# Patient Record
Sex: Male | Born: 1985 | Race: Black or African American | Hispanic: No | Marital: Single | Smoking: Never smoker
Health system: Southern US, Community
[De-identification: ages and names within clinical notes are randomized; demographics above are authoritative.]

## PROBLEM LIST (undated history)

## (undated) DIAGNOSIS — G4733 Obstructive sleep apnea (adult) (pediatric): Secondary | ICD-10-CM

## (undated) DIAGNOSIS — I5041 Acute combined systolic (congestive) and diastolic (congestive) heart failure: Secondary | ICD-10-CM

## (undated) DIAGNOSIS — E119 Type 2 diabetes mellitus without complications: Secondary | ICD-10-CM

## (undated) DIAGNOSIS — I10 Essential (primary) hypertension: Secondary | ICD-10-CM

## (undated) DIAGNOSIS — I5031 Acute diastolic (congestive) heart failure: Secondary | ICD-10-CM

## (undated) HISTORY — DX: Acute combined systolic (congestive) and diastolic (congestive) heart failure: I50.41

## (undated) HISTORY — PX: NO PAST SURGERIES: SHX2092

## (undated) HISTORY — DX: Acute diastolic (congestive) heart failure: I50.31

## (undated) HISTORY — DX: Obstructive sleep apnea (adult) (pediatric): G47.33

## (undated) HISTORY — DX: Morbid (severe) obesity due to excess calories: E66.01

---
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
## 2006-12-13 ENCOUNTER — Inpatient Hospital Stay (HOSPITAL_COMMUNITY): Admission: EM | Admit: 2006-12-13 | Discharge: 2006-12-14 | Payer: Self-pay | Admitting: Emergency Medicine

## 2010-09-11 ENCOUNTER — Encounter: Payer: Self-pay | Admitting: Family Medicine

## 2011-01-07 NOTE — Consult Note (Signed)
NAME:  Paul, Lamb NO.:  [id number]   MEDICAL RECORD NO.:  [id number]          PATIENT TYPE:  EMS   LOCATION:  MAJO                         FACILITY:  [facility]   PHYSICIAN:  Paul Lamb, M.D.  DATE OF BIRTH:  [birth year]   DATE OF CONSULTATION:  12/13/2006  DATE OF DISCHARGE:                                 CONSULTATION   REFERRING PHYSICIAN:  Markham Jordan L. Effie Shy, M.D.   HISTORY OF PRESENT ILLNESS:  Paul Lamb is a 25 year old right-  handed black male, born October 24, 1985, with a history of  hypertension in the past, treated 2 years ago, but never continued the  medication.  This patient was brought into the emergency room today for  onset of right-sided facial weakness that he noticed this morning.  The  patient noted 2 days ago some right ear pain and the day prior to  admission, had some alteration in taste.  The patient noted this morning  that he could not whistle and when he looked in the mirror, noted that  this face was asymmetric.  Patient comes to the hospital for further  evaluation, but blood pressures were noted to be greater than 200.  The  patient is being admitted for high blood pressure control.  Neurology  was called for Bell's palsy.  CT of the head was unremarkable.   PAST MEDICAL HISTORY:  1. History of severe hypertension.  2. Bell's palsy on the right.  3. Obesity.   MEDICATIONS:  The patient is on no medications.   ALLERGIES:  There are no known allergies.   HABITS:  He does not smoke or drink.   SOCIAL HISTORY:  [place], is single and is in college.  The patient has no children.   FAMILY MEDICAL HISTORY:  Father has severe early onset hypertension, on  hemodialysis with end-stage renal disease.  Mother has a history of  diabetes.  There is a history of heart disease on the father's side of  the family.   REVIEW OF SYSTEMS:  Notable for no fevers or chills.  The  patient denies  headache other than ear pain.  Denies any problems swallowing or  choking.  Denies neck pain, shortness of breath, chest pains, abdominal  pains, troubles with nausea or vomiting.  Denies any problems  controlling the bowels or bladder.  Denies any numbness or weakness on  the arms or legs.  Has not had any gait disturbance.  No dizziness or  blackout episodes.   PHYSICAL EXAMINATION:  VITALS:  Blood pressure is 186/108, heart rate  81, respiratory rate 20, temperature 99.6.  GENERAL:  This patient is a moderately obese black male who is alert and  cooperative at the time examination.  HEENT:  Head is atraumatic.  Eyes:  Pupils are equal, round and reactive  to light.  Disks are flat bilaterally.  Extraocular movements are full.  Visual fields are full.  Speech is well-enunciated and not aphasic.  CHEST:  The patient has clear lung fields.  CARDIOVASCULAR:  Examination reveals  a regular rate and rhythm with no  obvious murmurs or rubs noted.  EXTREMITIES:  Without significant edema.  NEUROLOGIC:  The patient appears to have some facial asymmetry,  decreased symmetry with smile, flattened nasolabial fold on the right.  The patient has weakness of the forehead muscles as well on the right,  has delayed eyelid closure with eye blink on the right.  Again,  extraocular movements are completely full.  No aphasia noted.  Tympanic  membranes are clear bilaterally.  Motor testing reveals 5/5 strength in  all 4's.  Good and symmetric motor and tone are noted throughout.  Sensory testing is intact to pinprick, soft touch and vibratory  sensation throughout.  Pinprick sensation on the face is symmetric and  normal.  Tongue strength is normal bilaterally.  Jaw strength is normal.  The patient has good finger-to-nose-to-finger and heel-to-shin.  Gait  was not tested.  Deep tendon reflexes are symmetric and normal.  Toes  are downgoing bilaterally.  No drift is seen.   LABORATORY  VALUES:  Values are notable for a urinalysis revealing a  specific gravity of 1.023, pH of 7.0, protein of the urine is 30 mg/dL,  0-2 white cells, 0-2 red cells; white count of 6.9, hemoglobin 15.9,  hematocrit 47.3, MCV of 71.3, platelets of 281,000.   IMAGING STUDY:  CT of the head is as above.   IMPRESSION:  1. Right Bell's palsy.  2. Hypertension.   This patient comes to the emergency room for the right Bell's palsy, but  is incidentally noted to have significant hypertension; I do not suspect  the 2 are related.  No specific treatment for the Bell's palsy is  recommended other than possibly using Valtrex at 500 mg q.12 h. for 5  days.  I would not use steroids.  I will be happy to see this patient  back at any time if needed.   Thank you very much.      Paul Lamb, M.D.  Electronically Signed     CKW/MEDQ  D:  12/13/2006  T:  12/14/2006  Job:  [id number]   cc:   [address], [place], [place] [facility]

## 2011-01-07 NOTE — H&P (Signed)
NAME:  Paul Lamb, Paul Lamb NO.:  [id number]   MEDICAL RECORD NO.:  [id number]          PATIENT TYPE:  EMS   LOCATION:  MAJO                         FACILITY:  [facility]   PHYSICIAN:  Michaelyn Barter, M.D. DATE OF BIRTH:  June 10, 1986   DATE OF ADMISSION:  12/13/2006  DATE OF DISCHARGE:                              HISTORY & PHYSICAL   PRIMARY CARE PHYSICIAN:  Dr. Renaye Rakers.   CHIEF COMPLAINT:  Right facial problem.   HISTORY OF PRESENT ILLNESS:  Mr. Covin is a 25 year old gentleman who  states that at approximately 8:00 this morning he developed problems  with moving the right side of his face. He states that he had  difficulties with regards to keeping both food and liquids inside his  mouth. Everything that he would take into his oral cavity would leak out  at the right side of his face. He states that some time later at  approximately 1:00 a.m. he went to have his blood pressure taken at a  local fire station. His blood pressure then was found to be 210/130. He  was told to come to the hospital for further evaluation. He complains  that his right ear feels watery. He also complained of a right-sided  earache that started a day or two ago. He denies having any visual  changes, nausea, vomiting, fevers or chills. No shortness of breath, no  chest pain. He states that he has never had any similar symptoms in the  past.   PAST MEDICAL HISTORY:  Questionable hypertension. The patient states  that he was told by his primary care doctor in the past that he may be  developing early hypertension.   PAST SURGICAL HISTORY:  Ingrown toenail removed from the left foot.   ALLERGIES:  None.   HOME MEDICATIONS:  None.   SOCIAL HISTORY:  The patient attends [employer]. Cigarettes never. Alcohol  never. Cocaine never.   FAMILY HISTORY:  Father has end-stage renal disease secondary to  hypertension and he is now on dialysis. His end-stage renal disease was  diagnosed at the age  of 5. Mother has issues with her blood pressure.  The father cannot remember the specifics. She also suffers from diabetes  mellitus.   REVIEW OF SYSTEMS:  As per HPI.   PHYSICAL EXAMINATION:  GENERAL:  The patient is awake, he is  cooperative. He does not demonstrated any obvious distress. He is a  morbidly obese appearing male.  HEENT:  Normocephalic, atraumatic. There is a slight right-sided facial  droop. Oral mucosa is pink, no thrush, no exudate.  VITAL SIGNS:  When the patient initially presented to the hospital his  temperature was 99.6, his blood pressure 125/89, heart rate 81,  respirations 20, O2 sat 98%. Approximately 20 minutes later, the  patient's blood pressure was noted to be 186/108.  NECK:  Supple, no JVD, no lymphadenopathy. Thyroid not palpable. Strong  carotid upstrokes bilaterally, no bruits are auscultated.  CARDIAC:  S1, S2 is present, regular rate and rhythm. No S3, no S4, no  murmur, gallop or rub.  RESPIRATORY:  No crackles or  wheezes.  ABDOMEN:  Soft, nontender, nondistended, positive bowel sounds. No  masses are palpated.  EXTREMITIES:  No leg edema.  NEUROLOGIC:  Alert and oriented x3. Cranial nerves II-XII are intact.  The patient does have slight right-sided droop at the corner of his  right mouth. When the patient smiles he has difficulty with regards to  raising the right side of his mouth, likewise the patient has decreased  extraocular strength with regards to his right eye. There is no focal  motor deficits with regards to the upper or lower extremities. The  patient has equal hand grip strength bilaterally. There is no pronator  drift.  MUSCULOSKELETAL:  5/5 upper and lower extremity strength.   LABORATORY DATA:  White blood cell count is 6.9, hemoglobin 15.9,  hematocrit 47.3, platelets 281. Total protein is 8.3, albumin 4.7,  calcium 9.9. PT 12.6, INR 0.9, PTT 28. Sodium 140, potassium 4.0,  chloride 103, CO2 26, BUN 6, creatinine 0.81,  glucose 87. Bilirubin  total 1.1, alkaline phosphatase 104. SGOT 30, SGPT 31, urinalysis  negative.   CT scan of the head was interpreted as being negative.   ASSESSMENT/PLAN:  1. Acute onset right-sided facial weakness. Given the fact that the      patient's head CT was negative, the fact that the patient is also a      very young gentleman, it appears that the patient may currently be      suffering from an acute onset of Bell's palsy. Neurology has seen      the patient and at this particular time the recommendation is to      start the patient on Valtrex. Will comply and start Valtrex as well      as prednisone.  2. Uncontrolled hypertension. Will start the patient on Norvasc for      now. Will titrate the dose up as needed. Will consider additional      blood pressure medications if the patient's blood pressure remains      uncontrolled.  3. GI prophylaxis. Will provide Protonix.      Michaelyn Barter, M.D.  Electronically Signed     OR/MEDQ  D:  12/13/2006  T:  12/13/2006  Job:  [id number]   cc:   Renaye Rakers, M.D.

## 2011-01-07 NOTE — Discharge Summary (Signed)
NAME:  SAVIER, TRICKETT NO.:  [id number]   MEDICAL RECORD NO.:  [id number]          PATIENT TYPE:  INP   LOCATION:  3015                         FACILITY:  [facility]   PHYSICIAN:  Michaelyn Barter, M.D. DATE OF BIRTH:  1985-12-19   DATE OF ADMISSION:  12/13/2006  DATE OF DISCHARGE:  12/14/2006                               DISCHARGE SUMMARY   PRIMARY CARE PHYSICIAN:  Renaye Rakers, M.D.   FINAL DIAGNOSES:  1. Right facial weakness secondary to Bell's palsy.  2. Very uncontrolled hypertension/hypertensive urgency.   CONSULTATIONS:  Marlan Palau, M.D. with neurology.   PROCEDURE:  CT scan of the head without contrast completed December 13, 2006.   HISTORY OF PRESENT ILLNESS:  Mr. Villamar is a 25 year old male who stated  that at approximately 8 o'clock in the morning he developed problems  moving the right side of his face.  Later that day, he went to have his  blood pressure checked and it was found to be 210/130.  He was told to  report to the hospital's emergency department for further evaluation  right away.   For past medical history and further details of the history and  physical, please see that dictated by Dr. Michaelyn Barter on December 13, 2006.   HOSPITAL COURSE:  Problem 1.  Right facial weakness/Bell's palsy.  A CAT  scan was completed of the patient's head without contrast on December 13, 2006. It was a negative noncontrast head CT.  There was no evidence of  intracranial hemorrhage, brain edema, acute infarct, or lesions.  Neurology was consulted.  Dr. Lesia Sago saw the patient.  He  indicated that the patient was suffering from right facial Bell's palsy  and that although the patient's blood pressure was significantly  elevated, there was no obvious relationship.  The patient was  subsequently started on Valtrex 500 mg q.12h.  He recommended that  steroids not be used on this patient.   Problem 2.  Uncontrolled hypertension.  The patient was  vague with  regards to whether or not he had ever been diagnosed with having high  blood pressure in the past.  He was not taking any medications for his  blood pressure prior to this hospitalization.  He was started on IV  labetalol on a p.r.n. basis along with Norvasc, initially 5 mg p.o.  daily.  His blood pressure remained somewhat labile.  He would have  blood pressure that ranged anywhere from the 120s systolically up to the  180s systolically, and likewise, his blood pressure diastolically went  from normotensive to hypertensive.  I indicated to the patient that he  would have to follow up with his primary care doctor for further control  and adjustments of his blood pressure medication.  On the date of  discharge the patient currently has no complaints, and he indicates that  he wants to go home. His vitals show that his temperature is 97.7, heart  rate 86, respirations 20, blood pressure 176/99, O2 saturation 95% on  room air.  The decision has been made to discharge the  patient home.   DISCHARGE MEDICATIONS:  1. Norvasc 10 mg p.o. daily.  2. Valtrex 500 mg one tablet p.o. b.i.d.   FOLLOW UP:  The patient will be instructed to follow up with Dr. Renaye Rakers on Dec 21, 2006 at 4:30 p.m.  I have called to confirm this  appointment.      Michaelyn Barter, M.D.  Electronically Signed     OR/MEDQ  D:  12/14/2006  T:  12/14/2006  Job:  [id number]   cc:   Renaye Rakers, M.D.

## 2011-05-24 ENCOUNTER — Emergency Department (HOSPITAL_COMMUNITY)
Admission: EM | Admit: 2011-05-24 | Discharge: 2011-05-25 | Disposition: A | Discharge status: Home / Self Care | Payer: No Typology Code available for payment source | Attending: Emergency Medicine | Admitting: Emergency Medicine

## 2011-05-24 DIAGNOSIS — I10 Essential (primary) hypertension: Secondary | ICD-10-CM | POA: Insufficient documentation

## 2011-05-24 DIAGNOSIS — T148XXA Other injury of unspecified body region, initial encounter: Secondary | ICD-10-CM | POA: Insufficient documentation

## 2011-05-24 DIAGNOSIS — T1490XA Injury, unspecified, initial encounter: Secondary | ICD-10-CM | POA: Insufficient documentation

## 2011-05-24 DIAGNOSIS — R51 Headache: Secondary | ICD-10-CM | POA: Insufficient documentation

## 2011-05-24 DIAGNOSIS — Y92410 Unspecified street and highway as the place of occurrence of the external cause: Secondary | ICD-10-CM | POA: Insufficient documentation

## 2011-09-28 ENCOUNTER — Ambulatory Visit: Payer: Self-pay

## 2011-09-29 ENCOUNTER — Ambulatory Visit: Payer: Self-pay | Admitting: Emergency Medicine

## 2011-09-29 VITALS — BP 152/110 | HR 84 | Temp 98.4°F | Resp 22 | Ht 72.0 in | Wt 348.2 lb

## 2011-09-29 DIAGNOSIS — Z0000 Encounter for general adult medical examination without abnormal findings: Secondary | ICD-10-CM

## 2011-09-29 DIAGNOSIS — Z0289 Encounter for other administrative examinations: Secondary | ICD-10-CM

## 2011-09-29 NOTE — Progress Notes (Signed)
  Subjective:    Patient ID: Paul Lamb, male    DOB: 25-Oct-1985, 25 y.o.   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    Review of Systems review of systems was positive for symptoms of sleep apnea with severe snoring and daytime fatigue and drowsiness.     Objective:   Physical Exam his general exam reveals a morbidly obese male in no acute distress. HEENT exam is within normal limits neck supple chest is clear heart regular rate no murmurs abdomen soft no tenderness. 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 His blood pressure is not under control he has signs and symptoms consistent with obstructive sleep apnea and has not had a sleep study done. He was given numbers for vocational rehabilitation and health services if he might be able to obtain a sleep study

## 2011-10-21 ENCOUNTER — Telehealth: Payer: Self-pay

## 2011-10-21 NOTE — Telephone Encounter (Signed)
.  [facility] PT WOULD LIKE TO GET A REFERRAL TO [facility] PLEASE CALL [phone]

## 2011-10-23 NOTE — Telephone Encounter (Signed)
PT STATED HE WOULD LIKE TO BE REFERRED TO [facility] FOR SLEEP STUDY.  PT STATED DR DAUB SPOKE ABOUT THIS LAST VISIT.

## 2011-10-23 NOTE — Telephone Encounter (Signed)
Patient needs office visit and exam to determine appropriate study to be ordered.

## 2011-10-24 ENCOUNTER — Other Ambulatory Visit: Payer: Self-pay | Admitting: Emergency Medicine

## 2011-10-24 DIAGNOSIS — Z9989 Dependence on other enabling machines and devices: Secondary | ICD-10-CM

## 2015-06-22 ENCOUNTER — Emergency Department (HOSPITAL_COMMUNITY)
Admission: EM | Admit: 2015-06-22 | Discharge: 2015-06-22 | Disposition: A | Discharge status: Home / Self Care | Payer: Self-pay | Attending: Emergency Medicine | Admitting: Emergency Medicine

## 2015-06-22 ENCOUNTER — Encounter (HOSPITAL_COMMUNITY): Payer: Self-pay | Admitting: Emergency Medicine

## 2015-06-22 DIAGNOSIS — S3992XA Unspecified injury of lower back, initial encounter: Secondary | ICD-10-CM | POA: Insufficient documentation

## 2015-06-22 DIAGNOSIS — Y92410 Unspecified street and highway as the place of occurrence of the external cause: Secondary | ICD-10-CM | POA: Insufficient documentation

## 2015-06-22 DIAGNOSIS — M545 Low back pain, unspecified: Secondary | ICD-10-CM

## 2015-06-22 DIAGNOSIS — I10 Essential (primary) hypertension: Secondary | ICD-10-CM | POA: Insufficient documentation

## 2015-06-22 DIAGNOSIS — Z79899 Other long term (current) drug therapy: Secondary | ICD-10-CM | POA: Insufficient documentation

## 2015-06-22 DIAGNOSIS — Y998 Other external cause status: Secondary | ICD-10-CM | POA: Insufficient documentation

## 2015-06-22 DIAGNOSIS — Y9389 Activity, other specified: Secondary | ICD-10-CM | POA: Insufficient documentation

## 2015-06-22 HISTORY — DX: Essential (primary) hypertension: I10

## 2015-06-22 MED ORDER — ACETAMINOPHEN 500 MG PO TABS
1000.0000 mg | ORAL_TABLET | Freq: Once | ORAL | Status: AC
  Administered 2015-06-22: 1000 mg via ORAL
  Filled 2015-06-22: qty 2

## 2015-06-22 MED ORDER — TRAMADOL HCL 50 MG PO TABS: 50.0000 mg | ORAL_TABLET | Freq: Four times a day (QID) | ORAL | Status: DC | PRN

## 2015-06-22 NOTE — Discharge Instructions (Signed)
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
## 2015-09-04 ENCOUNTER — Emergency Department (HOSPITAL_COMMUNITY)
Admission: EM | Admit: 2015-09-04 | Discharge: 2015-09-04 | Disposition: A | Discharge status: Home / Self Care | Payer: Self-pay | Attending: Emergency Medicine | Admitting: Emergency Medicine

## 2015-09-04 ENCOUNTER — Encounter (HOSPITAL_COMMUNITY): Payer: Self-pay | Admitting: Family Medicine

## 2015-09-04 DIAGNOSIS — Z760 Encounter for issue of repeat prescription: Secondary | ICD-10-CM | POA: Insufficient documentation

## 2015-09-04 DIAGNOSIS — Z79899 Other long term (current) drug therapy: Secondary | ICD-10-CM | POA: Insufficient documentation

## 2015-09-04 DIAGNOSIS — R7989 Other specified abnormal findings of blood chemistry: Secondary | ICD-10-CM | POA: Insufficient documentation

## 2015-09-04 DIAGNOSIS — Z0189 Encounter for other specified special examinations: Secondary | ICD-10-CM

## 2015-09-04 DIAGNOSIS — E669 Obesity, unspecified: Secondary | ICD-10-CM | POA: Insufficient documentation

## 2015-09-04 DIAGNOSIS — I10 Essential (primary) hypertension: Secondary | ICD-10-CM | POA: Insufficient documentation

## 2015-09-04 LAB — COMPREHENSIVE METABOLIC PANEL
ALT: 21 U/L (ref 17–63)
AST: 27 U/L (ref 15–41)
Albumin: 3.7 g/dL (ref 3.5–5.0)
Alkaline Phosphatase: 75 U/L (ref 38–126)
Anion gap: 9 (ref 5–15)
BUN: 11 mg/dL (ref 6–20)
CHLORIDE: 104 mmol/L (ref 101–111)
CO2: 26 mmol/L (ref 22–32)
CREATININE: 1.06 mg/dL (ref 0.61–1.24)
Calcium: 9.6 mg/dL (ref 8.9–10.3)
Glucose, Bld: 115 mg/dL — ABNORMAL HIGH (ref 65–99)
POTASSIUM: 4.4 mmol/L (ref 3.5–5.1)
SODIUM: 139 mmol/L (ref 135–145)
Total Bilirubin: 1 mg/dL (ref 0.3–1.2)
Total Protein: 6.9 g/dL (ref 6.5–8.1)

## 2015-09-04 MED ORDER — AMLODIPINE BESYLATE 5 MG PO TABS
10.0000 mg | ORAL_TABLET | Freq: Once | ORAL | Status: AC
  Administered 2015-09-04: 10 mg via ORAL
  Filled 2015-09-04: qty 2

## 2015-09-04 MED ORDER — LISINOPRIL 20 MG PO TABS: 20.0000 mg | ORAL_TABLET | Freq: Every day | ORAL | Status: DC

## 2015-09-04 MED ORDER — LOSARTAN POTASSIUM-HCTZ 100-12.5 MG PO TABS: 1.0000 | ORAL_TABLET | Freq: Every day | ORAL | Status: DC

## 2015-09-04 MED ORDER — LABETALOL HCL 100 MG PO TABS: 100.0000 mg | ORAL_TABLET | Freq: Two times a day (BID) | ORAL | Status: DC

## 2015-09-04 MED ORDER — AMLODIPINE BESYLATE 10 MG PO TABS: 10.0000 mg | ORAL_TABLET | Freq: Every day | ORAL | Status: DC

## 2015-09-04 NOTE — ED Notes (Signed)
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 sts that he had some protein in his urine and they want to make sure its from his BP and not impaired kidney function. sts he feels okay.

## 2015-09-04 NOTE — ED Provider Notes (Signed)
CSN: [id number]     Arrival date & time 09/04/15  1607 History  By signing my name below, I, Paul Lamb, attest that this documentation has been prepared under the direction and in the presence of Paul Lamb, [place]. Electronically Signed: Placido Lamb, ED Scribe. 09/04/2015. 7:35 PM.    Chief Complaint  Patient presents with  . needs kidney function test    The history is provided by the patient. No language interpreter was used.    HPI Comments: Paul Lamb is a 30 y.o. male who presents to the Emergency Department for a kidney function test. 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 Pt also requests an rx to have his HTN medications refilled further noting that he has not been taking his Norvasc recently due to having ran out of it. He denies any CP, SOB, hematuria, difficulty urinating, visual disturbance, headache, or any other symptoms at this time.    Past Medical History  Diagnosis Date  . Hypertension    History reviewed. No pertinent past surgical history. History reviewed. No pertinent family history. Social History  Substance Use Topics  . Smoking status: Never Smoker   . Smokeless tobacco: Never Used  . Alcohol Use: No    Review of Systems  All other systems reviewed and are negative.   Allergies  Review of patient's allergies indicates no known allergies.  Home Medications   Prior to Admission medications   Medication Sig Start Date End Date Taking? Authorizing Provider  amLODipine (NORVASC) 10 MG tablet Take 10 mg by mouth daily.    Historical Provider, MD  losartan-hydrochlorothiazide (HYZAAR) 100-12.5 MG per tablet Take 1 tablet by mouth daily.    Historical Provider, MD  traMADol (ULTRAM) 50 MG tablet Take 1 tablet (50 mg total) by mouth every 6 (six) hours as needed. 06/22/15   Eber Hong, MD   BP 160/105 mmHg  Pulse 94   Temp(Src) 98.6 F (37 C) (Oral)  Resp 14  SpO2 97%    Physical Exam  Constitutional: He is oriented to person, place, and time. He appears well-developed and well-nourished.  Obese, NAD  HENT:  Head: Normocephalic and atraumatic.  Neck: Normal range of motion.  Cardiovascular: Normal rate.   Pulmonary/Chest: Effort normal. No respiratory distress.  Abdominal: Soft.  Musculoskeletal: Normal range of motion.  Neurological: He is alert and oriented to person, place, and time.  Skin: Skin is warm and dry. He is not diaphoretic.  Psychiatric: He has a normal mood and affect. His behavior is normal.  Nursing note and vitals reviewed.  Filed Vitals:   09/04/15 1628 09/04/15 1922 09/04/15 1925 09/04/15 2029  BP: 160/105 185/113 200/105 168/101  Pulse: 94 95  90  Temp: 98.6 F (37 C) 97.9 F (36.6 C)    TempSrc: Oral Oral    Resp: SpO2: 97% 98%  96%     ED Course  Procedures  DIAGNOSTIC STUDIES: Oxygen Saturation is 97% on RA, normal by my interpretation.    COORDINATION OF CARE: 7:33 PM Discussed next steps with pt and he agreed to the plan.   Labs Review Labs Reviewed  COMPREHENSIVE METABOLIC PANEL - Abnormal; Notable for the following:    Glucose, Bld 115 (*)    All other components within normal limits    Imaging Review No results found.  I have personally reviewed and evaluated these lab results as part of my medical decision-making.   EKG Interpretation None      MDM   Final diagnoses:  Encounter for laboratory test  Essential hypertension  Encounter for medication refill    CMP unremarkable. Pt states he just needed note saying he does not have evidence of kidney disease. However his BP has been in elevated in the ED, which I suspect is secondary to running out of Norvasc at home. He states he otherwise has taken his medications as prescribed. Will give home dose of norvasc here and recheck. HE is otherwise asymptomatic. Anticipate d/c home  with rx for all his BP meds per his request. Will give resource guide to establish regular primary care.  BP improved with medication. Will d/c as above.  I personally performed the services described in this documentation, which was scribed in my presence. The recorded information has been reviewed and is accurate.   Carlene Coria, PA-C 09/04/15 2303  Marily Memos, MD 09/07/15 [phone]

## 2015-09-04 NOTE — Discharge Instructions (Signed)
Your bloodwork was normal today. Please continue taking all of your blood pressure medications as prescribed as your blood pressure was high in the emergency room. You should call one of the clinics listed below to establish primary care to get your blood pressure re-checked and for ongoing blood pressure management. Return to the ER for new or worsening symptoms.  Take medications as prescribed. Return to the emergency room for worsening condition or new concerning symptoms. Follow up with your regular doctor. If you don't have a regular doctor use one of the numbers below to establish a primary care doctor.   Emergency Department Resource Guide 1) Find a Doctor and Pay Out of Pocket Although you won't have to find out who is covered by your insurance plan, it is a good idea to ask around and get recommendations. You will then need to call the office and see if the doctor you have chosen will accept you as a new patient and what types of options they offer for patients who are self-pay. Some doctors offer discounts or will set up payment plans for their patients who do not have insurance, but you will need to ask so you aren't surprised when you get to your appointment.  2) Contact Your Local Health Department Not all health departments have doctors that can see patients for sick visits, but many do, so it is worth a call to see if yours does. If you don't know where your local health department is, you can check in your phone book. The CDC also has a tool to help you locate your state's health department, and many state websites also have listings of all of their local health departments.  3) Find a Walk-in Clinic If your illness is not likely to be very severe or complicated, you may want to try a walk in clinic. These are popping up all over the country in pharmacies, drugstores, and shopping centers. They're usually staffed by nurse practitioners or physician assistants that have been trained to  treat common illnesses and complaints. They're usually fairly quick and inexpensive. However, if you have serious medical issues or chronic medical problems, these are probably not your best option.  No Primary Care Doctor: - Call Health Connect at  [phone] - they can help you locate a primary care doctor that  accepts your insurance, provides certain services, etc. - Physician Referral [phone]  Emergency Department Resource Guide 1) Find a Doctor and Pay Out of Pocket Although you won't have to find out who is covered by your insurance plan, it is a good idea to ask around and get recommendations. You will then need to call the office and see if the doctor you have chosen will accept you as a new patient and what types of options they offer for patients who are self-pay. Some doctors offer discounts or will set up payment plans for their patients who do not have insurance, but you will need to ask so you aren't surprised when you get to your appointment.  2) Contact Your Local Health Department Not all health departments have doctors that can see patients for sick visits, but many do, so it is worth a call to see if yours does. If you don't know where your local health department is, you can check in your phone book. The CDC also has a tool to help you locate your state's health department, and many state websites also have listings of all of their local health departments.  3)  Find a Walk-in Clinic If your illness is not likely to be very severe or complicated, you may want to try a walk in clinic. These are popping up all over the country in pharmacies, drugstores, and shopping centers. They're usually staffed by nurse practitioners or physician assistants that have been trained to treat common illnesses and complaints. They're usually fairly quick and inexpensive. However, if you have serious medical issues or chronic medical problems, these are probably not your best option.  No  Primary Care Doctor: - Call Health Connect at  [phone] - they can help you locate a primary care doctor that  accepts your insurance, provides certain services, etc. - Physician Referral Service- [phone]  Chronic Pain Problems: Organization         Address  Phone   Notes  [facility]  [phone] Patients need to be referred by their primary care doctor.   Medication Assistance: Organization         Address  Phone   Notes  [facility] Medication [facility] [address] [place], [place] [address] [phone] --Must be a resident of [facility] -- Must have NO insurance coverage whatsoever (no Medicaid/ Medicare, etc.) -- The pt. MUST have a primary care doctor that directs their care regularly and follows them in the community   [facility]  [phone]   [employer]  [phone]    Agencies that provide inexpensive medical care: Organization         Address  Phone   Notes  [facility]  [phone]   [facility]    [phone]   [facility] [address] [place], [place] [address] [phone]   [facility] [address] [place] [place] [phone]   [facility]    [phone]   [facility]    [phone]   [facility]  [address], [place] Phone:  [phone], Fax:  [phone] Hours of Operation:  9 am - 6 pm, M-F.  Also accepts Medicaid/Medicare and self-pay.  [facility]  [address], [place] Phone: [phone], Fax: [phone]. Hours of Operation:  8:30 am - 5:30 pm, M-F.  Also accepts Medicaid and self-pay.  [facility] [place] [address], [place] Phone: [phone]   [facility] [address] Natasha Bence [place], [place] [phone], Ext. [phone] Mondays & Thursdays: 7-9 AM.  First 15 patients are seen on  a first come, first serve basis.    Medicaid-accepting [facility] Providers:  Organization         Address  Phone   Notes  [facility] [address], [place] [phone] Also accepts self-pay patients.  [facility] [address] Laurell Josephs [place], [place]  [phone]   [facility] [address], [place] [phone]   [facility] [address] [place] [phone]   Renaye Rakers [address], [place]   [phone] Only accepts [place] patients after they have their name applied to their card.   Self-Pay (no insurance) in [facility]:  Organization         Address  Phone   Notes  Sickle Cell Patients, [facility] [address] [place] [phone]   [facility] [address] [place], [place] [phone]   [facility] [place]  [address],  [phone]   [facility]/Dr. Osei-Bonsu  [address] [place] or [address], [place] [phone] Phone number for both [place] and [place] locations is the same.  [facility] [address], [place] [phone]   [facility] [address], [place] or [address] [phone]   [facility] [address], [place] [phone], phone; [phone], fax Sees patients 1st and 3rd Saturday of every month.  Must not qualify for public or private insurance (i.e. Medicaid, Medicare, [place] [employer] Choice, Veterans' Benefits)  Household income should be no more than 200% of the poverty level The clinic cannot treat you if you are pregnant or think you are pregnant  Sexually transmitted diseases are not treated at the clinic.     Hypertension Hypertension, commonly  called high blood pressure, is when the force of blood pumping through your arteries is too strong. Your arteries are the blood vessels that carry blood from your heart throughout your body. A blood pressure reading consists of a higher number over a lower number, such as 110/72. The higher number (systolic) is the pressure inside your arteries when your heart pumps. The lower number (diastolic) is the pressure inside your arteries when your heart relaxes. Ideally you want your blood pressure below 120/80. Hypertension forces your heart to work harder to pump blood. Your arteries may become narrow or stiff. Having untreated or uncontrolled hypertension can cause heart attack, stroke, kidney disease, and other problems. RISK FACTORS Some risk factors for high blood pressure are controllable. Others are not.  Risk factors you cannot control include:   Race. You may be at higher risk if you are African American.  Age. Risk increases with age.  Gender. Men are at higher risk than women before age 58 years. After age 57, women are at higher risk than men. Risk factors you can control include:  Not getting enough exercise or physical activity.  Being overweight.  Getting too much fat, sugar, calories, or salt in your diet.  Drinking too much alcohol. SIGNS AND SYMPTOMS Hypertension does not usually cause signs or symptoms. Extremely high blood pressure (hypertensive crisis) may cause headache, anxiety, shortness of breath, and nosebleed. DIAGNOSIS To check if you have hypertension, your health care provider will measure your blood pressure while you are seated, with your arm held at the level of your heart. It should be measured at least twice using the same arm. Certain conditions can cause a difference in blood pressure between your right and left arms. A blood pressure reading that is higher than normal on one occasion does not mean that you need treatment. If it is not clear whether you have  high blood pressure, you may be asked to return on a different day to have your blood pressure checked again. Or, you may be asked to monitor your blood pressure at home for 1 or more weeks. TREATMENT Treating high blood pressure includes making lifestyle changes and possibly taking medicine. Living a healthy lifestyle can help lower high blood pressure. You may need to change some of your habits. Lifestyle changes may include:  Following the DASH diet. This diet is high in fruits, vegetables, and whole  grains. It is low in salt, red meat, and added sugars.  Keep your sodium intake below 2,300 mg per day.  Getting at least 30-45 minutes of aerobic exercise at least 4 times per week.  Losing weight if necessary.  Not smoking.  Limiting alcoholic beverages.  Learning ways to reduce stress. Your health care provider may prescribe medicine if lifestyle changes are not enough to get your blood pressure under control, and if one of the following is true:  You are 41-47 years of age and your systolic blood pressure is above 140.  You are 67 years of age or older, and your systolic blood pressure is above 150.  Your diastolic blood pressure is above 90.  You have diabetes, and your systolic blood pressure is over 140 or your diastolic blood pressure is over 90.  You have kidney disease and your blood pressure is above 140/90.  You have heart disease and your blood pressure is above 140/90. Your personal target blood pressure may vary depending on your medical conditions, your age, and other factors. HOME CARE INSTRUCTIONS  Have your blood pressure rechecked as directed by your health care provider.   Take medicines only as directed by your health care provider. Follow the directions carefully. Blood pressure medicines must be taken as prescribed. The medicine does not work as well when you skip doses. Skipping doses also puts you at risk for problems.  Do not smoke.   Monitor your  blood pressure at home as directed by your health care provider. SEEK MEDICAL CARE IF:   You think you are having a reaction to medicines taken.  You have recurrent headaches or feel dizzy.  You have swelling in your ankles.  You have trouble with your vision. SEEK IMMEDIATE MEDICAL CARE IF:  You develop a severe headache or confusion.  You have unusual weakness, numbness, or feel faint.  You have severe chest or abdominal pain.  You vomit repeatedly.  You have trouble breathing. MAKE SURE YOU:   Understand these instructions.  Will watch your condition.  Will get help right away if you are not doing well or get worse.   This information is not intended to replace advice given to you by your health care provider. Make sure you discuss any questions you have with your health care provider.   Document Released: 08/08/2005 Document Revised: 12/23/2014 Document Reviewed: 05/31/2013 Elsevier Interactive Patient Education [employer]

## 2015-11-01 ENCOUNTER — Inpatient Hospital Stay (HOSPITAL_COMMUNITY)
Admission: EM | Admit: 2015-11-01 | Discharge: 2015-11-07 | DRG: 292 | Disposition: A | Discharge status: Home Health | Payer: Self-pay | Attending: Family Medicine | Admitting: Family Medicine

## 2015-11-01 ENCOUNTER — Encounter (HOSPITAL_COMMUNITY): Payer: Self-pay | Admitting: Emergency Medicine

## 2015-11-01 ENCOUNTER — Emergency Department (HOSPITAL_COMMUNITY): Payer: Self-pay

## 2015-11-01 DIAGNOSIS — R609 Edema, unspecified: Secondary | ICD-10-CM | POA: Insufficient documentation

## 2015-11-01 DIAGNOSIS — R17 Unspecified jaundice: Secondary | ICD-10-CM

## 2015-11-01 DIAGNOSIS — M79642 Pain in left hand: Secondary | ICD-10-CM

## 2015-11-01 DIAGNOSIS — R0789 Other chest pain: Secondary | ICD-10-CM

## 2015-11-01 DIAGNOSIS — R0609 Other forms of dyspnea: Secondary | ICD-10-CM | POA: Insufficient documentation

## 2015-11-01 DIAGNOSIS — I10 Essential (primary) hypertension: Secondary | ICD-10-CM | POA: Insufficient documentation

## 2015-11-01 DIAGNOSIS — Z6841 Body Mass Index (BMI) 40.0 and over, adult: Secondary | ICD-10-CM

## 2015-11-01 DIAGNOSIS — G4733 Obstructive sleep apnea (adult) (pediatric): Secondary | ICD-10-CM | POA: Diagnosis present

## 2015-11-01 DIAGNOSIS — N179 Acute kidney failure, unspecified: Secondary | ICD-10-CM | POA: Insufficient documentation

## 2015-11-01 DIAGNOSIS — J189 Pneumonia, unspecified organism: Secondary | ICD-10-CM

## 2015-11-01 DIAGNOSIS — R1012 Left upper quadrant pain: Secondary | ICD-10-CM

## 2015-11-01 DIAGNOSIS — Z9114 Patient's other noncompliance with medication regimen: Secondary | ICD-10-CM

## 2015-11-01 DIAGNOSIS — K5900 Constipation, unspecified: Secondary | ICD-10-CM | POA: Diagnosis present

## 2015-11-01 DIAGNOSIS — I501 Left ventricular failure: Secondary | ICD-10-CM | POA: Insufficient documentation

## 2015-11-01 DIAGNOSIS — Z79899 Other long term (current) drug therapy: Secondary | ICD-10-CM

## 2015-11-01 DIAGNOSIS — I313 Pericardial effusion (noninflammatory): Secondary | ICD-10-CM

## 2015-11-01 DIAGNOSIS — M7989 Other specified soft tissue disorders: Secondary | ICD-10-CM | POA: Diagnosis present

## 2015-11-01 DIAGNOSIS — R059 Cough, unspecified: Secondary | ICD-10-CM

## 2015-11-01 DIAGNOSIS — R7989 Other specified abnormal findings of blood chemistry: Secondary | ICD-10-CM

## 2015-11-01 DIAGNOSIS — I3139 Other pericardial effusion (noninflammatory): Secondary | ICD-10-CM

## 2015-11-01 DIAGNOSIS — I161 Hypertensive emergency: Secondary | ICD-10-CM

## 2015-11-01 DIAGNOSIS — I110 Hypertensive heart disease with heart failure: Principal | ICD-10-CM | POA: Diagnosis present

## 2015-11-01 DIAGNOSIS — I119 Hypertensive heart disease without heart failure: Secondary | ICD-10-CM | POA: Insufficient documentation

## 2015-11-01 DIAGNOSIS — J40 Bronchitis, not specified as acute or chronic: Secondary | ICD-10-CM

## 2015-11-01 DIAGNOSIS — R9431 Abnormal electrocardiogram [ECG] [EKG]: Secondary | ICD-10-CM | POA: Insufficient documentation

## 2015-11-01 DIAGNOSIS — R05 Cough: Secondary | ICD-10-CM

## 2015-11-01 DIAGNOSIS — R600 Localized edema: Secondary | ICD-10-CM

## 2015-11-01 DIAGNOSIS — R748 Abnormal levels of other serum enzymes: Secondary | ICD-10-CM | POA: Diagnosis present

## 2015-11-01 DIAGNOSIS — Z8249 Family history of ischemic heart disease and other diseases of the circulatory system: Secondary | ICD-10-CM

## 2015-11-01 DIAGNOSIS — R0602 Shortness of breath: Secondary | ICD-10-CM | POA: Insufficient documentation

## 2015-11-01 DIAGNOSIS — I5033 Acute on chronic diastolic (congestive) heart failure: Secondary | ICD-10-CM | POA: Diagnosis present

## 2015-11-01 LAB — URINALYSIS, ROUTINE W REFLEX MICROSCOPIC
Bilirubin Urine: NEGATIVE
GLUCOSE, UA: NEGATIVE mg/dL
Hgb urine dipstick: NEGATIVE
KETONES UR: NEGATIVE mg/dL
Leukocytes, UA: NEGATIVE
NITRITE: NEGATIVE
PH: 6.5 (ref 5.0–8.0)
Specific Gravity, Urine: 1.04 — ABNORMAL HIGH (ref 1.005–1.030)

## 2015-11-01 LAB — CBC WITH DIFFERENTIAL/PLATELET
BASOS ABS: 0.1 10*3/uL (ref 0.0–0.1)
Basophils Relative: 1 %
EOS ABS: 0.1 10*3/uL (ref 0.0–0.7)
Eosinophils Relative: 1 %
HEMATOCRIT: 39.3 % (ref 39.0–52.0)
HEMOGLOBIN: 13.3 g/dL (ref 13.0–17.0)
LYMPHS PCT: 22 %
Lymphs Abs: 1.9 10*3/uL (ref 0.7–4.0)
MCH: 22.6 pg — ABNORMAL LOW (ref 26.0–34.0)
MCHC: 33.8 g/dL (ref 30.0–36.0)
MCV: 66.7 fL — ABNORMAL LOW (ref 78.0–100.0)
MONOS PCT: 7 %
Monocytes Absolute: 0.6 10*3/uL (ref 0.1–1.0)
NEUTROS ABS: 6.1 10*3/uL (ref 1.7–7.7)
NEUTROS PCT: 69 %
Platelets: 304 10*3/uL (ref 150–400)
RBC: 5.89 MIL/uL — ABNORMAL HIGH (ref 4.22–5.81)
RDW: 15.6 % — ABNORMAL HIGH (ref 11.5–15.5)
WBC: 8.8 10*3/uL (ref 4.0–10.5)

## 2015-11-01 LAB — COMPREHENSIVE METABOLIC PANEL
ALBUMIN: 3.2 g/dL — AB (ref 3.5–5.0)
ALT: 20 U/L (ref 17–63)
ANION GAP: 12 (ref 5–15)
AST: 23 U/L (ref 15–41)
Alkaline Phosphatase: 69 U/L (ref 38–126)
BUN: 13 mg/dL (ref 6–20)
CALCIUM: 9.2 mg/dL (ref 8.9–10.3)
CO2: 26 mmol/L (ref 22–32)
Chloride: 102 mmol/L (ref 101–111)
Creatinine, Ser: 1.26 mg/dL — ABNORMAL HIGH (ref 0.61–1.24)
GFR calc non Af Amer: >60 mL/min (ref >60)
GLUCOSE: 122 mg/dL — AB (ref 65–99)
POTASSIUM: 4.9 mmol/L (ref 3.5–5.1)
SODIUM: 140 mmol/L (ref 135–145)
TOTAL PROTEIN: 6.8 g/dL (ref 6.5–8.1)
Total Bilirubin: 1.8 mg/dL — ABNORMAL HIGH (ref 0.3–1.2)

## 2015-11-01 LAB — I-STAT TROPONIN, ED: TROPONIN I, POC: 0.08 ng/mL (ref 0.00–0.08)

## 2015-11-01 LAB — URINE MICROSCOPIC-ADD ON

## 2015-11-01 LAB — D-DIMER, QUANTITATIVE (NOT AT ARMC): D DIMER QUANT: 2.03 ug{FEU}/mL — AB (ref 0.00–0.50)

## 2015-11-01 LAB — I-STAT CG4 LACTIC ACID, ED: LACTIC ACID, VENOUS: 0.75 mmol/L (ref 0.5–2.0)

## 2015-11-01 LAB — STREP PNEUMONIAE URINARY ANTIGEN: Strep Pneumo Urinary Antigen: NEGATIVE

## 2015-11-01 LAB — TROPONIN I
TROPONIN I: 0.09 ng/mL — AB (ref <0.031)
TROPONIN I: 0.07 ng/mL — AB (ref <0.031)

## 2015-11-01 LAB — LIPASE, BLOOD: LIPASE: 24 U/L (ref 11–51)

## 2015-11-01 LAB — BRAIN NATRIURETIC PEPTIDE: B Natriuretic Peptide: 300.7 pg/mL — ABNORMAL HIGH (ref 0.0–100.0)

## 2015-11-01 MED ORDER — IOHEXOL 350 MG/ML SOLN
100.0000 mL | Freq: Once | INTRAVENOUS | Status: AC | PRN
  Administered 2015-11-01: 100 mL via INTRAVENOUS

## 2015-11-01 MED ORDER — FAMOTIDINE 20 MG PO TABS: 20.0000 mg | ORAL_TABLET | Freq: Every day | ORAL | Status: DC

## 2015-11-01 MED ORDER — LEVOFLOXACIN 500 MG PO TABS
750.0000 mg | ORAL_TABLET | ORAL | Status: DC
  Administered 2015-11-02 – 2015-11-05 (×4): 750 mg via ORAL
  Filled 2015-11-01 (×4): qty 2

## 2015-11-01 MED ORDER — LEVOFLOXACIN 750 MG PO TABS: 750.0000 mg | ORAL_TABLET | ORAL | Status: DC

## 2015-11-01 MED ORDER — MORPHINE SULFATE (PF) 4 MG/ML IV SOLN
4.0000 mg | Freq: Once | INTRAVENOUS | Status: AC
  Administered 2015-11-01: 4 mg via INTRAVENOUS
  Filled 2015-11-01: qty 1

## 2015-11-01 MED ORDER — LABETALOL HCL 5 MG/ML IV SOLN
20.0000 mg | Freq: Once | INTRAVENOUS | Status: AC
  Administered 2015-11-01: 20 mg via INTRAVENOUS

## 2015-11-01 MED ORDER — HYDROCHLOROTHIAZIDE 12.5 MG PO CAPS
12.5000 mg | ORAL_CAPSULE | Freq: Every day | ORAL | Status: DC
  Administered 2015-11-01: 12.5 mg via ORAL
  Filled 2015-11-01: qty 1

## 2015-11-01 MED ORDER — LABETALOL HCL 100 MG PO TABS
100.0000 mg | ORAL_TABLET | Freq: Two times a day (BID) | ORAL | Status: DC
  Administered 2015-11-01 – 2015-11-03 (×4): 100 mg via ORAL
  Filled 2015-11-01 (×7): qty 1

## 2015-11-01 MED ORDER — AMLODIPINE BESYLATE 5 MG PO TABS
10.0000 mg | ORAL_TABLET | Freq: Once | ORAL | Status: AC
  Administered 2015-11-01: 10 mg via ORAL
  Filled 2015-11-01: qty 2

## 2015-11-01 MED ORDER — ONDANSETRON HCL 4 MG PO TABS: 4.0000 mg | ORAL_TABLET | Freq: Four times a day (QID) | ORAL | Status: DC | PRN

## 2015-11-01 MED ORDER — DEXTROSE 5 % IV SOLN
1.0000 g | Freq: Once | INTRAVENOUS | Status: AC
  Administered 2015-11-01: 1 g via INTRAVENOUS
  Filled 2015-11-01: qty 10

## 2015-11-01 MED ORDER — ACETAMINOPHEN 650 MG RE SUPP: 650.0000 mg | Freq: Four times a day (QID) | RECTAL | Status: DC | PRN

## 2015-11-01 MED ORDER — ENOXAPARIN SODIUM 100 MG/ML ~~LOC~~ SOLN
90.0000 mg | SUBCUTANEOUS | Status: DC
  Administered 2015-11-02 – 2015-11-06 (×6): 90 mg via SUBCUTANEOUS
  Filled 2015-11-01 (×6): qty 0.9
  Filled 2015-11-01: qty 1
  Filled 2015-11-01: qty 0.9

## 2015-11-01 MED ORDER — GUAIFENESIN ER 600 MG PO TB12: 600.0000 mg | ORAL_TABLET | Freq: Two times a day (BID) | ORAL | Status: DC | PRN

## 2015-11-01 MED ORDER — ONDANSETRON HCL 4 MG/2ML IJ SOLN: 4.0000 mg | Freq: Four times a day (QID) | INTRAMUSCULAR | Status: DC | PRN

## 2015-11-01 MED ORDER — LISINOPRIL 20 MG PO TABS
20.0000 mg | ORAL_TABLET | Freq: Every day | ORAL | Status: DC
  Administered 2015-11-01 – 2015-11-07 (×7): 20 mg via ORAL
  Filled 2015-11-01 (×7): qty 1

## 2015-11-01 MED ORDER — HYDROCHLOROTHIAZIDE 25 MG PO TABS
12.5000 mg | ORAL_TABLET | Freq: Once | ORAL | Status: AC
  Administered 2015-11-01: 12.5 mg via ORAL
  Filled 2015-11-01: qty 1

## 2015-11-01 MED ORDER — LOSARTAN POTASSIUM 50 MG PO TABS: 100.0000 mg | ORAL_TABLET | Freq: Once | ORAL | Status: DC

## 2015-11-01 MED ORDER — FAMOTIDINE 20 MG PO TABS
20.0000 mg | ORAL_TABLET | Freq: Every day | ORAL | Status: DC
  Administered 2015-11-01 – 2015-11-06 (×6): 20 mg via ORAL
  Filled 2015-11-01 (×6): qty 1

## 2015-11-01 MED ORDER — LISINOPRIL 20 MG PO TABS
20.0000 mg | ORAL_TABLET | Freq: Once | ORAL | Status: AC
  Administered 2015-11-01: 20 mg via ORAL
  Filled 2015-11-01: qty 1

## 2015-11-01 MED ORDER — AMLODIPINE BESYLATE 10 MG PO TABS
10.0000 mg | ORAL_TABLET | Freq: Every day | ORAL | Status: DC
  Administered 2015-11-01 – 2015-11-07 (×7): 10 mg via ORAL
  Filled 2015-11-01 (×7): qty 1

## 2015-11-01 MED ORDER — LABETALOL HCL 5 MG/ML IV SOLN
10.0000 mg | Freq: Once | INTRAVENOUS | Status: DC
  Filled 2015-11-01: qty 4

## 2015-11-01 MED ORDER — AMLODIPINE BESYLATE 10 MG PO TABS: 10.0000 mg | ORAL_TABLET | Freq: Every day | ORAL | Status: DC

## 2015-11-01 MED ORDER — LOSARTAN POTASSIUM-HCTZ 100-12.5 MG PO TABS: 1.0000 | ORAL_TABLET | Freq: Every day | ORAL | Status: DC

## 2015-11-01 MED ORDER — ACETAMINOPHEN 325 MG PO TABS
650.0000 mg | ORAL_TABLET | Freq: Four times a day (QID) | ORAL | Status: DC | PRN
  Administered 2015-11-01 – 2015-11-02 (×3): 650 mg via ORAL
  Filled 2015-11-01 (×3): qty 2

## 2015-11-01 MED ORDER — HYDROCODONE-ACETAMINOPHEN 5-325 MG PO TABS: 1.0000 | ORAL_TABLET | ORAL | Status: DC | PRN

## 2015-11-01 MED ORDER — LOSARTAN POTASSIUM 50 MG PO TABS
100.0000 mg | ORAL_TABLET | Freq: Every day | ORAL | Status: DC
  Administered 2015-11-01: 100 mg via ORAL
  Filled 2015-11-01: qty 2

## 2015-11-01 MED ORDER — DEXTROSE 5 % IV SOLN
500.0000 mg | Freq: Once | INTRAVENOUS | Status: AC
  Administered 2015-11-01: 500 mg via INTRAVENOUS
  Filled 2015-11-01: qty 500

## 2015-11-01 MED ORDER — HYDRALAZINE HCL 20 MG/ML IJ SOLN
10.0000 mg | INTRAMUSCULAR | Status: DC | PRN
  Administered 2015-11-02: 10 mg via INTRAVENOUS
  Filled 2015-11-01: qty 1

## 2015-11-01 NOTE — ED Notes (Signed)
Floor ordered a bariatric bed for pt and will notify us when it is ready.

## 2015-11-01 NOTE — H&P (Signed)
[facility] History and Physical  Bautista Spiegler Foucher SKA:[id number] DOB: 1985-11-01 DOA: 11/01/2015  Referring physician: Emergency Department PCP: No PCP Per Patient   CHIEF COMPLAINT: left hand pain / swelling, chest tightness / cough / bilateral leg swelling                  HPI: Paul Lamb is a 30 y.o. male presenting to ED with two days worth of intermittent LUQ burning, left hand pain and swelling, cough, chest tightness and lower extremity edema. He is on multiple medications for HTN but BP markedly elevated. He isn't compliant with low salt diet, doesn't have a PCP.   LUQ burning not related to meals. 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 No nausea. BMs normal. Hasn't tried anything for the discomfort  Patient thinks he hit back of left hand on truck. This morning hand was swollen to point that he couldn't make a fist but this is improving.   Patient reports a cough. Feels congested but can't pull anything up. No fevers or chills. His chest is tight but no actual chest pain.       ED COURSE:           Labs:    BNP 300 Lactic acid 0.75, troponin 0.0 Creatinine 1.26 Total bilirubin 1.8  EKG:    Sinus tachycardia Possible Left atrial enlargement Left axis deviation Abnormal ECG          Vent rate 112  Medications  azithromycin (ZITHROMAX) 500 mg in dextrose 5 % 250 mL IVPB (not administered)  morphine 4 MG/ML injection 4 mg (4 mg Intravenous Given 11/01/15 1155)  lisinopril (PRINIVIL,ZESTRIL) tablet 20 mg (20 mg Oral Given 11/01/15 1158)  labetalol (NORMODYNE,TRANDATE) injection 20 mg (20 mg Intravenous Given 11/01/15 1109)  amLODipine (NORVASC) tablet 10 mg (10 mg Oral Given 11/01/15 1156)  hydrochlorothiazide (HYDRODIURIL) tablet 12.5 mg (12.5 mg Oral Given 11/01/15 1156)  cefTRIAXone (ROCEPHIN) 1 g in dextrose 5 % 50 mL IVPB (1 g Intravenous New Bag/Given 11/01/15 1257)  iohexol (OMNIPAQUE) 350 MG/ML injection 100 mL (100 mLs Intravenous Contrast Given  11/01/15 1215)    Review of Systems  Constitutional: Negative.   HENT: Negative.   Eyes: Negative.   Respiratory: Positive for cough.   Cardiovascular: Positive for leg swelling.  Gastrointestinal: Negative.   Genitourinary: Negative.   Musculoskeletal: Negative.   Skin: Negative.   Neurological: Negative.   Endo/Heme/Allergies: Negative.   Psychiatric/Behavioral: Negative.     Past Medical History  Diagnosis Date  . Hypertension    History reviewed. No pertinent past surgical history.  SOCIAL HISTORY:  reports that he has never smoked. He has never used smokeless tobacco. He reports that he does not drink alcohol. His drug history is not on file. Lives:  At home with wife   Assistive devices:   None needed for ambulation.   No Known Allergies  FMH: Mother had heart disease, hypertension  Prior to Admission medications   Medication Sig Start Date End Date Taking? Authorizing Provider  amLODipine (NORVASC) 10 MG tablet Take 10 mg by mouth daily.   Yes Historical Provider, MD  guaiFENesin (MUCINEX) 600 MG 12 hr tablet Take 600 mg by mouth 2 (two) times daily as needed.   Yes Historical Provider, MD  labetalol (NORMODYNE) 100 MG tablet Take 100 mg by mouth 2 (two) times daily.   Yes Historical Provider, MD  lisinopril (PRINIVIL,ZESTRIL) 20 MG tablet Take 20 mg by mouth daily.  Yes Historical Provider, MD  losartan-hydrochlorothiazide (HYZAAR) 100-12.5 MG per tablet Take 1 tablet by mouth daily.   Yes Historical Provider, MD  Multiple Vitamins-Minerals (AIRBORNE) TBEF Take 1 tablet by mouth daily as needed (for immune).   Yes Historical Provider, MD  Pseudoephedrine-APAP-DM (DAYQUIL MULTI-SYMPTOM COLD/FLU PO) Take 2 capsules by mouth daily as needed (for cold).   Yes Historical Provider, MD  amLODipine (NORVASC) 10 MG tablet Take 1 tablet (10 mg total) by mouth daily. 09/04/15   Ace Gins Sam, PA-C  labetalol (NORMODYNE) 100 MG tablet Take 1 tablet (100 mg total) by mouth 2 (two)  times daily. 09/04/15   Ace Gins Sam, PA-C  lisinopril (PRINIVIL,ZESTRIL) 20 MG tablet Take 1 tablet (20 mg total) by mouth daily. 09/04/15   Ace Gins Sam, PA-C  losartan-hydrochlorothiazide (HYZAAR) 100-12.5 MG tablet Take 1 tablet by mouth daily. 09/04/15   Ace Gins Sam, PA-C  traMADol (ULTRAM) 50 MG tablet Take 1 tablet (50 mg total) by mouth every 6 (six) hours as needed. 06/22/15   Eber Hong, MD   PHYSICAL EXAM: Filed Vitals:   11/01/15 1107 11/01/15 1142 11/01/15 1156 11/01/15 1158  BP: 178/136 155/113 131/102 131/102  Pulse:  90    Temp:      TempSrc:      Resp:  19    Height:      Weight:      SpO2:  100%      Wt Readings from Last 3 Encounters:  11/01/15 193.828 kg (427 lb 5 oz)  06/22/15 162.388 kg (358 lb)  09/29/11 157.942 kg (348 lb 3.2 oz)    General:  Pleasant morbidly obese black male. Appears calm and comfortable Eyes: PER, normal lids, irises & conjunctiva ENT: grossly normal hearing, lips & tongue Neck: no LAD, no masses Cardiovascular: RRR, no murmurs. Massive pitting edema of BLE extremities. Left hand edematous without trauma.  Respiratory: Respirations even and unlabored. Normal respiratory effort. Lungs CTA bilaterally, no wheezes / rales .   Abdomen: soft, non-distended, non-tender, active bowel sounds. No obvious masses.  Skin: no rash seen on limited exam Musculoskeletal: grossly normal tone BUE/BLE Psychiatric: grossly normal mood and affect, speech fluent and appropriate Neurologic: grossly non-focal.         LABS ON ADMISSION:    Basic Metabolic Panel:  Recent Labs Lab 11/01/15 1043  NA 140  K 4.9  CL 102  CO2 26  GLUCOSE 122*  BUN 13  CREATININE 1.26*  CALCIUM 9.2   Liver Function Tests:  Recent Labs Lab 11/01/15 1043  AST 23  ALT 20  ALKPHOS 69  BILITOT 1.8*  PROT 6.8  ALBUMIN 3.2*    CBC:  Recent Labs Lab 11/01/15 1043  WBC 8.8  NEUTROABS 6.1  HGB 13.3  HCT 39.3  MCV 66.7*  PLT 304    CREATININE: 1.26 mg/dL  ABNORMAL (16/10/96 0454) Estimated creatinine clearance - 156.5 mL/min  Radiological Exams on Admission: Dg Chest 2 View  11/01/2015  CLINICAL DATA:  30 year old male with history of central non radiating chest pain and shortness breath for the past 3 days. Cough. EXAM: CHEST  2 VIEW COMPARISON:  Chest x-ray 05/23/2004. FINDINGS: Diffuse peribronchial cuffing. Patchy airspace disease predominantly throughout the right mid to lower lung. Left lung appears relatively clear. No pneumothorax. No suspicious appearing pulmonary nodules or masses. No evidence of pulmonary edema. Heart size is moderately enlarged (new compared to the prior study). Mild elevation of the right hemidiaphragm. Upper mediastinal contours are within normal  limits. IMPRESSION: 1. Peribronchial cuffing with patchy airspace disease throughout the right mid to lower lung, concerning for bronchitis with multilobar bronchopneumonia, most evident in the right middle and lower lobes. 2. In addition, however, there is also now moderate cardiomegaly which is new compared to the prior study. In this age group, clinical correlation for nonischemic cardiomyopathy is recommended, such as viral myocarditis. Electronically Signed   By: Trudie Reed M.D.   On: 11/01/2015 11:32   Ct Angio Chest Pe W/cm &/or Wo Cm  11/01/2015  CLINICAL DATA:  Shortness of breath, abdominal pain. Evaluate for pulmonary embolus or dissection. EXAM: CT ANGIOGRAPHY CHEST, ABDOMEN AND PELVIS TECHNIQUE: Multidetector CT imaging through the chest, abdomen and pelvis was performed using the standard protocol during bolus administration of intravenous contrast. Multiplanar reconstructed images and MIPs were obtained and reviewed to evaluate the vascular anatomy. CONTRAST:  OMNIPAQUE IOHEXOL 350 MG/ML SOLN COMPARISON:  None. FINDINGS: CTA CHEST FINDINGS Mediastinum/Nodes: There is cardiomegaly. Small pericardial effusion. Aorta is normal caliber. No dissection. No filling  defects in the pulmonary arteries to suggest pulmonary emboli. No mediastinal, hilar, or axillary adenopathy. Lungs/Pleura: Mild hazy opacities within the lungs could reflect early edema. Tree-in-bud densities are noted in the inferior right upper lobe and within the right lower lobe concerning for early bronchopneumonia. No confluent opacity on the left. No effusions. Musculoskeletal: Chest wall soft tissues are unremarkable. No acute bony abnormality. Review of the MIP images confirms the above findings. CTA ABDOMEN AND PELVIS FINDINGS Hepatobiliary: Unremarkable appearance.  Gallbladder unremarkable. Pancreas: Unremarkable appearance Spleen: Unremarkable appearance Adrenals/Urinary Tract: No adrenal abnormality. No focal renal abnormality. No stones or hydronephrosis. Urinary bladder is unremarkable. Stomach/Bowel: Normal appendix. Stomach, large and small bowel grossly unremarkable. Vascular/Lymphatic: No evidence of aneurysm or adenopathy. No evidence of dissection. Mesenteric vessels and single renal arteries are widely patent bilaterally. Reproductive: No abnormality. Other: No free fluid or free air. Musculoskeletal: No acute bony abnormality. Review of the MIP images confirms the above findings. IMPRESSION: No evidence of pulmonary embolus. No evidence of aortic aneurysm or dissection. Cardiomegaly, small pericardial effusion. Mild ground-glass opacities within the lungs could reflect early edema. Or focal tree-in-bud densities within the inferior right upper lobe and adjacent right lower lobe. This could reflect early bronchopneumonia. No acute findings in the abdomen or pelvis. Electronically Signed   By: Charlett Nose M.D.   On: 11/01/2015 12:58   Ct Abdomen Pelvis W Contrast  11/01/2015  CLINICAL DATA:  Shortness of breath, abdominal pain. Evaluate for pulmonary embolus or dissection. EXAM: CT ANGIOGRAPHY CHEST, ABDOMEN AND PELVIS TECHNIQUE: Multidetector CT imaging through the chest, abdomen and  pelvis was performed using the standard protocol during bolus administration of intravenous contrast. Multiplanar reconstructed images and MIPs were obtained and reviewed to evaluate the vascular anatomy. CONTRAST:  OMNIPAQUE IOHEXOL 350 MG/ML SOLN COMPARISON:  None. FINDINGS: CTA CHEST FINDINGS Mediastinum/Nodes: There is cardiomegaly. Small pericardial effusion. Aorta is normal caliber. No dissection. No filling defects in the pulmonary arteries to suggest pulmonary emboli. No mediastinal, hilar, or axillary adenopathy. Lungs/Pleura: Mild hazy opacities within the lungs could reflect early edema. Tree-in-bud densities are noted in the inferior right upper lobe and within the right lower lobe concerning for early bronchopneumonia. No confluent opacity on the left. No effusions. Musculoskeletal: Chest wall soft tissues are unremarkable. No acute bony abnormality. Review of the MIP images confirms the above findings. CTA ABDOMEN AND PELVIS FINDINGS Hepatobiliary: Unremarkable appearance.  Gallbladder unremarkable. Pancreas: Unremarkable appearance Spleen: Unremarkable appearance  Adrenals/Urinary Tract: No adrenal abnormality. No focal renal abnormality. No stones or hydronephrosis. Urinary bladder is unremarkable. Stomach/Bowel: Normal appendix. Stomach, large and small bowel grossly unremarkable. Vascular/Lymphatic: No evidence of aneurysm or adenopathy. No evidence of dissection. Mesenteric vessels and single renal arteries are widely patent bilaterally. Reproductive: No abnormality. Other: No free fluid or free air. Musculoskeletal: No acute bony abnormality. Review of the MIP images confirms the above findings. IMPRESSION: No evidence of pulmonary embolus. No evidence of aortic aneurysm or dissection. Cardiomegaly, small pericardial effusion. Mild ground-glass opacities within the lungs could reflect early edema. Or focal tree-in-bud densities within the inferior right upper lobe and adjacent right lower  lobe. This could reflect early bronchopneumonia. No acute findings in the abdomen or pelvis. Electronically Signed   By: Charlett Nose M.D.   On: 11/01/2015 12:58    ASSESSMENT / PLAN   Hypertensive emergency with chest tightness and mild bump in Cr compared to baseline. Presented with BP of 231/176. Transient improvement in BP in the ED after taking home antihypertensives but BP rising again. Normal troponin. CT chest negative for PE.  -admit to Observation -Medical bed -cycle troponins -Continue home medication including Norvasc, Hydrodiurl, Prinivil, Cozaar and labetalol). Monitor renal      function closely.  -Hydralazine  IV Q6 hours prn -Patient needs to establish care with a PCP. ED refilled his home meds last time.    Peripheral edema / cardiomegaly / elevated BNP.  -obtain echo.  -lasix  IV BID. -Nutritional consult, doesn't follow low sodium diet. -strict I and 0 -daily wts    Cough, nonproductive (feels congested but can't get sputum up).  Possible early bronchopneumonia on imaging. Not hypoxic -Rocephin and zithromax started in ED. Will change to Levaquin PO -mucinex bid -CAP order set utilized  Intermittent burning LUQ x 2 days. Etiology unclear. Abdominal exam not really concerning. No vomiting / weight loss or other alarm features. No acute findings on  CTA of abdomen  -trial of Pepcid  Left hand pain and swelling.  No obvious trauma but patient believes he hit it on truck. Initially patient complained of swelling in both hands.  -Swelling improving per patient. Reassess in am if left hand still swollen can get xray  Morbid obesity (HCC). -needs dietary consult      CONSULTANTS:  none    Code Status:  full code DVT Prophylaxis: Lovenox Family Communication:  Patient alert, oriented and understands plan of care.  Disposition Plan: Discharge to home in 24-48 hours   Time spent: 60 minutes Willette Cluster  NP [facility] Pager [phone]

## 2015-11-01 NOTE — ED Provider Notes (Signed)
CSN: [id number]     Arrival date & time 11/01/15  1016 History   First MD Initiated Contact with Patient 11/01/15 1037     Chief Complaint  Patient presents with  . Abdominal Pain  . Hand Pain     (Consider location/radiation/quality/duration/timing/severity/associated sxs/prior Treatment) HPI Comments: Paul Lamb is a 30 y.o. male with a PMHx of HTN, who presents to the ED with multiple complaints. His initial complaint was left upper quadrant abdominal pain 5 days, which he describes as 5/10 intermittent nonradiating soreness in the left upper quadrant, worse with sitting, with no treatments tried prior to arrival. He also complained of bilateral hand pain that started this morning, no known trauma/injury aside from using his hands to roll the window down on his truck and perhaps hitting the dorsum of the hand on the door. Additionally he complains of cough which is productive but he is unsure what color the sputum is, which has been present for 1 month. He states he has chest tightness when he coughs, but no chest pain at rest and no ongoing chest pain currently. Associated with this is dyspnea on exertion. He also states that he has bilateral leg swelling which has been present for quite some time, he admits that he has not been taking care of himself and not seeing any regular primary care doctors. He states that he is compliant with his blood pressure medications but did not take them today. 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 His kids have been sick with URIs recently, but no sick contacts with GI illnesses. Nonsmoker with no FHx of DVT/PE/cardiac disease.   He denies any fevers, chills, rhinorrhea, sore throat, ear pain or drainage, hemoptysis, wheezing, diaphoresis, lightheadedness, shortness of breath at rest, ongoing chest pain, unilateral leg swelling, claudication, orthopnea, recent  surgery/immobilization, history of DVT/PE, nausea, vomiting, diarrhea, constipation, melena, hematochezia, obstipation, dysuria, hematuria, numbness, tingling, weakness, headache, or vision changes.  Patient is a 30y.o. male presenting with abdominal pain and hand pain. The history is provided by the patient and medical records. No language interpreter was used.  Abdominal Pain Pain location:  LUQ Pain quality comment:  Soreness Pain radiates to:  Does not radiate Pain severity:  Mild Onset quality:  Gradual Duration:  5 days Timing:  Intermittent Progression:  Unchanged Chronicity:  New Context: recent travel (truck driver) and sick contacts (URI symptoms in kids, no GI illness sick contacts)   Context: not alcohol use and not suspicious food intake   Relieved by:  None tried Worsened by:  Position changes Ineffective treatments:  None tried Associated symptoms: cough and shortness of breath (on exertion)   Associated symptoms: no chest pain (none at rest or currently), no chills, no constipation, no diarrhea, no dysuria, no fever, no flatus, no hematemesis, no hematochezia, no hematuria, no melena, no nausea, no sore throat and no vomiting   Risk factors: obesity   Risk factors: no alcohol abuse and no NSAID use   Hand Pain Associated symptoms include abdominal pain, coughing and myalgias (b/l hand pain). Pertinent negatives include no arthralgias, chest pain (none at rest or currently), chills, diaphoresis, fever, headaches, nausea, numbness, sore throat, vomiting or weakness.    Past Medical History  Diagnosis Date  . Hypertension    History reviewed. No pertinent past surgical history. No family history on file. Social History  Substance Use Topics  . Smoking status: Never Smoker   .  Smokeless tobacco: Never Used  . Alcohol Use: No    Review of Systems  Constitutional: Negative for fever, chills and diaphoresis.  HENT: Negative for ear discharge, ear pain, rhinorrhea  and sore throat.   Eyes: Negative for visual disturbance.  Respiratory: Positive for cough, chest tightness (only with coughing) and shortness of breath (on exertion). Negative for wheezing.   Cardiovascular: Positive for leg swelling. Negative for chest pain (none at rest or currently).  Gastrointestinal: Positive for abdominal pain. Negative for nausea, vomiting, diarrhea, constipation, blood in stool, melena, hematochezia, flatus and hematemesis.  Genitourinary: Negative for dysuria and hematuria.  Musculoskeletal: Positive for myalgias (b/l hand pain). Negative for arthralgias.  Skin: Negative for color change.  Allergic/Immunologic: Negative for immunocompromised state.  Neurological: Negative for weakness, light-headedness, numbness and headaches.  Psychiatric/Behavioral: Negative for confusion.   10 Systems reviewed and are negative for acute change except as noted in the HPI.    Allergies  Review of patient's allergies indicates no known allergies.  Home Medications   Prior to Admission medications   Medication Sig Start Date End Date Taking? Authorizing Provider  amLODipine (NORVASC) 10 MG tablet Take 10 mg by mouth daily.    Historical Provider, MD  amLODipine (NORVASC) 10 MG tablet Take 1 tablet (10 mg total) by mouth daily. 09/04/15   Olivia Canter Sam, PA-C  labetalol (NORMODYNE) 100 MG tablet Take 100 mg by mouth 2 (two) times daily.    Historical Provider, MD  labetalol (NORMODYNE) 100 MG tablet Take 1 tablet (100 mg total) by mouth 2 (two) times daily. 09/04/15   Olivia Canter Sam, PA-C  lisinopril (PRINIVIL,ZESTRIL) 20 MG tablet Take 20 mg by mouth daily.    Historical Provider, MD  lisinopril (PRINIVIL,ZESTRIL) 20 MG tablet Take 1 tablet (20 mg total) by mouth daily. 09/04/15   Olivia Canter Sam, PA-C  losartan-hydrochlorothiazide (HYZAAR) 100-12.5 MG per tablet Take 1 tablet by mouth daily.    Historical Provider, MD  losartan-hydrochlorothiazide (HYZAAR) 100-12.5 MG tablet Take 1  tablet by mouth daily. 09/04/15   Olivia Canter Sam, PA-C  traMADol (ULTRAM) 50 MG tablet Take 1 tablet (50 mg total) by mouth every 6 (six) hours as needed. 06/22/15   Noemi Chapel, MD   Triage VS: BP 231/176 mmHg  Pulse 116  Temp(Src) 98 F (36.7 C) (Oral)  Resp 18  Ht '6\' 2"'  (1.88 m)  Wt 193.828 kg  BMI 54.84 kg/m2  SpO2 95% Recheck BP: 178/136 mmHg Physical Exam  Constitutional: He is oriented to person, place, and time. He appears well-developed and well-nourished.  Non-toxic appearance. No distress.  Afebrile, nontoxic, NAD, morbidly obese, tachycardic in the 100-110s, with HTN noted somewhat similar to prior ED visits  HENT:  Head: Normocephalic and atraumatic.  Nose: Nose normal.  Mouth/Throat: Uvula is midline, oropharynx is clear and moist and mucous membranes are normal. No trismus in the jaw. No uvula swelling.  Nose clear. Oropharynx clear and moist, without uvular swelling or deviation, no trismus or drooling, no tonsillar swelling or erythema, no exudates.    Eyes: Conjunctivae and EOM are normal. Right eye exhibits no discharge. Left eye exhibits no discharge.  Neck: Normal range of motion. Neck supple.  Cardiovascular: Regular rhythm, normal heart sounds and intact distal pulses.  Tachycardia present.  Exam reveals no gallop and no friction rub.   No murmur heard. Tachycardic in the 100-110s, reg rhythm, nl s1/s2, no m/r/g, distal pulses intact, with 3-4+ b/l pitting edema  Pulmonary/Chest: Effort normal and  breath sounds normal. No respiratory distress. He has no decreased breath sounds. He has no wheezes. He has no rhonchi. He has no rales.  Body habitus somewhat limits exam but overall CTAB in all lung fields, no w/r/r, no hypoxia or increased WOB, speaking in full sentences, SpO2 95% on RA   Abdominal: Soft. Normal appearance and bowel sounds are normal. He exhibits no distension and no pulsatile midline mass. There is tenderness in the left upper quadrant. There is no  rigidity, no rebound, no guarding, no CVA tenderness, no tenderness at McBurney's point and negative Murphy's sign.    Soft, morbidly obese which somewhat limits exam but without obvious distension, +BS throughout, with very minimal LUQ TTP, no r/g/r, neg murphy's, neg mcburney's, no CVA TTP, no visible midline pulsatile mass   Musculoskeletal: Normal range of motion.  MAE x4 Strength and sensation grossly intact Distal pulses intact Gait steady 3-4+ BLE pedal edema, neg homan's bilaterally   Neurological: He is alert and oriented to person, place, and time. He has normal strength. No sensory deficit.  Skin: Skin is warm, dry and intact. No rash noted.  Psychiatric: He has a normal mood and affect.  Nursing note and vitals reviewed.   ED Course  Procedures (including critical care time)  CRITICAL CARE-hypertensive emergency Performed by: Corine Shelter   Total critical care time: 65 minutes  Critical care time was exclusive of separately billable procedures and treating other patients.  Critical care was necessary to treat or prevent imminent or life-threatening deterioration.  Critical care was time spent personally by me on the following activities: development of treatment plan with patient and/or surrogate as well as nursing, discussions with consultants, evaluation of patient's response to treatment, examination of patient, obtaining history from patient or surrogate, ordering and performing treatments and interventions, ordering and review of laboratory studies, ordering and review of radiographic studies, pulse oximetry and re-evaluation of patient's condition.   Labs Review Labs Reviewed  COMPREHENSIVE METABOLIC PANEL - Abnormal; Notable for the following:    Glucose, Bld 122 (*)    Creatinine, Ser 1.26 (*)    Albumin 3.2 (*)    Total Bilirubin 1.8 (*)    All other components within normal limits  CBC WITH DIFFERENTIAL/PLATELET - Abnormal; Notable for  the following:    RBC 5.89 (*)    MCV 66.7 (*)    MCH 22.6 (*)    RDW 15.6 (*)    All other components within normal limits  D-DIMER, QUANTITATIVE (NOT AT [facility]) - Abnormal; Notable for the following:    D-Dimer, Quant 2.03 (*)    All other components within normal limits  LIPASE, BLOOD  URINALYSIS, ROUTINE W REFLEX MICROSCOPIC (NOT AT [facility])  BRAIN NATRIURETIC PEPTIDE  I-STAT TROPOININ, ED  I-STAT CG4 LACTIC ACID, ED    Imaging Review Dg Chest 2 View  11/01/2015  CLINICAL DATA:  30 year old male with history of central non radiating chest pain and shortness breath for the past 3 days. Cough. EXAM: CHEST  2 VIEW COMPARISON:  Chest x-ray 05/23/2004. FINDINGS: Diffuse peribronchial cuffing. Patchy airspace disease predominantly throughout the right mid to lower lung. Left lung appears relatively clear. No pneumothorax. No suspicious appearing pulmonary nodules or masses. No evidence of pulmonary edema. Heart size is moderately enlarged (new compared to the prior study). Mild elevation of the right hemidiaphragm. Upper mediastinal contours are within normal limits. IMPRESSION: 1. Peribronchial cuffing with patchy airspace disease throughout the right mid to lower lung, concerning for  bronchitis with multilobar bronchopneumonia, most evident in the right middle and lower lobes. 2. In addition, however, there is also now moderate cardiomegaly which is new compared to the prior study. In this age group, clinical correlation for nonischemic cardiomyopathy is recommended, such as viral myocarditis. Electronically Signed   By: Vinnie Langton M.D.   On: 11/01/2015 11:32   Ct Angio Chest Pe W/cm &/or Wo Cm  11/01/2015  CLINICAL DATA:  Shortness of breath, abdominal pain. Evaluate for pulmonary embolus or dissection. EXAM: CT ANGIOGRAPHY CHEST, ABDOMEN AND PELVIS TECHNIQUE: Multidetector CT imaging through the chest, abdomen and pelvis was performed using the standard protocol during bolus administration of  intravenous contrast. Multiplanar reconstructed images and MIPs were obtained and reviewed to evaluate the vascular anatomy. CONTRAST:  166m OMNIPAQUE IOHEXOL 350 MG/ML SOLN COMPARISON:  None. FINDINGS: CTA CHEST FINDINGS Mediastinum/Nodes: There is cardiomegaly. Small pericardial effusion. Aorta is normal caliber. No dissection. No filling defects in the pulmonary arteries to suggest pulmonary emboli. No mediastinal, hilar, or axillary adenopathy. Lungs/Pleura: Mild hazy opacities within the lungs could reflect early edema. Tree-in-bud densities are noted in the inferior right upper lobe and within the right lower lobe concerning for early bronchopneumonia. No confluent opacity on the left. No effusions. Musculoskeletal: Chest wall soft tissues are unremarkable. No acute bony abnormality. Review of the MIP images confirms the above findings. CTA ABDOMEN AND PELVIS FINDINGS Hepatobiliary: Unremarkable appearance.  Gallbladder unremarkable. Pancreas: Unremarkable appearance Spleen: Unremarkable appearance Adrenals/Urinary Tract: No adrenal abnormality. No focal renal abnormality. No stones or hydronephrosis. Urinary bladder is unremarkable. Stomach/Bowel: Normal appendix. Stomach, large and small bowel grossly unremarkable. Vascular/Lymphatic: No evidence of aneurysm or adenopathy. No evidence of dissection. Mesenteric vessels and single renal arteries are widely patent bilaterally. Reproductive: No abnormality. Other: No free fluid or free air. Musculoskeletal: No acute bony abnormality. Review of the MIP images confirms the above findings. IMPRESSION: No evidence of pulmonary embolus. No evidence of aortic aneurysm or dissection. Cardiomegaly, small pericardial effusion. Mild ground-glass opacities within the lungs could reflect early edema. Or focal tree-in-bud densities within the inferior right upper lobe and adjacent right lower lobe. This could reflect early bronchopneumonia. No acute findings in the abdomen  or pelvis. Electronically Signed   By: KRolm BaptiseM.D.   On: 11/01/2015 12:58   Ct Abdomen Pelvis W Contrast  11/01/2015  CLINICAL DATA:  Shortness of breath, abdominal pain. Evaluate for pulmonary embolus or dissection. EXAM: CT ANGIOGRAPHY CHEST, ABDOMEN AND PELVIS TECHNIQUE: Multidetector CT imaging through the chest, abdomen and pelvis was performed using the standard protocol during bolus administration of intravenous contrast. Multiplanar reconstructed images and MIPs were obtained and reviewed to evaluate the vascular anatomy. CONTRAST:  1020mOMNIPAQUE IOHEXOL 350 MG/ML SOLN COMPARISON:  None. FINDINGS: CTA CHEST FINDINGS Mediastinum/Nodes: There is cardiomegaly. Small pericardial effusion. Aorta is normal caliber. No dissection. No filling defects in the pulmonary arteries to suggest pulmonary emboli. No mediastinal, hilar, or axillary adenopathy. Lungs/Pleura: Mild hazy opacities within the lungs could reflect early edema. Tree-in-bud densities are noted in the inferior right upper lobe and within the right lower lobe concerning for early bronchopneumonia. No confluent opacity on the left. No effusions. Musculoskeletal: Chest wall soft tissues are unremarkable. No acute bony abnormality. Review of the MIP images confirms the above findings. CTA ABDOMEN AND PELVIS FINDINGS Hepatobiliary: Unremarkable appearance.  Gallbladder unremarkable. Pancreas: Unremarkable appearance Spleen: Unremarkable appearance Adrenals/Urinary Tract: No adrenal abnormality. No focal renal abnormality. No stones or hydronephrosis. Urinary bladder is unremarkable. Stomach/Bowel:  Normal appendix. Stomach, large and small bowel grossly unremarkable. Vascular/Lymphatic: No evidence of aneurysm or adenopathy. No evidence of dissection. Mesenteric vessels and single renal arteries are widely patent bilaterally. Reproductive: No abnormality. Other: No free fluid or free air. Musculoskeletal: No acute bony abnormality. Review of the  MIP images confirms the above findings. IMPRESSION: No evidence of pulmonary embolus. No evidence of aortic aneurysm or dissection. Cardiomegaly, small pericardial effusion. Mild ground-glass opacities within the lungs could reflect early edema. Or focal tree-in-bud densities within the inferior right upper lobe and adjacent right lower lobe. This could reflect early bronchopneumonia. No acute findings in the abdomen or pelvis. Electronically Signed   By: Rolm Baptise M.D.   On: 11/01/2015 12:58   I have personally reviewed and evaluated these images and lab results as part of my medical decision-making.   EKG Interpretation   Date/Time:  Sunday November 01 2015 10:22:01 EDT Ventricular Rate:  112 PR Interval:  148 QRS Duration: 82 QT Interval:  340 QTC Calculation: 464 R Axis:   -73 Text Interpretation:  Sinus tachycardia Possible Left atrial enlargement  Left axis deviation Abnormal ECG Confirmed by ALLEN  MD, ANTHONY ([id number])  on 11/01/2015 11:31:08 AM      MDM   Final diagnoses:  LUQ abdominal pain  CAP (community acquired pneumonia)  Bronchitis  DOE (dyspnea on exertion)  Chest tightness  Peripheral edema  Malignant hypertension  Elevated d-dimer  Abnormal EKG  Cardiomegaly - hypertensive  Elevated bilirubin  AKI (acute kidney injury) ([place])  Pericardial effusion  Pulmonary edema cardiac cause ([place])    30 y.o. 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 Didn't take his BP meds today but states he took them yesterday. On exam, morbidly obese, tachycardic in the 100-110s, SpO2 95% on RA, with BLE 3-4+ pitting edema, clear lung sounds, with minimal tenderness to LUQ but nonperitoneal, BP 231/176 which is similar to prior  readings but in the setting of his current complaints, it's concerning for hypertensive emergency. Given tachycardia and LE swelling, and complaints of DOE, will obtain dimer-- if +, will need to scan chest. Doubt dissection as an etiology of his abd pain, will hold off on abd imaging for now. EKG showing sinus tachycardia, LAE, LAD, without acute ischemic findings. Will get labs, trop, CXR, lactic, dimer, BNP, U/A, and give IV labetalol and PO home BP meds to aggressively treat his HTN which I think is the main etiology of his symptoms. Will get manual BP check now.  Discussed case with my attending Dr. Zenia Resides who agrees with plan. Will give pain meds but hold off on fluids since he's already fluid-overloaded. Will hold off on lasix until I know his kidney function. No current or recent CP, doubt need for NTG/ASA. Will reassess shortly.   11:15 AM Pharmacy called regarding pt being on ACEI and ARB, recommended using only one or the other, which I would agree with, I feel it's odd that he's on these both outpatient but will discontinue the ARB today and keep the remainder of the meds that we ordered initially. Of note, recheck BP manually was 178/136 (MAP 150) so we would want to decrease his MAP by ~30-35 (to MAP 120-115) in the next ~1hr, then gradually  reduce it from there. Of note, his CBC is unremarkable although the differential is pending. Lactic WNL. Remaining labs pending.   11:37 AM Trop neg. Dimer elevated at 2.03, will proceed with scanning chest and also abd/pelvis to fully r/o dissection as well. Differential still pending, lipase and CMP pending (will need kidney function before getting CTs). CXR returning showing peribronchial cuffing with patchy airspace disease in RLL concerning for bronchitis vs bronchopneumonia, will start on abx for CAP. Also shows cardiomegaly, which goes along with the concerns for HTN emergency/urgency. Will await further labs. Will recheck BP now that he's received  labetalol, HR improving into the 90s.   11:53 AM BP down to 155/113, MAP 127, which is reassuring. Will let him get the rest of the PO home BP meds and this should bring his BP down into healthier range over the next several hours. Lipase WNL, CBC differential WNL. CMP with minimally elevated Cr at 1.26, I feel he could get the scans without issue, but don't want to push it by giving lasix as well, especially until we have the BNP. Bili minimally elevated at 1.8 without changes in liver function or alk phos. BNP and U/A not yet obtained.  1:17 PM CT scans showing no PE or dissection, cardiomegaly with small pericardial effusion, mild ground-glass opacities which likely reflects early edema, some densities in the RUL/RLL which could reflect early bronchopneumonia; overall these findings seem consistent with HTN end-organ damage; his BP is creeping back up in the 160/120s range upon reassessment, despite being given PO BP meds; HR still in the 90s. I feel he needs to be admitted for hypertensive emergency/urgency. BNP pending. U/A not yet obtained. Will await consult call for admission.  1:44 PM Tye Savoy of [facility] returning page, will admit. Will be around to see him shortly, does not want holding orders placed just yet. Pt stable at this time. Please see her notes for further documentation of care.  BP 161/131 mmHg  Pulse 94  Temp(Src) 98 F (36.7 C) (Oral)  Resp 18  Ht '6\' 2"'  (1.88 m)  Wt 193.828 kg  BMI 54.84 kg/m2  SpO2 95%  Meds ordered this encounter  Medications  . morphine 4 MG/ML injection 4 mg    Sig:   . lisinopril (PRINIVIL,ZESTRIL) tablet 20 mg    Sig:   . labetalol (NORMODYNE,TRANDATE) injection 20 mg    Sig:   . amLODipine (NORVASC) tablet 10 mg    Sig:   . hydrochlorothiazide (HYDRODIURIL) tablet 12.5 mg    Sig:   . cefTRIAXone (ROCEPHIN) 1 g in dextrose 5 % 50 mL IVPB    Sig:     Order Specific Question:  Antibiotic Indication:    Answer:  CAP  . azithromycin  (ZITHROMAX) 500 mg in dextrose 5 % 250 mL IVPB    Sig:     Order Specific Question:  Antibiotic Indication:    Answer:  CAP  . iohexol (OMNIPAQUE) 350 MG/ML injection 100 mL    Sig:     Korrey Schleicher Camprubi-Soms, PA-C 11/01/15 1441  Lacretia Leigh, MD 11/01/15 [phone]

## 2015-11-01 NOTE — ED Notes (Signed)
Pt c/o left abdominal pain and B/L hand pain for couple days. Pt denies N/V

## 2015-11-02 ENCOUNTER — Observation Stay (HOSPITAL_COMMUNITY): Payer: Self-pay

## 2015-11-02 DIAGNOSIS — R05 Cough: Secondary | ICD-10-CM

## 2015-11-02 LAB — BLOOD GAS, ARTERIAL
Acid-Base Excess: 1.2 mmol/L (ref 0.0–2.0)
Bicarbonate: 25.4 mEq/L — ABNORMAL HIGH (ref 20.0–24.0)
DRAWN BY: 313061
FIO2: 0.21
O2 SAT: 92.6 %
PATIENT TEMPERATURE: 98.6
TCO2: 26.7 mmol/L (ref 0–100)
pCO2 arterial: 41.2 mmHg (ref 35.0–45.0)
pH, Arterial: 7.407 (ref 7.350–7.450)
pO2, Arterial: 71 mmHg — ABNORMAL LOW (ref 80.0–100.0)

## 2015-11-02 LAB — CBC
HEMATOCRIT: 36.6 % — AB (ref 39.0–52.0)
HEMOGLOBIN: 12 g/dL — AB (ref 13.0–17.0)
MCH: 22.1 pg — ABNORMAL LOW (ref 26.0–34.0)
MCHC: 32.8 g/dL (ref 30.0–36.0)
MCV: 67.3 fL — ABNORMAL LOW (ref 78.0–100.0)
Platelets: 264 10*3/uL (ref 150–400)
RBC: 5.44 MIL/uL (ref 4.22–5.81)
RDW: 15.7 % — ABNORMAL HIGH (ref 11.5–15.5)
WBC: 7 10*3/uL (ref 4.0–10.5)

## 2015-11-02 LAB — BASIC METABOLIC PANEL
Anion gap: 13 (ref 5–15)
BUN: 10 mg/dL (ref 6–20)
CALCIUM: 8.9 mg/dL (ref 8.9–10.3)
CHLORIDE: 104 mmol/L (ref 101–111)
CO2: 24 mmol/L (ref 22–32)
Creatinine, Ser: 1.08 mg/dL (ref 0.61–1.24)
GFR calc Af Amer: >60 mL/min (ref >60)
GLUCOSE: 108 mg/dL — AB (ref 65–99)
Potassium: 4.2 mmol/L (ref 3.5–5.1)
SODIUM: 141 mmol/L (ref 135–145)

## 2015-11-02 LAB — GLUCOSE, CAPILLARY: GLUCOSE-CAPILLARY: 113 mg/dL — AB (ref 65–99)

## 2015-11-02 LAB — HIV ANTIBODY (ROUTINE TESTING W REFLEX): HIV Screen 4th Generation wRfx: NONREACTIVE

## 2015-11-02 LAB — TROPONIN I: TROPONIN I: 0.07 ng/mL — AB (ref <0.031)

## 2015-11-02 MED ORDER — HYDRALAZINE HCL 50 MG PO TABS: 50.0000 mg | ORAL_TABLET | Freq: Three times a day (TID) | ORAL | Status: DC

## 2015-11-02 MED ORDER — HYDROCODONE-ACETAMINOPHEN 5-325 MG PO TABS: 1.0000 | ORAL_TABLET | ORAL | Status: DC | PRN

## 2015-11-02 MED ORDER — HYDRALAZINE HCL 50 MG PO TABS
100.0000 mg | ORAL_TABLET | Freq: Three times a day (TID) | ORAL | Status: DC
  Administered 2015-11-02 – 2015-11-07 (×12): 100 mg via ORAL
  Filled 2015-11-02 (×13): qty 2

## 2015-11-02 MED ORDER — ISOSORBIDE MONONITRATE ER 60 MG PO TB24
60.0000 mg | ORAL_TABLET | Freq: Every day | ORAL | Status: DC
  Administered 2015-11-02 – 2015-11-07 (×6): 60 mg via ORAL
  Filled 2015-11-02 (×7): qty 1

## 2015-11-02 MED ORDER — FUROSEMIDE 10 MG/ML IJ SOLN
40.0000 mg | Freq: Three times a day (TID) | INTRAMUSCULAR | Status: DC
  Administered 2015-11-02 (×2): 40 mg via INTRAVENOUS
  Filled 2015-11-02 (×2): qty 4

## 2015-11-02 NOTE — Progress Notes (Signed)
Bp 160/109. Apresoline 10 mg given . Pt also having episodes of apnea lasting five seconds or greater. Sats dropped to 85% on 2l O2. Paul Lamb notified. CPAP ordered. Wife states he is frequently apneic at home although he has never been evaluated for it.

## 2015-11-02 NOTE — Clinical Social Work Note (Signed)
CSW received referral for medication assistance, case manager will assist, CSW to sign off.  Ervin Knack. Inara Dike, MSW, Theresia Majors [phone] 11/02/2015 8:56 AM

## 2015-11-02 NOTE — Progress Notes (Signed)
Patient Demographics:    Paul Lamb, is a 30 y.o. male, DOB - 1986-01-18, UJW:[id number]  Admit date - 11/01/2015   Admitting Physician Houston Siren, MD  Outpatient Primary MD for the patient is No PCP Per Patient  LOS -    Chief Complaint  Patient presents with  . Abdominal Pain  . Hand Pain        Subjective:    [name] today has, No headache, No chest pain, No abdominal pain - No Nausea, No new weakness tingling or numbness, No Cough - Improved shortness of breath.   Assessment  & Plan :     1.Hypertensive emergency with chest tightness and pulmonary edema. Blood pressure much improved, blood pressure regimen titrated, placed on IV Lasix, symptoms resolved, get echogram and monitor.  2. Mildly elevated troponin. In none serious pattern due to #1 above, check echogram to evaluate wall motion and EF. Currently on beta blocker and aspirin added. Chest pain-free.  3. Morbid obesity with possible undiagnosed obstructive sleep apnea. Had episodes of bradycardia and apnea at night, placed on CPAP, requested case management to see if patient could qualify for CPAP with outpatient sleep study. Follow with PCP for weight reduction.  4. Elevated d-dimer upon admission. CT angiogram chest unremarkable, lower estimate a venous duplex ordered.  5. Acute on chronic CHF. Likely diastolic. Lasix, beta blocker, Imdur, check echogram.  6. Intermittent left upper quadrant abdominal pain. Resolved. Place on PPI.  7. Mild L Hand swelling after trauma - check X ray.     Code Status : Full  Family Communication  : None  Disposition Plan  : Likely discharge home in a few days  Consults  :  None  Procedures  :   CTA Lungs - No PE, CHF versus early pneumonia.  CT abdomen pelvis. No acute  findings.  Echogram  Venous duplex  DVT Prophylaxis  :  Lovenox    Lab Results  Component Value Date   PLT 264 11/02/2015    Inpatient Medications  Scheduled Meds: . amLODipine  10 mg Oral Daily  . enoxaparin (LOVENOX) injection  90 mg Subcutaneous Q24H  . famotidine  20 mg Oral QHS  . furosemide  40 mg Intravenous TID  . hydrALAZINE  100 mg Oral 3 times per day  . labetalol  100 mg Oral BID  . levofloxacin  750 mg Oral Q24H  . lisinopril  20 mg Oral Daily   Continuous Infusions:  PRN Meds:.acetaminophen **OR** [DISCONTINUED] acetaminophen, guaiFENesin, hydrALAZINE, HYDROcodone-acetaminophen, [DISCONTINUED] ondansetron **OR** ondansetron (ZOFRAN) IV  Antibiotics  :    Anti-infectives    Start     Dose/Rate Route Frequency Ordered Stop   11/02/15 1000  levofloxacin (LEVAQUIN) tablet 750 mg     750 mg Oral Every 24 hours 11/01/15 1601 11/07/15 0959   11/02/15 0000  levofloxacin (LEVAQUIN) tablet 750 mg  Status:  Discontinued     750 mg Oral Every 24 hours 11/01/15 1554 11/01/15 1601   11/01/15 1145  cefTRIAXone (ROCEPHIN) 1 g in dextrose 5 % 50 mL IVPB     1 g 100 mL/hr over 30 Minutes Intravenous  Once 11/01/15 1137 11/01/15 1335   11/01/15 1145  azithromycin (ZITHROMAX) 500 mg in dextrose 5 %  250 mL IVPB     500 mg 250 mL/hr over 60 Minutes Intravenous  Once 11/01/15 1137 11/01/15 1549        Objective:   Filed Vitals:   11/02/15 0200 11/02/15 0500 11/02/15 0608 11/02/15 0859  BP:   135/91 164/112  Pulse:   87   Temp:   97.3 F (36.3 C)   TempSrc:   Oral   Resp:   18   Height:      Weight:      SpO2: 97% 96% 98%     Wt Readings from Last 3 Encounters:  11/02/15 193.8 kg (427 lb 4 oz)  06/22/15 162.388 kg (358 lb)  09/29/11 157.942 kg (348 lb 3.2 oz)     Intake/Output Summary (Last 24 hours) at 11/02/15 1208 Last data filed at 11/01/15 2000  Gross per 24 hour  Intake    450 ml  Output    200 ml  Net    250 ml     Physical Exam  Awake Alert,  Oriented X 3, No new F.N deficits, Normal affect [place].AT,PERRAL Supple Neck,No JVD, No cervical lymphadenopathy appriciated.  Symmetrical Chest wall movement, Good air movement bilaterally, +VE RALES RRR,No Gallops,Rubs or new Murmurs, No Parasternal Heave +ve B.Sounds, Abd Soft, No tenderness, No organomegaly appriciated, No rebound - guarding or rigidity. No Cyanosis, Clubbing , 2+ edema, No new Rash or bruise       Data Review:   Micro Results Recent Results (from the past 240 hour(s))  Culture, blood (routine x 2) Call MD if unable to obtain prior to antibiotics being given     Status: None (Preliminary result)   Collection Time: 11/01/15  6:12 PM  Result Value Ref Range Status   Specimen Description BLOOD RIGHT ANTECUBITAL  Final   Special Requests BOTTLES DRAWN AEROBIC AND ANAEROBIC 5CC  Final   Culture NO GROWTH < 24 HOURS  Final   Report Status PENDING  Incomplete  Culture, blood (routine x 2) Call MD if unable to obtain prior to antibiotics being given     Status: None (Preliminary result)   Collection Time: 11/01/15  6:24 PM  Result Value Ref Range Status   Specimen Description BLOOD RIGHT ANTECUBITAL  Final   Special Requests BOTTLES DRAWN AEROBIC AND ANAEROBIC 5CC  Final   Culture NO GROWTH < 24 HOURS  Final   Report Status PENDING  Incomplete    Radiology Reports Dg Chest 2 View  11/01/2015  CLINICAL DATA:  30 year old male with history of central non radiating chest pain and shortness breath for the past 3 days. Cough. EXAM: CHEST  2 VIEW COMPARISON:  Chest x-ray 05/23/2004. FINDINGS: Diffuse peribronchial cuffing. Patchy airspace disease predominantly throughout the right mid to lower lung. Left lung appears relatively clear. No pneumothorax. No suspicious appearing pulmonary nodules or masses. No evidence of pulmonary edema. Heart size is moderately enlarged (new compared to the prior study). Mild elevation of the right hemidiaphragm. Upper mediastinal contours are within  normal limits. IMPRESSION: 1. Peribronchial cuffing with patchy airspace disease throughout the right mid to lower lung, concerning for bronchitis with multilobar bronchopneumonia, most evident in the right middle and lower lobes. 2. In addition, however, there is also now moderate cardiomegaly which is new compared to the prior study. In this age group, clinical correlation for nonischemic cardiomyopathy is recommended, such as viral myocarditis. Electronically Signed   By: Trudie Reed M.D.   On: 11/01/2015 11:32   Ct Angio Chest Pe  W/cm &/or Wo Cm  11/01/2015  CLINICAL DATA:  Shortness of breath, abdominal pain. Evaluate for pulmonary embolus or dissection. EXAM: CT ANGIOGRAPHY CHEST, ABDOMEN AND PELVIS TECHNIQUE: Multidetector CT imaging through the chest, abdomen and pelvis was performed using the standard protocol during bolus administration of intravenous contrast. Multiplanar reconstructed images and MIPs were obtained and reviewed to evaluate the vascular anatomy. CONTRAST:  OMNIPAQUE IOHEXOL 350 MG/ML SOLN COMPARISON:  None. FINDINGS: CTA CHEST FINDINGS Mediastinum/Nodes: There is cardiomegaly. Small pericardial effusion. Aorta is normal caliber. No dissection. No filling defects in the pulmonary arteries to suggest pulmonary emboli. No mediastinal, hilar, or axillary adenopathy. Lungs/Pleura: Mild hazy opacities within the lungs could reflect early edema. Tree-in-bud densities are noted in the inferior right upper lobe and within the right lower lobe concerning for early bronchopneumonia. No confluent opacity on the left. No effusions. Musculoskeletal: Chest wall soft tissues are unremarkable. No acute bony abnormality. Review of the MIP images confirms the above findings. CTA ABDOMEN AND PELVIS FINDINGS Hepatobiliary: Unremarkable appearance.  Gallbladder unremarkable. Pancreas: Unremarkable appearance Spleen: Unremarkable appearance Adrenals/Urinary Tract: No adrenal abnormality. No focal  renal abnormality. No stones or hydronephrosis. Urinary bladder is unremarkable. Stomach/Bowel: Normal appendix. Stomach, large and small bowel grossly unremarkable. Vascular/Lymphatic: No evidence of aneurysm or adenopathy. No evidence of dissection. Mesenteric vessels and single renal arteries are widely patent bilaterally. Reproductive: No abnormality. Other: No free fluid or free air. Musculoskeletal: No acute bony abnormality. Review of the MIP images confirms the above findings. IMPRESSION: No evidence of pulmonary embolus. No evidence of aortic aneurysm or dissection. Cardiomegaly, small pericardial effusion. Mild ground-glass opacities within the lungs could reflect early edema. Or focal tree-in-bud densities within the inferior right upper lobe and adjacent right lower lobe. This could reflect early bronchopneumonia. No acute findings in the abdomen or pelvis. Electronically Signed   By: Charlett Nose M.D.   On: 11/01/2015 12:58   Ct Abdomen Pelvis W Contrast  11/01/2015  CLINICAL DATA:  Shortness of breath, abdominal pain. Evaluate for pulmonary embolus or dissection. EXAM: CT ANGIOGRAPHY CHEST, ABDOMEN AND PELVIS TECHNIQUE: Multidetector CT imaging through the chest, abdomen and pelvis was performed using the standard protocol during bolus administration of intravenous contrast. Multiplanar reconstructed images and MIPs were obtained and reviewed to evaluate the vascular anatomy. CONTRAST:  OMNIPAQUE IOHEXOL 350 MG/ML SOLN COMPARISON:  None. FINDINGS: CTA CHEST FINDINGS Mediastinum/Nodes: There is cardiomegaly. Small pericardial effusion. Aorta is normal caliber. No dissection. No filling defects in the pulmonary arteries to suggest pulmonary emboli. No mediastinal, hilar, or axillary adenopathy. Lungs/Pleura: Mild hazy opacities within the lungs could reflect early edema. Tree-in-bud densities are noted in the inferior right upper lobe and within the right lower lobe concerning for early  bronchopneumonia. No confluent opacity on the left. No effusions. Musculoskeletal: Chest wall soft tissues are unremarkable. No acute bony abnormality. Review of the MIP images confirms the above findings. CTA ABDOMEN AND PELVIS FINDINGS Hepatobiliary: Unremarkable appearance.  Gallbladder unremarkable. Pancreas: Unremarkable appearance Spleen: Unremarkable appearance Adrenals/Urinary Tract: No adrenal abnormality. No focal renal abnormality. No stones or hydronephrosis. Urinary bladder is unremarkable. Stomach/Bowel: Normal appendix. Stomach, large and small bowel grossly unremarkable. Vascular/Lymphatic: No evidence of aneurysm or adenopathy. No evidence of dissection. Mesenteric vessels and single renal arteries are widely patent bilaterally. Reproductive: No abnormality. Other: No free fluid or free air. Musculoskeletal: No acute bony abnormality. Review of the MIP images confirms the above findings. IMPRESSION: No evidence of pulmonary embolus. No evidence of aortic aneurysm or  dissection. Cardiomegaly, small pericardial effusion. Mild ground-glass opacities within the lungs could reflect early edema. Or focal tree-in-bud densities within the inferior right upper lobe and adjacent right lower lobe. This could reflect early bronchopneumonia. No acute findings in the abdomen or pelvis. Electronically Signed   By: Charlett Nose M.D.   On: 11/01/2015 12:58     CBC  Recent Labs Lab 11/01/15 1043 11/02/15 0339  WBC 8.8 7.0  HGB 13.3 12.0*  HCT 39.3 36.6*  PLT 304 264  MCV 66.7* 67.3*  MCH 22.6* 22.1*  MCHC 33.8 32.8  RDW 15.6* 15.7*  LYMPHSABS 1.9  --   MONOABS 0.6  --   EOSABS 0.1  --   BASOSABS 0.1  --     Chemistries   Recent Labs Lab 11/01/15 1043 11/02/15 0339  NA 140 141  K 4.9 4.2  CL 102 104  CO2 26 24  GLUCOSE 122* 108*  BUN 13 10  CREATININE 1.26* 1.08  CALCIUM 9.2 8.9  AST 23  --   ALT 20  --   ALKPHOS 69  --   BILITOT 1.8*  --     ------------------------------------------------------------------------------------------------------------------ No results for input(s): CHOL, HDL, LDLCALC, TRIG, CHOLHDL, LDLDIRECT in the last 72 hours.  No results found for: HGBA1C ------------------------------------------------------------------------------------------------------------------ No results for input(s): TSH, T4TOTAL, T3FREE, THYROIDAB in the last 72 hours.  Invalid input(s): FREET3 ------------------------------------------------------------------------------------------------------------------ No results for input(s): VITAMINB12, FOLATE, FERRITIN, TIBC, IRON, RETICCTPCT in the last 72 hours.  Coagulation profile No results for input(s): INR, PROTIME in the last 168 hours.   Recent Labs  11/01/15 1104  DDIMER 2.03*    Cardiac Enzymes  Recent Labs Lab 11/01/15 1547 11/01/15 2047 11/02/15 0339  TROPONINI 0.09* 0.07* 0.07*   ------------------------------------------------------------------------------------------------------------------    Component Value Date/Time   BNP 300.7* 11/01/2015 1245    Time Spent in minutes  35   Beadie Matsunaga K M.D on 11/02/2015 at 12:08 PM  Between 7am to 7pm - Pager - [phone]  After 7pm go to [email or web address] - password [facility]  [facility] -  Office  [phone]

## 2015-11-02 NOTE — Progress Notes (Signed)
Orthopedic Tech Progress Note Patient Details:  Brewer Leshko [facility] 1985/11/01 [id number]  Ortho Devices Type of Ortho Device: Roland Rack boot Ortho Device/Splint Location: (B) LE Ortho Device/Splint Interventions: Ordered, Application   Jennye Moccasin 11/02/2015, 4:02 PM

## 2015-11-02 NOTE — Plan of Care (Signed)
Problem: Food- and Nutrition-Related Knowledge Deficit (NB-1.1) Goal: Nutrition education Formal process to instruct or train a patient/client in a skill or to impart knowledge to help patients/clients voluntarily manage or modify food choices and eating behavior to maintain or improve health. Outcome: Completed/Met Date Met:  11/02/15 Nutrition Education Note  RD consulted for nutrition education regarding a low sodium diet and weight loss.  RD provided "Low Sodium Nutrition Therapy" and "Weight loss Tips" handout from the Academy of Nutrition and Dietetics. Reviewed patient's dietary recall. Provided examples on ways to decrease sodium intake in diet. Discouraged intake of processed foods and use of salt shaker. Encouraged fresh fruits and vegetables as well as whole grain sources of carbohydrates to maximize fiber intake. Discussed limiting sugar sweetened beverages. Teach back method used.  Expect good compliance.  Body mass index is 54.83 kg/(m^2). Pt meets criteria for morbid obesity based on current BMI.  Current diet order is heart, patient is consuming approximately 100% of meals at this time. Labs and medications reviewed. No further nutrition interventions warranted at this time. RD contact information provided. If additional nutrition issues arise, please re-consult RD.   Corrin Parker, MS, RD, LDN Pager # [phone] After hours/ weekend pager # [phone]

## 2015-11-03 ENCOUNTER — Inpatient Hospital Stay (HOSPITAL_COMMUNITY): Payer: Self-pay

## 2015-11-03 ENCOUNTER — Encounter (HOSPITAL_COMMUNITY): Payer: Self-pay | Admitting: Physician Assistant

## 2015-11-03 DIAGNOSIS — I501 Left ventricular failure: Secondary | ICD-10-CM | POA: Insufficient documentation

## 2015-11-03 DIAGNOSIS — R0602 Shortness of breath: Secondary | ICD-10-CM

## 2015-11-03 DIAGNOSIS — R600 Localized edema: Secondary | ICD-10-CM

## 2015-11-03 DIAGNOSIS — I10 Essential (primary) hypertension: Secondary | ICD-10-CM

## 2015-11-03 DIAGNOSIS — R609 Edema, unspecified: Secondary | ICD-10-CM

## 2015-11-03 DIAGNOSIS — R0789 Other chest pain: Secondary | ICD-10-CM

## 2015-11-03 DIAGNOSIS — R9431 Abnormal electrocardiogram [ECG] [EKG]: Secondary | ICD-10-CM

## 2015-11-03 DIAGNOSIS — R0609 Other forms of dyspnea: Secondary | ICD-10-CM | POA: Insufficient documentation

## 2015-11-03 LAB — BASIC METABOLIC PANEL
ANION GAP: 14 (ref 5–15)
BUN: 8 mg/dL (ref 6–20)
CHLORIDE: 101 mmol/L (ref 101–111)
CO2: 24 mmol/L (ref 22–32)
Calcium: 9.1 mg/dL (ref 8.9–10.3)
Creatinine, Ser: 1.07 mg/dL (ref 0.61–1.24)
GFR calc Af Amer: >60 mL/min (ref >60)
GFR calc non Af Amer: >60 mL/min (ref >60)
GLUCOSE: 100 mg/dL — AB (ref 65–99)
POTASSIUM: 4.1 mmol/L (ref 3.5–5.1)
Sodium: 139 mmol/L (ref 135–145)

## 2015-11-03 LAB — MAGNESIUM: Magnesium: 1.7 mg/dL (ref 1.7–2.4)

## 2015-11-03 LAB — GLUCOSE, CAPILLARY
GLUCOSE-CAPILLARY: 112 mg/dL — AB (ref 65–99)
Glucose-Capillary: 120 mg/dL — ABNORMAL HIGH (ref 65–99)

## 2015-11-03 MED ORDER — FUROSEMIDE 10 MG/ML IJ SOLN
40.0000 mg | Freq: Three times a day (TID) | INTRAMUSCULAR | Status: DC
  Administered 2015-11-03: 40 mg via INTRAVENOUS
  Filled 2015-11-03: qty 4

## 2015-11-03 MED ORDER — LABETALOL HCL 200 MG PO TABS
200.0000 mg | ORAL_TABLET | Freq: Two times a day (BID) | ORAL | Status: DC
  Administered 2015-11-03 – 2015-11-07 (×8): 200 mg via ORAL
  Filled 2015-11-03 (×12): qty 1

## 2015-11-03 MED ORDER — MAGNESIUM OXIDE 400 (241.3 MG) MG PO TABS
800.0000 mg | ORAL_TABLET | Freq: Once | ORAL | Status: AC
  Administered 2015-11-03: 800 mg via ORAL
  Filled 2015-11-03: qty 2

## 2015-11-03 MED ORDER — FUROSEMIDE 10 MG/ML IJ SOLN
80.0000 mg | Freq: Two times a day (BID) | INTRAMUSCULAR | Status: DC
  Administered 2015-11-03 – 2015-11-05 (×4): 80 mg via INTRAVENOUS
  Filled 2015-11-03 (×6): qty 8

## 2015-11-03 NOTE — Progress Notes (Signed)
Orthopedic Tech Progress Note Patient Details:  Paul Lamb [facility] Jun 01, 1986 [id number]  Ortho Devices Type of Ortho Device: Roland Rack boot Ortho Device/Splint Location: bilateral Ortho Device/Splint Interventions: Application   Nikki Dom 11/03/2015, 5:04 PM

## 2015-11-03 NOTE — Care Management Note (Addendum)
Case Management Note  Patient Details  Name: ELESTER DUNMAN MRN: [id number] Date of Birth: [birth year]  Subjective/Objective:              Admitted with hypertension, pulmonary edema      Action/Plan: Spoke with patient on 11/02/15 and made follow up appointment at [facility] for 11/10/15 at 4pm and made first available appointment for sleep study at sleep disorder center on May 18th at 8pm. They will contact patient if earlier appointment becomes available. Have contacted [facility] regarding CPAP, they are checking to see if patient qualifies for financial assistance. Will follow up with [facility] this am.   11/03/15 Spoke with Judeth Cornfield at [facility], they will be able to provide patient with CPAP. They will deliver the CPAP to patient's home after discharge so patient will have it first night at home.       Expected Discharge Date:                  Expected Discharge Plan:  Home/Self Care  In-House Referral:  Financial Counselor  Discharge planning Services  CM Consult, Follow-up appt scheduled  Post Acute Care Choice:  Durable Medical Equipment Choice offered to:     DME Arranged:    DME Agency:     HH Arranged:    HH Agency:     Status of Service:  In process, will continue to follow  Medicare Important Message Given:    Date Medicare IM Given:    Medicare IM give by:    Date Additional Medicare IM Given:    Additional Medicare Important Message give by:     If discussed at Long Length of Stay Meetings, dates discussed:    Additional Comments:  Monica Becton, RN 11/03/2015, 8:29 AM

## 2015-11-03 NOTE — Progress Notes (Signed)
VASCULAR LAB PRELIMINARY  PRELIMINARY  PRELIMINARY  PRELIMINARY  Bilateral lower extremity venous duplex  completed.    Preliminary report:  Bilateral:  No evidence of DVT, superficial thrombosis, or Baker's Cyst.    Tamaiya Bump, RVT 11/03/2015, 1:29 PM

## 2015-11-03 NOTE — Progress Notes (Signed)
Respiratory care note-  Pt wore CPAP during the night of November 02, 2015 as per night shift RT note.  Pt was placed on CPAP of 8 with 28% fio2.  Sats were noted as 96% with rr20 and HR 98.  No complications were noted.

## 2015-11-03 NOTE — Progress Notes (Signed)
Pt refused condom cath stated he would save urine in urinal

## 2015-11-03 NOTE — Progress Notes (Signed)
Patient Demographics:    Paul Lamb, is a 30 y.o. male, DOB - Feb 08, 1986, WUJ:[id number]  Admit date - 11/01/2015   Admitting Physician Houston Siren, MD  Outpatient Primary MD for the patient is No PCP Per Patient  LOS - 1   Chief Complaint  Patient presents with  . Abdominal Pain  . Hand Pain        Subjective:    [name] today has, No headache, No chest pain, No abdominal pain - No Nausea, No new weakness tingling or numbness, No Cough - Improved shortness of breath.   Assessment  & Plan :     1. Hypertensive emergency with chest tightness and pulmonary edema. Blood pressure much improved, blood pressure regimen titrated, placed on IV Lasix, SOB symptoms resolved, Pending echogram.  2. Mildly elevated troponin. In none serious pattern due to #1 above, check echogram to evaluate wall motion and EF. Currently on beta blocker and aspirin added. Chest pain-free.  3. Morbid obesity with possible undiagnosed obstructive sleep apnea. Had episodes of bradycardia and apnea at night, placed on CPAP, home CPAP ordered per case management, outpatient sleep study also scheduled by case management. Follow with PCP for weight reduction.  4. Elevated d-dimer upon admission. CT angiogram chest unremarkable, lower estimate a venous duplex ordered.  5. Acute on chronic CHF. Likely diastolic with right-sided heart failure as well. Continue IV Lasix and just does again in the morning, continue beta blocker, Imdur, pending echogram, have inform nursing staff to monitor intake and output and daily weight, cardiology consulted as well, shortness of breath has improved and edema has gone down with Unna boot and Lasix.  6. Intermittent left upper quadrant abdominal pain. Resolved. Placed on PPI.  7. Mild L Hand  swelling after trauma - he had trauma with his car door handle, x-ray stable, soft tissue swelling has improved. No signs of fluctuance or infection.     Code Status : Full  Family Communication  : Girlfriend/wife in the room  Disposition Plan  : Likely discharge home in a few days  Consults  :  None  Procedures  :   CTA Lungs - No PE, CHF versus early pneumonia.  CT abdomen pelvis. No acute findings.  Echogram  Venous duplex   DVT Prophylaxis  :  Lovenox    Lab Results  Component Value Date   PLT 264 11/02/2015    Inpatient Medications  Scheduled Meds: . amLODipine  10 mg Oral Daily  . enoxaparin (LOVENOX) injection  90 mg Subcutaneous Q24H  . famotidine  20 mg Oral QHS  . furosemide  40 mg Intravenous TID  . hydrALAZINE  100 mg Oral 3 times per day  . isosorbide mononitrate  60 mg Oral Daily  . labetalol  100 mg Oral BID  . levofloxacin  750 mg Oral Q24H  . lisinopril  20 mg Oral Daily   Continuous Infusions:  PRN Meds:.acetaminophen **OR** [DISCONTINUED] acetaminophen, guaiFENesin, hydrALAZINE, HYDROcodone-acetaminophen, [DISCONTINUED] ondansetron **OR** ondansetron (ZOFRAN) IV  Antibiotics  :    Anti-infectives    Start     Dose/Rate Route Frequency Ordered Stop   11/02/15 1000  levofloxacin (LEVAQUIN) tablet 750 mg     750 mg Oral Every 24 hours 11/01/15  1601 11/07/15 0959   11/02/15 0000  levofloxacin (LEVAQUIN) tablet 750 mg  Status:  Discontinued     750 mg Oral Every 24 hours 11/01/15 1554 11/01/15 1601   11/01/15 1145  cefTRIAXone (ROCEPHIN) 1 g in dextrose 5 % 50 mL IVPB     1 g 100 mL/hr over 30 Minutes Intravenous  Once 11/01/15 1137 11/01/15 1335   11/01/15 1145  azithromycin (ZITHROMAX) 500 mg in dextrose 5 % 250 mL IVPB     500 mg 250 mL/hr over 60 Minutes Intravenous  Once 11/01/15 1137 11/01/15 1549        Objective:   Filed Vitals:   11/02/15 1700 11/02/15 2300 11/03/15 0500 11/03/15 1031  BP: 143/80 142/86 137/85 137/85  Pulse:  78 91 90   Temp: 97.1 F (36.2 C) 98.1 F (36.7 C) 97.6 F (36.4 C)   TempSrc: Oral Oral Oral   Resp: 20 20 20    Height:      Weight:   193.232 kg (426 lb)   SpO2: 99% 92% 96%     Wt Readings from Last 3 Encounters:  11/03/15 193.232 kg (426 lb)  06/22/15 162.388 kg (358 lb)  09/29/11 157.942 kg (348 lb 3.2 oz)     Intake/Output Summary (Last 24 hours) at 11/03/15 1130 Last data filed at 11/03/15 1100  Gross per 24 hour  Intake    740 ml  Output   1400 ml  Net   -660 ml     Physical Exam  Awake Alert, Oriented X 3, No new F.N deficits, Normal affect [place].AT,PERRAL Supple Neck,No JVD, No cervical lymphadenopathy appriciated.  Symmetrical Chest wall movement, Good air movement bilaterally, +VE RALES RRR,No Gallops,Rubs or new Murmurs, No Parasternal Heave +ve B.Sounds, Abd Soft, No tenderness, No organomegaly appriciated, No rebound - guarding or rigidity. No Cyanosis, Clubbing , 2+ edema with UNNA boots, No new Rash or bruise       Data Review:   Micro Results Recent Results (from the past 240 hour(s))  Culture, blood (routine x 2) Call MD if unable to obtain prior to antibiotics being given     Status: None (Preliminary result)   Collection Time: 11/01/15  6:12 PM  Result Value Ref Range Status   Specimen Description BLOOD RIGHT ANTECUBITAL  Final   Special Requests BOTTLES DRAWN AEROBIC AND ANAEROBIC 5CC  Final   Culture NO GROWTH < 24 HOURS  Final   Report Status PENDING  Incomplete  Culture, blood (routine x 2) Call MD if unable to obtain prior to antibiotics being given     Status: None (Preliminary result)   Collection Time: 11/01/15  6:24 PM  Result Value Ref Range Status   Specimen Description BLOOD RIGHT ANTECUBITAL  Final   Special Requests BOTTLES DRAWN AEROBIC AND ANAEROBIC 5CC  Final   Culture NO GROWTH < 24 HOURS  Final   Report Status PENDING  Incomplete    Radiology Reports Dg Chest 2 View  11/01/2015  CLINICAL DATA:  30 year old male with  history of central non radiating chest pain and shortness breath for the past 3 days. Cough. EXAM: CHEST  2 VIEW COMPARISON:  Chest x-ray 05/23/2004. FINDINGS: Diffuse peribronchial cuffing. Patchy airspace disease predominantly throughout the right mid to lower lung. Left lung appears relatively clear. No pneumothorax. No suspicious appearing pulmonary nodules or masses. No evidence of pulmonary edema. Heart size is moderately enlarged (new compared to the prior study). Mild elevation of the right hemidiaphragm. Upper mediastinal contours are  within normal limits. IMPRESSION: 1. Peribronchial cuffing with patchy airspace disease throughout the right mid to lower lung, concerning for bronchitis with multilobar bronchopneumonia, most evident in the right middle and lower lobes. 2. In addition, however, there is also now moderate cardiomegaly which is new compared to the prior study. In this age group, clinical correlation for nonischemic cardiomyopathy is recommended, such as viral myocarditis. Electronically Signed   By: Trudie Reed M.D.   On: 11/01/2015 11:32   Ct Angio Chest Pe W/cm &/or Wo Cm  11/01/2015  CLINICAL DATA:  Shortness of breath, abdominal pain. Evaluate for pulmonary embolus or dissection. EXAM: CT ANGIOGRAPHY CHEST, ABDOMEN AND PELVIS TECHNIQUE: Multidetector CT imaging through the chest, abdomen and pelvis was performed using the standard protocol during bolus administration of intravenous contrast. Multiplanar reconstructed images and MIPs were obtained and reviewed to evaluate the vascular anatomy. CONTRAST:  OMNIPAQUE IOHEXOL 350 MG/ML SOLN COMPARISON:  None. FINDINGS: CTA CHEST FINDINGS Mediastinum/Nodes: There is cardiomegaly. Small pericardial effusion. Aorta is normal caliber. No dissection. No filling defects in the pulmonary arteries to suggest pulmonary emboli. No mediastinal, hilar, or axillary adenopathy. Lungs/Pleura: Mild hazy opacities within the lungs could reflect  early edema. Tree-in-bud densities are noted in the inferior right upper lobe and within the right lower lobe concerning for early bronchopneumonia. No confluent opacity on the left. No effusions. Musculoskeletal: Chest wall soft tissues are unremarkable. No acute bony abnormality. Review of the MIP images confirms the above findings. CTA ABDOMEN AND PELVIS FINDINGS Hepatobiliary: Unremarkable appearance.  Gallbladder unremarkable. Pancreas: Unremarkable appearance Spleen: Unremarkable appearance Adrenals/Urinary Tract: No adrenal abnormality. No focal renal abnormality. No stones or hydronephrosis. Urinary bladder is unremarkable. Stomach/Bowel: Normal appendix. Stomach, large and small bowel grossly unremarkable. Vascular/Lymphatic: No evidence of aneurysm or adenopathy. No evidence of dissection. Mesenteric vessels and single renal arteries are widely patent bilaterally. Reproductive: No abnormality. Other: No free fluid or free air. Musculoskeletal: No acute bony abnormality. Review of the MIP images confirms the above findings. IMPRESSION: No evidence of pulmonary embolus. No evidence of aortic aneurysm or dissection. Cardiomegaly, small pericardial effusion. Mild ground-glass opacities within the lungs could reflect early edema. Or focal tree-in-bud densities within the inferior right upper lobe and adjacent right lower lobe. This could reflect early bronchopneumonia. No acute findings in the abdomen or pelvis. Electronically Signed   By: Charlett Nose M.D.   On: 11/01/2015 12:58   Ct Abdomen Pelvis W Contrast  11/01/2015  CLINICAL DATA:  Shortness of breath, abdominal pain. Evaluate for pulmonary embolus or dissection. EXAM: CT ANGIOGRAPHY CHEST, ABDOMEN AND PELVIS TECHNIQUE: Multidetector CT imaging through the chest, abdomen and pelvis was performed using the standard protocol during bolus administration of intravenous contrast. Multiplanar reconstructed images and MIPs were obtained and reviewed to  evaluate the vascular anatomy. CONTRAST:  OMNIPAQUE IOHEXOL 350 MG/ML SOLN COMPARISON:  None. FINDINGS: CTA CHEST FINDINGS Mediastinum/Nodes: There is cardiomegaly. Small pericardial effusion. Aorta is normal caliber. No dissection. No filling defects in the pulmonary arteries to suggest pulmonary emboli. No mediastinal, hilar, or axillary adenopathy. Lungs/Pleura: Mild hazy opacities within the lungs could reflect early edema. Tree-in-bud densities are noted in the inferior right upper lobe and within the right lower lobe concerning for early bronchopneumonia. No confluent opacity on the left. No effusions. Musculoskeletal: Chest wall soft tissues are unremarkable. No acute bony abnormality. Review of the MIP images confirms the above findings. CTA ABDOMEN AND PELVIS FINDINGS Hepatobiliary: Unremarkable appearance.  Gallbladder unremarkable. Pancreas: Unremarkable appearance Spleen:  Unremarkable appearance Adrenals/Urinary Tract: No adrenal abnormality. No focal renal abnormality. No stones or hydronephrosis. Urinary bladder is unremarkable. Stomach/Bowel: Normal appendix. Stomach, large and small bowel grossly unremarkable. Vascular/Lymphatic: No evidence of aneurysm or adenopathy. No evidence of dissection. Mesenteric vessels and single renal arteries are widely patent bilaterally. Reproductive: No abnormality. Other: No free fluid or free air. Musculoskeletal: No acute bony abnormality. Review of the MIP images confirms the above findings. IMPRESSION: No evidence of pulmonary embolus. No evidence of aortic aneurysm or dissection. Cardiomegaly, small pericardial effusion. Mild ground-glass opacities within the lungs could reflect early edema. Or focal tree-in-bud densities within the inferior right upper lobe and adjacent right lower lobe. This could reflect early bronchopneumonia. No acute findings in the abdomen or pelvis. Electronically Signed   By: Charlett Nose M.D.   On: 11/01/2015 12:58   Dg Hand  Complete Left  11/02/2015  CLINICAL DATA:  Woke up with chest tightness and pain/swelling in both hands, initial encounter EXAM: LEFT HAND - COMPLETE 3+ VIEW COMPARISON:  None FINDINGS: Diffuse soft tissue swelling. Osseous mineralization normal. Joint spaces preserved. No acute fracture, dislocation, or definite bone destruction. IMPRESSION: Significant soft tissue swelling without acute osseous abnormalities. Electronically Signed   By: Ulyses Southward M.D.   On: 11/02/2015 14:05     CBC  Recent Labs Lab 11/01/15 1043 11/02/15 0339  WBC 8.8 7.0  HGB 13.3 12.0*  HCT 39.3 36.6*  PLT 304 264  MCV 66.7* 67.3*  MCH 22.6* 22.1*  MCHC 33.8 32.8  RDW 15.6* 15.7*  LYMPHSABS 1.9  --   MONOABS 0.6  --   EOSABS 0.1  --   BASOSABS 0.1  --     Chemistries   Recent Labs Lab 11/01/15 1043 11/02/15 0339 11/03/15 0600  NA 140 141 139  K 4.9 4.2 4.1  CL 102 104 101  CO2 26 24 24   GLUCOSE 122* 108* 100*  BUN 13 10 8   CREATININE 1.26* 1.08 1.07  CALCIUM 9.2 8.9 9.1  MG  --   --  1.7  AST 23  --   --   ALT 20  --   --   ALKPHOS 69  --   --   BILITOT 1.8*  --   --    ------------------------------------------------------------------------------------------------------------------ No results for input(s): CHOL, HDL, LDLCALC, TRIG, CHOLHDL, LDLDIRECT in the last 72 hours.  No results found for: HGBA1C ------------------------------------------------------------------------------------------------------------------ No results for input(s): TSH, T4TOTAL, T3FREE, THYROIDAB in the last 72 hours.  Invalid input(s): FREET3 ------------------------------------------------------------------------------------------------------------------ No results for input(s): VITAMINB12, FOLATE, FERRITIN, TIBC, IRON, RETICCTPCT in the last 72 hours.  Coagulation profile No results for input(s): INR, PROTIME in the last 168 hours.   Recent Labs  11/01/15 1104  DDIMER 2.03*    Cardiac  Enzymes  Recent Labs Lab 11/01/15 1547 11/01/15 2047 11/02/15 0339  TROPONINI 0.09* 0.07* 0.07*   ------------------------------------------------------------------------------------------------------------------    Component Value Date/Time   BNP 300.7* 11/01/2015 1245    Time Spent in minutes  35   SINGH,PRASHANT K M.D on 11/03/2015 at 11:30 AM  Between 7am to 7pm - Pager - [phone]  After 7pm go to [email or web address] - password [facility]  [facility] -  Office  [phone]

## 2015-11-03 NOTE — Consult Note (Signed)
CARDIOLOGY CONSULT NOTE   Patient ID: Paul Lamb MRN: [id number], DOB/AGE: May 16, 1986   Admit date: 11/01/2015 Date of Consult: 11/03/2015   Primary Physician: No PCP Per Patient Primary Cardiologist: new  Pt. Profile  [name] is a morbidly obese 30 year old African-American male with past medical history of hypertension presented with diffuse swelling and SOB in the setting of SBP > 200  Problem List  Past Medical History  Diagnosis Date  . Hypertension     Past Surgical History  Procedure Laterality Date  . No past surgeries       Allergies  No Known Allergies  HPI   [name] is a morbidly obese 30 year old African-American male with past medical history of hypertension. According to the patient, he has been taking antihypertensive since middle school. He likely also has undiagnosed obstructive sleep apnea. He does not have a PCP, he gets his blood pressure medication from [facility] urgent [facility]. The last time he took all of his blood pressure medication was Saturday. He did not take his blood pressure medication on Sunday. On Sunday he started noticing his hands and legs started swelling up. His blood pressure was over 200. He also started having shortness of breath which prompted him to seek medical attention at [facility]. He did have some mild chest discomfort which occurred in the setting of dyspnea.  Initial chest x-ray was concerning for pulmonary edema. He was fluid overloaded on exam. Initially he also complained of left upper quadrant or any sensation, CT of the chest and abdomen was obtained which did not reveal any acute process. Urinalysis shows significant proteinuria. He was placed on IV diuretic with improvement of symptoms. Echo was ordered and is currently pending. Cardiology has been consulted for acute on chronic diastolic heart failure in the setting of uncontrolled high blood pressure.    Inpatient Medications  . amLODipine   10 mg Oral Daily  . enoxaparin (LOVENOX) injection  90 mg Subcutaneous Q24H  . famotidine  20 mg Oral QHS  . furosemide  80 mg Intravenous BID  . hydrALAZINE  100 mg Oral 3 times per day  . isosorbide mononitrate  60 mg Oral Daily  . labetalol  200 mg Oral BID  . levofloxacin  750 mg Oral Q24H  . lisinopril  20 mg Oral Daily  . magnesium oxide  800 mg Oral Once    Family History Family History  Problem Relation Age of Onset  . Hypertension    . Diabetes Mellitus II       Social History Social History   Social History  . Marital Status: Single    Spouse Name: N/A  . Number of Children: N/A  . Years of Education: N/A   Occupational History  . Not on file.   Social History Main Topics  . Smoking status: Never Smoker   . Smokeless tobacco: Never Used  . Alcohol Use: No  . Drug Use: No  . Sexual Activity: Not on file   Other Topics Concern  . Not on file   Social History Narrative     Review of Systems  General:  No chills, fever, night sweats or weight changes.  Cardiovascular:  No orthopnea, palpitations, paroxysmal nocturnal dyspnea. +chest pain, dyspnea on exertion, edema Dermatological: No rash, lesions/masses Respiratory: No cough +dyspnea Urologic: No hematuria, dysuria Abdominal:   No nausea, vomiting, diarrhea, bright red blood per rectum, melena, or hematemesis Neurologic:  No visual changes, wkns, changes in mental status.  All other systems reviewed and are otherwise negative except as noted above.  Physical Exam  Blood pressure 137/85, pulse 90, temperature 97.6 F (36.4 C), temperature source Oral, resp. rate 20, height 6\' 2"  (1.88 m), weight 426 lb (193.232 kg), SpO2 96 %.  General: Pleasant, NAD Psych: Normal affect. Neuro: Alert and oriented X 3. Moves all extremities spontaneously. HEENT: Normal  Neck: Supple without bruits or JVD. Lungs:  Resp regular and unlabored, CTA. Heart: RRR no s3, s4, or murmurs. Abdomen: Soft, non-tender,  non-distended, BS + x 4.  Extremities: No clubbing, cyanosis. DP/PT/Radials 2+ and equal bilaterally. 2+ pitting edema  Labs   Recent Labs  11/01/15 1547 11/01/15 2047 11/02/15 0339  TROPONINI 0.09* 0.07* 0.07*   Lab Results  Component Value Date   WBC 7.0 11/02/2015   HGB 12.0* 11/02/2015   HCT 36.6* 11/02/2015   MCV 67.3* 11/02/2015   PLT 264 11/02/2015    Recent Labs Lab 11/01/15 1043  11/03/15 0600  NA 140  < > 139  K 4.9  < > 4.1  CL 102  < > 101  CO2 26  < > 24  BUN 13  < > 8  CREATININE 1.26*  < > 1.07  CALCIUM 9.2  < > 9.1  PROT 6.8  --   --   BILITOT 1.8*  --   --   ALKPHOS 69  --   --   ALT 20  --   --   AST 23  --   --   GLUCOSE 122*  < > 100*  < > = values in this interval not displayed. No results found for: CHOL, HDL, LDLCALC, TRIG Lab Results  Component Value Date   DDIMER 2.03* 11/01/2015    Radiology/Studies  Dg Chest 2 View  11/01/2015  CLINICAL DATA:  30 year old male with history of central non radiating chest pain and shortness breath for the past 3 days. Cough. EXAM: CHEST  2 VIEW COMPARISON:  Chest x-ray 05/23/2004. FINDINGS: Diffuse peribronchial cuffing. Patchy airspace disease predominantly throughout the right mid to lower lung. Left lung appears relatively clear. No pneumothorax. No suspicious appearing pulmonary nodules or masses. No evidence of pulmonary edema. Heart size is moderately enlarged (new compared to the prior study). Mild elevation of the right hemidiaphragm. Upper mediastinal contours are within normal limits. IMPRESSION: 1. Peribronchial cuffing with patchy airspace disease throughout the right mid to lower lung, concerning for bronchitis with multilobar bronchopneumonia, most evident in the right middle and lower lobes. 2. In addition, however, there is also now moderate cardiomegaly which is new compared to the prior study. In this age group, clinical correlation for nonischemic cardiomyopathy is recommended, such as viral  myocarditis. Electronically Signed   By: Trudie Reed M.D.   On: 11/01/2015 11:32   Ct Angio Chest Pe W/cm &/or Wo Cm  11/01/2015  CLINICAL DATA:  Shortness of breath, abdominal pain. Evaluate for pulmonary embolus or dissection. EXAM: CT ANGIOGRAPHY CHEST, ABDOMEN AND PELVIS TECHNIQUE: Multidetector CT imaging through the chest, abdomen and pelvis was performed using the standard protocol during bolus administration of intravenous contrast. Multiplanar reconstructed images and MIPs were obtained and reviewed to evaluate the vascular anatomy. CONTRAST:  OMNIPAQUE IOHEXOL 350 MG/ML SOLN COMPARISON:  None. FINDINGS: CTA CHEST FINDINGS Mediastinum/Nodes: There is cardiomegaly. Small pericardial effusion. Aorta is normal caliber. No dissection. No filling defects in the pulmonary arteries to suggest pulmonary emboli. No mediastinal, hilar, or axillary adenopathy. Lungs/Pleura: Mild hazy opacities within the  lungs could reflect early edema. Tree-in-bud densities are noted in the inferior right upper lobe and within the right lower lobe concerning for early bronchopneumonia. No confluent opacity on the left. No effusions. Musculoskeletal: Chest wall soft tissues are unremarkable. No acute bony abnormality. Review of the MIP images confirms the above findings. CTA ABDOMEN AND PELVIS FINDINGS Hepatobiliary: Unremarkable appearance.  Gallbladder unremarkable. Pancreas: Unremarkable appearance Spleen: Unremarkable appearance Adrenals/Urinary Tract: No adrenal abnormality. No focal renal abnormality. No stones or hydronephrosis. Urinary bladder is unremarkable. Stomach/Bowel: Normal appendix. Stomach, large and small bowel grossly unremarkable. Vascular/Lymphatic: No evidence of aneurysm or adenopathy. No evidence of dissection. Mesenteric vessels and single renal arteries are widely patent bilaterally. Reproductive: No abnormality. Other: No free fluid or free air. Musculoskeletal: No acute bony abnormality.  Review of the MIP images confirms the above findings. IMPRESSION: No evidence of pulmonary embolus. No evidence of aortic aneurysm or dissection. Cardiomegaly, small pericardial effusion. Mild ground-glass opacities within the lungs could reflect early edema. Or focal tree-in-bud densities within the inferior right upper lobe and adjacent right lower lobe. This could reflect early bronchopneumonia. No acute findings in the abdomen or pelvis. Electronically Signed   By: Charlett Nose M.D.   On: 11/01/2015 12:58   Ct Abdomen Pelvis W Contrast  11/01/2015  CLINICAL DATA:  Shortness of breath, abdominal pain. Evaluate for pulmonary embolus or dissection. EXAM: CT ANGIOGRAPHY CHEST, ABDOMEN AND PELVIS TECHNIQUE: Multidetector CT imaging through the chest, abdomen and pelvis was performed using the standard protocol during bolus administration of intravenous contrast. Multiplanar reconstructed images and MIPs were obtained and reviewed to evaluate the vascular anatomy. CONTRAST:  OMNIPAQUE IOHEXOL 350 MG/ML SOLN COMPARISON:  None. FINDINGS: CTA CHEST FINDINGS Mediastinum/Nodes: There is cardiomegaly. Small pericardial effusion. Aorta is normal caliber. No dissection. No filling defects in the pulmonary arteries to suggest pulmonary emboli. No mediastinal, hilar, or axillary adenopathy. Lungs/Pleura: Mild hazy opacities within the lungs could reflect early edema. Tree-in-bud densities are noted in the inferior right upper lobe and within the right lower lobe concerning for early bronchopneumonia. No confluent opacity on the left. No effusions. Musculoskeletal: Chest wall soft tissues are unremarkable. No acute bony abnormality. Review of the MIP images confirms the above findings. CTA ABDOMEN AND PELVIS FINDINGS Hepatobiliary: Unremarkable appearance.  Gallbladder unremarkable. Pancreas: Unremarkable appearance Spleen: Unremarkable appearance Adrenals/Urinary Tract: No adrenal abnormality. No focal renal  abnormality. No stones or hydronephrosis. Urinary bladder is unremarkable. Stomach/Bowel: Normal appendix. Stomach, large and small bowel grossly unremarkable. Vascular/Lymphatic: No evidence of aneurysm or adenopathy. No evidence of dissection. Mesenteric vessels and single renal arteries are widely patent bilaterally. Reproductive: No abnormality. Other: No free fluid or free air. Musculoskeletal: No acute bony abnormality. Review of the MIP images confirms the above findings. IMPRESSION: No evidence of pulmonary embolus. No evidence of aortic aneurysm or dissection. Cardiomegaly, small pericardial effusion. Mild ground-glass opacities within the lungs could reflect early edema. Or focal tree-in-bud densities within the inferior right upper lobe and adjacent right lower lobe. This could reflect early bronchopneumonia. No acute findings in the abdomen or pelvis. Electronically Signed   By: Charlett Nose M.D.   On: 11/01/2015 12:58   Dg Hand Complete Left  11/02/2015  CLINICAL DATA:  Woke up with chest tightness and pain/swelling in both hands, initial encounter EXAM: LEFT HAND - COMPLETE 3+ VIEW COMPARISON:  None FINDINGS: Diffuse soft tissue swelling. Osseous mineralization normal. Joint spaces preserved. No acute fracture, dislocation, or definite bone destruction. IMPRESSION: Significant soft tissue swelling  without acute osseous abnormalities. Electronically Signed   By: Ulyses Southward M.D.   On: 11/02/2015 14:05    ECG  Sinus tach without significant ST-T wave changes  ASSESSMENT AND PLAN  1. Acute on chronic diastolic HF in the setting of malignant HTN  - pending echocardiogram. Increase IV lasix to 80mg  BID. Lung clear on exam, however still has 2+ pitting edema in the legs.  - discussed with the patient regarding fluid restriction and salt restriction. Will need to establish with a PCP  - mildly elevated trop likely demand ischemia related to malignant HTN.  2. Malignant HTN: related to missed  BP med  - likely related to obesity and OSA  - continue amlodipine, hydralazine, Imdur, lisinopril, increase labetalol to 200mg  BID  3. Undiagnosed OSA: need sleep study as outpatient  4. Morbid obesity  5. Proteinuria: related to HTN   Signed, Azalee Course, PA-C 11/03/2015, 3:18 PM

## 2015-11-04 ENCOUNTER — Inpatient Hospital Stay (HOSPITAL_COMMUNITY): Payer: Self-pay

## 2015-11-04 DIAGNOSIS — R791 Abnormal coagulation profile: Secondary | ICD-10-CM

## 2015-11-04 DIAGNOSIS — I509 Heart failure, unspecified: Secondary | ICD-10-CM

## 2015-11-04 DIAGNOSIS — R7989 Other specified abnormal findings of blood chemistry: Secondary | ICD-10-CM | POA: Insufficient documentation

## 2015-11-04 DIAGNOSIS — R0602 Shortness of breath: Secondary | ICD-10-CM | POA: Insufficient documentation

## 2015-11-04 DIAGNOSIS — R748 Abnormal levels of other serum enzymes: Secondary | ICD-10-CM

## 2015-11-04 DIAGNOSIS — N179 Acute kidney failure, unspecified: Secondary | ICD-10-CM | POA: Insufficient documentation

## 2015-11-04 DIAGNOSIS — I119 Hypertensive heart disease without heart failure: Secondary | ICD-10-CM | POA: Insufficient documentation

## 2015-11-04 LAB — BASIC METABOLIC PANEL
ANION GAP: 11 (ref 5–15)
BUN: 7 mg/dL (ref 6–20)
CHLORIDE: 101 mmol/L (ref 101–111)
CO2: 26 mmol/L (ref 22–32)
Calcium: 9.2 mg/dL (ref 8.9–10.3)
Creatinine, Ser: 1.08 mg/dL (ref 0.61–1.24)
GFR calc Af Amer: >60 mL/min (ref >60)
GFR calc non Af Amer: >60 mL/min (ref >60)
Glucose, Bld: 105 mg/dL — ABNORMAL HIGH (ref 65–99)
POTASSIUM: 3.9 mmol/L (ref 3.5–5.1)
SODIUM: 138 mmol/L (ref 135–145)

## 2015-11-04 LAB — ECHOCARDIOGRAM COMPLETE
HEIGHTINCHES: 74 in
WEIGHTICAEL: 6379.2 [oz_av]

## 2015-11-04 LAB — MAGNESIUM: MAGNESIUM: 1.9 mg/dL (ref 1.7–2.4)

## 2015-11-04 MED ORDER — PRO-STAT SUGAR FREE PO LIQD
30.0000 mL | Freq: Two times a day (BID) | ORAL | Status: DC
  Administered 2015-11-04 – 2015-11-07 (×7): 30 mL via ORAL
  Filled 2015-11-04 (×7): qty 30

## 2015-11-04 NOTE — Progress Notes (Signed)
  Echocardiogram 2D Echocardiogram has been performed.  Paul Lamb 11/04/2015, 10:10 AM

## 2015-11-04 NOTE — Progress Notes (Signed)
Patient Demographics:    Paul Lamb, is a 30 y.o. male, DOB - 23-Mar-1986, ZOX:[id number]  Admit date - 11/01/2015   Admitting Physician Paul Siren, MD  Outpatient Primary MD for the patient is No PCP Per Patient  LOS - 2   Chief Complaint  Patient presents with  . Abdominal Pain  . Hand Pain        Subjective:    Paul Lamb today has, No headache, No chest pain, No abdominal pain - No Nausea, No new weakness tingling or numbness, No Cough - Improved shortness of breath. Lower extremity swelling has gone down considerably.   Assessment  & Plan :     1. Hypertensive emergency with chest tightness and pulmonary edema. Blood pressure much improved, blood pressure regimen titrated with good effect. He is currently on beta blocker, Norvasc, Imdur along with ACE inhibitor, placed on IV Lasix, SOB symptoms resolved, Pending echogram.  2. Mildly elevated troponin. In none serious pattern due to #1 above, check echogram to evaluate wall motion and EF. Currently on beta blocker and aspirin added. Chest pain-free. Cards following.  3. Morbid obesity with possible undiagnosed obstructive sleep apnea. Had episodes of bradycardia and apnea at night, placed on CPAP, home CPAP ordered per case management, outpatient sleep study also scheduled by case management. Follow with PCP for weight reduction.  4. Elevated d-dimer upon admission. CT angiogram chest unremarkable, Lower extremity venous duplex negative.  5. Acute on chronic CHF. Likely diastolic with right-sided heart failure as well. Continue IV Lasix and just does again in the morning, he is so far over 6 L negative (first day urine output not charted ), weight is down close to 10 pounds, doubt the actual charted weight is correct, continue beta blocker,  Imdur, pending echogram, have inform nursing staff to monitor intake and output and daily weight, cardiology consulted as well, shortness of breath has improved and edema has gone down with Unna boot and Lasix, continue IV lasix another 1-2 days.  Filed Weights   11/02/15 0100 11/03/15 0500 11/04/15 0500  Weight: 193.8 kg (427 lb 4 oz) 193.232 kg (426 lb) 180.849 kg (398 lb 11.2 oz)    6. Intermittent left upper quadrant abdominal pain. Resolved. Placed on PPI.  7. Mild L Hand swelling after trauma - he had trauma with his car door handle, x-ray stable, soft tissue swelling has improved. No signs of fluctuance or infection. Swelling almost completely resolved.     Code Status : Full  Family Communication  : Girlfriend/wife in the room  Disposition Plan  : Likely discharge home in a few days  Consults  :  None  Procedures  :   CTA Lungs - No PE, CHF versus early pneumonia.  CT abdomen pelvis. No acute findings.  Echogram - pending  Venous duplex - no DVT   DVT Prophylaxis  :  Lovenox    Lab Results  Component Value Date   PLT 264 11/02/2015    Inpatient Medications  Scheduled Meds: . amLODipine  10 mg Oral Daily  . enoxaparin (LOVENOX) injection  90 mg Subcutaneous Q24H  . famotidine  20 mg Oral QHS  . furosemide  80 mg Intravenous BID  . hydrALAZINE  100 mg Oral  3 times per day  . isosorbide mononitrate  60 mg Oral Daily  . labetalol  200 mg Oral BID  . levofloxacin  750 mg Oral Q24H  . lisinopril  20 mg Oral Daily   Continuous Infusions:  PRN Meds:.acetaminophen **OR** [DISCONTINUED] acetaminophen, guaiFENesin, hydrALAZINE, HYDROcodone-acetaminophen, [DISCONTINUED] ondansetron **OR** ondansetron (ZOFRAN) IV  Antibiotics  :    Anti-infectives    Start     Dose/Rate Route Frequency Ordered Stop   11/02/15 1000  levofloxacin (LEVAQUIN) tablet 750 mg     750 mg Oral Every 24 hours 11/01/15 1601 11/07/15 0959   11/02/15 0000  levofloxacin (LEVAQUIN) tablet  750 mg  Status:  Discontinued     750 mg Oral Every 24 hours 11/01/15 1554 11/01/15 1601   11/01/15 1145  cefTRIAXone (ROCEPHIN) 1 g in dextrose 5 % 50 mL IVPB     1 g 100 mL/hr over 30 Minutes Intravenous  Once 11/01/15 1137 11/01/15 1335   11/01/15 1145  azithromycin (ZITHROMAX) 500 mg in dextrose 5 % 250 mL IVPB     500 mg 250 mL/hr over 60 Minutes Intravenous  Once 11/01/15 1137 11/01/15 1549        Objective:   Filed Vitals:   11/03/15 1635 11/03/15 1645 11/03/15 2100 11/04/15 0500  BP: 137/85 137/66 132/65 143/62  Pulse:   101 99  Temp:   99 F (37.2 C) 99.8 F (37.7 C)  TempSrc:   Oral Oral  Resp:   20 20  Height:      Weight:    180.849 kg (398 lb 11.2 oz)  SpO2:   95% 94%    Wt Readings from Last 3 Encounters:  11/04/15 180.849 kg (398 lb 11.2 oz)  06/22/15 162.388 kg (358 lb)  09/29/11 157.942 kg (348 lb 3.2 oz)     Intake/Output Summary (Last 24 hours) at 11/04/15 0853 Last data filed at 11/04/15 0500  Gross per 24 hour  Intake    565 ml  Output   4550 ml  Net  -3985 ml     Physical Exam  Awake Alert, Oriented X 3, No new F.N deficits, Normal affect Paul Lamb,PERRAL Supple Neck,No JVD, No cervical lymphadenopathy appriciated.  Symmetrical Chest wall movement, Good air movement bilaterally, minimal RALES RRR,No Gallops,Rubs or new Murmurs, No Parasternal Heave +ve B.Sounds, Abd Soft, No tenderness, No organomegaly appriciated, No rebound - guarding or rigidity. No Cyanosis, Clubbing , 1+ edema with UNNA boots, No new Rash or bruise       Data Review:   Micro Results Recent Results (from the past 240 hour(s))  Culture, blood (routine x 2) Call MD if unable to obtain prior to antibiotics being given     Status: None (Preliminary result)   Collection Time: 11/01/15  6:12 PM  Result Value Ref Range Status   Specimen Description BLOOD RIGHT ANTECUBITAL  Final   Special Requests BOTTLES DRAWN AEROBIC AND ANAEROBIC 5CC  Final   Culture NO GROWTH 2 DAYS   Final   Report Status PENDING  Incomplete  Culture, blood (routine x 2) Call MD if unable to obtain prior to antibiotics being given     Status: None (Preliminary result)   Collection Time: 11/01/15  6:24 PM  Result Value Ref Range Status   Specimen Description BLOOD RIGHT ANTECUBITAL  Final   Special Requests BOTTLES DRAWN AEROBIC AND ANAEROBIC 5CC  Final   Culture NO GROWTH 2 DAYS  Final   Report Status PENDING  Incomplete  Radiology Reports Dg Chest 2 View  11/01/2015  CLINICAL DATA:  30 year old male with history of central non radiating chest pain and shortness breath for the past 3 days. Cough. EXAM: CHEST  2 VIEW COMPARISON:  Chest x-ray 05/23/2004. FINDINGS: Diffuse peribronchial cuffing. Patchy airspace disease predominantly throughout the right mid to lower lung. Left lung appears relatively clear. No pneumothorax. No suspicious appearing pulmonary nodules or masses. No evidence of pulmonary edema. Heart size is moderately enlarged (new compared to the prior study). Mild elevation of the right hemidiaphragm. Upper mediastinal contours are within normal limits. IMPRESSION: 1. Peribronchial cuffing with patchy airspace disease throughout the right mid to lower lung, concerning for bronchitis with multilobar bronchopneumonia, most evident in the right middle and lower lobes. 2. In addition, however, there is also now moderate cardiomegaly which is new compared to the prior study. In this age group, clinical correlation for nonischemic cardiomyopathy is recommended, such as viral myocarditis. Electronically Signed   By: Trudie Reed M.D.   On: 11/01/2015 11:32   Ct Angio Chest Pe W/cm &/or Wo Cm  11/01/2015  CLINICAL DATA:  Shortness of breath, abdominal pain. Evaluate for pulmonary embolus or dissection. EXAM: CT ANGIOGRAPHY CHEST, ABDOMEN AND PELVIS TECHNIQUE: Multidetector CT imaging through the chest, abdomen and pelvis was performed using the standard protocol during bolus  administration of intravenous contrast. Multiplanar reconstructed images and MIPs were obtained and reviewed to evaluate the vascular anatomy. CONTRAST:  OMNIPAQUE IOHEXOL 350 MG/ML SOLN COMPARISON:  None. FINDINGS: CTA CHEST FINDINGS Mediastinum/Nodes: There is cardiomegaly. Small pericardial effusion. Aorta is normal caliber. No dissection. No filling defects in the pulmonary arteries to suggest pulmonary emboli. No mediastinal, hilar, or axillary adenopathy. Lungs/Pleura: Mild hazy opacities within the lungs could reflect early edema. Tree-in-bud densities are noted in the inferior right upper lobe and within the right lower lobe concerning for early bronchopneumonia. No confluent opacity on the left. No effusions. Musculoskeletal: Chest wall soft tissues are unremarkable. No acute bony abnormality. Review of the MIP images confirms the above findings. CTA ABDOMEN AND PELVIS FINDINGS Hepatobiliary: Unremarkable appearance.  Gallbladder unremarkable. Pancreas: Unremarkable appearance Spleen: Unremarkable appearance Adrenals/Urinary Tract: No adrenal abnormality. No focal renal abnormality. No stones or hydronephrosis. Urinary bladder is unremarkable. Stomach/Bowel: Normal appendix. Stomach, large and small bowel grossly unremarkable. Vascular/Lymphatic: No evidence of aneurysm or adenopathy. No evidence of dissection. Mesenteric vessels and single renal arteries are widely patent bilaterally. Reproductive: No abnormality. Other: No free fluid or free air. Musculoskeletal: No acute bony abnormality. Review of the MIP images confirms the above findings. IMPRESSION: No evidence of pulmonary embolus. No evidence of aortic aneurysm or dissection. Cardiomegaly, small pericardial effusion. Mild ground-glass opacities within the lungs could reflect early edema. Or focal tree-in-bud densities within the inferior right upper lobe and adjacent right lower lobe. This could reflect early bronchopneumonia. No acute  findings in the abdomen or pelvis. Electronically Signed   By: Charlett Nose M.D.   On: 11/01/2015 12:58   Ct Abdomen Pelvis W Contrast  11/01/2015  CLINICAL DATA:  Shortness of breath, abdominal pain. Evaluate for pulmonary embolus or dissection. EXAM: CT ANGIOGRAPHY CHEST, ABDOMEN AND PELVIS TECHNIQUE: Multidetector CT imaging through the chest, abdomen and pelvis was performed using the standard protocol during bolus administration of intravenous contrast. Multiplanar reconstructed images and MIPs were obtained and reviewed to evaluate the vascular anatomy. CONTRAST:  OMNIPAQUE IOHEXOL 350 MG/ML SOLN COMPARISON:  None. FINDINGS: CTA CHEST FINDINGS Mediastinum/Nodes: There is cardiomegaly. Small pericardial effusion. Aorta  is normal caliber. No dissection. No filling defects in the pulmonary arteries to suggest pulmonary emboli. No mediastinal, hilar, or axillary adenopathy. Lungs/Pleura: Mild hazy opacities within the lungs could reflect early edema. Tree-in-bud densities are noted in the inferior right upper lobe and within the right lower lobe concerning for early bronchopneumonia. No confluent opacity on the left. No effusions. Musculoskeletal: Chest wall soft tissues are unremarkable. No acute bony abnormality. Review of the MIP images confirms the above findings. CTA ABDOMEN AND PELVIS FINDINGS Hepatobiliary: Unremarkable appearance.  Gallbladder unremarkable. Pancreas: Unremarkable appearance Spleen: Unremarkable appearance Adrenals/Urinary Tract: No adrenal abnormality. No focal renal abnormality. No stones or hydronephrosis. Urinary bladder is unremarkable. Stomach/Bowel: Normal appendix. Stomach, large and small bowel grossly unremarkable. Vascular/Lymphatic: No evidence of aneurysm or adenopathy. No evidence of dissection. Mesenteric vessels and single renal arteries are widely patent bilaterally. Reproductive: No abnormality. Other: No free fluid or free air. Musculoskeletal: No acute bony  abnormality. Review of the MIP images confirms the above findings. IMPRESSION: No evidence of pulmonary embolus. No evidence of aortic aneurysm or dissection. Cardiomegaly, small pericardial effusion. Mild ground-glass opacities within the lungs could reflect early edema. Or focal tree-in-bud densities within the inferior right upper lobe and adjacent right lower lobe. This could reflect early bronchopneumonia. No acute findings in the abdomen or pelvis. Electronically Signed   By: Charlett Nose M.D.   On: 11/01/2015 12:58   Dg Hand Complete Left  11/02/2015  CLINICAL DATA:  Woke up with chest tightness and pain/swelling in both hands, initial encounter EXAM: LEFT HAND - COMPLETE 3+ VIEW COMPARISON:  None FINDINGS: Diffuse soft tissue swelling. Osseous mineralization normal. Joint spaces preserved. No acute fracture, dislocation, or definite bone destruction. IMPRESSION: Significant soft tissue swelling without acute osseous abnormalities. Electronically Signed   By: Ulyses Southward M.D.   On: 11/02/2015 14:05     CBC  Recent Labs Lab 11/01/15 1043 11/02/15 0339  WBC 8.8 7.0  HGB 13.3 12.0*  HCT 39.3 36.6*  PLT 304 264  MCV 66.7* 67.3*  MCH 22.6* 22.1*  MCHC 33.8 32.8  RDW 15.6* 15.7*  LYMPHSABS 1.9  --   MONOABS 0.6  --   EOSABS 0.1  --   BASOSABS 0.1  --     Chemistries   Recent Labs Lab 11/01/15 1043 11/02/15 0339 11/03/15 0600 11/04/15 0638  NA 140 141 139 138  K 4.9 4.2 4.1 3.9  CL 102 104 101 101  CO2 26 24 24 26   GLUCOSE 122* 108* 100* 105*  BUN 13 10 8 7   CREATININE 1.26* 1.08 1.07 1.08  CALCIUM 9.2 8.9 9.1 9.2  MG  --   --  1.7 1.9  AST 23  --   --   --   ALT 20  --   --   --   ALKPHOS 69  --   --   --   BILITOT 1.8*  --   --   --    ------------------------------------------------------------------------------------------------------------------ No results for input(s): CHOL, HDL, LDLCALC, TRIG, CHOLHDL, LDLDIRECT in the last 72 hours.  No results found for:  HGBA1C ------------------------------------------------------------------------------------------------------------------ No results for input(s): TSH, T4TOTAL, T3FREE, THYROIDAB in the last 72 hours.  Invalid input(s): FREET3 ------------------------------------------------------------------------------------------------------------------ No results for input(s): VITAMINB12, FOLATE, FERRITIN, TIBC, IRON, RETICCTPCT in the last 72 hours.  Coagulation profile No results for input(s): INR, PROTIME in the last 168 hours.   Recent Labs  11/01/15 1104  DDIMER 2.03*    Cardiac Enzymes  Recent Labs Lab  11/01/15 1547 11/01/15 2047 11/02/15 0339  TROPONINI 0.09* 0.07* 0.07*   ------------------------------------------------------------------------------------------------------------------    Component Value Date/Time   BNP 300.7* 11/01/2015 1245    Time Spent in minutes  35   Percival Glasheen K M.D on 11/04/2015 at 8:53 AM  Between 7am to 7pm - Pager - [phone]  After 7pm go to [email or web address] - password [facility]  [facility] -  Office  [phone]

## 2015-11-04 NOTE — Progress Notes (Signed)
Patient Name: Paul Lamb Date of Encounter: 11/04/2015     Active Problems:   Cough   Hypertensive emergency   Hand pain, left   Morbid obesity (HCC)   Bilateral lower extremity edema   Elevated serum creatinine   Chest tightness   Abnormal EKG   DOE (dyspnea on exertion)   Malignant hypertension   Peripheral edema   Pulmonary edema cardiac cause (HCC)    SUBJECTIVE  Denies any CP or SOB. Leg swelling improving.   CURRENT MEDS . amLODipine  10 mg Oral Daily  . enoxaparin (LOVENOX) injection  90 mg Subcutaneous Q24H  . famotidine  20 mg Oral QHS  . furosemide  80 mg Intravenous BID  . hydrALAZINE  100 mg Oral 3 times per day  . isosorbide mononitrate  60 mg Oral Daily  . labetalol  200 mg Oral BID  . levofloxacin  750 mg Oral Q24H  . lisinopril  20 mg Oral Daily    OBJECTIVE  Filed Vitals:   11/04/15 0900 11/04/15 1020 11/04/15 1025 11/04/15 1229  BP: 141/56 152/77 136/78 129/68  Pulse: 96 97 95 91  Temp:    98.6 F (37 C)  TempSrc:      Resp: 20   20  Height:      Weight:      SpO2: 95%   97%    Intake/Output Summary (Last 24 hours) at 11/04/15 1242 Last data filed at 11/04/15 1241  Gross per 24 hour  Intake    925 ml  Output   5550 ml  Net  -4625 ml   Filed Weights   11/02/15 0100 11/03/15 0500 11/04/15 0500  Weight: 427 lb 4 oz (193.8 kg) 426 lb (193.232 kg) 398 lb 11.2 oz (180.849 kg)    PHYSICAL EXAM  General: Pleasant, NAD. Neuro: Alert and oriented X 3. Moves all extremities spontaneously. Psych: Normal affect. HEENT:  Normal  Neck: Supple without bruits or JVD. Lungs:  Resp regular and unlabored, CTA. Heart: RRR no s3, s4, or murmurs. Abdomen: Soft, non-tender, non-distended, BS + x 4.  Extremities: No clubbing, cyanosis. DP/PT/Radials 2+ and equal bilaterally. LE in elastic dressing, 1-2 pitting edema  Accessory Clinical Findings  CBC  Recent Labs  11/02/15 0339  WBC 7.0  HGB 12.0*  HCT 36.6*  MCV 67.3*  PLT 264    Basic Metabolic Panel  Recent Labs  11/03/15 0600 11/04/15 0638  NA 139 138  K 4.1 3.9  CL 101 101  CO2 24 26  GLUCOSE 100* 105*  BUN 8 7  CREATININE 1.07 1.08  CALCIUM 9.1 9.2  MG 1.7 1.9   Cardiac Enzymes  Recent Labs  11/01/15 1547 11/01/15 2047 11/02/15 0339  TROPONINI 0.09* 0.07* 0.07*    TELE Not on tele    ECG  No new EKG  Echocardiogram  pending    Radiology/Studies  Dg Chest 2 View  11/01/2015  CLINICAL DATA:  30 year old male with history of central non radiating chest pain and shortness breath for the past 3 days. Cough. EXAM: CHEST  2 VIEW COMPARISON:  Chest x-ray 05/23/2004. FINDINGS: Diffuse peribronchial cuffing. Patchy airspace disease predominantly throughout the right mid to lower lung. Left lung appears relatively clear. No pneumothorax. No suspicious appearing pulmonary nodules or masses. No evidence of pulmonary edema. Heart size is moderately enlarged (new compared to the prior study). Mild elevation of the right hemidiaphragm. Upper mediastinal contours are within normal limits. IMPRESSION: 1. Peribronchial cuffing with patchy airspace  disease throughout the right mid to lower lung, concerning for bronchitis with multilobar bronchopneumonia, most evident in the right middle and lower lobes. 2. In addition, however, there is also now moderate cardiomegaly which is new compared to the prior study. In this age group, clinical correlation for nonischemic cardiomyopathy is recommended, such as viral myocarditis. Electronically Signed   By: Trudie Reed M.D.   On: 11/01/2015 11:32   Ct Angio Chest Pe W/cm &/or Wo Cm  11/01/2015  CLINICAL DATA:  Shortness of breath, abdominal pain. Evaluate for pulmonary embolus or dissection. EXAM: CT ANGIOGRAPHY CHEST, ABDOMEN AND PELVIS TECHNIQUE: Multidetector CT imaging through the chest, abdomen and pelvis was performed using the standard protocol during bolus administration of intravenous contrast.  Multiplanar reconstructed images and MIPs were obtained and reviewed to evaluate the vascular anatomy. CONTRAST:  OMNIPAQUE IOHEXOL 350 MG/ML SOLN COMPARISON:  None. FINDINGS: CTA CHEST FINDINGS Mediastinum/Nodes: There is cardiomegaly. Small pericardial effusion. Aorta is normal caliber. No dissection. No filling defects in the pulmonary arteries to suggest pulmonary emboli. No mediastinal, hilar, or axillary adenopathy. Lungs/Pleura: Mild hazy opacities within the lungs could reflect early edema. Tree-in-bud densities are noted in the inferior right upper lobe and within the right lower lobe concerning for early bronchopneumonia. No confluent opacity on the left. No effusions. Musculoskeletal: Chest wall soft tissues are unremarkable. No acute bony abnormality. Review of the MIP images confirms the above findings. CTA ABDOMEN AND PELVIS FINDINGS Hepatobiliary: Unremarkable appearance.  Gallbladder unremarkable. Pancreas: Unremarkable appearance Spleen: Unremarkable appearance Adrenals/Urinary Tract: No adrenal abnormality. No focal renal abnormality. No stones or hydronephrosis. Urinary bladder is unremarkable. Stomach/Bowel: Normal appendix. Stomach, large and small bowel grossly unremarkable. Vascular/Lymphatic: No evidence of aneurysm or adenopathy. No evidence of dissection. Mesenteric vessels and single renal arteries are widely patent bilaterally. Reproductive: No abnormality. Other: No free fluid or free air. Musculoskeletal: No acute bony abnormality. Review of the MIP images confirms the above findings. IMPRESSION: No evidence of pulmonary embolus. No evidence of aortic aneurysm or dissection. Cardiomegaly, small pericardial effusion. Mild ground-glass opacities within the lungs could reflect early edema. Or focal tree-in-bud densities within the inferior right upper lobe and adjacent right lower lobe. This could reflect early bronchopneumonia. No acute findings in the abdomen or pelvis.  Electronically Signed   By: Charlett Nose M.D.   On: 11/01/2015 12:58   Ct Abdomen Pelvis W Contrast  11/01/2015  CLINICAL DATA:  Shortness of breath, abdominal pain. Evaluate for pulmonary embolus or dissection. EXAM: CT ANGIOGRAPHY CHEST, ABDOMEN AND PELVIS TECHNIQUE: Multidetector CT imaging through the chest, abdomen and pelvis was performed using the standard protocol during bolus administration of intravenous contrast. Multiplanar reconstructed images and MIPs were obtained and reviewed to evaluate the vascular anatomy. CONTRAST:  OMNIPAQUE IOHEXOL 350 MG/ML SOLN COMPARISON:  None. FINDINGS: CTA CHEST FINDINGS Mediastinum/Nodes: There is cardiomegaly. Small pericardial effusion. Aorta is normal caliber. No dissection. No filling defects in the pulmonary arteries to suggest pulmonary emboli. No mediastinal, hilar, or axillary adenopathy. Lungs/Pleura: Mild hazy opacities within the lungs could reflect early edema. Tree-in-bud densities are noted in the inferior right upper lobe and within the right lower lobe concerning for early bronchopneumonia. No confluent opacity on the left. No effusions. Musculoskeletal: Chest wall soft tissues are unremarkable. No acute bony abnormality. Review of the MIP images confirms the above findings. CTA ABDOMEN AND PELVIS FINDINGS Hepatobiliary: Unremarkable appearance.  Gallbladder unremarkable. Pancreas: Unremarkable appearance Spleen: Unremarkable appearance Adrenals/Urinary Tract: No adrenal abnormality. No focal renal  abnormality. No stones or hydronephrosis. Urinary bladder is unremarkable. Stomach/Bowel: Normal appendix. Stomach, large and small bowel grossly unremarkable. Vascular/Lymphatic: No evidence of aneurysm or adenopathy. No evidence of dissection. Mesenteric vessels and single renal arteries are widely patent bilaterally. Reproductive: No abnormality. Other: No free fluid or free air. Musculoskeletal: No acute bony abnormality. Review of the MIP images  confirms the above findings. IMPRESSION: No evidence of pulmonary embolus. No evidence of aortic aneurysm or dissection. Cardiomegaly, small pericardial effusion. Mild ground-glass opacities within the lungs could reflect early edema. Or focal tree-in-bud densities within the inferior right upper lobe and adjacent right lower lobe. This could reflect early bronchopneumonia. No acute findings in the abdomen or pelvis. Electronically Signed   By: Charlett Nose M.D.   On: 11/01/2015 12:58   Dg Hand Complete Left  11/02/2015  CLINICAL DATA:  Woke up with chest tightness and pain/swelling in both hands, initial encounter EXAM: LEFT HAND - COMPLETE 3+ VIEW COMPARISON:  None FINDINGS: Diffuse soft tissue swelling. Osseous mineralization normal. Joint spaces preserved. No acute fracture, dislocation, or definite bone destruction. IMPRESSION: Significant soft tissue swelling without acute osseous abnormalities. Electronically Signed   By: Ulyses Southward M.D.   On: 11/02/2015 14:05    ASSESSMENT AND PLAN  1. Acute on chronic diastolic HF in the setting of malignant HTN - pending echocardiogram. Increase IV lasix to 80mg  BID. Lung clear on exam, however still has 1-2+ pitting edema in the legs. - discussed with the patient regarding fluid restriction and salt restriction. Will need to establish with a PCP - still fluid overloaded, weight likely not accurate, will need at least 1-2 more day of diuresis.  2. Malignant HTN: related to missed BP med - likely related to obesity and OSA - continue amlodipine, hydralazine, Imdur, lisinopril, increase labetalol to 200mg  BID  3. OSA: on CPAP  4. Morbid obesity  5. Proteinuria: related to HTN   Signed, Amedeo Plenty Pager: [phone]  Agree with note by Azalee Course PA-C  Good diuresis. Clinically improved. Still has 1-2+ pitting edema. On CPAP. 2D pending. Would cont IV lasix for 1-2 more days then switch  to PO (40 mg q day). Prob home next 2-3 days.   Runell Gess, M.D., FACP, [facility], [facility] [address] [place], [place]  [address]  [phone] 11/04/2015 12:59 PM

## 2015-11-04 NOTE — Progress Notes (Signed)
Initial Nutrition Assessment  DOCUMENTATION CODES:   Morbid obesity  INTERVENTION:  Provide 30 ml Prostat po BID, each supplement provides 100 kcal and 15 grams of protein.   Encourage adequate PO intake.  NUTRITION DIAGNOSIS:   Inadequate oral intake related to poor appetite as evidenced by per patient/family report.  GOAL:   Patient will meet greater than or equal to 90% of their needs  MONITOR:   PO intake, Supplement acceptance, Weight trends, Labs, I & O's  REASON FOR ASSESSMENT:   Consult Assessment of nutrition requirement/status  ASSESSMENT:   30 yo with morbid obesity, poor compliant, no meds or medical care due to lack of insurance, likely having sleep apnea, presented to the ER with HTN pulmonary edema.Presents with acute on chronic CHF.  Meal completion has been 50-65%. Pt reports having a decreased appetite which just started yesterday. Pt reports eating fine PTA with at least 2 meals a day. Noted weight is trending down as pt is currently diuresing. RD to order Prostat to aid in caloric and protein needs. Pt was encouraged to eat his food at meals. Pt and wife familiar with need for fluid restriction.   Pt with no observed significant fat or muscle mass loss.  Labs and medication reviewed.   Diet Order:  Diet Heart Room service appropriate?: Yes; Fluid consistency:: Thin; Fluid restriction:: 1200 mL Fluid  Skin:  Reviewed, no issues  Last BM:  3/12  Height:   Ht Readings from Last 1 Encounters:  11/02/15 6\' 2"  (1.88 m)    Weight:   Wt Readings from Last 1 Encounters:  11/04/15 398 lb 11.2 oz (180.849 kg)    Ideal Body Weight:  86.36 kg  BMI:  Body mass index is 51.17 kg/(m^2).  Estimated Nutritional Needs:   Kcal:  2300-2500  Protein:  130-150 grams  Fluid:  1.2 L/day  EDUCATION NEEDS:   No education needs identified at this time  Roslyn Smiling, MS, RD, LDN Pager # [phone] After hours/ weekend pager # [phone]

## 2015-11-04 NOTE — Progress Notes (Signed)
Placed patient on CPAP for the night with oxygen set at 2lpm. 

## 2015-11-05 ENCOUNTER — Other Ambulatory Visit: Payer: Self-pay | Admitting: Physician Assistant

## 2015-11-05 DIAGNOSIS — I5031 Acute diastolic (congestive) heart failure: Secondary | ICD-10-CM

## 2015-11-05 DIAGNOSIS — I5033 Acute on chronic diastolic (congestive) heart failure: Secondary | ICD-10-CM

## 2015-11-05 DIAGNOSIS — I161 Hypertensive emergency: Secondary | ICD-10-CM

## 2015-11-05 LAB — BASIC METABOLIC PANEL
ANION GAP: 11 (ref 5–15)
BUN: 11 mg/dL (ref 6–20)
CO2: 26 mmol/L (ref 22–32)
Calcium: 9.1 mg/dL (ref 8.9–10.3)
Chloride: 101 mmol/L (ref 101–111)
Creatinine, Ser: 1.31 mg/dL — ABNORMAL HIGH (ref 0.61–1.24)
GFR calc Af Amer: >60 mL/min (ref >60)
GFR calc non Af Amer: >60 mL/min (ref >60)
GLUCOSE: 104 mg/dL — AB (ref 65–99)
POTASSIUM: 3.9 mmol/L (ref 3.5–5.1)
Sodium: 138 mmol/L (ref 135–145)

## 2015-11-05 LAB — CBC
HCT: 37.8 % — ABNORMAL LOW (ref 39.0–52.0)
HEMOGLOBIN: 12.9 g/dL — AB (ref 13.0–17.0)
MCH: 22.9 pg — AB (ref 26.0–34.0)
MCHC: 34.1 g/dL (ref 30.0–36.0)
MCV: 67.1 fL — AB (ref 78.0–100.0)
Platelets: 277 10*3/uL (ref 150–400)
RBC: 5.63 MIL/uL (ref 4.22–5.81)
RDW: 16.1 % — ABNORMAL HIGH (ref 11.5–15.5)
WBC: 8.7 10*3/uL (ref 4.0–10.5)

## 2015-11-05 LAB — MAGNESIUM: Magnesium: 1.8 mg/dL (ref 1.7–2.4)

## 2015-11-05 MED ORDER — FUROSEMIDE 40 MG PO TABS
40.0000 mg | ORAL_TABLET | Freq: Two times a day (BID) | ORAL | Status: DC
  Administered 2015-11-06 – 2015-11-07 (×3): 40 mg via ORAL
  Filled 2015-11-05 (×3): qty 1

## 2015-11-05 MED ORDER — SENNOSIDES-DOCUSATE SODIUM 8.6-50 MG PO TABS: 2.0000 | ORAL_TABLET | Freq: Two times a day (BID) | ORAL | Status: DC

## 2015-11-05 MED ORDER — SENNOSIDES-DOCUSATE SODIUM 8.6-50 MG PO TABS
1.0000 | ORAL_TABLET | Freq: Two times a day (BID) | ORAL | Status: AC
  Administered 2015-11-05 – 2015-11-06 (×4): 1 via ORAL
  Filled 2015-11-05 (×4): qty 1

## 2015-11-05 NOTE — Progress Notes (Signed)
Subjective:  No CP/SOB. Good diuresis  Objective:  Temp:  [97.6 F (36.4 C)-100 F (37.8 C)] 98 F (36.7 C) (03/16 1100) Pulse Rate:  [93-105] 96 (03/16 1100) Resp:  [20] 20 (03/16 1100) BP: (103-142)/(44-82) 103/44 mmHg (03/16 1100) SpO2:  [93 %-95 %] 93 % (03/16 1100) Weight:  [379 lb 9.6 oz (172.185 kg)] 379 lb 9.6 oz (172.185 kg) (03/16 0500) Weight change: -19 lb 1.6 oz (-8.664 kg)  Intake/Output from previous day: 03/15 0701 - 03/16 0700 In: 600 [P.O.:600] Out: 4050 [Urine:4050]  Intake/Output from this shift: Total I/O In: 480 [P.O.:480] Out: 400 [Urine:400]  Physical Exam: General appearance: alert and no distress Neck: no adenopathy, no carotid bruit, no JVD, supple, symmetrical, trachea midline and thyroid not enlarged, symmetric, no tenderness/mass/nodules Lungs: clear to auscultation bilaterally Heart: regular rate and rhythm, S1, S2 normal, no murmur, click, rub or gallop Extremities: 1-2+ pitting edema  Lab Results: Results for orders placed or performed during the hospital encounter of 11/01/15 (from the past 48 hour(s))  Basic metabolic panel     Status: Abnormal   Collection Time: 11/04/15  6:38 AM  Result Value Ref Range   Sodium 138 135 - 145 mmol/L   Potassium 3.9 3.5 - 5.1 mmol/L   Chloride 101 101 - 111 mmol/L   CO2 26 22 - 32 mmol/L   Glucose, Bld 105 (H) 65 - 99 mg/dL   BUN 7 6 - 20 mg/dL   Creatinine, Ser 1.08 0.61 - 1.24 mg/dL   Calcium 9.2 8.9 - 10.3 mg/dL   GFR calc non Af Amer >60 >60 mL/min   GFR calc Af Amer >60 >60 mL/min    Comment: (NOTE) The eGFR has been calculated using the CKD EPI equation. This calculation has not been validated in all clinical situations. eGFR's persistently <60 mL/min signify possible Chronic Kidney Disease.    Anion gap 11 5 - 15  Magnesium     Status: None   Collection Time: 11/04/15  6:38 AM  Result Value Ref Range   Magnesium 1.9 1.7 - 2.4 mg/dL  Basic metabolic panel     Status: Abnormal     Collection Time: 11/05/15  5:37 AM  Result Value Ref Range   Sodium 138 135 - 145 mmol/L   Potassium 3.9 3.5 - 5.1 mmol/L   Chloride 101 101 - 111 mmol/L   CO2 26 22 - 32 mmol/L   Glucose, Bld 104 (H) 65 - 99 mg/dL   BUN 11 6 - 20 mg/dL   Creatinine, Ser 1.31 (H) 0.61 - 1.24 mg/dL   Calcium 9.1 8.9 - 10.3 mg/dL   GFR calc non Af Amer >60 >60 mL/min   GFR calc Af Amer >60 >60 mL/min    Comment: (NOTE) The eGFR has been calculated using the CKD EPI equation. This calculation has not been validated in all clinical situations. eGFR's persistently <60 mL/min signify possible Chronic Kidney Disease.    Anion gap 11 5 - 15  Magnesium     Status: None   Collection Time: 11/05/15  5:37 AM  Result Value Ref Range   Magnesium 1.8 1.7 - 2.4 mg/dL  CBC     Status: Abnormal   Collection Time: 11/05/15  5:37 AM  Result Value Ref Range   WBC 8.7 4.0 - 10.5 K/uL   RBC 5.63 4.22 - 5.81 MIL/uL   Hemoglobin 12.9 (L) 13.0 - 17.0 g/dL   HCT 37.8 (L) 39.0 - 52.0 %  MCV 67.1 (L) 78.0 - 100.0 fL   MCH 22.9 (L) 26.0 - 34.0 pg   MCHC 34.1 30.0 - 36.0 g/dL   RDW 16.1 (H) 11.5 - 15.5 %   Platelets 277 150 - 400 K/uL    Imaging: Imaging results have been reviewed  Assessment/Plan:   1. Active Problems: 2.   Cough 3.   Hypertensive emergency 4.   Hand pain, left 5.   Morbid obesity ([place]) 6.   Bilateral lower extremity edema 7.   Elevated serum creatinine 8.   Chest tightness 9.   Abnormal EKG 10.   DOE (dyspnea on exertion) 11.   Malignant hypertension 12.   Peripheral edema 13.   Pulmonary edema cardiac cause ([place]) 14.   AKI (acute kidney injury) ([place]) 15.   Cardiomegaly - hypertensive 16.   Elevated d-dimer 17.   SOB (shortness of breath) 18.   Time Spent Directly with Patient:  15 minutes  Length of Stay:  LOS: 3 days   Doing much better. Excellent diuresis. I/O neg 6.5 liters. Labs OK. Exam benign. BP under better control. Can transition to PO lasix (40 mg PO BID). OK to  DC from our point of view next 24-48 hours with close OP follow up. Will need CPAP machine and prescription as well.   Quay Burow 11/05/2015, 1:09 PM

## 2015-11-05 NOTE — Progress Notes (Signed)
Patient Demographics:    Paul Lamb, is a 30 y.o. male, DOB - 1985-12-06, ZOX:[id number]  Admit date - 11/01/2015   Admitting Physician Houston Siren, MD  Outpatient Primary MD for the patient is No PCP Per Patient  LOS - 3   Chief Complaint  Patient presents with  . Abdominal Pain  . Hand Pain        Subjective:    [name] today has, No headache, No chest pain, No abdominal pain - No Nausea, No new weakness tingling or numbness, No Cough - Improved shortness of breath. Lower extremity swelling has gone down considerably. Ambulating without assistance, wife in room   Assessment  & Plan :     1. Hypertensive emergency with chest tightness and pulmonary edema. Blood pressure much improved, blood pressure regimen titrated with good effect. He is currently on beta blocker, Norvasc, Imdur along with ACE inhibitor, placed on IV Lasix, SOB symptoms resolved,  Echogram with preserved LVEF  2. Mildly elevated troponin. In none serious pattern due to #1 above, check echogram to evaluate wall motion and EF. Currently on beta blocker and aspirin added. Chest pain-free. Cards following.  3. Morbid obesity with possible undiagnosed obstructive sleep apnea. Had episodes of bradycardia and apnea at night, placed on CPAP, home CPAP ordered per case management, outpatient sleep study also scheduled by case management. Follow with PCP for weight reduction.  4. Elevated d-dimer upon admission. CT angiogram chest unremarkable, Lower extremity venous duplex negative.  5. Acute on chronic diastolic CHF. Likely diastolic with right-sided heart failure as well. Good diuresis, sob improving, weight down, lower edema improving,  continue beta blocker, Imdur, echogram with preserved lVEF, PA pressure ,  on Unna boot,  iv lasix , titration per cardiology, monitor cr/lytes. Appreciate cardiology input.  Filed Weights   11/03/15 0500 11/04/15 0500 11/05/15 0500  Weight: 193.232 kg (426 lb) 180.849 kg (398 lb 11.2 oz) 172.185 kg (379 lb 9.6 oz)    6. Intermittent left upper quadrant abdominal pain. Resolved. Placed on PPI.  7. Mild L Hand swelling after trauma - he had trauma with his car door handle, x-ray stable, soft tissue swelling has improved. No signs of fluctuance or infection. Swelling almost completely resolved.  8. Bronchopneumonia? No fever, no leukocytosis, was on rocephin/zithro on day one then levaquin, now on abx 5 days, mild rising cr, will d/c abx.  9. Constipation, no bm x4 days, start stool softener.     Code Status : Full  Family Communication  : wife and son in the room  Disposition Plan  : Likely discharge home in a few days  Consults  :  cardiology  Procedures  :   CTA Lungs - No PE, CHF versus early pneumonia.  CT abdomen pelvis. No acute findings.  Echogram - preserved LVEF  Venous duplex - no DVT   DVT Prophylaxis  :  Lovenox    Lab Results  Component Value Date   PLT 277 11/05/2015    Inpatient Medications  Scheduled Meds: . amLODipine  10 mg Oral Daily  . enoxaparin (LOVENOX) injection  90 mg Subcutaneous Q24H  . famotidine  20 mg Oral QHS  . feeding supplement (PRO-STAT SUGAR FREE 64)  30 mL Oral  BID  . furosemide  80 mg Intravenous BID  . hydrALAZINE  100 mg Oral 3 times per day  . isosorbide mononitrate  60 mg Oral Daily  . labetalol  200 mg Oral BID  . levofloxacin  750 mg Oral Q24H  . lisinopril  20 mg Oral Daily   Continuous Infusions:  PRN Meds:.acetaminophen **OR** [DISCONTINUED] acetaminophen, guaiFENesin, hydrALAZINE, HYDROcodone-acetaminophen, [DISCONTINUED] ondansetron **OR** ondansetron (ZOFRAN) IV  Antibiotics  :    Anti-infectives    Start     Dose/Rate Route Frequency Ordered Stop   11/02/15 1000  levofloxacin (LEVAQUIN)  tablet 750 mg     750 mg Oral Every 24 hours 11/01/15 1601 11/07/15 0959   11/02/15 0000  levofloxacin (LEVAQUIN) tablet 750 mg  Status:  Discontinued     750 mg Oral Every 24 hours 11/01/15 1554 11/01/15 1601   11/01/15 1145  cefTRIAXone (ROCEPHIN) 1 g in dextrose 5 % 50 mL IVPB     1 g 100 mL/hr over 30 Minutes Intravenous  Once 11/01/15 1137 11/01/15 1335   11/01/15 1145  azithromycin (ZITHROMAX) 500 mg in dextrose 5 % 250 mL IVPB     500 mg 250 mL/hr over 60 Minutes Intravenous  Once 11/01/15 1137 11/01/15 1549        Objective:   Filed Vitals:   11/04/15 1439 11/04/15 1849 11/04/15 2100 11/05/15 0500  BP: 121/54 142/82 133/66 136/77  Pulse: 93  105 93  Temp:   100 F (37.8 C) 97.6 F (36.4 C)  TempSrc:   Oral Oral  Resp:      Height:      Weight:    172.185 kg (379 lb 9.6 oz)  SpO2:   95% 93%    Wt Readings from Last 3 Encounters:  11/05/15 172.185 kg (379 lb 9.6 oz)  06/22/15 162.388 kg (358 lb)  09/29/11 157.942 kg (348 lb 3.2 oz)     Intake/Output Summary (Last 24 hours) at 11/05/15 1219 Last data filed at 11/05/15 0900  Gross per 24 hour  Intake    600 ml  Output   1650 ml  Net  -1050 ml     Physical Exam  Awake Alert, Oriented X 3, No new F.N deficits, Normal affect, obese [place].AT,PERRAL Supple Neck,No JVD, No cervical lymphadenopathy appriciated.  Symmetrical Chest wall movement, Good air movement bilaterally, minimal RALES RRR,No Gallops,Rubs or new Murmurs, No Parasternal Heave +ve B.Sounds, Abd Soft, No tenderness, No organomegaly appriciated, No rebound - guarding or rigidity. No Cyanosis, Clubbing , 1+ edema with UNNA boots, No new Rash or bruise       Data Review:   Micro Results Recent Results (from the past 240 hour(s))  Culture, blood (routine x 2) Call MD if unable to obtain prior to antibiotics being given     Status: None (Preliminary result)   Collection Time: 11/01/15  6:12 PM  Result Value Ref Range Status   Specimen Description  BLOOD RIGHT ANTECUBITAL  Final   Special Requests BOTTLES DRAWN AEROBIC AND ANAEROBIC 5CC  Final   Culture NO GROWTH 4 DAYS  Final   Report Status PENDING  Incomplete  Culture, blood (routine x 2) Call MD if unable to obtain prior to antibiotics being given     Status: None (Preliminary result)   Collection Time: 11/01/15  6:24 PM  Result Value Ref Range Status   Specimen Description BLOOD RIGHT ANTECUBITAL  Final   Special Requests BOTTLES DRAWN AEROBIC AND ANAEROBIC 5CC  Final  Culture NO GROWTH 4 DAYS  Final   Report Status PENDING  Incomplete    Radiology Reports Dg Chest 2 View  11/01/2015  CLINICAL DATA:  30 year old male with history of central non radiating chest pain and shortness breath for the past 3 days. Cough. EXAM: CHEST  2 VIEW COMPARISON:  Chest x-ray 05/23/2004. FINDINGS: Diffuse peribronchial cuffing. Patchy airspace disease predominantly throughout the right mid to lower lung. Left lung appears relatively clear. No pneumothorax. No suspicious appearing pulmonary nodules or masses. No evidence of pulmonary edema. Heart size is moderately enlarged (new compared to the prior study). Mild elevation of the right hemidiaphragm. Upper mediastinal contours are within normal limits. IMPRESSION: 1. Peribronchial cuffing with patchy airspace disease throughout the right mid to lower lung, concerning for bronchitis with multilobar bronchopneumonia, most evident in the right middle and lower lobes. 2. In addition, however, there is also now moderate cardiomegaly which is new compared to the prior study. In this age group, clinical correlation for nonischemic cardiomyopathy is recommended, such as viral myocarditis. Electronically Signed   By: Trudie Reed M.D.   On: 11/01/2015 11:32   Ct Angio Chest Pe W/cm &/or Wo Cm  11/01/2015  CLINICAL DATA:  Shortness of breath, abdominal pain. Evaluate for pulmonary embolus or dissection. EXAM: CT ANGIOGRAPHY CHEST, ABDOMEN AND PELVIS TECHNIQUE:  Multidetector CT imaging through the chest, abdomen and pelvis was performed using the standard protocol during bolus administration of intravenous contrast. Multiplanar reconstructed images and MIPs were obtained and reviewed to evaluate the vascular anatomy. CONTRAST:  OMNIPAQUE IOHEXOL 350 MG/ML SOLN COMPARISON:  None. FINDINGS: CTA CHEST FINDINGS Mediastinum/Nodes: There is cardiomegaly. Small pericardial effusion. Aorta is normal caliber. No dissection. No filling defects in the pulmonary arteries to suggest pulmonary emboli. No mediastinal, hilar, or axillary adenopathy. Lungs/Pleura: Mild hazy opacities within the lungs could reflect early edema. Tree-in-bud densities are noted in the inferior right upper lobe and within the right lower lobe concerning for early bronchopneumonia. No confluent opacity on the left. No effusions. Musculoskeletal: Chest wall soft tissues are unremarkable. No acute bony abnormality. Review of the MIP images confirms the above findings. CTA ABDOMEN AND PELVIS FINDINGS Hepatobiliary: Unremarkable appearance.  Gallbladder unremarkable. Pancreas: Unremarkable appearance Spleen: Unremarkable appearance Adrenals/Urinary Tract: No adrenal abnormality. No focal renal abnormality. No stones or hydronephrosis. Urinary bladder is unremarkable. Stomach/Bowel: Normal appendix. Stomach, large and small bowel grossly unremarkable. Vascular/Lymphatic: No evidence of aneurysm or adenopathy. No evidence of dissection. Mesenteric vessels and single renal arteries are widely patent bilaterally. Reproductive: No abnormality. Other: No free fluid or free air. Musculoskeletal: No acute bony abnormality. Review of the MIP images confirms the above findings. IMPRESSION: No evidence of pulmonary embolus. No evidence of aortic aneurysm or dissection. Cardiomegaly, small pericardial effusion. Mild ground-glass opacities within the lungs could reflect early edema. Or focal tree-in-bud densities within  the inferior right upper lobe and adjacent right lower lobe. This could reflect early bronchopneumonia. No acute findings in the abdomen or pelvis. Electronically Signed   By: Charlett Nose M.D.   On: 11/01/2015 12:58   Ct Abdomen Pelvis W Contrast  11/01/2015  CLINICAL DATA:  Shortness of breath, abdominal pain. Evaluate for pulmonary embolus or dissection. EXAM: CT ANGIOGRAPHY CHEST, ABDOMEN AND PELVIS TECHNIQUE: Multidetector CT imaging through the chest, abdomen and pelvis was performed using the standard protocol during bolus administration of intravenous contrast. Multiplanar reconstructed images and MIPs were obtained and reviewed to evaluate the vascular anatomy. CONTRAST:  OMNIPAQUE IOHEXOL 350  MG/ML SOLN COMPARISON:  None. FINDINGS: CTA CHEST FINDINGS Mediastinum/Nodes: There is cardiomegaly. Small pericardial effusion. Aorta is normal caliber. No dissection. No filling defects in the pulmonary arteries to suggest pulmonary emboli. No mediastinal, hilar, or axillary adenopathy. Lungs/Pleura: Mild hazy opacities within the lungs could reflect early edema. Tree-in-bud densities are noted in the inferior right upper lobe and within the right lower lobe concerning for early bronchopneumonia. No confluent opacity on the left. No effusions. Musculoskeletal: Chest wall soft tissues are unremarkable. No acute bony abnormality. Review of the MIP images confirms the above findings. CTA ABDOMEN AND PELVIS FINDINGS Hepatobiliary: Unremarkable appearance.  Gallbladder unremarkable. Pancreas: Unremarkable appearance Spleen: Unremarkable appearance Adrenals/Urinary Tract: No adrenal abnormality. No focal renal abnormality. No stones or hydronephrosis. Urinary bladder is unremarkable. Stomach/Bowel: Normal appendix. Stomach, large and small bowel grossly unremarkable. Vascular/Lymphatic: No evidence of aneurysm or adenopathy. No evidence of dissection. Mesenteric vessels and single renal arteries are widely patent  bilaterally. Reproductive: No abnormality. Other: No free fluid or free air. Musculoskeletal: No acute bony abnormality. Review of the MIP images confirms the above findings. IMPRESSION: No evidence of pulmonary embolus. No evidence of aortic aneurysm or dissection. Cardiomegaly, small pericardial effusion. Mild ground-glass opacities within the lungs could reflect early edema. Or focal tree-in-bud densities within the inferior right upper lobe and adjacent right lower lobe. This could reflect early bronchopneumonia. No acute findings in the abdomen or pelvis. Electronically Signed   By: Charlett Nose M.D.   On: 11/01/2015 12:58   Dg Hand Complete Left  11/02/2015  CLINICAL DATA:  Woke up with chest tightness and pain/swelling in both hands, initial encounter EXAM: LEFT HAND - COMPLETE 3+ VIEW COMPARISON:  None FINDINGS: Diffuse soft tissue swelling. Osseous mineralization normal. Joint spaces preserved. No acute fracture, dislocation, or definite bone destruction. IMPRESSION: Significant soft tissue swelling without acute osseous abnormalities. Electronically Signed   By: Ulyses Southward M.D.   On: 11/02/2015 14:05     CBC  Recent Labs Lab 11/01/15 1043 11/02/15 0339 11/05/15 0537  WBC 8.8 7.0 8.7  HGB 13.3 12.0* 12.9*  HCT 39.3 36.6* 37.8*  PLT 304 264 277  MCV 66.7* 67.3* 67.1*  MCH 22.6* 22.1* 22.9*  MCHC 33.8 32.8 34.1  RDW 15.6* 15.7* 16.1*  LYMPHSABS 1.9  --   --   MONOABS 0.6  --   --   EOSABS 0.1  --   --   BASOSABS 0.1  --   --     Chemistries   Recent Labs Lab 11/01/15 1043 11/02/15 0339 11/03/15 0600 11/04/15 0638 11/05/15 0537  NA 140 141 139 138 138  K 4.9 4.2 4.1 3.9 3.9  CL 102 104 101 101 101  CO2 26 24 24 26 26   GLUCOSE 122* 108* 100* 105* 104*  BUN 13 10 8 7 11   CREATININE 1.26* 1.08 1.07 1.08 1.31*  CALCIUM 9.2 8.9 9.1 9.2 9.1  MG  --   --  1.7 1.9 1.8  AST 23  --   --   --   --   ALT 20  --   --   --   --   ALKPHOS 69  --   --   --   --   BILITOT 1.8*   --   --   --   --    ------------------------------------------------------------------------------------------------------------------ No results for input(s): CHOL, HDL, LDLCALC, TRIG, CHOLHDL, LDLDIRECT in the last 72 hours.  No results found for: HGBA1C ------------------------------------------------------------------------------------------------------------------ No results for input(s):  TSH, T4TOTAL, T3FREE, THYROIDAB in the last 72 hours.  Invalid input(s): FREET3 ------------------------------------------------------------------------------------------------------------------ No results for input(s): VITAMINB12, FOLATE, FERRITIN, TIBC, IRON, RETICCTPCT in the last 72 hours.  Coagulation profile No results for input(s): INR, PROTIME in the last 168 hours.  No results for input(s): DDIMER in the last 72 hours.  Cardiac Enzymes  Recent Labs Lab 11/01/15 1547 11/01/15 2047 11/02/15 0339  TROPONINI 0.09* 0.07* 0.07*   ------------------------------------------------------------------------------------------------------------------    Component Value Date/Time   BNP 300.7* 11/01/2015 1245    Time Spent in minutes  25   Lyah Millirons M.D  Ph.D on 11/05/2015 at 12:19 PM  Between 7am to 7pm - Pager - [phone]  After 7pm go to [email or web address] - password [facility]  [facility] -  Office  [phone]

## 2015-11-06 LAB — BASIC METABOLIC PANEL
Anion gap: 11 (ref 5–15)
BUN: 20 mg/dL (ref 6–20)
CALCIUM: 9.3 mg/dL (ref 8.9–10.3)
CHLORIDE: 100 mmol/L — AB (ref 101–111)
CO2: 27 mmol/L (ref 22–32)
CREATININE: 1.77 mg/dL — AB (ref 0.61–1.24)
GFR, EST AFRICAN AMERICAN: 59 mL/min — AB (ref >60)
GFR, EST NON AFRICAN AMERICAN: 51 mL/min — AB (ref >60)
Glucose, Bld: 100 mg/dL — ABNORMAL HIGH (ref 65–99)
Potassium: 4.2 mmol/L (ref 3.5–5.1)
SODIUM: 138 mmol/L (ref 135–145)

## 2015-11-06 LAB — HEPATIC FUNCTION PANEL
ALT: 18 U/L (ref 17–63)
AST: 22 U/L (ref 15–41)
Albumin: 3 g/dL — ABNORMAL LOW (ref 3.5–5.0)
Alkaline Phosphatase: 62 U/L (ref 38–126)
BILIRUBIN DIRECT: 0.3 mg/dL (ref 0.1–0.5)
BILIRUBIN INDIRECT: 0.8 mg/dL (ref 0.3–0.9)
Total Bilirubin: 1.1 mg/dL (ref 0.3–1.2)
Total Protein: 6.3 g/dL — ABNORMAL LOW (ref 6.5–8.1)

## 2015-11-06 LAB — GLUCOSE, CAPILLARY: Glucose-Capillary: 110 mg/dL — ABNORMAL HIGH (ref 65–99)

## 2015-11-06 LAB — CULTURE, BLOOD (ROUTINE X 2)
CULTURE: NO GROWTH
CULTURE: NO GROWTH

## 2015-11-06 LAB — MAGNESIUM: MAGNESIUM: 2.1 mg/dL (ref 1.7–2.4)

## 2015-11-06 NOTE — Progress Notes (Signed)
Patient Demographics:    Paul Lamb, is a 30 y.o. male, DOB - October 26, 1985, NFA:[id number]  Admit date - 11/01/2015   Admitting Physician Houston Siren, MD  Outpatient Primary MD for the patient is No PCP Per Patient  LOS - 4   Chief Complaint  Patient presents with  . Abdominal Pain  . Hand Pain        Subjective:    [name] today has, No headache, No chest pain, No abdominal pain - No Nausea, No new weakness tingling or numbness, No Cough - Improved shortness of breath. Lower extremity swelling has gone down considerably. Ambulating without assistanc   Assessment  & Plan :     1. Hypertensive emergency with chest tightness and pulmonary edema. Blood pressure much improved, blood pressure regimen titrated with good effect. He is currently on beta blocker, Norvasc, Imdur along with ACE inhibitor, changed to PO lasix 40 bid, SOB symptoms resolved,  Echogram with preserved LVEF  2. Mildly elevated troponin. In none serious pattern due to #1 above, check echogram to evaluate wall motion and EF. Currently on beta blocker and aspirin added. Chest pain-free. Cards input appreciated  3. Morbid obesity with possible undiagnosed obstructive sleep apnea. Had episodes of bradycardia and apnea at night, placed on CPAP, home CPAP ordered per case management, outpatient sleep study also scheduled by case management. Follow with PCP for weight reduction-meets some criterion for Bariatric surgery  4. Elevated d-dimer upon admission. CT angiogram chest unremarkable, Lower extremity venous duplex negative.  5. Acute on chronic diastolic CHF. Likely diastolic with right-sided heart failure as well. Good diuresis, sob improving, weight down, lower edema improving,  continue beta blocker, Imdur, echogram with  preserved lVEF, PA pressure ,  on Unna boot, iv lasix , titration per cardiology, monitor cr/lytes. Appreciate cardiology input.  Filed Weights   11/03/15 0500 11/04/15 0500 11/05/15 0500  Weight: 193.232 kg (426 lb) 180.849 kg (398 lb 11.2 oz) 172.185 kg (379 lb 9.6 oz)    6. Intermittent left upper quadrant abdominal pain. Resolved. Placed on PPI.  7. Mild L Hand swelling after trauma - he had trauma with his car door handle, x-ray stable, soft tissue swelling has improved. No signs of fluctuance or infection. Swelling almost completely resolved.  8. Bronchopneumonia? No fever, no leukocytosis, was on rocephin/zithro on day one then levaquin, now on abx 5 days, mild rising cr, d/c abx 3/16.  9. Constipation, no bm x4 days, start stool softener.     Code Status : Full  Family Communication  : wife  in the room  Disposition Plan  : Likely discharge am  Consults  :  cardiology  Procedures  :   CTA Lungs - No PE, CHF versus early pneumonia.  CT abdomen pelvis. No acute findings.  Echogram - preserved LVEF  Venous duplex - no DVT   DVT Prophylaxis  :  Lovenox    Lab Results  Component Value Date   PLT 277 11/05/2015    Inpatient Medications  Scheduled Meds: . amLODipine  10 mg Oral Daily  . enoxaparin (LOVENOX) injection  90 mg Subcutaneous Q24H  . famotidine  20 mg Oral QHS  . feeding supplement (PRO-STAT SUGAR FREE 64)  30 mL Oral  BID  . furosemide  40 mg Oral BID  . hydrALAZINE  100 mg Oral 3 times per day  . isosorbide mononitrate  60 mg Oral Daily  . labetalol  200 mg Oral BID  . lisinopril  20 mg Oral Daily  . senna-docusate  1 tablet Oral BID   Continuous Infusions:  PRN Meds:.acetaminophen **OR** [DISCONTINUED] acetaminophen, guaiFENesin, hydrALAZINE, HYDROcodone-acetaminophen, [DISCONTINUED] ondansetron **OR** ondansetron (ZOFRAN) IV  Antibiotics  :    Anti-infectives    Start     Dose/Rate Route Frequency Ordered Stop   11/02/15 1000   levofloxacin (LEVAQUIN) tablet 750 mg  Status:  Discontinued     750 mg Oral Every 24 hours 11/01/15 1601 11/05/15 1242   11/02/15 0000  levofloxacin (LEVAQUIN) tablet 750 mg  Status:  Discontinued     750 mg Oral Every 24 hours 11/01/15 1554 11/01/15 1601   11/01/15 1145  cefTRIAXone (ROCEPHIN) 1 g in dextrose 5 % 50 mL IVPB     1 g 100 mL/hr over 30 Minutes Intravenous  Once 11/01/15 1137 11/01/15 1335   11/01/15 1145  azithromycin (ZITHROMAX) 500 mg in dextrose 5 % 250 mL IVPB     500 mg 250 mL/hr over 60 Minutes Intravenous  Once 11/01/15 1137 11/01/15 1549        Objective:   Filed Vitals:   11/05/15 2334 11/06/15 0635 11/06/15 1300 11/06/15 1332  BP: 131/76 136/77 124/58 148/77  Pulse:  97  90  Temp:  98.4 F (36.9 C)  97.6 F (36.4 C)  TempSrc:  Oral  Oral  Resp:  18  18  Height:      Weight:      SpO2:  97%  95%    Wt Readings from Last 3 Encounters:  11/05/15 172.185 kg (379 lb 9.6 oz)  06/22/15 162.388 kg (358 lb)  09/29/11 157.942 kg (348 lb 3.2 oz)     Intake/Output Summary (Last 24 hours) at 11/06/15 1707 Last data filed at 11/06/15 0700  Gross per 24 hour  Intake    360 ml  Output      0 ml  Net    360 ml     Physical Exam  Awake Alert, Oriented X 3, No new F.N deficits, Normal affect, obese .AT,PERRAL Supple Neck,No JVD, No cervical lymphadenopathy appriciated.  Symmetrical Chest wall movement, Good air movement bilaterally, minimal RALES RRR,No Gallops,Rubs or new Murmurs, No Parasternal Heave +ve B.Sounds, Abd Soft, No tenderness, No organomegaly appriciated, No rebound - guarding or rigidity. No Cyanosis, Clubbing , 1+ edema with UNNA boots, No new Rash or bruise       Data Review:   Micro Results Recent Results (from the past 240 hour(s))  Culture, blood (routine x 2) Call MD if unable to obtain prior to antibiotics being given     Status: None   Collection Time: 11/01/15  6:12 PM  Result Value Ref Range Status   Specimen Description  BLOOD RIGHT ANTECUBITAL  Final   Special Requests BOTTLES DRAWN AEROBIC AND ANAEROBIC 5CC  Final   Culture NO GROWTH 5 DAYS  Final   Report Status 11/06/2015 FINAL  Final  Culture, blood (routine x 2) Call MD if unable to obtain prior to antibiotics being given     Status: None   Collection Time: 11/01/15  6:24 PM  Result Value Ref Range Status   Specimen Description BLOOD RIGHT ANTECUBITAL  Final   Special Requests BOTTLES DRAWN AEROBIC AND ANAEROBIC 5CC  Final  Culture NO GROWTH 5 DAYS  Final   Report Status 11/06/2015 FINAL  Final    Radiology Reports Dg Chest 2 View  11/01/2015  CLINICAL DATA:  30 year old male with history of central non radiating chest pain and shortness breath for the past 3 days. Cough. EXAM: CHEST  2 VIEW COMPARISON:  Chest x-ray 05/23/2004. FINDINGS: Diffuse peribronchial cuffing. Patchy airspace disease predominantly throughout the right mid to lower lung. Left lung appears relatively clear. No pneumothorax. No suspicious appearing pulmonary nodules or masses. No evidence of pulmonary edema. Heart size is moderately enlarged (new compared to the prior study). Mild elevation of the right hemidiaphragm. Upper mediastinal contours are within normal limits. IMPRESSION: 1. Peribronchial cuffing with patchy airspace disease throughout the right mid to lower lung, concerning for bronchitis with multilobar bronchopneumonia, most evident in the right middle and lower lobes. 2. In addition, however, there is also now moderate cardiomegaly which is new compared to the prior study. In this age group, clinical correlation for nonischemic cardiomyopathy is recommended, such as viral myocarditis. Electronically Signed   By: Trudie Reed M.D.   On: 11/01/2015 11:32   Ct Angio Chest Pe W/cm &/or Wo Cm  11/01/2015  CLINICAL DATA:  Shortness of breath, abdominal pain. Evaluate for pulmonary embolus or dissection. EXAM: CT ANGIOGRAPHY CHEST, ABDOMEN AND PELVIS TECHNIQUE: Multidetector  CT imaging through the chest, abdomen and pelvis was performed using the standard protocol during bolus administration of intravenous contrast. Multiplanar reconstructed images and MIPs were obtained and reviewed to evaluate the vascular anatomy. CONTRAST:  OMNIPAQUE IOHEXOL 350 MG/ML SOLN COMPARISON:  None. FINDINGS: CTA CHEST FINDINGS Mediastinum/Nodes: There is cardiomegaly. Small pericardial effusion. Aorta is normal caliber. No dissection. No filling defects in the pulmonary arteries to suggest pulmonary emboli. No mediastinal, hilar, or axillary adenopathy. Lungs/Pleura: Mild hazy opacities within the lungs could reflect early edema. Tree-in-bud densities are noted in the inferior right upper lobe and within the right lower lobe concerning for early bronchopneumonia. No confluent opacity on the left. No effusions. Musculoskeletal: Chest wall soft tissues are unremarkable. No acute bony abnormality. Review of the MIP images confirms the above findings. CTA ABDOMEN AND PELVIS FINDINGS Hepatobiliary: Unremarkable appearance.  Gallbladder unremarkable. Pancreas: Unremarkable appearance Spleen: Unremarkable appearance Adrenals/Urinary Tract: No adrenal abnormality. No focal renal abnormality. No stones or hydronephrosis. Urinary bladder is unremarkable. Stomach/Bowel: Normal appendix. Stomach, large and small bowel grossly unremarkable. Vascular/Lymphatic: No evidence of aneurysm or adenopathy. No evidence of dissection. Mesenteric vessels and single renal arteries are widely patent bilaterally. Reproductive: No abnormality. Other: No free fluid or free air. Musculoskeletal: No acute bony abnormality. Review of the MIP images confirms the above findings. IMPRESSION: No evidence of pulmonary embolus. No evidence of aortic aneurysm or dissection. Cardiomegaly, small pericardial effusion. Mild ground-glass opacities within the lungs could reflect early edema. Or focal tree-in-bud densities within the inferior  right upper lobe and adjacent right lower lobe. This could reflect early bronchopneumonia. No acute findings in the abdomen or pelvis. Electronically Signed   By: Charlett Nose M.D.   On: 11/01/2015 12:58   Ct Abdomen Pelvis W Contrast  11/01/2015  CLINICAL DATA:  Shortness of breath, abdominal pain. Evaluate for pulmonary embolus or dissection. EXAM: CT ANGIOGRAPHY CHEST, ABDOMEN AND PELVIS TECHNIQUE: Multidetector CT imaging through the chest, abdomen and pelvis was performed using the standard protocol during bolus administration of intravenous contrast. Multiplanar reconstructed images and MIPs were obtained and reviewed to evaluate the vascular anatomy. CONTRAST:  OMNIPAQUE IOHEXOL  350 MG/ML SOLN COMPARISON:  None. FINDINGS: CTA CHEST FINDINGS Mediastinum/Nodes: There is cardiomegaly. Small pericardial effusion. Aorta is normal caliber. No dissection. No filling defects in the pulmonary arteries to suggest pulmonary emboli. No mediastinal, hilar, or axillary adenopathy. Lungs/Pleura: Mild hazy opacities within the lungs could reflect early edema. Tree-in-bud densities are noted in the inferior right upper lobe and within the right lower lobe concerning for early bronchopneumonia. No confluent opacity on the left. No effusions. Musculoskeletal: Chest wall soft tissues are unremarkable. No acute bony abnormality. Review of the MIP images confirms the above findings. CTA ABDOMEN AND PELVIS FINDINGS Hepatobiliary: Unremarkable appearance.  Gallbladder unremarkable. Pancreas: Unremarkable appearance Spleen: Unremarkable appearance Adrenals/Urinary Tract: No adrenal abnormality. No focal renal abnormality. No stones or hydronephrosis. Urinary bladder is unremarkable. Stomach/Bowel: Normal appendix. Stomach, large and small bowel grossly unremarkable. Vascular/Lymphatic: No evidence of aneurysm or adenopathy. No evidence of dissection. Mesenteric vessels and single renal arteries are widely patent bilaterally.  Reproductive: No abnormality. Other: No free fluid or free air. Musculoskeletal: No acute bony abnormality. Review of the MIP images confirms the above findings. IMPRESSION: No evidence of pulmonary embolus. No evidence of aortic aneurysm or dissection. Cardiomegaly, small pericardial effusion. Mild ground-glass opacities within the lungs could reflect early edema. Or focal tree-in-bud densities within the inferior right upper lobe and adjacent right lower lobe. This could reflect early bronchopneumonia. No acute findings in the abdomen or pelvis. Electronically Signed   By: Charlett Nose M.D.   On: 11/01/2015 12:58   Dg Hand Complete Left  11/02/2015  CLINICAL DATA:  Woke up with chest tightness and pain/swelling in both hands, initial encounter EXAM: LEFT HAND - COMPLETE 3+ VIEW COMPARISON:  None FINDINGS: Diffuse soft tissue swelling. Osseous mineralization normal. Joint spaces preserved. No acute fracture, dislocation, or definite bone destruction. IMPRESSION: Significant soft tissue swelling without acute osseous abnormalities. Electronically Signed   By: Ulyses Southward M.D.   On: 11/02/2015 14:05     CBC  Recent Labs Lab 11/01/15 1043 11/02/15 0339 11/05/15 0537  WBC 8.8 7.0 8.7  HGB 13.3 12.0* 12.9*  HCT 39.3 36.6* 37.8*  PLT 304 264 277  MCV 66.7* 67.3* 67.1*  MCH 22.6* 22.1* 22.9*  MCHC 33.8 32.8 34.1  RDW 15.6* 15.7* 16.1*  LYMPHSABS 1.9  --   --   MONOABS 0.6  --   --   EOSABS 0.1  --   --   BASOSABS 0.1  --   --     Chemistries   Recent Labs Lab 11/01/15 1043 11/02/15 0339 11/03/15 0600 11/04/15 0638 11/05/15 0537 11/06/15 0530  NA 140 141 139 138 138 138  K 4.9 4.2 4.1 3.9 3.9 4.2  CL 102 104 101 101 101 100*  CO2 26 24 24 26 26 27   GLUCOSE 122* 108* 100* 105* 104* 100*  BUN 13 10 8 7 11 20   CREATININE 1.26* 1.08 1.07 1.08 1.31* 1.77*  CALCIUM 9.2 8.9 9.1 9.2 9.1 9.3  MG  --   --  1.7 1.9 1.8 2.1  AST 23  --   --   --   --  22  ALT 20  --   --   --   --  18    ALKPHOS 69  --   --   --   --  62  BILITOT 1.8*  --   --   --   --  1.1   ------------------------------------------------------------------------------------------------------------------ No results for input(s): CHOL, HDL, LDLCALC, TRIG, CHOLHDL,  LDLDIRECT in the last 72 hours.  No results found for: HGBA1C ------------------------------------------------------------------------------------------------------------------ No results for input(s): TSH, T4TOTAL, T3FREE, THYROIDAB in the last 72 hours.  Invalid input(s): FREET3 ------------------------------------------------------------------------------------------------------------------ No results for input(s): VITAMINB12, FOLATE, FERRITIN, TIBC, IRON, RETICCTPCT in the last 72 hours.  Coagulation profile No results for input(s): INR, PROTIME in the last 168 hours.  No results for input(s): DDIMER in the last 72 hours.  Cardiac Enzymes  Recent Labs Lab 11/01/15 1547 11/01/15 2047 11/02/15 0339  TROPONINI 0.09* 0.07* 0.07*   ------------------------------------------------------------------------------------------------------------------    Component Value Date/Time   BNP 300.7* 11/01/2015 1245    Pleas Koch, MD [facility] ([phone]

## 2015-11-07 LAB — BASIC METABOLIC PANEL
ANION GAP: 10 (ref 5–15)
BUN: 17 mg/dL (ref 6–20)
CALCIUM: 9.1 mg/dL (ref 8.9–10.3)
CHLORIDE: 104 mmol/L (ref 101–111)
CO2: 24 mmol/L (ref 22–32)
Creatinine, Ser: 1.25 mg/dL — ABNORMAL HIGH (ref 0.61–1.24)
GFR calc non Af Amer: >60 mL/min (ref >60)
GLUCOSE: 98 mg/dL (ref 65–99)
POTASSIUM: 4.1 mmol/L (ref 3.5–5.1)
Sodium: 138 mmol/L (ref 135–145)

## 2015-11-07 MED ORDER — LISINOPRIL 20 MG PO TABS: 20.0000 mg | ORAL_TABLET | Freq: Every day | ORAL | Status: DC

## 2015-11-07 MED ORDER — ISOSORBIDE MONONITRATE ER 60 MG PO TB24: 60.0000 mg | ORAL_TABLET | Freq: Every day | ORAL | Status: DC

## 2015-11-07 MED ORDER — AMLODIPINE BESYLATE 10 MG PO TABS: 10.0000 mg | ORAL_TABLET | Freq: Every day | ORAL | Status: DC

## 2015-11-07 MED ORDER — LABETALOL HCL 200 MG PO TABS: 200.0000 mg | ORAL_TABLET | Freq: Two times a day (BID) | ORAL | Status: DC

## 2015-11-07 MED ORDER — HYDRALAZINE HCL 100 MG PO TABS: 100.0000 mg | ORAL_TABLET | Freq: Three times a day (TID) | ORAL | Status: DC

## 2015-11-07 MED ORDER — FUROSEMIDE 40 MG PO TABS: 40.0000 mg | ORAL_TABLET | Freq: Two times a day (BID) | ORAL | Status: DC

## 2015-11-07 NOTE — Progress Notes (Signed)
IV DCd, instructions given to pt who verbalized understanding. Prescriptions given to pt. Assessment at time of DC as per this am's documented assessment. All belongings in room with patient. Pt's S/O transported pt home. Pt left at 13:30

## 2015-11-07 NOTE — Discharge Summary (Signed)
Physician Discharge Summary  Paul Lamb WJX:[id number] DOB: 02-02-1986 DOA: 11/01/2015  PCP: No PCP Per Patient  Admit date: 11/01/2015 Discharge date: 11/07/2015  Time spent: 35 minutes  Recommendations for Outpatient Follow-up:  1. bmet and close Cardiology f/uu 3/22 2. Given Rx list as choisce for meds 3. 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 Referred to [facility] for OP care-I think elegible for Bariatric referral 5. Needs OP nutritionist input 6. Sleep study set-up already 7. Wear Unna boots daily when stationary-if moving around a lot may take off for 2-3 hours a day   Discharge Diagnoses:  Active Problems:   Cough   Hypertensive emergency   Hand pain, left   Morbid obesity (HCC)   Bilateral lower extremity edema   Elevated serum creatinine   Chest tightness   Abnormal EKG   DOE (dyspnea on exertion)   Malignant hypertension   Peripheral edema   Pulmonary edema cardiac cause (HCC)   AKI (acute kidney injury) (HCC)   Cardiomegaly - hypertensive   Elevated d-dimer   SOB (shortness of breath)   Discharge Condition: imporved  Diet recommendation: hh low salt  Filed Weights   11/03/15 0500 11/04/15 0500 11/05/15 0500  Weight: 193.232 kg (426 lb) 180.849 kg (398 lb 11.2 oz) 172.185 kg (379 lb 9.6 oz)    History of present illness:  30 yo with Morbid obesity, Body mass index is 48.72 kg/(m^2)., poor compliant, no meds or medical care due to lack of insurance, likely having sleep apnea, presented to the ER with HTN pulmonary edema. He improved with getting his BP controlled. See below  Hospital Course:  . Hypertensive emergency with chest tightness and pulmonary edema. Blood pressure much improved, blood pressure regimen titrated with good effect. He is currently on beta blocker, Norvasc, Imdur along with ACE inhibitor, changed to PO lasix 40 bid, SOB symptoms resolved, Echogram with preserved  LVEF  2. Mildly elevated troponin. In none serious pattern due to #1 above, check echogram to evaluate wall motion and EF. Currently on beta blocker and aspirin added. Chest pain-free. Cards input appreciated  3. Morbid obesity with possible undiagnosed obstructive sleep apnea. Had episodes of bradycardia and apnea at night, placed on CPAP, home CPAP ordered per case management, outpatient sleep study also scheduled by case management. Follow with PCP for weight reduction-meets some criterion for Bariatric surgery  4. Elevated d-dimer upon admission. CT angiogram chest unremarkable, Lower extremity venous duplex negative.  5. Acute on chronic diastolic CHF. Likely diastolic with right-sided heart failure as well. Good diuresis, sob improving, weight down, lower edema improving, continue beta blocker, Imdur, echogram with preserved lVEF, PA pressure , on Unna boot, iv lasix , titration per cardiology, monitor cr/lytes. Appreciate cardiology input.  Filed Weights   11/03/15 0500 11/04/15 0500 11/05/15 0500  Weight: 193.232 kg (426 lb) 180.849 kg (398 lb 11.2 oz) 172.185 kg (379 lb 9.6 oz)    6. Intermittent left upper quadrant abdominal pain. Resolved. Placed on PPI.  7. Mild L Hand swelling after trauma - he had trauma with his car door handle, x-ray stable, soft tissue swelling has improved. No signs of fluctuance or infection. Swelling almost completely resolved.  8. Bronchopneumonia? No fever, no leukocytosis, was on rocephin/zithro on day one then levaquin, now on abx 5 days, mild rising cr, d/c abx 3/16.  9. Constipation, no bm x4 days, start stool softener.  Discharge Exam: Filed Vitals:   11/06/15 2048 11/07/15 0500  BP: 129/72 136/84  Pulse: 91 83  Temp: 98 F (36.7 C) 98.2 F (36.8 C)  Resp: 18 17    General: Alert pleasant oriented no apparent distress Cardiovascular: S1-S2 no murmur rub or gallop noted JVD Respiratory: Clinically clear no added  sound  Discharge Instructions   Discharge Instructions    Diet - low sodium heart healthy    Complete by:  As directed      Discharge instructions    Complete by:  As directed   Recommend weighing yourself daily and if he gains more than 2 to 3 kg over a 24-hour period of time, take an extra dose of oral Lasix tablet  U will need to follow-up with primary care physician closely You will also need to follow-up closely with a lung doctor It might be beneficial to think about a different type of job given 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 Qty: 30 tablet, Refills: 2    hydrALAZINE (APRESOLINE) 100 MG tablet Take 1 tablet (100 mg total) by mouth every 8 (eight) hours. Qty: 90 tablet, Refills: 2    isosorbide mononitrate (IMDUR) 60 MG 24 hr tablet Take 1 tablet (60 mg total) by mouth daily. Qty: 30 tablet, Refills: 2      CONTINUE these medications which have CHANGED   Details  amLODipine (NORVASC) 10 MG tablet Take 1 tablet (10 mg total) by mouth daily. Qty: 30 tablet, Refills: 2    labetalol (NORMODYNE) 200 MG tablet Take 1 tablet (200 mg total) by mouth 2 (two) times daily. Qty: 60 tablet, Refills: 2    lisinopril (PRINIVIL,ZESTRIL) 20 MG tablet Take 1 tablet (20 mg total) by mouth daily. Qty: 30 tablet, Refills: 2      CONTINUE these medications which have NOT CHANGED   Details  guaiFENesin (MUCINEX) 600 MG 12 hr tablet Take 600 mg by mouth 2 (two) times daily as needed for cough or to loosen phlegm.      Multiple Vitamins-Minerals (AIRBORNE) TBEF Take 1 tablet by mouth daily as needed (for immune).    Pseudoephedrine-APAP-DM (DAYQUIL MULTI-SYMPTOM COLD/FLU PO) Take 2 capsules by mouth daily as needed (for cold).    traMADol (ULTRAM) 50 MG tablet Take 1 tablet (50 mg total) by mouth every 6 (six) hours as needed. Qty: 15 tablet, Refills: 0      STOP taking these medications     losartan-hydrochlorothiazide (HYZAAR) 100-12.5 MG tablet        No Known Allergies Follow-up Information    Follow up with [facility] On 11/10/2015.   Why:  Follow up appointment Tuesday 11/10/15 at 4pm with Dr. Julien Nordmann.   Contact information:   [address] [place] [address] [phone]      Follow up with [facility] On 01/07/2019.   Why:  Appointment on 01/07/16  at 8pm.    Contact information:   [address] [place] [address] [phone]      Follow up with Wilburt Finlay, PA-C On 11/25/2015.   Specialties:  Physician Assistant, Radiology, Interventional Cardiology   Why:  11:30AM. Cardiology.   Contact information:   [address] [place] [address] [phone]       Follow up with [facility] On 11/11/2015.   Specialty:  Cardiology   Why:  Obtain BMET lab to check kidney function after starting on lasix. Anytime during lab hours, no fasting needed   Contact information:   [address] [place] [address] [phone]       The results of significant diagnostics from this hospitalization (including imaging, microbiology, ancillary and laboratory) are listed below for reference.    Significant Diagnostic Studies: Dg Chest 2 View  11/01/2015  CLINICAL DATA:  30 year old male with history of central non radiating chest pain and shortness breath for the past 3 days. Cough. EXAM: CHEST  2 VIEW COMPARISON:  Chest x-ray 05/23/2004.  FINDINGS: Diffuse peribronchial cuffing. Patchy airspace disease predominantly throughout the right mid to lower lung. Left lung appears relatively clear. No pneumothorax. No suspicious appearing pulmonary nodules or masses. No evidence of pulmonary edema. Heart size is moderately enlarged (new compared to the prior study). Mild elevation of the right hemidiaphragm. Upper mediastinal contours are within normal limits. IMPRESSION: 1. Peribronchial cuffing with patchy airspace disease throughout the right mid to lower lung, concerning for bronchitis with multilobar bronchopneumonia, most evident in the right middle and lower lobes. 2. In addition, however, there is also now moderate cardiomegaly which is new compared to the prior study. In this age group, clinical correlation for nonischemic cardiomyopathy is recommended, such as viral myocarditis. Electronically Signed   By: Trudie Reed M.D.   On: 11/01/2015 11:32   Ct Angio Chest Pe W/cm &/or Wo Cm  11/01/2015  CLINICAL DATA:  Shortness of breath, abdominal pain. Evaluate for pulmonary embolus or dissection. EXAM: CT ANGIOGRAPHY CHEST, ABDOMEN AND PELVIS TECHNIQUE: Multidetector CT imaging through the chest, abdomen and pelvis was performed using the standard protocol during bolus administration of intravenous contrast. Multiplanar reconstructed images and MIPs were obtained and reviewed to evaluate the vascular anatomy. CONTRAST:  OMNIPAQUE IOHEXOL 350 MG/ML SOLN COMPARISON:  None. FINDINGS: CTA CHEST FINDINGS Mediastinum/Nodes: There is cardiomegaly. Small pericardial effusion. Aorta is normal caliber. No dissection. No filling defects in the pulmonary arteries to suggest pulmonary emboli. No mediastinal, hilar, or axillary adenopathy. Lungs/Pleura: Mild hazy opacities within the lungs could reflect early edema. Tree-in-bud densities are noted in the inferior right upper lobe and within the right lower lobe concerning for early bronchopneumonia. No  confluent opacity on the left. No effusions. Musculoskeletal: Chest wall soft tissues are unremarkable. No acute bony abnormality. Review of the MIP images confirms the above findings. CTA ABDOMEN AND PELVIS FINDINGS Hepatobiliary: Unremarkable appearance.  Gallbladder unremarkable. Pancreas: Unremarkable appearance Spleen: Unremarkable appearance Adrenals/Urinary Tract: No adrenal abnormality. No focal renal abnormality. No stones or hydronephrosis. Urinary bladder is unremarkable. Stomach/Bowel: Normal appendix. Stomach, large and small bowel grossly unremarkable. Vascular/Lymphatic: No evidence of aneurysm or adenopathy. No evidence of dissection. Mesenteric vessels and single renal arteries are widely patent bilaterally. Reproductive: No abnormality. Other: No free fluid or free air. Musculoskeletal: No acute bony abnormality. Review of the MIP images confirms the above findings. IMPRESSION: No evidence of pulmonary embolus. No evidence of aortic  aneurysm or dissection. Cardiomegaly, small pericardial effusion. Mild ground-glass opacities within the lungs could reflect early edema. Or focal tree-in-bud densities within the inferior right upper lobe and adjacent right lower lobe. This could reflect early bronchopneumonia. No acute findings in the abdomen or pelvis. Electronically Signed   By: Charlett Nose M.D.   On: 11/01/2015 12:58   Ct Abdomen Pelvis W Contrast  11/01/2015  CLINICAL DATA:  Shortness of breath, abdominal pain. Evaluate for pulmonary embolus or dissection. EXAM: CT ANGIOGRAPHY CHEST, ABDOMEN AND PELVIS TECHNIQUE: Multidetector CT imaging through the chest, abdomen and pelvis was performed using the standard protocol during bolus administration of intravenous contrast. Multiplanar reconstructed images and MIPs were obtained and reviewed to evaluate the vascular anatomy. CONTRAST:  OMNIPAQUE IOHEXOL 350 MG/ML SOLN COMPARISON:  None. FINDINGS: CTA CHEST FINDINGS Mediastinum/Nodes: There is  cardiomegaly. Small pericardial effusion. Aorta is normal caliber. No dissection. No filling defects in the pulmonary arteries to suggest pulmonary emboli. No mediastinal, hilar, or axillary adenopathy. Lungs/Pleura: Mild hazy opacities within the lungs could reflect early edema. Tree-in-bud densities are noted in the inferior right upper lobe and within the right lower lobe concerning for early bronchopneumonia. No confluent opacity on the left. No effusions. Musculoskeletal: Chest wall soft tissues are unremarkable. No acute bony abnormality. Review of the MIP images confirms the above findings. CTA ABDOMEN AND PELVIS FINDINGS Hepatobiliary: Unremarkable appearance.  Gallbladder unremarkable. Pancreas: Unremarkable appearance Spleen: Unremarkable appearance Adrenals/Urinary Tract: No adrenal abnormality. No focal renal abnormality. No stones or hydronephrosis. Urinary bladder is unremarkable. Stomach/Bowel: Normal appendix. Stomach, large and small bowel grossly unremarkable. Vascular/Lymphatic: No evidence of aneurysm or adenopathy. No evidence of dissection. Mesenteric vessels and single renal arteries are widely patent bilaterally. Reproductive: No abnormality. Other: No free fluid or free air. Musculoskeletal: No acute bony abnormality. Review of the MIP images confirms the above findings. IMPRESSION: No evidence of pulmonary embolus. No evidence of aortic aneurysm or dissection. Cardiomegaly, small pericardial effusion. Mild ground-glass opacities within the lungs could reflect early edema. Or focal tree-in-bud densities within the inferior right upper lobe and adjacent right lower lobe. This could reflect early bronchopneumonia. No acute findings in the abdomen or pelvis. Electronically Signed   By: Charlett Nose M.D.   On: 11/01/2015 12:58   Dg Hand Complete Left  11/02/2015  CLINICAL DATA:  Woke up with chest tightness and pain/swelling in both hands, initial encounter EXAM: LEFT HAND - COMPLETE 3+ VIEW  COMPARISON:  None FINDINGS: Diffuse soft tissue swelling. Osseous mineralization normal. Joint spaces preserved. No acute fracture, dislocation, or definite bone destruction. IMPRESSION: Significant soft tissue swelling without acute osseous abnormalities. Electronically Signed   By: Ulyses Southward M.D.   On: 11/02/2015 14:05    Microbiology: Recent Results (from the past 240 hour(s))  Culture, blood (routine x 2) Call MD if unable to obtain prior to antibiotics being given     Status: None   Collection Time: 11/01/15  6:12 PM  Result Value Ref Range Status   Specimen Description BLOOD RIGHT ANTECUBITAL  Final   Special Requests BOTTLES DRAWN AEROBIC AND ANAEROBIC 5CC  Final   Culture NO GROWTH 5 DAYS  Final   Report Status 11/06/2015 FINAL  Final  Culture, blood (routine x 2) Call MD if unable to obtain prior to antibiotics being given     Status: None   Collection Time: 11/01/15  6:24 PM  Result Value Ref Range Status   Specimen Description BLOOD RIGHT ANTECUBITAL  Final  Special Requests BOTTLES DRAWN AEROBIC AND ANAEROBIC 5CC  Final   Culture NO GROWTH 5 DAYS  Final   Report Status 11/06/2015 FINAL  Final     Labs: Basic Metabolic Panel:  Recent Labs Lab 11/03/15 0600 11/04/15 0638 11/05/15 0537 11/06/15 0530 11/07/15 0711  NA 139 138 138 138 138  K 4.1 3.9 3.9 4.2 4.1  CL 101 101 101 100* 104  CO2 24 26 26 27 24   GLUCOSE 100* 105* 104* 100* 98  BUN 8 7 11 20 17   CREATININE 1.07 1.08 1.31* 1.77* 1.25*  CALCIUM 9.1 9.2 9.1 9.3 9.1  MG 1.7 1.9 1.8 2.1  --    Liver Function Tests:  Recent Labs Lab 11/01/15 1043 11/06/15 0530  AST 23 22  ALT 20 18  ALKPHOS 69 62  BILITOT 1.8* 1.1  PROT 6.8 6.3*  ALBUMIN 3.2* 3.0*    Recent Labs Lab 11/01/15 1043  LIPASE 24   No results for input(s): AMMONIA in the last 168 hours. CBC:  Recent Labs Lab 11/01/15 1043 11/02/15 0339 11/05/15 0537  WBC 8.8 7.0 8.7  NEUTROABS 6.1  --   --   HGB 13.3 12.0* 12.9*  HCT 39.3  36.6* 37.8*  MCV 66.7* 67.3* 67.1*  PLT 304 264 277   Cardiac Enzymes:  Recent Labs Lab 11/01/15 1547 11/01/15 2047 11/02/15 0339  TROPONINI 0.09* 0.07* 0.07*   BNP: BNP (last 3 results)  Recent Labs  11/01/15 1245  BNP 300.7*    ProBNP (last 3 results) No results for input(s): PROBNP in the last 8760 hours.  CBG:  Recent Labs Lab 11/02/15 1242 11/02/15 2304 11/03/15 0653 11/05/15 1120  GLUCAP 113* 120* 112* 110*       Signed:  Rhetta Mura MD   [facility] 11/07/2015, 10:35 AM

## 2015-11-10 ENCOUNTER — Encounter: Payer: Self-pay | Admitting: Internal Medicine

## 2015-11-10 ENCOUNTER — Ambulatory Visit: Payer: Self-pay | Attending: Internal Medicine | Admitting: Internal Medicine

## 2015-11-10 VITALS — BP 214/136 | HR 110 | Temp 98.6°F | Resp 15 | Ht 72.0 in | Wt 397.0 lb

## 2015-11-10 DIAGNOSIS — R609 Edema, unspecified: Secondary | ICD-10-CM

## 2015-11-10 DIAGNOSIS — IMO0001 Reserved for inherently not codable concepts without codable children: Secondary | ICD-10-CM

## 2015-11-10 DIAGNOSIS — I161 Hypertensive emergency: Secondary | ICD-10-CM

## 2015-11-10 DIAGNOSIS — I501 Left ventricular failure: Secondary | ICD-10-CM

## 2015-11-10 DIAGNOSIS — Z79899 Other long term (current) drug therapy: Secondary | ICD-10-CM | POA: Insufficient documentation

## 2015-11-10 DIAGNOSIS — Z9114 Patient's other noncompliance with medication regimen: Secondary | ICD-10-CM | POA: Insufficient documentation

## 2015-11-10 DIAGNOSIS — I10 Essential (primary) hypertension: Secondary | ICD-10-CM | POA: Insufficient documentation

## 2015-11-10 DIAGNOSIS — R030 Elevated blood-pressure reading, without diagnosis of hypertension: Secondary | ICD-10-CM

## 2015-11-10 MED ORDER — ISOSORBIDE MONONITRATE ER 60 MG PO TB24: 60.0000 mg | ORAL_TABLET | Freq: Every day | ORAL | Status: DC

## 2015-11-10 MED ORDER — FUROSEMIDE 40 MG PO TABS: 40.0000 mg | ORAL_TABLET | Freq: Two times a day (BID) | ORAL | Status: DC

## 2015-11-10 MED ORDER — METOPROLOL TARTRATE 12.5 MG HALF TABLET
100.0000 mg | ORAL_TABLET | Freq: Once | ORAL | Status: AC
  Administered 2015-11-10: 100 mg via ORAL

## 2015-11-10 MED ORDER — LABETALOL HCL 200 MG PO TABS: 200.0000 mg | ORAL_TABLET | Freq: Two times a day (BID) | ORAL | Status: DC

## 2015-11-10 MED ORDER — HYDRALAZINE HCL 100 MG PO TABS: 100.0000 mg | ORAL_TABLET | Freq: Three times a day (TID) | ORAL | Status: DC

## 2015-11-10 MED ORDER — CLONIDINE HCL 0.1 MG PO TABS
0.2000 mg | ORAL_TABLET | Freq: Once | ORAL | Status: AC
  Administered 2015-11-10: 0.2 mg via ORAL

## 2015-11-10 MED ORDER — AMLODIPINE BESYLATE 10 MG PO TABS: 10.0000 mg | ORAL_TABLET | Freq: Every day | ORAL | Status: DC

## 2015-11-10 MED ORDER — LISINOPRIL 20 MG PO TABS: 20.0000 mg | ORAL_TABLET | Freq: Every day | ORAL | Status: DC

## 2015-11-10 MED FILL — FUROSEMIDE 40 MG TABLET: 40 | 15 days supply | Qty: 30 | Fill #0

## 2015-11-10 MED FILL — hydrALAZINE HCL 100 MG TABS: 100 | 30 days supply | Qty: 90 | Fill #0

## 2015-11-10 MED FILL — LABETALOL HCL 200 MG TABLET: 200 | 30 days supply | Qty: 60 | Fill #0

## 2015-11-10 MED FILL — AMLODIPINE BESYLATE 10 MG T: 10 | 30 days supply | Qty: 30 | Fill #0

## 2015-11-10 MED FILL — LISINOPRIL 20 MG TABLET: 20 | 30 days supply | Qty: 30 | Fill #0

## 2015-11-10 MED FILL — ISOSORBIDE MN ER 60 MG TAB: 60 | 30 days supply | Qty: 30 | Fill #0

## 2015-11-10 NOTE — Patient Instructions (Addendum)
[facility] 2 wks - financials services - pharmacy  DASH Eating Plan DASH stands for "Dietary Approaches to Stop Hypertension." The DASH eating plan is a healthy eating plan that has been shown to reduce high blood pressure (hypertension). Additional health benefits may include reducing the risk of type 2 diabetes mellitus, heart disease, and stroke. The DASH eating plan may also help with weight loss. WHAT DO I NEED TO KNOW ABOUT THE DASH EATING PLAN? For the DASH eating plan, you will follow these general guidelines:  Choose foods with a percent daily value for sodium of less than 5% (as listed on the food label).  Use salt-free seasonings or herbs instead of table salt or sea salt.  Check with your health care provider or pharmacist before using salt substitutes.  Eat lower-sodium products, often labeled as "lower sodium" or "no salt added."  Eat fresh foods.  Eat more vegetables, fruits, and low-fat dairy products.  Choose whole grains. Look for the word "whole" as the first word in the ingredient list.  Choose fish and skinless chicken or [place] more often than red meat. Limit fish, poultry, and meat to 6 oz (170 g) each day.  Limit sweets, desserts, sugars, and sugary drinks.  Choose heart-healthy fats.  Limit cheese to 1 oz (28 g) per day.  Eat more home-cooked food and less restaurant, buffet, and fast food.  Limit fried foods.  Cook foods using methods other than frying.  Limit canned vegetables. If you do use them, rinse them well to decrease the sodium.  When eating at a restaurant, ask that your food be prepared with less salt, or no salt if possible. WHAT FOODS CAN I EAT? Seek help from a dietitian for individual calorie needs. Grains Whole grain or whole wheat bread. Brown rice. Whole grain or whole wheat pasta. Quinoa, bulgur, and whole grain cereals. Low-sodium cereals. Corn or whole wheat flour tortillas. Whole grain cornbread. Whole grain  crackers. Low-sodium crackers. Vegetables Fresh or frozen vegetables (raw, steamed, roasted, or grilled). Low-sodium or reduced-sodium tomato and vegetable juices. Low-sodium or reduced-sodium tomato sauce and paste. Low-sodium or reduced-sodium canned vegetables.  Fruits All fresh, canned (in natural juice), or frozen fruits. Meat and Other Protein Products Ground beef (85% or leaner), grass-fed beef, or beef trimmed of fat. Skinless chicken or [place]. Ground chicken or [place]. Pork trimmed of fat. All fish and seafood. Eggs. Dried beans, peas, or lentils. Unsalted nuts and seeds. Unsalted canned beans. Dairy Low-fat dairy products, such as skim or 1% milk, 2% or reduced-fat cheeses, low-fat ricotta or cottage cheese, or plain low-fat yogurt. Low-sodium or reduced-sodium cheeses. Fats and Oils Tub margarines without trans fats. Light or reduced-fat mayonnaise and salad dressings (reduced sodium). Avocado. Safflower, olive, or canola oils. Natural peanut or almond butter. Other Unsalted popcorn and pretzels. The items listed above may not be a complete list of recommended foods or beverages. Contact your dietitian for more options. WHAT FOODS ARE NOT RECOMMENDED? Grains White bread. White pasta. White rice. Refined cornbread. Bagels and croissants. Crackers that contain trans fat. Vegetables Creamed or fried vegetables. Vegetables in a cheese sauce. Regular canned vegetables. Regular canned tomato sauce and paste. Regular tomato and vegetable juices. Fruits Dried fruits. Canned fruit in light or heavy syrup. Fruit juice. Meat and Other Protein Products Fatty cuts of meat. Ribs, chicken wings, bacon, sausage, bologna, salami, chitterlings, fatback, hot dogs, bratwurst, and packaged luncheon meats. Salted nuts and seeds. Canned beans with salt. Dairy Whole  or 2% milk, cream, half-and-half, and cream cheese. Whole-fat or sweetened yogurt. Full-fat cheeses or blue cheese. Nondairy creamers and  whipped toppings. Processed cheese, cheese spreads, or cheese curds. Condiments Onion and garlic salt, seasoned salt, table salt, and sea salt. Canned and packaged gravies. Worcestershire sauce. Tartar sauce. Barbecue sauce. Teriyaki sauce. Soy sauce, including reduced sodium. Steak sauce. Fish sauce. Oyster sauce. Cocktail sauce. Horseradish. Ketchup and mustard. Meat flavorings and tenderizers. Bouillon cubes. Hot sauce. Tabasco sauce. Marinades. Taco seasonings. Relishes. Fats and Oils Butter, stick margarine, lard, shortening, ghee, and bacon fat. Coconut, palm kernel, or palm oils. Regular salad dressings. Other Pickles and olives. Salted popcorn and pretzels. The items listed above may not be a complete list of foods and beverages to avoid. Contact your dietitian for more information. WHERE CAN I FIND MORE INFORMATION? National Heart, Lung, and Blood Institute: [employer]   This information is not intended to replace advice given to you by your health care provider. Make sure you discuss any questions you have with your health care provider.   Document Released: 07/28/2011 Document Revised: 08/29/2014 Document Reviewed: 06/12/2013 Elsevier Interactive Patient Education [employer]

## 2015-11-10 NOTE — Progress Notes (Signed)
Reports no pain Did not pick up his medications

## 2015-11-10 NOTE — Progress Notes (Signed)
Paul Lamb, is a 30 y.o. male  GTX:[id number]  YQM:[id number]  DOB - 01-05-1986  CC: No chief complaint on file.      HPI: Paul Lamb is a 30 y.o. male here today to establish medical care.  He was recently admitted to hospital 3/12- 3/18 for htn emergency, pulmonary edema, chest pain, osa, acute on chronic diastolic chf.  Pt has not picked up his cpap yet.  He was dc'd Saturday, and did not pick up his meds due to cost.  He than went out of town for work, he is a Naval architect, and didn't get back til last night.     Patient has No headache, No chest pain, No abdominal pain - No Nausea, No new weakness tingling or numbness, No Cough - SOB.    No Known Allergies Past Medical History  Diagnosis Date  . Hypertension    Current Outpatient Prescriptions on File Prior to Visit  Medication Sig Dispense Refill  . guaiFENesin (MUCINEX) 600 MG 12 hr tablet Take 600 mg by mouth 2 (two) times daily as needed for cough or to loosen phlegm. Reported on 11/10/2015    . Multiple Vitamins-Minerals (AIRBORNE) TBEF Take 1 tablet by mouth daily as needed (for immune). Reported on 11/10/2015    . Pseudoephedrine-APAP-DM (DAYQUIL MULTI-SYMPTOM COLD/FLU PO) Take 2 capsules by mouth daily as needed (for cold). Reported on 11/10/2015    . traMADol (ULTRAM) 50 MG tablet Take 1 tablet (50 mg total) by mouth every 6 (six) hours as needed. (Patient not taking: Reported on 11/10/2015) 15 tablet 0   No current facility-administered medications on file prior to visit.   Family History  Problem Relation Age of Onset  . Hypertension    . Diabetes Mellitus II     Social History   Social History  . Marital Status: Single    Spouse Name: N/A  . Number of Children: N/A  . Years of Education: N/A   Occupational History  . Not on file.   Social History Main Topics  . Smoking status: Never Smoker   . Smokeless tobacco: Never Used  . Alcohol Use: No  . Drug Use: No  . Sexual Activity: Not on file    Other Topics Concern  . Not on file   Social History Narrative    Review of Systems: Constitutional: Negative for fever, chills, diaphoresis, activity change, appetite change and fatigue.  +weight gain noticed since he was discharge.   HENT: Negative for ear pain, nosebleeds, congestion, facial swelling, rhinorrhea, neck pain, neck stiffness and ear discharge.  Eyes: Negative for pain, discharge, redness, itching and visual disturbance. Respiratory: Negative for cough, choking, chest tightness, shortness of breath, wheezing and stridor.  Cardiovascular: Negative for chest pain, palpitations.  +leg swelling. Gastrointestinal: Negative for abdominal distention. Genitourinary: Negative for dysuria, urgency, frequency, hematuria, flank pain, decreased urine volume, difficulty urinating and dyspareunia.  Musculoskeletal: Negative for back pain, joint swelling, arthralgia and gait problem. Neurological: Negative for dizziness, tremors, seizures, syncope, facial asymmetry, speech difficulty, weakness, light-headedness, numbness and headaches.  Hematological: Negative for adenopathy. Does not bruise/bleed easily. Psychiatric/Behavioral: Negative for hallucinations, behavioral problems, confusion, dysphoric mood, decreased concentration and agitation.    Objective:   Filed Vitals:   11/10/15 1627 11/10/15 1702  BP: 180/140 214/136  Pulse: 110   Temp: 98.6 F (37 C)   Resp: 15    Please see rn flow sheet.  Repeat by me after metoprolol  189/105, difficult bp chk due to morbid  obesity, used forearm/large cuff.    Physical Exam: Constitutional: Patient appears well-developed and well-nourished. No distress. Extreme morbid obesity.  Pleasant, aaox.3, nontoxic HENT: Normocephalic, atraumatic, External right and left ear normal. Oropharynx is clear and moist.  Eyes: Conjunctivae and EOM are normal. PERRL, no scleral icterus. Neck: Normal ROM. Neck supple. No JVD. No tracheal deviation. No  thyromegaly. CVS: RRR, S1/S2 +, no murmurs, no gallops, no carotid bruit.     2+ pitting edema bilat le. Pulmonary: Effort and breath sounds normal, no stridor, rhonchi, wheezes, rales.  Abdominal: Soft. Obese, BS +, no distension, tenderness, rebound or guarding.  Musculoskeletal: Normal range of motion. No edema and no tenderness.  Lymphadenopathy: No lymphadenopathy noted, cervical, inguinal or axillary Neuro: Alert. Normal reflexes, muscle tone coordination. No cranial nerve deficit grossly. Skin: Skin is warm and dry. No rash noted. Not diaphoretic. No erythema. No pallor. Psychiatric: Normal mood and affect. Behavior, judgment, thought content normal.  Lab Results  Component Value Date   WBC 8.7 11/05/2015   HGB 12.9* 11/05/2015   HCT 37.8* 11/05/2015   MCV 67.1* 11/05/2015   PLT 277 11/05/2015   Lab Results  Component Value Date   CREATININE 1.25* 11/07/2015   BUN 17 11/07/2015   NA 138 11/07/2015   K 4.1 11/07/2015   CL 104 11/07/2015   CO2 24 11/07/2015    No results found for: HGBA1C Lipid Panel  No results found for: CHOL, TRIG, HDL, CHOLHDL, VLDL, LDLCALC     Assessment and plan:   1. Elevated blood pressure due to medication noncompliance. - he did not pick up any meds b/c of cost. - given  cloNIDine (CATAPRES) tablet 0.2 mg; Take 2 tablets (0.2 mg total) by mouth once. - repeat bp still elavated, 214/136, pt was given 2 tabs of metoprolol tartrate  =  now. - repeat bp slightly better rechked mannually by me. - fu w [facility] 2 wks - fu w/ me 1 month  2. Hypertensive emergency, recent hospitalization. Pt currentyly w/o any chest pains/ha/neuro changes, I spoke with our pharmacy and he will be able to pick up all his meds today. - pt encouraged to take all meds as prescribed. - DASH/low salt diet encourage and discussed extensively. - fu   3. Recent hospitalization for htn emergency with pulmonary edema and acute on chronic diastolic  chf - slight le edema noted on exam, lungs clear - lasix prescribed, - close fu  4. osa  - he was given cpap machine to pick up, but hasnt yet  - encouraged him to call the company, he said he would tomorw  - has Sleep Study scheduled May 18 at 8pm, encouraged to keep  5. Morbid obesity, unspecified obesity type (HCC) - encourage diet/exericise     Return in about 4 weeks (around 12/08/2015).  The patient was given clear instructions to go to ER or return to medical center if symptoms don't improve, worsen or new problems develop. The patient verbalized understanding. The patient was told to call to get lab results if they haven't heard anything in the next week.     Pete Glatter, MD, MBA/MHA [facility] [place], [place] [phone]   11/10/2015, 5:45 PM

## 2015-11-12 ENCOUNTER — Other Ambulatory Visit: Payer: Self-pay

## 2015-11-24 ENCOUNTER — Ambulatory Visit: Payer: Self-pay | Attending: Internal Medicine | Admitting: Pharmacist

## 2015-11-24 VITALS — BP 150/90 | HR 96

## 2015-11-24 DIAGNOSIS — R51 Headache: Secondary | ICD-10-CM | POA: Insufficient documentation

## 2015-11-24 DIAGNOSIS — Z713 Dietary counseling and surveillance: Secondary | ICD-10-CM | POA: Insufficient documentation

## 2015-11-24 DIAGNOSIS — IMO0001 Reserved for inherently not codable concepts without codable children: Secondary | ICD-10-CM

## 2015-11-24 DIAGNOSIS — I10 Essential (primary) hypertension: Secondary | ICD-10-CM | POA: Insufficient documentation

## 2015-11-24 DIAGNOSIS — Z79899 Other long term (current) drug therapy: Secondary | ICD-10-CM | POA: Insufficient documentation

## 2015-11-24 DIAGNOSIS — R030 Elevated blood-pressure reading, without diagnosis of hypertension: Secondary | ICD-10-CM

## 2015-11-24 NOTE — Progress Notes (Signed)
S:    Patient arrives in good spirits.    Presents to the clinic for hypertension evaluation. Patient was referred on 11/10/15 by Dr. Julien Nordmann.  Patient was last seen by Primary Care Provider on 11/10/15.   Patient reports adherence with medications. He reports that he is having a headache from taking all of the medications.   Current BP Medications include:  Amlodipine 10 mg daily, furosemide 40 mg BID, hydralazine 100 mg TID, isosorbide 60 mg daily, labetalol 200 mg BID, and lisinopril 20 mg daily.   Dietary habits include: patient morbidly obese and has been told in the past to lose weight and cut down on salt intake.   Patient denies SOB, chest pain, nausea/vomiting, numbness, weakness, blurry vision. Only complaint patient has is occasional headache that comes and goes. He reports that it is not severe and that he cannot figure out which medication is causing it. He also complains that his peripheral edema is starting to return but it is not as bad as it was when he went to the hospital.   O:   Last 3 Office BP readings: BP Readings from Last 3 Encounters:  11/24/15 150/90  11/10/15 214/136  11/07/15 136/84    BMET    Component Value Date/Time   NA 138 11/07/2015 0711   K 4.1 11/07/2015 0711   CL 104 11/07/2015 0711   CO2 24 11/07/2015 0711   GLUCOSE 98 11/07/2015 0711   BUN 17 11/07/2015 0711   CREATININE 1.25* 11/07/2015 0711   CALCIUM 9.1 11/07/2015 0711   GFRNONAA >60 11/07/2015 0711   GFRAA >60 11/07/2015 0711    A/P: Hypertension longstanding currently uncontrolled on current medications.  Continued all medications as prescribed - patient to follow up with cardiology tomorrow and I will let them adjust his medications for blood pressure and peripheral edema. Headache possibly from a blood pressure medication or from lowering his blood pressure after it has been so high. Patient will follow up with Dr. Julien Nordmann concerning headache.   Results reviewed and written  information provided.   Total time in face-to-face counseling 20 minutes.   F/U Clinic Visit with Dr. Julien Nordmann in 2 weeks.

## 2015-11-24 NOTE — Patient Instructions (Addendum)
Thanks for coming to see me!  Continue all of your medications as prescribed and go to the cardiology appointment tomorrow  Follow up with Dr. Julien Nordmann in 2 weeks.  DASH Eating Plan DASH stands for "Dietary Approaches to Stop Hypertension." The DASH eating plan is a healthy eating plan that has been shown to reduce high blood pressure (hypertension). Additional health benefits may include reducing the risk of type 2 diabetes mellitus, heart disease, and stroke. The DASH eating plan may also help with weight loss. WHAT DO I NEED TO KNOW ABOUT THE DASH EATING PLAN? For the DASH eating plan, you will follow these general guidelines:  Choose foods with a percent daily value for sodium of less than 5% (as listed on the food label).  Use salt-free seasonings or herbs instead of table salt or sea salt.  Check with your health care provider or pharmacist before using salt substitutes.  Eat lower-sodium products, often labeled as "lower sodium" or "no salt added."  Eat fresh foods.  Eat more vegetables, fruits, and low-fat dairy products.  Choose whole grains. Look for the word "whole" as the first word in the ingredient list.  Choose fish and skinless chicken or [place] more often than red meat. Limit fish, poultry, and meat to 6 oz (170 g) each day.  Limit sweets, desserts, sugars, and sugary drinks.  Choose heart-healthy fats.  Limit cheese to 1 oz (28 g) per day.  Eat more home-cooked food and less restaurant, buffet, and fast food.  Limit fried foods.  Cook foods using methods other than frying.  Limit canned vegetables. If you do use them, rinse them well to decrease the sodium.  When eating at a restaurant, ask that your food be prepared with less salt, or no salt if possible. WHAT FOODS CAN I EAT? Seek help from a dietitian for individual calorie needs. Grains Whole grain or whole wheat bread. Brown rice. Whole grain or whole wheat pasta. Quinoa, bulgur, and whole grain  cereals. Low-sodium cereals. Corn or whole wheat flour tortillas. Whole grain cornbread. Whole grain crackers. Low-sodium crackers. Vegetables Fresh or frozen vegetables (raw, steamed, roasted, or grilled). Low-sodium or reduced-sodium tomato and vegetable juices. Low-sodium or reduced-sodium tomato sauce and paste. Low-sodium or reduced-sodium canned vegetables.  Fruits All fresh, canned (in natural juice), or frozen fruits. Meat and Other Protein Products Ground beef (85% or leaner), grass-fed beef, or beef trimmed of fat. Skinless chicken or [place]. Ground chicken or [place]. Pork trimmed of fat. All fish and seafood. Eggs. Dried beans, peas, or lentils. Unsalted nuts and seeds. Unsalted canned beans. Dairy Low-fat dairy products, such as skim or 1% milk, 2% or reduced-fat cheeses, low-fat ricotta or cottage cheese, or plain low-fat yogurt. Low-sodium or reduced-sodium cheeses. Fats and Oils Tub margarines without trans fats. Light or reduced-fat mayonnaise and salad dressings (reduced sodium). Avocado. Safflower, olive, or canola oils. Natural peanut or almond butter. Other Unsalted popcorn and pretzels. The items listed above may not be a complete list of recommended foods or beverages. Contact your dietitian for more options. WHAT FOODS ARE NOT RECOMMENDED? Grains White bread. White pasta. White rice. Refined cornbread. Bagels and croissants. Crackers that contain trans fat. Vegetables Creamed or fried vegetables. Vegetables in a cheese sauce. Regular canned vegetables. Regular canned tomato sauce and paste. Regular tomato and vegetable juices. Fruits Dried fruits. Canned fruit in light or heavy syrup. Fruit juice. Meat and Other Protein Products Fatty cuts of meat. Ribs, chicken wings, bacon, sausage, bologna, salami, chitterlings,  fatback, hot dogs, bratwurst, and packaged luncheon meats. Salted nuts and seeds. Canned beans with salt. Dairy Whole or 2% milk, cream, half-and-half, and  cream cheese. Whole-fat or sweetened yogurt. Full-fat cheeses or blue cheese. Nondairy creamers and whipped toppings. Processed cheese, cheese spreads, or cheese curds. Condiments Onion and garlic salt, seasoned salt, table salt, and sea salt. Canned and packaged gravies. Worcestershire sauce. Tartar sauce. Barbecue sauce. Teriyaki sauce. Soy sauce, including reduced sodium. Steak sauce. Fish sauce. Oyster sauce. Cocktail sauce. Horseradish. Ketchup and mustard. Meat flavorings and tenderizers. Bouillon cubes. Hot sauce. Tabasco sauce. Marinades. Taco seasonings. Relishes. Fats and Oils Butter, stick margarine, lard, shortening, ghee, and bacon fat. Coconut, palm kernel, or palm oils. Regular salad dressings. Other Pickles and olives. Salted popcorn and pretzels. The items listed above may not be a complete list of foods and beverages to avoid. Contact your dietitian for more information. WHERE CAN I FIND MORE INFORMATION? National Heart, Lung, and Blood Institute: [employer]   This information is not intended to replace advice given to you by your health care provider. Make sure you discuss any questions you have with your health care provider.   Document Released: 07/28/2011 Document Revised: 08/29/2014 Document Reviewed: 06/12/2013 Elsevier Interactive Patient Education 2016 [employer].  Hypertension Hypertension is another name for high blood pressure. High blood pressure forces your heart to work harder to pump blood. A blood pressure reading has two numbers, which includes a higher number over a lower number (example: 110/72). HOME CARE   Have your blood pressure rechecked by your doctor.  Only take medicine as told by your doctor. Follow the directions carefully. The medicine does not work as well if you skip doses. Skipping doses also puts you at risk for problems.  Do not smoke.  Monitor your blood pressure at home as told by your  doctor. GET HELP IF:  You think you are having a reaction to the medicine you are taking.  You have repeat headaches or feel dizzy.  You have puffiness (swelling) in your ankles.  You have trouble with your vision. GET HELP RIGHT AWAY IF:   You get a very bad headache and are confused.  You feel weak, numb, or faint.  You get chest or belly (abdominal) pain.  You throw up (vomit).  You cannot breathe very well. MAKE SURE YOU:   Understand these instructions.  Will watch your condition.  Will get help right away if you are not doing well or get worse.   This information is not intended to replace advice given to you by your health care provider. Make sure you discuss any questions you have with your health care provider.   Document Released: 01/25/2008 Document Revised: 08/13/2013 Document Reviewed: 05/31/2013 Elsevier Interactive Patient Education [employer]

## 2015-11-25 ENCOUNTER — Ambulatory Visit (INDEPENDENT_AMBULATORY_CARE_PROVIDER_SITE_OTHER): Payer: Self-pay | Admitting: Physician Assistant

## 2015-11-25 ENCOUNTER — Encounter: Payer: Self-pay | Admitting: Physician Assistant

## 2015-11-25 ENCOUNTER — Other Ambulatory Visit (INDEPENDENT_AMBULATORY_CARE_PROVIDER_SITE_OTHER): Payer: Self-pay | Admitting: *Deleted

## 2015-11-25 VITALS — BP 138/90 | HR 88 | Ht 72.0 in | Wt 386.0 lb

## 2015-11-25 DIAGNOSIS — I119 Hypertensive heart disease without heart failure: Secondary | ICD-10-CM

## 2015-11-25 DIAGNOSIS — R600 Localized edema: Secondary | ICD-10-CM

## 2015-11-25 DIAGNOSIS — R609 Edema, unspecified: Secondary | ICD-10-CM

## 2015-11-25 DIAGNOSIS — I10 Essential (primary) hypertension: Secondary | ICD-10-CM

## 2015-11-25 DIAGNOSIS — I5033 Acute on chronic diastolic (congestive) heart failure: Secondary | ICD-10-CM

## 2015-11-25 LAB — BASIC METABOLIC PANEL
BUN: 10 mg/dL (ref 7–25)
CHLORIDE: 106 mmol/L (ref 98–110)
CO2: 27 mmol/L (ref 20–31)
CREATININE: 1.05 mg/dL (ref 0.60–1.35)
Calcium: 9.9 mg/dL (ref 8.6–10.3)
Glucose, Bld: 87 mg/dL (ref 65–99)
POTASSIUM: 4 mmol/L (ref 3.5–5.3)
Sodium: 142 mmol/L (ref 135–146)

## 2015-11-25 NOTE — Patient Instructions (Signed)
Medication Instructions:  INCREASE Lasix to 80mg  in the a.m.and 40mg  in the p.m.for 2 days. Then RESUME 40mg  twice daily.  Labwork: Bmet today  Testing/Procedures: None ordered  Follow-Up: Your physician recommends that you schedule a follow-up appointment in: 1 month with an PA/NP  Your physician recommends that you schedule a follow-up appointment in: 3 months with Dr.Berry    Any Other Special Instructions Will Be Listed Below (If Applicable). Please purchase a scale Your physician recommends that you weigh, daily, at the same time every day, and in the same amount of clothing. Please record your daily weights. Call the office if your weight is up 3lbs in a day 5lbs in a week  Your physician has requested that you  monitor and record your blood pressure readings at home every other day for the next couple of weeks. Please use the same machine at the same time of day to check your readings and record them to bring to your follow-up visit. Your blood pressure goal is <130/80.  Please limit sodium in your diet to less that 2 grams daily   Low-Sodium Eating Plan Sodium raises blood pressure and causes water to be held in the body. Getting less sodium from food will help lower your blood pressure, reduce any swelling, and protect your heart, liver, and kidneys. We get sodium by adding salt (sodium chloride) to food. Most of our sodium comes from canned, boxed, and frozen foods. Restaurant foods, fast foods, and pizza are also very high in sodium. Even if you take medicine to lower your blood pressure or to reduce fluid in your body, getting less sodium from your food is important. WHAT IS MY PLAN? Most people should limit their sodium intake to 2,300 mg a day. Your health care provider recommends that you limit your sodium intake to ____2,000 mg______ a day.  WHAT DO I NEED TO KNOW ABOUT THIS EATING PLAN? For the low-sodium eating plan, you will follow these general guidelines:  Choose  foods with a % Daily Value for sodium of less than 5% (as listed on the food label).   Use salt-free seasonings or herbs instead of table salt or sea salt.   Check with your health care provider or pharmacist before using salt substitutes.   Eat fresh foods.  Eat more vegetables and fruits.  Limit canned vegetables. If you do use them, rinse them well to decrease the sodium.   Limit cheese to 1 oz (28 g) per day.   Eat lower-sodium products, often labeled as "lower sodium" or "no salt added."  Avoid foods that contain monosodium glutamate (MSG). MSG is sometimes added to [place] food and some canned foods.  Check food labels (Nutrition Facts labels) on foods to learn how much sodium is in one serving.  Eat more home-cooked food and less restaurant, buffet, and fast food.  When eating at a restaurant, ask that your food be prepared with less salt, or no salt if possible.  HOW DO I READ FOOD LABELS FOR SODIUM INFORMATION? The Nutrition Facts label lists the amount of sodium in one serving of the food. If you eat more than one serving, you must multiply the listed amount of sodium by the number of servings. Food labels may also identify foods as:  Sodium free--Less than 5 mg in a serving.  Very low sodium--35 mg or less in a serving.  Low sodium--140 mg or less in a serving.  Light in sodium--50% less sodium in a serving. For example,  if a food that usually has 300 mg of sodium is changed to become light in sodium, it will have 150 mg of sodium.  Reduced sodium--25% less sodium in a serving. For example, if a food that usually has 400 mg of sodium is changed to reduced sodium, it will have 300 mg of sodium. WHAT FOODS CAN I EAT? Grains Low-sodium cereals, including oats, puffed wheat and rice, and shredded wheat cereals. Low-sodium crackers. Unsalted rice and pasta. Lower-sodium bread.  Vegetables Frozen or fresh vegetables. Low-sodium or reduced-sodium canned  vegetables. Low-sodium or reduced-sodium tomato sauce and paste. Low-sodium or reduced-sodium tomato and vegetable juices.  Fruits Fresh, frozen, and canned fruit. Fruit juice.  Meat and Other Protein Products Low-sodium canned tuna and salmon. Fresh or frozen meat, poultry, seafood, and fish. Lamb. Unsalted nuts. Dried beans, peas, and lentils without added salt. Unsalted canned beans. Homemade soups without salt. Eggs.  Dairy Milk. Soy milk. Ricotta cheese. Low-sodium or reduced-sodium cheeses. Yogurt.  Condiments Fresh and dried herbs and spices. Salt-free seasonings. Onion and garlic powders. Low-sodium varieties of mustard and ketchup. Fresh or refrigerated horseradish. Lemon juice.  Fats and Oils Reduced-sodium salad dressings. Unsalted butter.  Other Unsalted popcorn and pretzels.  The items listed above may not be a complete list of recommended foods or beverages. Contact your dietitian for more options. WHAT FOODS ARE NOT RECOMMENDED? Grains Instant hot cereals. Bread stuffing, pancake, and biscuit mixes. Croutons. Seasoned rice or pasta mixes. Noodle soup cups. Boxed or frozen macaroni and cheese. Self-rising flour. Regular salted crackers. Vegetables Regular canned vegetables. Regular canned tomato sauce and paste. Regular tomato and vegetable juices. Frozen vegetables in sauces. Salted [place] fries. Olives. Rosita Fire. Relishes. Sauerkraut. Salsa. Meat and Other Protein Products Salted, canned, smoked, spiced, or pickled meats, seafood, or fish. Bacon, ham, sausage, hot dogs, corned beef, chipped beef, and packaged luncheon meats. Salt pork. Jerky. Pickled herring. Anchovies, regular canned tuna, and sardines. Salted nuts. Dairy Processed cheese and cheese spreads. Cheese curds. Blue cheese and cottage cheese. Buttermilk.  Condiments Onion and garlic salt, seasoned salt, table salt, and sea salt. Canned and packaged gravies. Worcestershire sauce. Tartar sauce. Barbecue  sauce. Teriyaki sauce. Soy sauce, including reduced sodium. Steak sauce. Fish sauce. Oyster sauce. Cocktail sauce. Horseradish that you find on the shelf. Regular ketchup and mustard. Meat flavorings and tenderizers. Bouillon cubes. Hot sauce. Tabasco sauce. Marinades. Taco seasonings. Relishes. Fats and Oils Regular salad dressings. Salted butter. Margarine. Ghee. Bacon fat.  Other Potato and tortilla chips. Corn chips and puffs. Salted popcorn and pretzels. Canned or dried soups. Pizza. Frozen entrees and pot pies.  The items listed above may not be a complete list of foods and beverages to avoid. Contact your dietitian for more information.   This information is not intended to replace advice given to you by your health care provider. Make sure you discuss any questions you have with your health care provider.   Document Released: 01/28/2002 Document Revised: 08/29/2014 Document Reviewed: 06/12/2013 Elsevier Interactive Patient Education [employer]           If you need a refill on your cardiac medications before your next appointment, please call your pharmacy.

## 2015-11-25 NOTE — Progress Notes (Signed)
Patient ID: Paul Lamb, male   DOB: [birth year], 30 y.o.   MRN: [id number]    Date:  11/25/2015   ID:  Paul Lamb, DOB 11/24/1985, MRN [id number]  PCP:  Pete Glatter, MD  Primary Cardiologist:  Allyson Sabal  Chief Complaint  Patient presents with  . Follow-up    swelling in legs and ankles     History of Present Illness: BEE HEFEL is a 30 y.o. morbidly obese male with a history of hypertension. He was admitted last month with hypertensive emergency and chest tightness along with pulmonary edema. He was placed on beta blocker, Norvasc, Imdur and ACE inhibitor. He was diuresed with IV Lasix and switched to 40 mg by mouth twice daily. As an outpatient sleep study scheduled. He had an elevated d-dimer and CT angiogram of the chest was unremarkable.  He had an echocardiogram 11/04/2015 which revealed an ejection fraction of 55-60% with normal wall motion. He has severe concentric hypertrophy. Peak PA pressure 44 mmHg. Small pericardial effusion. No tamponade.  Patient presents for posthospital follow-up. His discharge weight is 379 pounds.  The patient currently denies nausea, vomiting, fever, chest pain, shortness of breath, orthopnea, dizziness, PND, cough, congestion, abdominal pain, hematochezia, melena, lower extremity edema, claudication.  Wt Readings from Last 3 Encounters:  11/25/15 386 lb (175.088 kg)  11/10/15 397 lb (180.078 kg)  11/05/15 379 lb 9.6 oz (172.185 kg)     Past Medical History  Diagnosis Date  . Hypertension     Current Outpatient Prescriptions  Medication Sig Dispense Refill  . amLODipine (NORVASC) 10 MG tablet Take 1 tablet (10 mg total) by mouth daily. 30 tablet 2  . furosemide (LASIX) 40 MG tablet Take 1 tablet (40 mg total) by mouth 2 (two) times daily. 30 tablet 0  . hydrALAZINE (APRESOLINE) 100 MG tablet Take 1 tablet (100 mg total) by mouth every 8 (eight) hours. 90 tablet 2  . isosorbide mononitrate (IMDUR) 60 MG 24 hr tablet Take 1 tablet  (60 mg total) by mouth daily. 90 tablet 2  . labetalol (NORMODYNE) 200 MG tablet Take 1 tablet (200 mg total) by mouth 2 (two) times daily. 60 tablet 2  . lisinopril (PRINIVIL,ZESTRIL) 20 MG tablet Take 1 tablet (20 mg total) by mouth daily. 90 tablet 2   No current facility-administered medications for this visit.    Allergies:   No Known Allergies  Social History:  The patient  reports that he has never smoked. He has never used smokeless tobacco. He reports that he does not drink alcohol or use illicit drugs.   Family history:   Family History  Problem Relation Age of Onset  . Hypertension    . Diabetes Mellitus II      ROS:  Please see the history of present illness.  All other systems reviewed and negative.   PHYSICAL EXAM: VS:  BP 138/90 mmHg  Pulse 88  Ht 6' (1.829 m)  Wt 386 lb (175.088 kg)  BMI 52.34 kg/m2 Morbidly obese, well developed, in no acute distress HEENT: Pupils are equal round react to light accommodation extraocular movements are intact.  Neck: no JVDNo cervical lymphadenopathy. Cardiac: Regular rate and rhythm without murmurs rubs or gallops. Lungs:  clear to auscultation bilaterally, no wheezing, rhonchi or rales Abd: soft, nontender, positive bowel sounds all quadrants Ext: 2+ lower extremity edema mid shin down.  2+ radial and dorsalis pedis pulses. Skin: warm and dry Neuro:  Grossly normal    ASSESSMENT AND  PLAN:  Problem List Items Addressed This Visit    Peripheral edema   Morbid obesity (HCC)   Cardiomegaly - hypertensive    Other Visit Diagnoses    Essential hypertension    -  Primary    Relevant Orders    Basic metabolic panel       Mr. Shirer appears to be doing well. He has regained some lower extremity edema that was not there at his discharge. He does not have a scale but I asked him to get one..  We discussed at length low-sodium and carbohydrate diet.  His blood pressure is much better than it was in the hospital. He is on quite  a few medications including Norvasc 10 mg, hydralazine 100 every 8 hours, Imdur 60 mg, labetalol 200 twice a day and lisinopril 20 mg daily. I asked him to get a blood pressure cuff monitor his blood pressure at least every other day. His target should be under 130/80, which is not far off at this point he has a sleep study scheduled.   As far as the increased lower extremity edema vascular take an extra 40 mg of Lasix tomorrow morning in the next then resume 40 twice a day. We'll check a basic metabolic panel today. I'll have him come back in one month to reassess and in 3 months with Dr. Allyson Sabal.

## 2015-12-14 ENCOUNTER — Ambulatory Visit: Payer: Self-pay | Admitting: Internal Medicine

## 2015-12-21 ENCOUNTER — Encounter: Payer: Self-pay | Admitting: Internal Medicine

## 2015-12-21 ENCOUNTER — Ambulatory Visit: Payer: Self-pay | Admitting: Internal Medicine

## 2015-12-21 ENCOUNTER — Ambulatory Visit: Payer: Self-pay | Admitting: Physician Assistant

## 2015-12-21 ENCOUNTER — Ambulatory Visit: Payer: Self-pay | Attending: Internal Medicine | Admitting: Internal Medicine

## 2015-12-21 ENCOUNTER — Other Ambulatory Visit: Payer: Self-pay | Admitting: Internal Medicine

## 2015-12-21 DIAGNOSIS — R0602 Shortness of breath: Secondary | ICD-10-CM | POA: Insufficient documentation

## 2015-12-21 DIAGNOSIS — I10 Essential (primary) hypertension: Secondary | ICD-10-CM | POA: Insufficient documentation

## 2015-12-21 DIAGNOSIS — Z79899 Other long term (current) drug therapy: Secondary | ICD-10-CM | POA: Insufficient documentation

## 2015-12-21 DIAGNOSIS — R600 Localized edema: Secondary | ICD-10-CM | POA: Insufficient documentation

## 2015-12-21 LAB — POCT GLYCOSYLATED HEMOGLOBIN (HGB A1C): Hemoglobin A1C: 6

## 2015-12-21 MED ORDER — ISOSORBIDE MONONITRATE ER 60 MG PO TB24: 60.0000 mg | ORAL_TABLET | Freq: Every day | ORAL | Status: DC

## 2015-12-21 MED ORDER — AMLODIPINE BESYLATE 10 MG PO TABS: 10.0000 mg | ORAL_TABLET | Freq: Every day | ORAL | Status: DC

## 2015-12-21 MED ORDER — LISINOPRIL 20 MG PO TABS: 20.0000 mg | ORAL_TABLET | Freq: Every day | ORAL | Status: DC

## 2015-12-21 MED ORDER — FUROSEMIDE 40 MG PO TABS: 40.0000 mg | ORAL_TABLET | Freq: Two times a day (BID) | ORAL | Status: DC

## 2015-12-21 MED FILL — LABETALOL HCL 200 MG TABLET: 200 | 30 days supply | Qty: 60 | Fill #1

## 2015-12-21 MED FILL — FUROSEMIDE 40 MG TABLET: 40 | 15 days supply | Qty: 30 | Fill #0

## 2015-12-21 MED FILL — AMLODIPINE BESYLATE 10 MG T: 10 | 30 days supply | Qty: 30 | Fill #1

## 2015-12-21 MED FILL — ISOSORBIDE MN ER 60 MG TAB: 60 | 30 days supply | Qty: 30 | Fill #1

## 2015-12-21 MED FILL — ?LISINOPRIL 20 MG TABLET: 20 | 30 days supply | Qty: 30 | Fill #1

## 2015-12-21 NOTE — Progress Notes (Signed)
Patient is here for FU HTN  Patient denies pain at this time.  Patient has taken medication and patient has eaten today.  Patient is requesting refills on all medications except for Hydralazine.

## 2015-12-21 NOTE — Progress Notes (Signed)
Paul Lamb, is a 30 y.o. male  WNU:[id number]  IHK:[id number]  DOB - Oct 17, 1985  Chief Complaint  Patient presents with  . Follow-up    HTN        Subjective:   Paul Lamb is a 30 y.o. male here today for a follow up visit for htn.  Doing pretty well on current med regimen. He admits to forgetting to take that 3rd dose of hydralazine sometimes due to work, but otherwise is doing well with other meds.  Denies cp. Had some increasing sob/doe on Saturday, but since resolved.  States he ran out of his lasix about 1 wk ago, and noticed his legs swelling up again.  He states he tries to watch his diet, but having hard time w/ potato chips currently.  Denies orthopnea/pnd.  Has pending sleep study on 01/07/16.   Patient has No headache, No chest pain, No abdominal pain - No Nausea, No new weakness tingling or numbness, No Cough - SOB.  No problems updated.  ALLERGIES: No Known Allergies  PAST MEDICAL HISTORY: Past Medical History  Diagnosis Date  . Hypertension     MEDICATIONS AT HOME: Prior to Admission medications   Medication Sig Start Date End Date Taking? Authorizing Provider  amLODipine (NORVASC) 10 MG tablet Take 1 tablet (10 mg total) by mouth daily. 12/21/15  Yes Pete Glatter, MD  furosemide (LASIX) 40 MG tablet Take 1 tablet (40 mg total) by mouth 2 (two) times daily. 12/21/15  Yes Pete Glatter, MD  hydrALAZINE (APRESOLINE) 100 MG tablet Take 1 tablet (100 mg total) by mouth every 8 (eight) hours. 11/10/15  Yes Pete Glatter, MD  isosorbide mononitrate (IMDUR) 60 MG 24 hr tablet Take 1 tablet (60 mg total) by mouth daily. 12/21/15  Yes Pete Glatter, MD  labetalol (NORMODYNE) 200 MG tablet Take 1 tablet (200 mg total) by mouth 2 (two) times daily. 11/10/15  Yes Pete Glatter, MD  lisinopril (PRINIVIL,ZESTRIL) 20 MG tablet Take 1 tablet (20 mg total) by mouth daily. 12/21/15  Yes Pete Glatter, MD     Objective:   Filed Vitals:   12/21/15 1442  BP:  156/83  Pulse: 98  Temp: 98.8 F (37.1 C)  TempSrc: Oral  Resp: 18  Height: 6\' 2"  (1.88 m)  Weight: 400 lb (181.439 kg)  SpO2: 96%    Exam General appearance : Awake, alert, not in any distress. Speech Clear. Not toxic looking, pleasant. aaox 3.  Morbid obese. HEENT: Atraumatic and Normocephalic, pupils equally reactive to light. Neck: supple, no JVD. No cervical lymphadenopathy.  Chest:Good air entry bilaterally, no added sounds. CVS: S1 S2 regular, no murmurs/gallups or rubs. Abdomen: Bowel sounds active, Non tender and not distended with no gaurding, rigidity or rebound. Extremities: B/L Lower Ext shows 3+ pitting edema, both legs are warm to touch Neurology: Awake alert, and oriented X 3, CN II-XII grossly intact, Non focal Skin:No Rash  Data Review No results found for: HGBA1C  Depression screen [facility] 2/9 12/21/2015 11/10/2015  Decreased Interest 0 0  Down, Depressed, Hopeless 0 0  PHQ - 2 Score 0 0    FINDINGS: 11/01/15 CTA CHEST FINDINGS  Mediastinum/Nodes: There is cardiomegaly. Small pericardial effusion. Aorta is normal caliber. No dissection. No filling defects in the pulmonary arteries to suggest pulmonary emboli. No mediastinal, hilar, or axillary adenopathy.  Lungs/Pleura: Mild hazy opacities within the lungs could reflect early edema. Tree-in-bud densities are noted in the inferior right upper lobe and  within the right lower lobe concerning for early bronchopneumonia. No confluent opacity on the left. No effusions.  Musculoskeletal: Chest wall soft tissues are unremarkable. No acute bony abnormality.  Review of the MIP images confirms the above findings.  CTA ABDOMEN AND PELVIS FINDINGS  Hepatobiliary: Unremarkable appearance. Gallbladder unremarkable.  Pancreas: Unremarkable appearance  Spleen: Unremarkable appearance  Adrenals/Urinary Tract: No adrenal abnormality. No focal renal abnormality. No stones or hydronephrosis. Urinary bladder  is unremarkable.  Stomach/Bowel: Normal appendix. Stomach, large and small bowel grossly unremarkable.  Vascular/Lymphatic: No evidence of aneurysm or adenopathy. No evidence of dissection. Mesenteric vessels and single renal arteries are widely patent bilaterally.  Reproductive: No abnormality.  Other: No free fluid or free air.  Musculoskeletal: No acute bony abnormality.  Review of the MIP images confirms the above findings.  IMPRESSION: No evidence of pulmonary embolus. No evidence of aortic aneurysm or dissection.  Cardiomegaly, small pericardial effusion.  Mild ground-glass opacities within the lungs could reflect early edema.  Or focal tree-in-bud densities within the inferior right upper lobe and adjacent right lower lobe. This could reflect early bronchopneumonia.  No acute findings in the abdomen or pelvis.  Assessment & Plan   1  htn uncontrolled - pt admits to dietary discretion (chips) and missing hydralazine sometimes due to work. - dw pt low salt diet, reasons for recommendations, etc. - eval of prior CTA showed no renal stenosis - per pt, meds working and he is taking most, will work on salt lowering diet, but would like to keep same regimen. - suspect once resumes back on lasix, bp will improve as well.  2. bilat le edema - no signs of orthopnea/jvd/pulm edema - will reorder lasix 40bid, take extra dose next 2 days. - on acei as well, has not had to be on kreplacement prior. - low salt diet again emphasized today.  3. Morbid obesity, unspecified obesity type (HCC) a1c 6.0, prediabetes, with extreme morbid obesity - diet/exercise discussed, but will dw pt more next appt on metformin  4. SOB (shortness of breath), transient on Sat. - lasix renewed.     Patient have been counseled extensively about nutrition and exercise. Spent w/ patient in counseling and review care plan, records.  Return in about 6 weeks (around  02/01/2016), or if symptoms worsen or fail to improve.  The patient was given clear instructions to go to ER or return to medical center if symptoms don't improve, worsen or new problems develop. The patient verbalized understanding. The patient was told to call to get lab results if they haven't heard anything in the next week.    Pete Glatter, MD, MBA/MHA [facility] [place], [place] [phone]   12/21/2015, 2:47 PM

## 2015-12-21 NOTE — Patient Instructions (Addendum)
- take extra dose of lasix next 2 days, than go back to 2x day.  If worsening shortness of  Breath, please call us.  CP go to ER.  Low-Sodium Eating Plan Sodium raises blood pressure and causes water to be held in the body. Getting less sodium from food will help lower your blood pressure, reduce any swelling, and protect your heart, liver, and kidneys. We get sodium by adding salt (sodium chloride) to food. Most of our sodium comes from canned, boxed, and frozen foods. Restaurant foods, fast foods, and pizza are also very high in sodium. Even if you take medicine to lower your blood pressure or to reduce fluid in your body, getting less sodium from your food is important. WHAT IS MY PLAN? Most people should limit their sodium intake to 2,300 mg a day. Your health care provider recommends that you limit your sodium intake to __________ a day.  WHAT DO I NEED TO KNOW ABOUT THIS EATING PLAN? For the low-sodium eating plan, you will follow these general guidelines: 1. Choose foods with a % Daily Value for sodium of less than 5% (as listed on the food label).  2. Use salt-free seasonings or herbs instead of table salt or sea salt.  3. Check with your health care provider or pharmacist before using salt substitutes.  4. Eat fresh foods. 5. Eat more vegetables and fruits. 6. Limit canned vegetables. If you do use them, rinse them well to decrease the sodium.  7. Limit cheese to 1 oz (28 g) per day.  8. Eat lower-sodium products, often labeled as "lower sodium" or "no salt added." 9. Avoid foods that contain monosodium glutamate (MSG). MSG is sometimes added to [place] food and some canned foods. 10. Check food labels (Nutrition Facts labels) on foods to learn how much sodium is in one serving. 11. Eat more home-cooked food and less restaurant, buffet, and fast food. 12. When eating at a restaurant, ask that your food be prepared with less salt, or no salt if possible.  HOW DO I READ FOOD  LABELS FOR SODIUM INFORMATION? The Nutrition Facts label lists the amount of sodium in one serving of the food. If you eat more than one serving, you must multiply the listed amount of sodium by the number of servings. Food labels may also identify foods as: 1. Sodium free--Less than 5 mg in a serving. 2. Very low sodium--35 mg or less in a serving. 3. Low sodium--140 mg or less in a serving. 4. Light in sodium--50% less sodium in a serving. For example, if a food that usually has 300 mg of sodium is changed to become light in sodium, it will have 150 mg of sodium. 5. Reduced sodium--25% less sodium in a serving. For example, if a food that usually has 400 mg of sodium is changed to reduced sodium, it will have 300 mg of sodium. WHAT FOODS CAN I EAT? Grains Low-sodium cereals, including oats, puffed wheat and rice, and shredded wheat cereals. Low-sodium crackers. Unsalted rice and pasta. Lower-sodium bread.  Vegetables Frozen or fresh vegetables. Low-sodium or reduced-sodium canned vegetables. Low-sodium or reduced-sodium tomato sauce and paste. Low-sodium or reduced-sodium tomato and vegetable juices.  Fruits Fresh, frozen, and canned fruit. Fruit juice.  Meat and Other Protein Products Low-sodium canned tuna and salmon. Fresh or frozen meat, poultry, seafood, and fish. Lamb. Unsalted nuts. Dried beans, peas, and lentils without added salt. Unsalted canned beans. Homemade soups without salt. Eggs.  Dairy Milk. Soy milk. Ricotta  cheese. Low-sodium or reduced-sodium cheeses. Yogurt.  Condiments Fresh and dried herbs and spices. Salt-free seasonings. Onion and garlic powders. Low-sodium varieties of mustard and ketchup. Fresh or refrigerated horseradish. Lemon juice.  Fats and Oils Reduced-sodium salad dressings. Unsalted butter.  Other Unsalted popcorn and pretzels.  The items listed above may not be a complete list of recommended foods or beverages. Contact your dietitian for more  options. WHAT FOODS ARE NOT RECOMMENDED? Grains Instant hot cereals. Bread stuffing, pancake, and biscuit mixes. Croutons. Seasoned rice or pasta mixes. Noodle soup cups. Boxed or frozen macaroni and cheese. Self-rising flour. Regular salted crackers. Vegetables Regular canned vegetables. Regular canned tomato sauce and paste. Regular tomato and vegetable juices. Frozen vegetables in sauces. Salted [place] fries. Olives. Rosita Fire. Relishes. Sauerkraut. Salsa. Meat and Other Protein Products Salted, canned, smoked, spiced, or pickled meats, seafood, or fish. Bacon, ham, sausage, hot dogs, corned beef, chipped beef, and packaged luncheon meats. Salt pork. Jerky. Pickled herring. Anchovies, regular canned tuna, and sardines. Salted nuts. Dairy Processed cheese and cheese spreads. Cheese curds. Blue cheese and cottage cheese. Buttermilk.  Condiments Onion and garlic salt, seasoned salt, table salt, and sea salt. Canned and packaged gravies. Worcestershire sauce. Tartar sauce. Barbecue sauce. Teriyaki sauce. Soy sauce, including reduced sodium. Steak sauce. Fish sauce. Oyster sauce. Cocktail sauce. Horseradish that you find on the shelf. Regular ketchup and mustard. Meat flavorings and tenderizers. Bouillon cubes. Hot sauce. Tabasco sauce. Marinades. Taco seasonings. Relishes. Fats and Oils Regular salad dressings. Salted butter. Margarine. Ghee. Bacon fat.  Other Potato and tortilla chips. Corn chips and puffs. Salted popcorn and pretzels. Canned or dried soups. Pizza. Frozen entrees and pot pies.  The items listed above may not be a complete list of foods and beverages to avoid. Contact your dietitian for more information.   This information is not intended to replace advice given to you by your health care provider. Make sure you discuss any questions you have with your health care provider.   Document Released: 01/28/2002 Document Revised: 08/29/2014 Document Reviewed: 06/12/2013 Elsevier  Interactive Patient Education 2016 [employer].  - American Heart Association (AHA) Exercise Recommendation  Being physically active is important to prevent heart disease and stroke, the nation's No. 1and No. 5killers. To improve overall cardiovascular health, we suggest at least 150 minutes per week of moderate exercise or 75 minutes per week of vigorous exercise (or a combination of moderate and vigorous activity). Thirty minutes a day, five times a week is an easy goal to remember. You will also experience benefits even if you divide your time into two or three segments of 10 to 15 minutes per day.  For people who would benefit from lowering their blood pressure or cholesterol, we recommend 40 minutes of aerobic exercise of moderate to vigorous intensity three to four times a week to lower the risk for heart attack and stroke.  Physical activity is anything that makes you move your body and burn calories.  This includes things like climbing stairs or playing sports. Aerobic exercises benefit your heart, and include walking, jogging, swimming or biking. Strength and stretching exercises are best for overall stamina and flexibility.  The simplest, positive change you can make to effectively improve your heart health is to start walking. It's enjoyable, free, easy, social and great exercise. A walking program is flexible and boasts high success rates because people can stick with it. It's easy for walking to become a regular and satisfying part of life.   For Overall  Cardiovascular Health:  At least 30 minutes of moderate-intensity aerobic activity at least 5 days per week for a total of 150  OR   At least 25 minutes of vigorous aerobic activity at least 3 days per week for a total of 75 minutes; or a combination of moderate- and vigorous-intensity aerobic activity  AND   Moderate- to high-intensity muscle-strengthening activity at least 2 days per week for additional health  benefits.  For Lowering Blood Pressure and Cholesterol  An average 40 minutes of moderate- to vigorous-intensity aerobic activity 3 or 4 times per week  What if I can't make it to the time goal? Something is always better than nothing! And everyone has to start somewhere. Even if you've been sedentary for years, today is the day you can begin to make healthy changes in your life. If you don't think you'll make it for 30 or 40 minutes, set a reachable goal for today. You can work up toward your overall goal by increasing your time as you get stronger. Don't let all-or-nothing thinking rob you of doing what you can every day.  Source:http://www.heart.org

## 2016-01-07 ENCOUNTER — Ambulatory Visit (HOSPITAL_BASED_OUTPATIENT_CLINIC_OR_DEPARTMENT_OTHER): Payer: Self-pay | Attending: Internal Medicine | Admitting: Internal Medicine

## 2016-01-07 VITALS — Ht 74.0 in | Wt 380.0 lb

## 2016-01-07 DIAGNOSIS — G4733 Obstructive sleep apnea (adult) (pediatric): Secondary | ICD-10-CM | POA: Insufficient documentation

## 2016-01-07 DIAGNOSIS — G4730 Sleep apnea, unspecified: Secondary | ICD-10-CM

## 2016-01-12 ENCOUNTER — Encounter: Payer: Self-pay | Admitting: Cardiology

## 2016-01-12 ENCOUNTER — Other Ambulatory Visit (HOSPITAL_BASED_OUTPATIENT_CLINIC_OR_DEPARTMENT_OTHER): Payer: Self-pay

## 2016-01-12 ENCOUNTER — Ambulatory Visit (INDEPENDENT_AMBULATORY_CARE_PROVIDER_SITE_OTHER): Payer: Self-pay | Admitting: Cardiology

## 2016-01-12 VITALS — BP 181/111 | HR 84 | Ht 74.0 in | Wt >= 6400 oz

## 2016-01-12 DIAGNOSIS — G4730 Sleep apnea, unspecified: Secondary | ICD-10-CM

## 2016-01-12 DIAGNOSIS — I10 Essential (primary) hypertension: Secondary | ICD-10-CM

## 2016-01-12 DIAGNOSIS — I5031 Acute diastolic (congestive) heart failure: Secondary | ICD-10-CM

## 2016-01-12 HISTORY — DX: Acute diastolic (congestive) heart failure: I50.31

## 2016-01-12 MED ORDER — METOLAZONE 2.5 MG PO TABS: ORAL_TABLET | ORAL | Status: DC

## 2016-01-12 MED FILL — metOLazone 2.5 MG TABS: 2.5 | 30 days supply | Qty: 10 | Fill #0

## 2016-01-12 MED FILL — FUROSEMIDE 40 MG TABLET: 40 | 15 days supply | Qty: 30 | Fill #1

## 2016-01-12 NOTE — Progress Notes (Signed)
01/12/2016 Paul Lamb [facility]   [birth year]  [id number]  Primary Physician Dawn Marland Mcalpine, MD Primary Cardiologist: Dr Allyson Sabal  HPI:  30 y/o morbidly obese HTN AA male admitted 11/03/15 with APE secondary to diastolic dysfunction and uncontrolled HTN. He diuresed 50 lbs. Echo showed preserved LVF. He is in the office today for follow up. Since discharge he has regained some of his wgt back and has noticed increased LE edema. He denies any orthopnea.    Current Outpatient Prescriptions  Medication Sig Dispense Refill  . amLODipine (NORVASC) 10 MG tablet Take 1 tablet (10 mg total) by mouth daily. 90 tablet 2  . furosemide (LASIX) 40 MG tablet Take 1 tablet (40 mg total) by mouth 2 (two) times daily. 30 tablet 1  . hydrALAZINE (APRESOLINE) 100 MG tablet Take 1 tablet (100 mg total) by mouth every 8 (eight) hours. 90 tablet 2  . isosorbide mononitrate (IMDUR) 60 MG 24 hr tablet Take 1 tablet (60 mg total) by mouth daily. 90 tablet 3  . labetalol (NORMODYNE) 200 MG tablet Take 1 tablet (200 mg total) by mouth 2 (two) times daily. 60 tablet 2  . lisinopril (PRINIVIL,ZESTRIL) 20 MG tablet Take 1 tablet (20 mg total) by mouth daily. 90 tablet 3  . metolazone (ZAROXOLYN) 2.5 MG tablet Take 1 tablet by mouth 30 minutes prior to morning dose of lasix on Monday, Wednesday, Friday. 10 tablet 3   No current facility-administered medications for this visit.    No Known Allergies  Social History   Social History  . Marital Status: Single    Spouse Name: N/A  . Number of Children: N/A  . Years of Education: N/A   Occupational History  . Not on file.   Social History Main Topics  . Smoking status: Never Smoker   . Smokeless tobacco: Never Used  . Alcohol Use: No  . Drug Use: No  . Sexual Activity: Not on file   Other Topics Concern  . Not on file   Social History Narrative     Review of Systems: General: negative for chills, fever, night sweats or weight changes.  Cardiovascular:  negative for chest pain, dyspnea on exertion, orthopnea, palpitations, paroxysmal nocturnal dyspnea or shortness of breath Dermatological: negative for rash Respiratory: negative for cough or wheezing Urologic: negative for hematuria Abdominal: negative for nausea, vomiting, diarrhea, bright red blood per rectum, melena, or hematemesis Neurologic: negative for visual changes, syncope, or dizziness All other systems reviewed and are otherwise negative except as noted above.    Blood pressure 181/111, pulse 84, height  (1.88 m), weight 404 lb (183.253 kg).  General appearance: alert, cooperative, no distress and morbidly obese Neck: no carotid bruit and no JVD Lungs: clear to auscultation bilaterally Heart: regular rate and rhythm Extremities: 1+ bilateral edema Neurologic: Grossly normal   ASSESSMENT AND PLAN:   Acute diastolic (congestive) heart failure (HCC) Discharged 11/05/15- wgt at discharge 379 (down 50 lbs from adm) His wgt is up to 404 today, some increased LE edema  Uncontrolled hypertension He says he just took his medications (4pm). He will need f/u B/P in a week  Morbid obesity (HCC) BMI 48  Sleep apnea Discharge on home C-pap, he reports compliance   PLAN  I suggested we add Metolazone 2.5 mg 3 x week till his wgt gets back to 380 lbs. I told him our goal was to get his wgt and B/P stabilized. Once this is done we could consider referral to consider  bariatric surgery. He needs to come back in a week for a B/P check. He admits he hasn't been compliant with low sodium diet, I"m not sure about medication compliance and C-pap  Compliance though the pt reports he has been compliant. F/U in 4 weeks- check BMP then.   Corine Shelter PA-C 01/12/2016 3:31 PM

## 2016-01-12 NOTE — Assessment & Plan Note (Signed)
Discharged 11/05/15- wgt at discharge 379 (down 50 lbs from adm) His wgt is up to 404 today, some increased LE edema

## 2016-01-12 NOTE — Assessment & Plan Note (Signed)
BMI 48 

## 2016-01-12 NOTE — Assessment & Plan Note (Signed)
Discharge on home C-pap, he reports compliance

## 2016-01-12 NOTE — Assessment & Plan Note (Signed)
He says he just took his medications (4pm). He will need f/u B/P in a week

## 2016-01-12 NOTE — Patient Instructions (Addendum)
Your physician has recommended you make the following change in your medication..  1. START metolazone 2.5mg  once daily 30 minutes prior to AM dose of lasix on Monday, Wednesday, Friday until weight is 380lbs --- once your weight reaches 380lbs, take only twice weekly --- if you weight gets to 370lbs, then stop metolazone  Your physician recommends that you schedule a follow-up appointment in: [place], [place] in 4 weeks  Your physician recommends that you schedule a follow-up appointment in ONE WEEK for a blood pressure check with a clinical pharmacist -- please take all of your medications prior to this appointment

## 2016-01-17 NOTE — Procedures (Signed)
Patient Name: Paul Lamb, Paul Lamb Date: 01/07/2016 Gender: Male D.O.B: 1986-03-17 Age (years): 29 Referring Provider: Thurnell Lose Height (inches): 74 Interpreting Physician: Baird Lyons MD, ABSM Weight (lbs): 380 RPSGT: Laren Everts BMI: 49 MRN: [id number] Neck Size: 18.50 CLINICAL INFORMATION Sleep Study Type: Split Night CPAP Indication for sleep study: Fatigue, Hypertension, Obesity, OSA, Snoring, Witnessed Apneas Epworth Sleepiness Score: 12  SLEEP STUDY TECHNIQUE As per the AASM Manual for the Scoring of Sleep and Associated Events v2.3 (April 2016) with a hypopnea requiring 4% desaturations. The channels recorded and monitored were frontal, central and occipital EEG, electrooculogram (EOG), submentalis EMG (chin), nasal and oral airflow, thoracic and abdominal wall motion, anterior tibialis EMG, snore microphone, electrocardiogram, and pulse oximetry. Continuous positive airway pressure (CPAP) was initiated when the patient met split night criteria and was titrated according to treat sleep-disordered breathing.  MEDICATIONS Medications taken by the patient : charted for review Medications administered by patient during sleep study : HYDRALAZINE, FUROSEMIDE, LABETALOL  RESPIRATORY PARAMETERS Diagnostic Total AHI (/hr): 133.6 RDI (/hr): 134.4 OA Index (/hr): 122.7 CA Index (/hr): 0.0 REM AHI (/hr): 113.6 NREM AHI (/hr): 135.4 Supine AHI (/hr): 133.6 Non-supine AHI (/hr): N/A Min O2 Sat (%): 56.00 Mean O2 (%): 80.72 Time below 88% (min): 128.2   Titration Optimal Pressure (cm): 17 AHI at Optimal Pressure (/hr): 0.0 Min O2 at Optimal Pressure (%): 91.0 Supine % at Optimal (%): 100 Sleep % at Optimal (%): 100    SLEEP ARCHITECTURE The recording time for the entire night was 412.6 minutes. During a baseline period of 167.1 minutes, the patient slept for 152.6 minutes in REM and nonREM, yielding a sleep efficiency of 91.3%. Sleep onset after lights out was 5.5  minutes with a REM latency of 144.5 minutes. The patient spent 6.55% of the night in stage N1 sleep, 85.49% in stage N2 sleep, 0.00% in stage N3 and 7.96% in REM. During the titration period of 237.4 minutes, the patient slept for 236.0 minutes in REM and nonREM, yielding a sleep efficiency of 99.4%. Sleep onset after CPAP initiation was 1.3 minutes with a REM latency of 71.5 minutes. The patient spent 3.60% of the night in stage N1 sleep, 59.11% in stage N2 sleep, 0.00% in stage N3 and 37.29% in REM.  CARDIAC DATA The 2 lead EKG demonstrated sinus rhythm. The mean heart rate was 83.79 beats per minute. Other EKG findings include: PVCs.  LEG MOVEMENT DATA The total Periodic Limb Movements of Sleep (PLMS) were 2. The PLMS index was 0.31 .  IMPRESSIONS - Severe obstructive sleep apnea occurred during the diagnostic portion of the study (AHI = 133.6/hour). An optimal PAP pressure was selected for this patient ( 17 cm of water) - No significant central sleep apnea occurred during the diagnostic portion of the study (CAI = 0.0/hour). - Severe oxygen desaturation was noted during the diagnostic portion of the study (Min O2 = 56.00%). - The patient snored with Loud snoring volume during the diagnostic portion of the study. - EKG findings include PVCs. - Clinically significant periodic limb movements did not occur during sleep.  DIAGNOSIS - Obstructive Sleep Apnea (327.23 [G47.33 ICD-10])  RECOMMENDATIONS - Trial of CPAP therapy on 17 cm H2O with a Medium size Fisher[employer]Paykel Full Face Mask Simplus mask and heated humidification. - Avoid alcohol, sedatives and other CNS depressants that may worsen sleep apnea and disrupt normal sleep architecture. - Sleep hygiene should be reviewed to assess factors that may improve sleep quality. - Weight management and regular  exercise should be initiated or continued.  Deneise Lever Diplomate, American Board of Sleep Medicine  ELECTRONICALLY SIGNED ON:   01/17/2016, 9:59 AM [place] PH: [phone]   FX: [phone] [place]

## 2016-01-20 ENCOUNTER — Ambulatory Visit: Payer: Self-pay | Admitting: Pharmacist Clinician (PhC)/ Clinical Pharmacy Specialist

## 2016-02-16 ENCOUNTER — Ambulatory Visit: Payer: Self-pay | Admitting: Cardiology

## 2016-02-17 ENCOUNTER — Encounter: Payer: Self-pay | Admitting: *Deleted

## 2016-02-24 ENCOUNTER — Other Ambulatory Visit: Payer: Self-pay | Admitting: Internal Medicine

## 2016-02-24 MED FILL — hydrALAZINE HCL 100 MG TABS: 100 | 30 days supply | Qty: 90 | Fill #1

## 2016-02-24 MED FILL — LABETALOL HCL 200 MG TABLET: 200 | 30 days supply | Qty: 60 | Fill #2

## 2016-02-24 MED FILL — ISOSORBIDE MN ER 60 MG TAB: 60 | 30 days supply | Qty: 30 | Fill #2

## 2016-02-24 MED FILL — ?LISINOPRIL 20 MG TABLET: 20 | 30 days supply | Qty: 30 | Fill #2

## 2016-02-24 MED FILL — AMLODIPINE BESYLATE 10 MG T: 10 | 30 days supply | Qty: 30 | Fill #2

## 2016-02-24 MED FILL — FUROSEMIDE 40 MG TABLET: 40 | 30 days supply | Qty: 60 | Fill #0

## 2016-03-15 ENCOUNTER — Telehealth: Payer: Self-pay | Admitting: Internal Medicine

## 2016-03-15 NOTE — Telephone Encounter (Signed)
Pt called requesting lab results for kidney function, pt states he needs it for his job and stating protein was high due to high blood pressure, please f/up

## 2016-03-15 NOTE — Telephone Encounter (Signed)
Noted done, he needs to make apt w/ me in next 1- 2 wks for htn f/u.  dw w/ [place] CMA to call pt about letter and f/u appt.

## 2016-03-15 NOTE — Telephone Encounter (Signed)
Contacted pt to inform him that his letter is ready for pick up. Pt also states he has an appt coming up on March 29, 2016

## 2016-03-29 ENCOUNTER — Ambulatory Visit: Payer: Self-pay | Admitting: Internal Medicine

## 2016-08-23 ENCOUNTER — Ambulatory Visit: Payer: Self-pay | Admitting: Internal Medicine

## 2016-08-31 ENCOUNTER — Other Ambulatory Visit: Payer: Self-pay | Admitting: Internal Medicine

## 2016-08-31 MED FILL — ?LISINOPRIL 20 MG TABLET: 20 | 30 days supply | Qty: 30 | Fill #3

## 2016-08-31 MED FILL — ISOSORBIDE MN ER 60 MG TAB: 60 | 30 days supply | Qty: 30 | Fill #3

## 2016-08-31 MED FILL — hydrALAZINE HCL 100 MG TABS: 100 | 30 days supply | Qty: 90 | Fill #2

## 2016-09-02 MED FILL — ?AMLODIPINE BESYLATE 10 MG: 10 | 30 days supply | Qty: 30 | Fill #0

## 2016-11-17 ENCOUNTER — Emergency Department (HOSPITAL_COMMUNITY): Payer: Medicaid Other

## 2016-11-17 ENCOUNTER — Encounter (HOSPITAL_COMMUNITY): Payer: Self-pay | Admitting: Emergency Medicine

## 2016-11-17 ENCOUNTER — Inpatient Hospital Stay (HOSPITAL_COMMUNITY)
Admission: EM | Admit: 2016-11-17 | Discharge: 2016-11-21 | DRG: 291 | Disposition: A | Discharge status: Home / Self Care | Payer: Medicaid Other | Attending: Family Medicine | Admitting: Family Medicine

## 2016-11-17 DIAGNOSIS — Z6841 Body Mass Index (BMI) 40.0 and over, adult: Secondary | ICD-10-CM

## 2016-11-17 DIAGNOSIS — Z880 Allergy status to penicillin: Secondary | ICD-10-CM | POA: Diagnosis not present

## 2016-11-17 DIAGNOSIS — Y92230 Patient room in hospital as the place of occurrence of the external cause: Secondary | ICD-10-CM | POA: Diagnosis not present

## 2016-11-17 DIAGNOSIS — R778 Other specified abnormalities of plasma proteins: Secondary | ICD-10-CM

## 2016-11-17 DIAGNOSIS — G4733 Obstructive sleep apnea (adult) (pediatric): Secondary | ICD-10-CM | POA: Diagnosis present

## 2016-11-17 DIAGNOSIS — I5033 Acute on chronic diastolic (congestive) heart failure: Secondary | ICD-10-CM | POA: Diagnosis present

## 2016-11-17 DIAGNOSIS — N182 Chronic kidney disease, stage 2 (mild): Secondary | ICD-10-CM | POA: Diagnosis present

## 2016-11-17 DIAGNOSIS — Z79899 Other long term (current) drug therapy: Secondary | ICD-10-CM

## 2016-11-17 DIAGNOSIS — R7989 Other specified abnormal findings of blood chemistry: Secondary | ICD-10-CM

## 2016-11-17 DIAGNOSIS — Z8249 Family history of ischemic heart disease and other diseases of the circulatory system: Secondary | ICD-10-CM | POA: Diagnosis not present

## 2016-11-17 DIAGNOSIS — G4730 Sleep apnea, unspecified: Secondary | ICD-10-CM | POA: Diagnosis present

## 2016-11-17 DIAGNOSIS — I160 Hypertensive urgency: Secondary | ICD-10-CM | POA: Diagnosis present

## 2016-11-17 DIAGNOSIS — Z9119 Patient's noncompliance with other medical treatment and regimen: Secondary | ICD-10-CM | POA: Diagnosis not present

## 2016-11-17 DIAGNOSIS — T501X5A Adverse effect of loop [high-ceiling] diuretics, initial encounter: Secondary | ICD-10-CM | POA: Diagnosis not present

## 2016-11-17 DIAGNOSIS — Z9114 Patient's other noncompliance with medication regimen: Secondary | ICD-10-CM | POA: Diagnosis not present

## 2016-11-17 DIAGNOSIS — N179 Acute kidney failure, unspecified: Secondary | ICD-10-CM | POA: Diagnosis present

## 2016-11-17 DIAGNOSIS — E785 Hyperlipidemia, unspecified: Secondary | ICD-10-CM | POA: Diagnosis present

## 2016-11-17 DIAGNOSIS — Z833 Family history of diabetes mellitus: Secondary | ICD-10-CM | POA: Diagnosis not present

## 2016-11-17 DIAGNOSIS — I2781 Cor pulmonale (chronic): Secondary | ICD-10-CM | POA: Diagnosis present

## 2016-11-17 DIAGNOSIS — I10 Essential (primary) hypertension: Secondary | ICD-10-CM

## 2016-11-17 DIAGNOSIS — I5023 Acute on chronic systolic (congestive) heart failure: Secondary | ICD-10-CM

## 2016-11-17 DIAGNOSIS — I130 Hypertensive heart and chronic kidney disease with heart failure and stage 1 through stage 4 chronic kidney disease, or unspecified chronic kidney disease: Secondary | ICD-10-CM | POA: Diagnosis present

## 2016-11-17 DIAGNOSIS — I509 Heart failure, unspecified: Secondary | ICD-10-CM

## 2016-11-17 DIAGNOSIS — R7303 Prediabetes: Secondary | ICD-10-CM | POA: Diagnosis present

## 2016-11-17 DIAGNOSIS — R0602 Shortness of breath: Secondary | ICD-10-CM | POA: Diagnosis present

## 2016-11-17 LAB — CBC
HEMATOCRIT: 38.6 % — AB (ref 39.0–52.0)
HEMOGLOBIN: 13.1 g/dL (ref 13.0–17.0)
MCH: 23.1 pg — AB (ref 26.0–34.0)
MCHC: 33.9 g/dL (ref 30.0–36.0)
MCV: 68.2 fL — AB (ref 78.0–100.0)
Platelets: 368 10*3/uL (ref 150–400)
RBC: 5.66 MIL/uL (ref 4.22–5.81)
RDW: 14.6 % (ref 11.5–15.5)
WBC: 8.2 10*3/uL (ref 4.0–10.5)

## 2016-11-17 LAB — BASIC METABOLIC PANEL
ANION GAP: 9 (ref 5–15)
BUN: 15 mg/dL (ref 6–20)
CHLORIDE: 104 mmol/L (ref 101–111)
CO2: 26 mmol/L (ref 22–32)
Calcium: 9.2 mg/dL (ref 8.9–10.3)
Creatinine, Ser: 1.37 mg/dL — ABNORMAL HIGH (ref 0.61–1.24)
GFR calc Af Amer: >60 mL/min (ref >60)
GFR calc non Af Amer: >60 mL/min (ref >60)
GLUCOSE: 99 mg/dL (ref 65–99)
Potassium: 3.9 mmol/L (ref 3.5–5.1)
Sodium: 139 mmol/L (ref 135–145)

## 2016-11-17 LAB — I-STAT TROPONIN, ED: Troponin i, poc: 0.1 ng/mL (ref 0.00–0.08)

## 2016-11-17 LAB — BRAIN NATRIURETIC PEPTIDE: B NATRIURETIC PEPTIDE 5: 384.3 pg/mL — AB (ref 0.0–100.0)

## 2016-11-17 MED ORDER — NITROGLYCERIN IN D5W 200-5 MCG/ML-% IV SOLN
5.0000 ug/min | INTRAVENOUS | Status: DC
  Administered 2016-11-17: 5 ug/min via INTRAVENOUS
  Filled 2016-11-17 (×2): qty 250

## 2016-11-17 MED ORDER — FUROSEMIDE 10 MG/ML IJ SOLN
100.0000 mg | Freq: Once | INTRAVENOUS | Status: AC
  Administered 2016-11-17: 100 mg via INTRAVENOUS
  Filled 2016-11-17: qty 10

## 2016-11-17 NOTE — ED Notes (Signed)
Dr Rosalia Hammers and [place] given a copy of troponin results .10

## 2016-11-17 NOTE — ED Triage Notes (Signed)
Patient with history of CHF, having two to three days of off and on chest pain with increased shortness of breath.  He denies any nausea or vomiting.  No diaphoresis.  Patient has had a non productive cough.  Patient does have edema noted in lower extremities.

## 2016-11-17 NOTE — ED Provider Notes (Signed)
[facility]-EMERGENCY DEPT Provider Note   CSN: [id number] Arrival date & time: 11/17/16  2058     History   Chief Complaint Chief Complaint  Patient presents with  . Chest Pain  . Shortness of Breath    HPI Paul Lamb is a 31 y.o. male.  HPI There is a 31 year old man with a history of hypertension and congestive heart failure who presents today with increasing dyspnea over several days. He has been noncompliant with several his medications and has taken none of them today. He states that they have been giving him headaches. He does note that he has been getting increasingly dyspneic over several days with increased peripheral edema. He has had him chest discomfort that he associates with dyspnea. Patient was initially seen and evaluated triage with labs and was noted to have elevated troponin to my valuation. Past Medical History:  Diagnosis Date  . Acute diastolic (congestive) heart failure 01/12/2016  . Hypertension     Patient Active Problem List   Diagnosis Date Noted  . Acute diastolic (congestive) heart failure 01/12/2016  . Uncontrolled hypertension 01/12/2016  . Sleep apnea 01/12/2016  . AKI (acute kidney injury) (HCC)   . Cardiomegaly - hypertensive   . Elevated d-dimer   . SOB (shortness of breath)   . Abnormal EKG   . DOE (dyspnea on exertion)   . Malignant hypertension   . Peripheral edema   . Pulmonary edema cardiac cause (HCC)   . Cough 11/01/2015  . Hypertensive emergency 11/01/2015  . Hand pain, left 11/01/2015  . Morbid obesity (HCC) 11/01/2015  . Bilateral lower extremity edema 11/01/2015  . Elevated serum creatinine 11/01/2015  . Chest tightness 11/01/2015    Past Surgical History:  Procedure Laterality Date  . NO PAST SURGERIES         Home Medications    Prior to Admission medications   Medication Sig Start Date End Date Taking? Authorizing Provider  amLODipine (NORVASC) 10 MG tablet Take 1 tablet (10 mg total) by mouth daily. 12/21/15   Yes Pete Glatter, MD  aspirin-acetaminophen-caffeine (EXCEDRIN MIGRAINE) [phone] MG tablet Take 2 tablets by mouth every 6 (six) hours as needed for headache.   Yes Historical Provider, MD  furosemide (LASIX) 40 MG tablet Take 1 tablet (40 mg total) by mouth 2 (two) times daily. Must have office visit for refills 02/24/16  Yes Pete Glatter, MD  hydrALAZINE (APRESOLINE) 100 MG tablet Take 1 tablet (100 mg total) by mouth every 8 (eight) hours. 11/10/15  Yes Pete Glatter, MD  isosorbide mononitrate (IMDUR) 60 MG 24 hr tablet Take 1 tablet (60 mg total) by mouth daily. 12/21/15  Yes Pete Glatter, MD  labetalol (NORMODYNE) 200 MG tablet Take 1 tablet (200 mg total) by mouth 2 (two) times daily. 11/10/15  Yes Pete Glatter, MD  lisinopril (PRINIVIL,ZESTRIL) 20 MG tablet Take 1 tablet (20 mg total) by mouth daily. 12/21/15  Yes Dawn Marland Mcalpine, MD  Pseudoeph-Doxylamine-DM-APAP (TYLENOL COLD/FLU SEVERE NIGHT PO) Take 2 tablets by mouth daily as needed (cold).   Yes Historical Provider, MD    Family History Family History  Problem Relation Age of Onset  . Hypertension    . Diabetes Mellitus II      Social History Social History  Substance Use Topics  . Smoking status: Never Smoker  . Smokeless tobacco: Never Used  . Alcohol use No     Allergies   Penicillins   Review of Systems Review of  Systems  Constitutional: Positive for activity change and fatigue. Negative for chills and diaphoresis.  HENT: Negative.   Eyes: Negative.   Respiratory: Positive for chest tightness and shortness of breath.   Cardiovascular: Positive for leg swelling.  Gastrointestinal: Negative.   Endocrine: Negative.   Genitourinary: Negative.   Musculoskeletal: Negative.   Allergic/Immunologic: Negative.   Neurological: Negative.   Hematological: Negative.   Psychiatric/Behavioral: Negative.   All other systems reviewed and are negative.    Physical Exam Updated Vital Signs BP (!) 256/180    Pulse (!) 108   Temp 98.8 F (37.1 C) (Oral)   Resp (!) 34   SpO2 95%   Physical Exam  Constitutional: He is oriented to person, place, and time. He appears well-developed and well-nourished.  Diaphoretic  HENT:  Head: Normocephalic and atraumatic.  Right Ear: External ear normal.  Left Ear: External ear normal.  Nose: Nose normal.  Mouth/Throat: Oropharynx is clear and moist.  Eyes: Conjunctivae and EOM are normal. Pupils are equal, round, and reactive to light.  Neck: Normal range of motion. Neck supple.  Cardiovascular: Normal rate and regular rhythm.   Pulmonary/Chest: Effort normal.  Decreased breath sounds bilateral bases  Abdominal: Soft. Bowel sounds are normal.  Musculoskeletal: He exhibits edema.  Pitting edema to bilateral knees  Neurological: He is alert and oriented to person, place, and time.  Skin: Skin is warm and dry. Capillary refill takes less than 2 seconds.  Psychiatric: He has a normal mood and affect.  Nursing note and vitals reviewed.    ED Treatments / Results  Labs (all labs ordered are listed, but only abnormal results are displayed) Labs Reviewed  BASIC METABOLIC PANEL - Abnormal; Notable for the following:       Result Value   Creatinine, Ser 1.37 (*)    All other components within normal limits  CBC - Abnormal; Notable for the following:    HCT 38.6 (*)    MCV 68.2 (*)    MCH 23.1 (*)    All other components within normal limits  BRAIN NATRIURETIC PEPTIDE - Abnormal; Notable for the following:    B Natriuretic Peptide 384.3 (*)    All other components within normal limits  I-STAT TROPOININ, ED - Abnormal; Notable for the following:    Troponin i, poc 0.10 (*)    All other components within normal limits    EKG  EKG Interpretation  Date/Time:  Thursday November 17 2016 21:04:11 EDT Ventricular Rate:  109 PR Interval:  152 QRS Duration: 90 QT Interval:  350 QTC Calculation: 471 R Axis:   -3 Text Interpretation:  Sinus  tachycardia Otherwise normal ECG Confirmed by Ranveer Wahlstrom MD, Duwayne Heck ([id number]) on 11/17/2016 10:34:04 PM       Radiology Dg Chest 2 View  Result Date: 11/17/2016 CLINICAL DATA:  Acute onset of generalized chest pain, shortness of breath and cough. Headache. Initial encounter. EXAM: CHEST  2 VIEW COMPARISON:  Chest radiograph and CTA of the chest performed 11/01/2015 FINDINGS: The lungs are well-aerated. Vascular congestion is noted. Increased interstitial markings raise concern for pulmonary edema, though pneumonia could have a similar appearance. This is mildly worsened from the prior study. No pleural effusion or pneumothorax is seen. The heart is mildly enlarged. No acute osseous abnormalities are seen. IMPRESSION: Vascular congestion and mild cardiomegaly. Increased interstitial markings raise concern for pulmonary edema, though pneumonia could have a similar appearance. Electronically Signed   By: Roanna Raider M.D.   On: 11/17/2016 21:52  Procedures Procedures (including critical care time)  Medications Ordered in ED Medications  furosemide (LASIX) 100 mg in dextrose 5 % 50 mL IVPB (not administered)  nitroGLYCERIN 50 mg in dextrose 5 % 250 mL (0.2 mg/mL) infusion (not administered)     Initial Impression / Assessment and Plan / ED Course  I have reviewed the triage vital signs and the nursing notes.  Pertinent labs & imaging results that were available during my care of the patient were reviewed by me and considered in my medical decision making (see chart for details).     31 year old male with known congestive heart failure and malignant hypertension who presents today with noncompliance of medications. He is started here on Lasix and nitro drip. Plan admission to hospital for further evaluation and treatment.   Discussed with Dr. Toniann Fail and he will admit  Final Clinical Impressions(s) / ED Diagnoses   Final diagnoses:  Congestive heart failure, unspecified congestive heart  failure chronicity, unspecified congestive heart failure type (HCC)  Uncontrolled hypertension  Noncompliance with medications  Elevated troponin    New Prescriptions New Prescriptions   No medications on file     Margarita Grizzle, MD 11/17/16 2311

## 2016-11-18 ENCOUNTER — Encounter (HOSPITAL_COMMUNITY): Payer: Self-pay | Admitting: Internal Medicine

## 2016-11-18 DIAGNOSIS — R748 Abnormal levels of other serum enzymes: Secondary | ICD-10-CM

## 2016-11-18 DIAGNOSIS — I509 Heart failure, unspecified: Secondary | ICD-10-CM

## 2016-11-18 DIAGNOSIS — I5033 Acute on chronic diastolic (congestive) heart failure: Secondary | ICD-10-CM

## 2016-11-18 DIAGNOSIS — I5041 Acute combined systolic (congestive) and diastolic (congestive) heart failure: Secondary | ICD-10-CM

## 2016-11-18 DIAGNOSIS — I160 Hypertensive urgency: Secondary | ICD-10-CM

## 2016-11-18 HISTORY — DX: Acute combined systolic (congestive) and diastolic (congestive) heart failure: I50.41

## 2016-11-18 LAB — CBC
HCT: 38.8 % — ABNORMAL LOW (ref 39.0–52.0)
HEMATOCRIT: 38 % — AB (ref 39.0–52.0)
Hemoglobin: 13.1 g/dL (ref 13.0–17.0)
Hemoglobin: 12.8 g/dL — ABNORMAL LOW (ref 13.0–17.0)
MCH: 22.9 pg — AB (ref 26.0–34.0)
MCH: 22.9 pg — ABNORMAL LOW (ref 26.0–34.0)
MCHC: 33.8 g/dL (ref 30.0–36.0)
MCHC: 33.7 g/dL (ref 30.0–36.0)
MCV: 67.7 fL — AB (ref 78.0–100.0)
MCV: 67.9 fL — AB (ref 78.0–100.0)
PLATELETS: 353 10*3/uL (ref 150–400)
Platelets: 335 10*3/uL (ref 150–400)
RBC: 5.6 MIL/uL (ref 4.22–5.81)
RBC: 5.73 MIL/uL (ref 4.22–5.81)
RDW: 14.6 % (ref 11.5–15.5)
RDW: 14.6 % (ref 11.5–15.5)
WBC: 7.7 10*3/uL (ref 4.0–10.5)
WBC: 8.6 10*3/uL (ref 4.0–10.5)

## 2016-11-18 LAB — TROPONIN I
TROPONIN I: 0.09 ng/mL — AB (ref <0.03)
Troponin I: 0.07 ng/mL (ref <0.03)
Troponin I: 0.05 ng/mL (ref <0.03)

## 2016-11-18 LAB — CREATININE, SERUM
CREATININE: 1.36 mg/dL — AB (ref 0.61–1.24)
GFR calc Af Amer: >60 mL/min (ref >60)
GFR calc non Af Amer: >60 mL/min (ref >60)

## 2016-11-18 LAB — MRSA PCR SCREENING: MRSA by PCR: NEGATIVE

## 2016-11-18 LAB — MAGNESIUM
MAGNESIUM: 1.6 mg/dL — AB (ref 1.7–2.4)
Magnesium: 1.6 mg/dL — ABNORMAL LOW (ref 1.7–2.4)

## 2016-11-18 LAB — HIV ANTIBODY (ROUTINE TESTING W REFLEX): HIV Screen 4th Generation wRfx: NONREACTIVE

## 2016-11-18 LAB — TSH: TSH: 4.038 u[IU]/mL (ref 0.350–4.500)

## 2016-11-18 MED ORDER — SODIUM CHLORIDE 0.9% FLUSH
3.0000 mL | Freq: Two times a day (BID) | INTRAVENOUS | Status: DC
  Administered 2016-11-18 – 2016-11-21 (×6): 3 mL via INTRAVENOUS

## 2016-11-18 MED ORDER — ISOSORBIDE MONONITRATE ER 30 MG PO TB24
60.0000 mg | ORAL_TABLET | Freq: Every day | ORAL | Status: DC
  Administered 2016-11-18 (×2): 60 mg via ORAL
  Filled 2016-11-18 (×2): qty 2

## 2016-11-18 MED ORDER — ACETAMINOPHEN 650 MG RE SUPP: 650.0000 mg | Freq: Four times a day (QID) | RECTAL | Status: DC | PRN

## 2016-11-18 MED ORDER — HYDRALAZINE HCL 50 MG PO TABS
100.0000 mg | ORAL_TABLET | Freq: Three times a day (TID) | ORAL | Status: DC
  Administered 2016-11-18 – 2016-11-19 (×4): 100 mg via ORAL
  Filled 2016-11-18 (×4): qty 2

## 2016-11-18 MED ORDER — ENOXAPARIN SODIUM 100 MG/ML ~~LOC~~ SOLN
90.0000 mg | Freq: Every day | SUBCUTANEOUS | Status: DC
  Administered 2016-11-18 – 2016-11-21 (×4): 90 mg via SUBCUTANEOUS
  Filled 2016-11-18 (×4): qty 1

## 2016-11-18 MED ORDER — FUROSEMIDE 10 MG/ML IJ SOLN
60.0000 mg | Freq: Two times a day (BID) | INTRAMUSCULAR | Status: DC
  Administered 2016-11-18: 60 mg via INTRAVENOUS
  Filled 2016-11-18: qty 6

## 2016-11-18 MED ORDER — POTASSIUM CHLORIDE CRYS ER 20 MEQ PO TBCR: 20.0000 meq | EXTENDED_RELEASE_TABLET | Freq: Every day | ORAL | Status: DC

## 2016-11-18 MED ORDER — FUROSEMIDE 10 MG/ML IJ SOLN
80.0000 mg | Freq: Two times a day (BID) | INTRAMUSCULAR | Status: DC
  Administered 2016-11-18 – 2016-11-19 (×2): 80 mg via INTRAVENOUS
  Filled 2016-11-18 (×2): qty 8

## 2016-11-18 MED ORDER — ASPIRIN EC 81 MG PO TBEC
81.0000 mg | DELAYED_RELEASE_TABLET | Freq: Every day | ORAL | Status: DC
  Administered 2016-11-18 – 2016-11-21 (×4): 81 mg via ORAL
  Filled 2016-11-18 (×4): qty 1

## 2016-11-18 MED ORDER — ACETAMINOPHEN 325 MG PO TABS
650.0000 mg | ORAL_TABLET | Freq: Four times a day (QID) | ORAL | Status: DC | PRN
  Administered 2016-11-18 – 2016-11-20 (×3): 650 mg via ORAL
  Filled 2016-11-18 (×3): qty 2

## 2016-11-18 MED ORDER — MAGNESIUM SULFATE 50 % IJ SOLN
3.0000 g | Freq: Once | INTRAVENOUS | Status: AC
  Administered 2016-11-18: 3 g via INTRAVENOUS
  Filled 2016-11-18: qty 6

## 2016-11-18 MED ORDER — POTASSIUM CHLORIDE CRYS ER 20 MEQ PO TBCR
40.0000 meq | EXTENDED_RELEASE_TABLET | Freq: Every day | ORAL | Status: DC
  Administered 2016-11-18 – 2016-11-19 (×2): 40 meq via ORAL
  Filled 2016-11-18 (×2): qty 2

## 2016-11-18 MED ORDER — AMLODIPINE BESYLATE 5 MG PO TABS
10.0000 mg | ORAL_TABLET | Freq: Every day | ORAL | Status: DC
  Administered 2016-11-18: 10 mg via ORAL
  Filled 2016-11-18: qty 2

## 2016-11-18 MED ORDER — LABETALOL HCL 200 MG PO TABS
200.0000 mg | ORAL_TABLET | Freq: Two times a day (BID) | ORAL | Status: DC
Start: 2016-11-18 — End: 2016-11-21
  Administered 2016-11-18 – 2016-11-21 (×7): 200 mg via ORAL
  Filled 2016-11-18 (×7): qty 1

## 2016-11-18 MED ORDER — LISINOPRIL 20 MG PO TABS
20.0000 mg | ORAL_TABLET | Freq: Every day | ORAL | Status: DC
  Administered 2016-11-18 (×2): 20 mg via ORAL
  Filled 2016-11-18 (×2): qty 1

## 2016-11-18 NOTE — H&P (Signed)
History and Physical    Paul Lamb ZOX:[id number] DOB: 07-17-1986 DOA: 11/17/2016  PCP: No PCP Per Patient  Patient coming from: Home.  Chief Complaint: Shortness of breath. Chest pain.  HPI: Paul Lamb is a 31 y.o. male with history of diastolic CHF, hypertension, sleep apnea and morbid obesity since to the ER with complaints of shortness of breath and chest pressure increasing over the last few days. Patient states he has not been compliant with his medications. Chest pressure was retrosternal associated with shortness of breath. Patient also has been having increased peripheral edema. Patient has not been taking some of his medications because patient states some of them causes headache.  ED Course: In the ER patient was found to have markedly elevated blood pressure with systolic blood pressure more than 220. Chest x-ray shows congestion consistent with CHF. On exam patient also has lower extremity edema. Patient was given Lasix 100 mg IV and IV nitroglycerin infusion was started. On exam patient appears nonfocal.  Review of Systems: As per HPI, rest all negative.   Past Medical History:  Diagnosis Date  . Acute diastolic (congestive) heart failure 01/12/2016  . Hypertension     Past Surgical History:  Procedure Laterality Date  . NO PAST SURGERIES       reports that he has never smoked. He has never used smokeless tobacco. He reports that he does not drink alcohol or use drugs.  Allergies  Allergen Reactions  . Penicillins Other (See Comments)    Family History  Problem Relation Age of Onset  . Hypertension    . Diabetes Mellitus II      Prior to Admission medications   Medication Sig Start Date End Date Taking? Authorizing Provider  amLODipine (NORVASC) 10 MG tablet Take 1 tablet (10 mg total) by mouth daily. 12/21/15  Yes Pete Glatter, MD  aspirin-acetaminophen-caffeine (EXCEDRIN MIGRAINE) [phone] MG tablet Take 2 tablets by mouth every 6 (six) hours  as needed for headache.   Yes Historical Provider, MD  furosemide (LASIX) 40 MG tablet Take 1 tablet (40 mg total) by mouth 2 (two) times daily. Must have office visit for refills 02/24/16  Yes Pete Glatter, MD  hydrALAZINE (APRESOLINE) 100 MG tablet Take 1 tablet (100 mg total) by mouth every 8 (eight) hours. 11/10/15  Yes Pete Glatter, MD  isosorbide mononitrate (IMDUR) 60 MG 24 hr tablet Take 1 tablet (60 mg total) by mouth daily. 12/21/15  Yes Pete Glatter, MD  labetalol (NORMODYNE) 200 MG tablet Take 1 tablet (200 mg total) by mouth 2 (two) times daily. 11/10/15  Yes Pete Glatter, MD  lisinopril (PRINIVIL,ZESTRIL) 20 MG tablet Take 1 tablet (20 mg total) by mouth daily. 12/21/15  Yes Dawn Marland Mcalpine, MD  Pseudoeph-Doxylamine-DM-APAP (TYLENOL COLD/FLU SEVERE NIGHT PO) Take 2 tablets by mouth daily as needed (cold).   Yes Historical Provider, MD    Physical Exam: Vitals:   11/17/16 2300 11/17/16 2315 11/17/16 2330 11/17/16 2345  BP: (!) 183/135 (!) 172/116 (!) 169/111 (!) 172/115  Pulse: 96 96 95 (!) 106  Resp: (!) 21 (!) 29 (!) 29   Temp:      TempSrc:      SpO2: 92% (!) 89% 96% (!) 87%      Constitutional:Morbidly obese not in distress. Vitals:   11/17/16 2300 11/17/16 2315 11/17/16 2330 11/17/16 2345  BP: (!) 183/135 (!) 172/116 (!) 169/111 (!) 172/115  Pulse: 96 96 95 (!) 106  Resp: Marland Kitchen)  21 (!) 29 (!) 29   Temp:      TempSrc:      SpO2: 92% (!) 89% 96% (!) 87%   Eyes: Anicteric no pallor. ENMT: No discharge from the ears eyes nose or mouth. Neck: No mass felt. JVD not appreciated. Respiratory: No rhonchi or crepitations. Cardiovascular: S1 and S2. No murmurs appreciated. Abdomen: Soft nontender bowel sounds present. Musculoskeletal: Bilateral lower extremity edema present. Skin: No rash. Skin appears warm. Neurologic: Alert awake oriented to time place and person. Moves all extremities. Psychiatric: Appears normal. Normal affect.   Labs on Admission: I have  personally reviewed following labs and imaging studies  CBC:  Recent Labs Lab 11/17/16 2106  WBC 8.2  HGB 13.1  HCT 38.6*  MCV 68.2*  PLT 368   Basic Metabolic Panel:  Recent Labs Lab 11/17/16 2106  NA 139  K 3.9  CL 104  CO2 26  GLUCOSE 99  BUN 15  CREATININE 1.37*  CALCIUM 9.2   GFR: CrCl cannot be calculated (Unknown ideal weight.). Liver Function Tests: No results for input(s): AST, ALT, ALKPHOS, BILITOT, PROT, ALBUMIN in the last 168 hours. No results for input(s): LIPASE, AMYLASE in the last 168 hours. No results for input(s): AMMONIA in the last 168 hours. Coagulation Profile: No results for input(s): INR, PROTIME in the last 168 hours. Cardiac Enzymes: No results for input(s): CKTOTAL, CKMB, CKMBINDEX, TROPONINI in the last 168 hours. BNP (last 3 results) No results for input(s): PROBNP in the last 8760 hours. HbA1C: No results for input(s): HGBA1C in the last 72 hours. CBG: No results for input(s): GLUCAP in the last 168 hours. Lipid Profile: No results for input(s): CHOL, HDL, LDLCALC, TRIG, CHOLHDL, LDLDIRECT in the last 72 hours. Thyroid Function Tests: No results for input(s): TSH, T4TOTAL, FREET4, T3FREE, THYROIDAB in the last 72 hours. Anemia Panel: No results for input(s): VITAMINB12, FOLATE, FERRITIN, TIBC, IRON, RETICCTPCT in the last 72 hours. Urine analysis:    Component Value Date/Time   COLORURINE AMBER (A) 11/01/2015 1449   APPEARANCEUR CLEAR 11/01/2015 1449   LABSPEC 1.040 (H) 11/01/2015 1449   PHURINE 6.5 11/01/2015 1449   GLUCOSEU NEGATIVE 11/01/2015 1449   HGBUR NEGATIVE 11/01/2015 1449   BILIRUBINUR NEGATIVE 11/01/2015 1449   KETONESUR NEGATIVE 11/01/2015 1449   PROTEINUR >300 (A) 11/01/2015 1449   NITRITE NEGATIVE 11/01/2015 1449   LEUKOCYTESUR NEGATIVE 11/01/2015 1449   Sepsis Labs: @LABRCNTIP (procalcitonin:4,lacticidven:4) )No results found for this or any previous visit (from the past 240 hour(s)).   Radiological  Exams on Admission: Dg Chest 2 View  Result Date: 11/17/2016 CLINICAL DATA:  Acute onset of generalized chest pain, shortness of breath and cough. Headache. Initial encounter. EXAM: CHEST  2 VIEW COMPARISON:  Chest radiograph and CTA of the chest performed 11/01/2015 FINDINGS: The lungs are well-aerated. Vascular congestion is noted. Increased interstitial markings raise concern for pulmonary edema, though pneumonia could have a similar appearance. This is mildly worsened from the prior study. No pleural effusion or pneumothorax is seen. The heart is mildly enlarged. No acute osseous abnormalities are seen. IMPRESSION: Vascular congestion and mild cardiomegaly. Increased interstitial markings raise concern for pulmonary edema, though pneumonia could have a similar appearance. Electronically Signed   By: Roanna Raider M.D.   On: 11/17/2016 21:52    EKG: Independently reviewed. Sinus tachycardia.  Assessment/Plan Principal Problem:   Acute on chronic diastolic CHF (congestive heart failure) (HCC) Active Problems:   Morbid obesity (HCC)   Sleep apnea   Hypertensive urgency  CHF (congestive heart failure) (HCC)    1. Acute on chronic diastolic CHF - last EF measured in March 2017 was 55-60%. Patient did receive Lasix 100 mg in the ER and I have placed patient on Lasix 60 mg IV every 12 hourly. Patient also was started on nitroglycerin infusion which will be weaned off of the patient takes by mouth Imdur. Cycle cardiac markers and recheck 2-D echo. Closely follow intake output metabolic panel and daily weights. 2. Hypertensive urgency - patient has been placed on nitroglycerin infusion which will be weaned off after patient takes Imdur and has been placed on patient's home medications including labetalol and hydralazine lisinopril and amlodipine. 3. Sleep apnea on CPAP. 4. Chest discomfort most likely secondary to uncontrolled blood pressure and CHF - full cycle cardiac markers and check 2-D  echo.   DVT prophylaxis: Lovenox. Code Status: Full code.  Family Communication: Discussed with patient's family.  Disposition Plan: Home.  Consults called: None.  Admission status: Inpatient.    Eduard Clos MD [facility] Pager [phone].  If 7PM-7AM, please contact night-coverage [email or web address] Password [facility]  11/18/2016, 12:27 AM

## 2016-11-18 NOTE — Progress Notes (Signed)
11/17/2016  9:57 PM  11/18/2016 7:22 AM  Paul Lamb was seen and examined.  The H&P by the admitting provider, orders, imaging was reviewed.  Please see orders.  Will continue to follow.   Maryln Manuel, MD [facility]

## 2016-11-18 NOTE — ED Notes (Signed)
Pt and family made aware of bed assignment 

## 2016-11-18 NOTE — Consult Note (Signed)
Cardiology Consult    Patient ID: Paul Lamb [facility] MRN: [id number], DOB/AGE: [birth year]   Admit date: 11/17/2016 Date of Consult: 11/18/2016  Primary Physician: No PCP Per Patient Reason for Consult: CHF Primary Cardiologist: Dr. Allyson Sabal  Requesting Provider: Dr. Laural Benes   History of Present Illness    Paul Lamb is a 31 y.o. male with PMH of chronic diastolic CHF (EF 40-98% by echo in 10/2015), HTN, and OSA (on CPAP) who is being seen today for the evaluation of CHF at the request of Dr. Laural Benes.   He was last seen by Corine Shelter, PA-C in 12/2015 for recent hospital follow-up from 10/2015 when he diuresed over 50 lbs. Discharge weight in 10/2015 was 379 lbs, but was up to 404 lbs at the time of his office visit. It was recommended he continue Lasix 40mg  BID with the addition of Metolazone 2.5mg  three times weekly and come back to the office in 4 weeks. It appears he has not followed up with Cardiology since then.   He presented to [facility] ED on 11/17/2016 due to bringing his child in for stepping on a nail, but while in the waiting room, he developed significant chest pain and dyspnea. He reports worsening dyspnea over the past several days. He quit taking his PO Lasix approximately 2 months ago due to running out of the medication. Says he only takes his BP medications sporadically due to the side-effect of a headache.   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 Says he limits fast-food intake and mostly consumes fruit.   Initial BP while in the ED was at 256/180. He was started on IV NTG at that time. Labs showed WBC 8.2, Hgb 13.1, and platelets 368. Na+ 139, K+ 3.9, creatinine 1.37. Mg 1.6. BNP 384. Initial troponin 0.10 with repeat values of 0.09, 0.07 and 0.05. CXR showing vascular congestion and mild cardiomegaly with interstitial markings likely due to pulmonary edema with PNA not excluded. EKG shows sinus tachycardia, HR 109, and no  acute ST or T-wave changes.   He has been started on IV Lasix 60mg  BID with a net output of -3.8L thus far. IV NTG has been weaned and he has been restarted on his PTA medications of Amlodipine 10mg  daily, Hydralazine 100mg  Q8H, Imdur 60mg  daily, Labetalol 200mg  BID, and Lisinopril 20mg  daily.   Past Medical History   Past Medical History:  Diagnosis Date  . Acute diastolic (congestive) heart failure 01/12/2016  . Hypertension     Past Surgical History:  Procedure Laterality Date  . NO PAST SURGERIES       Allergies  Allergies  Allergen Reactions  . Penicillins Other (See Comments)    Inpatient Medications    . amLODipine  10 mg Oral Daily  . aspirin EC  81 mg Oral Daily  . enoxaparin (LOVENOX) injection  90 mg Subcutaneous Daily  . furosemide  80 mg Intravenous Q12H  . hydrALAZINE  100 mg Oral Q8H  . labetalol  200 mg Oral BID  . lisinopril  20 mg Oral Daily  . magnesium sulfate 1 - 4 g bolus IVPB  3 g Intravenous Once  . potassium chloride  40 mEq Oral Daily  . sodium chloride flush  3 mL Intravenous Q12H    Family History    Family History  Problem Relation Age of Onset  . Hypertension    . Diabetes Mellitus II  Social History    Social History   Social History  . Marital status: Married    Spouse name: N/A  . Number of children: N/A  . Years of education: N/A   Occupational History  . Not on file.   Social History Main Topics  . Smoking status: Never Smoker  . Smokeless tobacco: Never Used  . Alcohol use No  . Drug use: No  . Sexual activity: Not on file   Other Topics Concern  . Not on file   Social History Narrative  . No narrative on file     Review of Systems    General:  No chills, fever, night sweats or weight changes.  Cardiovascular:  No orthopnea, palpitations, paroxysmal nocturnal dyspnea. Positive for chest pain, dyspnea on exertion, and edema.  Dermatological: No rash, lesions/masses Respiratory: No cough, Positive for  dyspnea Urologic: No hematuria, dysuria Abdominal:   No nausea, vomiting, diarrhea, bright red blood per rectum, melena, or hematemesis Neurologic:  No visual changes, wkns, changes in mental status. All other systems reviewed and are otherwise negative except as noted above.  Physical Exam    Blood pressure 134/72, pulse 96, temperature 98.5 F (36.9 C), temperature source Oral, resp. rate (!) 32, height 6\' 2"  (1.88 m), weight (!) 405 lb 13.9 oz (184.1 kg), SpO2 93 %.  General: Pleasant morbidly obese African American male appearing in NAD Psych: Normal affect. Neuro: Alert and oriented X 3. Moves all extremities spontaneously. HEENT: Normal  Neck: Supple without bruits. JVD difficult to assess secondary to body habitus. Lungs:  Resp regular and unlabored, decreased breath sounds at bases bilaterally. Heart: RRR no s3, s4, or murmurs. Abdomen: Soft, non-tender, non-distended, BS + x 4.  Extremities: No clubbing or cyanosis. 2+ pitting edema up to thighs bilaterally. DP/PT/Radials 2+ and equal bilaterally.  Labs    Troponin [facility] Test)  Recent Labs  11/17/16 2140  TROPIPOC 0.10*    Recent Labs  11/18/16 0043 11/18/16 0636 11/18/16 1210  TROPONINI 0.09* 0.07* 0.05*   Lab Results  Component Value Date   WBC 7.7 11/18/2016   HGB 12.8 (L) 11/18/2016   HCT 38.0 (L) 11/18/2016   MCV 67.9 (L) 11/18/2016   PLT 335 11/18/2016     Recent Labs Lab 11/17/16 2106 11/18/16 0043  NA 139  --   K 3.9  --   CL 104  --   CO2 26  --   BUN 15  --   CREATININE 1.37* 1.36*  CALCIUM 9.2  --   GLUCOSE 99  --    No results found for: CHOL, HDL, LDLCALC, TRIG Lab Results  Component Value Date   DDIMER 2.03 (H) 11/01/2015     Radiology Studies    Dg Chest 2 View  Result Date: 11/17/2016 CLINICAL DATA:  Acute onset of generalized chest pain, shortness of breath and cough. Headache. Initial encounter. EXAM: CHEST  2 VIEW COMPARISON:  Chest radiograph and CTA of the  chest performed 11/01/2015 FINDINGS: The lungs are well-aerated. Vascular congestion is noted. Increased interstitial markings raise concern for pulmonary edema, though pneumonia could have a similar appearance. This is mildly worsened from the prior study. No pleural effusion or pneumothorax is seen. The heart is mildly enlarged. No acute osseous abnormalities are seen. IMPRESSION: Vascular congestion and mild cardiomegaly. Increased interstitial markings raise concern for pulmonary edema, though pneumonia could have a similar appearance. Electronically Signed   By: Roanna Raider M.D.   On: 11/17/2016 21:52  EKG & Cardiac Imaging    EKG: Sinus tachycardia, HR 109, and no acute ST or T-wave changes.  - Personally Reviewed  Echocardiogram: 11/04/2015 Study Conclusions  - Left ventricle: The cavity size was normal. There was severe   concentric hypertrophy. Systolic function was normal. The   estimated ejection fraction was in the range of 55% to 60%. Wall   motion was normal; there were no regional wall motion   abnormalities. Left ventricular diastolic function parameters   were normal. - Mitral valve: Transvalvular velocity was within the normal range.   There was no evidence for stenosis. There was no regurgitation. - Right ventricle: The cavity size was mildly dilated. Wall   thickness was normal. Systolic function was normal. - Tricuspid valve: There was trivial regurgitation. - Pulmonary arteries: Systolic pressure was moderately increased.   PA peak pressure: 44 mm Hg (S). - Pericardium, extracardiac: A small pericardial effusion was   identified circumferential to the heart. Features were not   consistent with tamponade physiology.  Assessment & Plan    1. Acute on Chronic Diastolic CHF - presented with worsening dyspnea with exertion and lower extremity edema for the past several weeks. Reports not taking Lasix for over a month and taking his other medications sporadically.    - echo in 10/2015 showed a preserved EF of 55-60% with no regional WMA. Repeat echo is pending. - initial BNP at 384 (likely falsely low secondary to obesity). CXR showing vascular congestion and mild cardiomegaly with interstitial markings likely due to pulmonary edema with PNA not excluded. - started on IV Lasix  BID with a net output of -3.8L thus far. Will increase to  BID as his dry weight is likely close to 380 lbs (weight at time of hospital discharge in 10/2015). Continue to follow daily weights along with I&O's. Sodium and fluid restriction reviewed with the patient. 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    2. Hypertensive Urgency - Initial BP in the ED was 256/180. Started on IV NTG at that time which has now been weaned and he has been restarted on his PTA medications of Amlodipine  daily, Hydralazine  Q8H, Imdur  daily, Labetalol  BID, and Lisinopril  daily. He reports noncompliance with his medications as an outpatient due to the side-effect of a headache. Most likely culprit would be Imdur. Will discontinue this as he is likely to continue to be noncompliant in the outpatient setting if still having side effects.  - if additional BP control needed, can further titrate Labetalol or consider the addition of Aldactone.   3. Elevated Troponin/ Atypical Chest Pain - says his "heart was pounding" while in the ER with initial BP of 256/180. Symptoms have since resolved with improvement of his BP.  - troponin values flat at 0.10, 0.09, 0.07 and 0.05 likely secondary to demand ischemia in the setting of hypertensive urgency and acute CHF exacerbation. EKG without acute ischemic changes.  - repeat echocardiogram is pending to assess LV function and wall motion. If EF remains preserved and no WMA identified, would not pursue further ischemic evaluation at this time.    4. Stage 2 CKD - baseline creatinine 1.1 -  1.2. - at 1.37 on admission. Continue to follow with daily BMET while receiving IV diuresis.   Lorri Frederick, PA-C 11/18/2016, 5:49 PM Pager: [phone]  Patient seen, examined. Available data reviewed. Agree with findings, assessment, and plan as outlined by  Randall An, PA-C. Pt is obese male in NAD, JVP moderately elevated (difficult to assess), no bruits, CV RRR no murmur, lungs CTA, abdomen soft, obese, extremeities with 2+ edema.   Data reviewed: echo with normal LV systolic function but severe LVH noted. Minimal troponin elevation. BNP 384. CXR with interstitial edema, cardiomegaly.  Clinical exam, labs, and XRay findings consistent with acute on chronic diastolic heart failure related to uncontrolled hypertensive heart disease. Pt is medically noncompliant secondary to side effects of antihypertensives. Lifestyle also challenging (truck driver).   We discussed importance of medication adherence (will stop imdur to help with headaches but should stay on other medications). Discussed weight loss and sodium restriction. Tried to impress upon him the importance of lifestyle modification considering his young age. I'm not how receptive he is to adopting these changes.   Will increase furosemide to 80 mg IV BID and follow with you. thanks  Tonny Bollman, M.D. 11/18/2016 6:42 PM

## 2016-11-19 ENCOUNTER — Inpatient Hospital Stay (HOSPITAL_COMMUNITY): Payer: Medicaid Other

## 2016-11-19 DIAGNOSIS — G4730 Sleep apnea, unspecified: Secondary | ICD-10-CM

## 2016-11-19 DIAGNOSIS — R7303 Prediabetes: Secondary | ICD-10-CM | POA: Diagnosis present

## 2016-11-19 DIAGNOSIS — I509 Heart failure, unspecified: Secondary | ICD-10-CM

## 2016-11-19 LAB — ECHOCARDIOGRAM COMPLETE
HEIGHTINCHES: 74 in
Weight: 6373.94 oz

## 2016-11-19 LAB — BASIC METABOLIC PANEL
Anion gap: 12 (ref 5–15)
BUN: 16 mg/dL (ref 6–20)
CALCIUM: 8.8 mg/dL — AB (ref 8.9–10.3)
CO2: 26 mmol/L (ref 22–32)
CREATININE: 2 mg/dL — AB (ref 0.61–1.24)
Chloride: 100 mmol/L — ABNORMAL LOW (ref 101–111)
GFR calc non Af Amer: 43 mL/min — ABNORMAL LOW (ref >60)
GFR, EST AFRICAN AMERICAN: 50 mL/min — AB (ref >60)
Glucose, Bld: 131 mg/dL — ABNORMAL HIGH (ref 65–99)
Potassium: 4.4 mmol/L (ref 3.5–5.1)
SODIUM: 138 mmol/L (ref 135–145)

## 2016-11-19 LAB — HEMOGLOBIN A1C
HEMOGLOBIN A1C: 6 % — AB (ref 4.8–5.6)
Mean Plasma Glucose: 126 mg/dL

## 2016-11-19 LAB — MAGNESIUM: MAGNESIUM: 2 mg/dL (ref 1.7–2.4)

## 2016-11-19 MED ORDER — POTASSIUM CHLORIDE CRYS ER 20 MEQ PO TBCR: 40.0000 meq | EXTENDED_RELEASE_TABLET | Freq: Every day | ORAL | Status: DC

## 2016-11-19 MED ORDER — HYDRALAZINE HCL 50 MG PO TABS
50.0000 mg | ORAL_TABLET | Freq: Three times a day (TID) | ORAL | Status: DC
  Administered 2016-11-19 – 2016-11-20 (×3): 50 mg via ORAL
  Filled 2016-11-19 (×3): qty 1

## 2016-11-19 MED ORDER — PERFLUTREN LIPID MICROSPHERE
INTRAVENOUS | Status: AC
  Administered 2016-11-19: 3 mL via INTRAVENOUS
  Filled 2016-11-19: qty 10

## 2016-11-19 MED ORDER — AMLODIPINE BESYLATE 5 MG PO TABS
5.0000 mg | ORAL_TABLET | Freq: Every day | ORAL | Status: DC
  Administered 2016-11-19 – 2016-11-21 (×3): 5 mg via ORAL
  Filled 2016-11-19 (×3): qty 1

## 2016-11-19 MED ORDER — PERFLUTREN LIPID MICROSPHERE
1.0000 mL | INTRAVENOUS | Status: AC | PRN
  Administered 2016-11-19: 3 mL via INTRAVENOUS
  Filled 2016-11-19: qty 10

## 2016-11-19 MED ORDER — FUROSEMIDE 10 MG/ML IJ SOLN: 40.0000 mg | Freq: Every day | INTRAMUSCULAR | Status: DC

## 2016-11-19 NOTE — Progress Notes (Signed)
PROGRESS NOTE    Paul Lamb  TWK:[id number]  DOB: 03-31-1986  DOA: 11/17/2016 PCP: No PCP Per Patient Outpatient Specialists:   Hospital course: Paul Lamb is a 31 y.o. male with history of diastolic CHF, hypertension, sleep apnea and morbid obesity since to the ER with complaints of shortness of breath and chest pressure increasing over the last few days. Patient states he has not been compliant with his medications. Chest pressure was retrosternal associated with shortness of breath. Patient also has been having increased peripheral edema. Patient has not been taking some of his medications because patient states some of them causes headache.  Assessment & Plan: 1. Acute on chronic diastolic CHF - last EF measured in March 2017 was 55-60%. Patient received Lasix 100 mg in the ER and has been placed on 80 IV BID by cardiology. He has diuresed 4.3L since admission, weight is down to 400 lbs. Patient is off nitroglycerin infusion.  2-D echo pending. Closely follow intake output metabolic panel and daily weights.  Repeat CXR 3/31. 2. Hypertensive urgency - patient off the nitroglycerin infusion and restarted on home medications including labetalol and hydralazine lisinopril and amlodipine.  Monitor closely.  3. Sleep apnea on CPAP. 4. Chest discomfort most likely secondary to uncontrolled blood pressure and CHF - symptoms better now. 5. Prediabetes - as evidenced by A1c 6.0%.  Will ask for dietitian consultation.   DVT prophylaxis: Lovenox. Code Status: Full code.  Family Communication: Discussed with patient's family.  Disposition Plan: Home.  Consults called: None.  Admission status: Inpatient.   Subjective: Pt says he is feeling better today.  He is diuresing.  Less edema in legs.   Objective: Vitals:   11/18/16 1700 11/18/16 1925 11/19/16 0338 11/19/16 0340  BP: 134/72 136/73 (!) 98/48   Pulse: 96 100 86   Resp: (!) 32 (!) 28 (!) 26   Temp:  98.9 F (37.2 C) 97.9  F (36.6 C)   TempSrc:  Oral Axillary   SpO2: 93% 91% 97%   Weight:    (!) 181.8 kg (400 lb 12.7 oz)  Height:        Intake/Output Summary (Last 24 hours) at 11/19/16 0708 Last data filed at 11/18/16 1925  Gross per 24 hour  Intake          1056.38 ml  Output             2200 ml  Net         -1143.62 ml   Filed Weights   11/18/16 0101 11/18/16 0318 11/19/16 0340  Weight: (!) 186.6 kg (411 lb 4.8 oz) (!) 184.1 kg (405 lb 13.9 oz) (!) 181.8 kg (400 lb 12.7 oz)   Exam:  General exam: morbidly obese male, lying in bed, NAD. Cooperative.  Respiratory system:  No increased work of breathing. Cardiovascular system: S1 & S2 heard, RRR. Gastrointestinal system: Abdomen is nondistended, soft and nontender. Normal bowel sounds heard. Central nervous system: Alert and oriented. No focal neurological deficits. Extremities: no cyanosis. 2+ edema BLEs.  Data Reviewed: Basic Metabolic Panel:  Recent Labs Lab 11/17/16 2106 11/18/16 0043 11/18/16 1210 11/19/16 0244  NA 139  --   --  138  K 3.9  --   --  4.4  CL 104  --   --  100*  CO2 26  --   --  26  GLUCOSE 99  --   --  131*  BUN 15  --   --  16  CREATININE 1.37* 1.36*  --  2.00*  CALCIUM 9.2  --   --  8.8*  MG  --  1.6* 1.6* 2.0   Liver Function Tests: No results for input(s): AST, ALT, ALKPHOS, BILITOT, PROT, ALBUMIN in the last 168 hours. No results for input(s): LIPASE, AMYLASE in the last 168 hours. No results for input(s): AMMONIA in the last 168 hours. CBC:  Recent Labs Lab 11/17/16 2106 11/18/16 0043 11/18/16 0636  WBC 8.2 8.6 7.7  HGB 13.1 13.1 12.8*  HCT 38.6* 38.8* 38.0*  MCV 68.2* 67.7* 67.9*  PLT 368 353 335   Cardiac Enzymes:  Recent Labs Lab 11/18/16 0043 11/18/16 0636 11/18/16 1210  TROPONINI 0.09* 0.07* 0.05*   CBG (last 3)  No results for input(s): GLUCAP in the last 72 hours. Recent Results (from the past 240 hour(s))  MRSA PCR Screening     Status: None   Collection Time: 11/18/16  1:51  AM  Result Value Ref Range Status   MRSA by PCR NEGATIVE NEGATIVE Final    Comment:        The GeneXpert MRSA Assay (FDA approved for NASAL specimens only), is one component of a comprehensive MRSA colonization surveillance program. It is not intended to diagnose MRSA infection nor to guide or monitor treatment for MRSA infections.      Studies: Dg Chest 2 View  Result Date: 11/17/2016 CLINICAL DATA:  Acute onset of generalized chest pain, shortness of breath and cough. Headache. Initial encounter. EXAM: CHEST  2 VIEW COMPARISON:  Chest radiograph and CTA of the chest performed 11/01/2015 FINDINGS: The lungs are well-aerated. Vascular congestion is noted. Increased interstitial markings raise concern for pulmonary edema, though pneumonia could have a similar appearance. This is mildly worsened from the prior study. No pleural effusion or pneumothorax is seen. The heart is mildly enlarged. No acute osseous abnormalities are seen. IMPRESSION: Vascular congestion and mild cardiomegaly. Increased interstitial markings raise concern for pulmonary edema, though pneumonia could have a similar appearance. Electronically Signed   By: Roanna Raider M.D.   On: 11/17/2016 21:52   Scheduled Meds: . amLODipine  10 mg Oral Daily  . aspirin EC  81 mg Oral Daily  . enoxaparin (LOVENOX) injection  90 mg Subcutaneous Daily  . furosemide  80 mg Intravenous Q12H  . hydrALAZINE  100 mg Oral Q8H  . labetalol  200 mg Oral BID  . lisinopril  20 mg Oral Daily  . potassium chloride  40 mEq Oral Daily  . sodium chloride flush  3 mL Intravenous Q12H   Continuous Infusions: . nitroGLYCERIN Stopped (11/18/16 1657)    Principal Problem:   Acute on chronic diastolic CHF (congestive heart failure) (HCC) Active Problems:   Morbid obesity (HCC)   Sleep apnea   Hypertensive urgency   CHF (congestive heart failure) [facility])  Critical Care Time spent: 38 mins  Standley Dakins, MD, FAAFP [facility] Pager [phone]  If 7PM-7AM, please contact night-coverage [email or web address] Password [email or web address] 11/19/2016, 7:08 AM    LOS: 2 days

## 2016-11-19 NOTE — Progress Notes (Signed)
Progress Note  Patient Name: Paul Lamb Date of Encounter: 11/19/2016  Primary Cardiologist: Allyson Sabal  Subjective   Feeling much better. BP a little low overnight, but not symptomatic. No dyspnea, edema is gone. Creat has increased to 2.0   Inpatient Medications    Scheduled Meds: . amLODipine  5 mg Oral Daily  . aspirin EC  81 mg Oral Daily  . enoxaparin (LOVENOX) injection  90 mg Subcutaneous Daily  . [START ON 11/20/2016] furosemide  40 mg Intravenous Daily  . hydrALAZINE  50 mg Oral Q8H  . labetalol  200 mg Oral BID  . potassium chloride  40 mEq Oral Daily  . sodium chloride flush  3 mL Intravenous Q12H   Continuous Infusions:  PRN Meds: acetaminophen **OR** acetaminophen   Vital Signs    Vitals:   11/19/16 0700 11/19/16 0729 11/19/16 0933 11/19/16 1206  BP: 124/86 124/63 (!) 96/46 117/60  Pulse: 86 93 94 85  Resp: (!) 28 (!) Temp:  98.1 F (36.7 C) 98.4 F (36.9 C) 98.8 F (37.1 C)  TempSrc:  Oral Oral Oral  SpO2: 97% 93% 97% 97%  Weight:   (!) 180.7 kg (398 lb 5.9 oz)   Height:    (1.88 m)     Intake/Output Summary (Last 24 hours) at 11/19/16 1414 Last data filed at 11/19/16 1353  Gross per 24 hour  Intake          1416.38 ml  Output             1600 ml  Net          -183.62 ml   Filed Weights   11/18/16 0318 11/19/16 0340 11/19/16 0933  Weight: (!) 184.1 kg (405 lb 13.9 oz) (!) 181.8 kg (400 lb 12.7 oz) (!) 180.7 kg (398 lb 5.9 oz)    Telemetry    NSr - Personally Reviewed   Physical Exam  Comfortable, lying down GEN: No acute distress.   Neck: No JVD Cardiac: hyperdynamic precordium, ?RV heave, RRR, no murmurs, rubs, or gallops.  Respiratory: Clear to auscultation bilaterally. GI: Soft, nontender, non-distended  MS: No edema; No deformity. Neuro:  Nonfocal  Psych: Normal affect   Labs    Chemistry Recent Labs Lab 11/17/16 2106 11/18/16 0043 11/19/16 0244  NA 139  --  138  K 3.9  --  4.4  CL 104  --  100*    CO2 26  --  26  GLUCOSE 99  --  131*  BUN 15  --  16  CREATININE 1.37* 1.36* 2.00*  CALCIUM 9.2  --  8.8*  GFRNONAA >60 >60 43*  GFRAA >60 >60 50*  ANIONGAP 9  --  12     Hematology Recent Labs Lab 11/17/16 2106 11/18/16 0043 11/18/16 0636  WBC 8.2 8.6 7.7  RBC 5.66 5.73 5.60  HGB 13.1 13.1 12.8*  HCT 38.6* 38.8* 38.0*  MCV 68.2* 67.7* 67.9*  MCH 23.1* 22.9* 22.9*  MCHC 33.9 33.8 33.7  RDW 14.6 14.6 14.6  PLT 368 353 335    Cardiac Enzymes Recent Labs Lab 11/18/16 0043 11/18/16 0636 11/18/16 1210  TROPONINI 0.09* 0.07* 0.05*    Recent Labs Lab 11/17/16 2140  TROPIPOC 0.10*     BNP Recent Labs Lab 11/17/16 2106  BNP 384.3*     DDimer No results for input(s): DDIMER in the last 168 hours.   Radiology    Dg Chest 2 View  Result Date:  11/19/2016 CLINICAL DATA:  Shortness of breath.  CHF. EXAM: CHEST  2 VIEW COMPARISON:  November 17, 2016 FINDINGS: No pneumothorax. Cardiomegaly. Bilateral pulmonary opacities, right greater than left are mildly improved on the right in the interval. New somewhat platelike opacity seen in the left mid lung. No other interval changes. IMPRESSION: 1. Improving right greater than left pulmonary opacities may represent edema, especially given history. New somewhat platelike opacity in left mid lung suggestive of atelectasis. Recommend attention on follow-up. Electronically Signed   By: Gerome Sam III M.D   On: 11/19/2016 09:33   Dg Chest 2 View  Result Date: 11/17/2016 CLINICAL DATA:  Acute onset of generalized chest pain, shortness of breath and cough. Headache. Initial encounter. EXAM: CHEST  2 VIEW COMPARISON:  Chest radiograph and CTA of the chest performed 11/01/2015 FINDINGS: The lungs are well-aerated. Vascular congestion is noted. Increased interstitial markings raise concern for pulmonary edema, though pneumonia could have a similar appearance. This is mildly worsened from the prior study. No pleural effusion or  pneumothorax is seen. The heart is mildly enlarged. No acute osseous abnormalities are seen. IMPRESSION: Vascular congestion and mild cardiomegaly. Increased interstitial markings raise concern for pulmonary edema, though pneumonia could have a similar appearance. Electronically Signed   By: Roanna Raider M.D.   On: 11/17/2016 21:52    Cardiac Studies   Echocardiogram: 11/04/2015 Study Conclusions  - Left ventricle: The cavity size was normal. There was severe concentric hypertrophy. Systolic function was normal. The estimated ejection fraction was in the range of 55% to 60%. Wall motion was normal; there were no regional wall motion abnormalities. Left ventricular diastolic function parameters were normal. - Mitral valve: Transvalvular velocity was within the normal range. There was no evidence for stenosis. There was no regurgitation. - Right ventricle: The cavity size was mildly dilated. Wall thickness was normal. Systolic function was normal. - Tricuspid valve: There was trivial regurgitation. - Pulmonary arteries: Systolic pressure was moderately increased. PA peak pressure: 44 mm Hg (S). - Pericardium, extracardiac: A small pericardial effusion was identified circumferential to the heart. Features were not consistent with tamponade physiology.   Patient Profile     31 y.o. male with PMH of chronic diastolic CHF (EF 17-71% by echo in 10/2015), HTN, and OSA (on CPAP)  Assessment & Plan    1. Acute on Chronic Diastolic CHF - presented with worsening dyspnea with exertion and lower extremity edema for the past several weeks. Reports not taking Lasix for over a month and taking his other medications sporadically.  - echo in 11/04/2015 showed a preserved EF of 55-60% and "normal diastolic function" with no regional WMA. Repeat echo is pending. RV dilated. - initial BNP at 384 (likely falsely low secondary to obesity). CXR showing vascular congestion and mild  cardiomegaly with interstitial markings likely due to pulmonary edema with PNA not excluded. - diuresis slowed down today and creat rising. Stop diuretics.   2. Hypertensive Urgency - Initial BP in the ED was 256/180. Started on IV NTG at that time which has now been weaned and he has been restarted on his PTA medications of Amlodipine 10mg  daily, Hydralazine 100mg  Q8H, Imdur 60mg  daily, Labetalol 200mg  BID, and Lisinopril 20mg  daily. He reports noncompliance with his medications as an outpatient due to the side-effect of a headache. Most likely culprit would be Imdur. Will discontinue this as he is likely to continue to be noncompliant in the outpatient setting if still having side effects.  - BP much  lower now. Reduced hydralazine dose.  3. Elevated Troponin/ Atypical Chest Pain - awaiting echo to evaluate for regional wall motion abnormalities, but suspect demand ischemia due to severe HTN  4. Acute on chronic stage 2 CKD - baseline creatinine 1.1 - 1.2. - stop diuretics. Recheck BMET in AM  5. OSA on CPAP/superobesity: currently exam shows mostly RHF findings. Weight loss should be aggressively pursued, even bariatric surgery should be considered.  Signed, Thurmon Fair, MD  11/19/2016, 2:14 PM

## 2016-11-19 NOTE — Progress Notes (Signed)
Pt complaining of L ankle pain. Ankle not tender to touch, no edema, no erythema. Pt claims he did not roll ankle, pt does not remember hurting ankle. Pt states "I just woke up and it hurt." Pt claims ice pack did not help. Tylenol given. Pt instructed to inform physician during rounding in morning if ankle is still bothering him.

## 2016-11-19 NOTE — Plan of Care (Signed)
Problem: Food- and Nutrition-Related Knowledge Deficit (NB-1.1) Goal: Nutrition education Formal process to instruct or train a patient/client in a skill or to impart knowledge to help patients/clients voluntarily manage or modify food choices and eating behavior to maintain or improve health. Outcome: Adequate for Discharge  RD consulted for nutrition education regarding prediabetes. Also discussed weight loss and sodium intake  Lab Results  Component Value Date   HGBA1C 6.0 (H) 11/18/2016    RD provided handouts titled "Weight loss tips" and "Eat Right" handouts from the [employer] of [employer] as well as a copy of the "My Plate".   Went through dietary recall. Patient has a very difficult work schedule. 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 He works 70 hrs a week spread over 7 days a week. He is always in a different area. He says his old truck used to have a refrigerator where he could store packed meals. His new truck does not have a fridge.   Breakfast: Skips Lunch: Skips 2-3x a week. Otherwise, is fast food-[employer] and will order Mc-Chicken with fries.  Dinner: Usually salad from [place] Beverages: Mostly water, occasional regular soda, does drink sweetened green/peach tea (Snapple) Other: Does not exercise (trucks up [place] where it is "too cold" and he "doesn't want to get out of truck and walk around"  Prioritized talking about aspects that were easier to change.   Educated that elimination of his sugary beverages would have the greatest beneficial impact on his blood sugars, by far. He says he does not like the taste of unsweetened/diet products, which is common. Asked him to stick with them for 1-2 weeks and anticipate his taste will change.   He asked about drinking water, up to 100 oz a day. Given his history of HF, deferred approval of this to MD, but stated drinking ~64 oz of water a day should be very beneficial to his health if he is replacing the  unhealthier options.   Discussed importance of MOVEMENT. He is essentially not moving for >18 hrs a day if 8 hours of sleep is included. He seems to not be too motivated. RD recommended dumbbell with him in his truck to do exercises. He says when it gets warmer he will walk around the truck.   Encouraged him to push for getting a refrigerator in his truck and to stalk it with fruits, veggies, cheese sticks and other low sugar, high protein, high fiber foods. He should also then be more able to pack his meals and stop relying on fast food. Discussed how the fries in particular are a leading contributor to high BG  He does not eat whole grains. Discussed how these can help him better control his BG due to the fiber  Showed picture of the My Plate and the healthiest food group ratios at each meal with 25-50% fruits/veg, 25-50% protein and 25% starch/grains. Gave examples of meals that followed this template, even though they weren't "standard meals" ie sandwiches, protein bars etc  Because his morbid obesity also places him at risk for diabetes, we discussed the importance of weight loss for his overall health. Encouraged that following below recommendations will not only improve BG control, but also help him lose weight.   Pt is very young and strongly encouraged him to make changes to his diet/activity level now.  As health continues to decline, exercise and cooking healthier will become much harder/impossible and he will inevitably experience a significant loss of QOL  Summary of reccomendations  1. Reduce/eliminate sugary beverage consumption. Goal </= 1 per day 2. Reduce high starch intake (fried)-Follow Plate Method! 3. Be active! Is sedentary for 70 hrs a week 4. Switch to whole grains 5. Dont skip meals  Expect FAIR compliance. Undoubtedly, his career choice has really had a negative impact on his weight and overall health and makes healthy eating is extremely difficult.    Body mass  index is 51.15 kg/m. Pt meets criteria for Morbid obesity based on current BMI.  Paul Lamb RD, LDN, CNSC Clinical Nutrition Pager: [phone] 11/19/2016 11:09 AM

## 2016-11-19 NOTE — Progress Notes (Signed)
RT NOTE:  Pt will put CPAP on when he is ready to go to sleep. Pt understands to call RN to run O2 into CPAP. Pt will call if he has questions.

## 2016-11-19 NOTE — Progress Notes (Signed)
  Echocardiogram 2D Echocardiogram has been performed.  Delcie Roch 11/19/2016, 4:32 PM

## 2016-11-20 DIAGNOSIS — G4733 Obstructive sleep apnea (adult) (pediatric): Secondary | ICD-10-CM

## 2016-11-20 LAB — BASIC METABOLIC PANEL
ANION GAP: 12 (ref 5–15)
BUN: 21 mg/dL — AB (ref 6–20)
CALCIUM: 8.7 mg/dL — AB (ref 8.9–10.3)
CO2: 26 mmol/L (ref 22–32)
Chloride: 100 mmol/L — ABNORMAL LOW (ref 101–111)
Creatinine, Ser: 1.54 mg/dL — ABNORMAL HIGH (ref 0.61–1.24)
GFR calc Af Amer: >60 mL/min (ref >60)
GFR, EST NON AFRICAN AMERICAN: 60 mL/min — AB (ref >60)
GLUCOSE: 102 mg/dL — AB (ref 65–99)
Potassium: 4 mmol/L (ref 3.5–5.1)
Sodium: 138 mmol/L (ref 135–145)

## 2016-11-20 LAB — LIPID PANEL
Cholesterol: 229 mg/dL — ABNORMAL HIGH (ref 0–200)
HDL: 36 mg/dL — ABNORMAL LOW (ref >40)
LDL Cholesterol: 171 mg/dL — ABNORMAL HIGH (ref 0–99)
Total CHOL/HDL Ratio: 6.4 RATIO
Triglycerides: 110 mg/dL (ref <150)
VLDL: 22 mg/dL (ref 0–40)

## 2016-11-20 MED ORDER — HYDRALAZINE HCL 25 MG PO TABS
25.0000 mg | ORAL_TABLET | Freq: Three times a day (TID) | ORAL | Status: DC
  Administered 2016-11-20 – 2016-11-21 (×4): 25 mg via ORAL
  Filled 2016-11-20 (×4): qty 1

## 2016-11-20 MED ORDER — LISINOPRIL 20 MG PO TABS
20.0000 mg | ORAL_TABLET | Freq: Every day | ORAL | Status: DC
  Administered 2016-11-20 – 2016-11-21 (×2): 20 mg via ORAL
  Filled 2016-11-20 (×2): qty 1

## 2016-11-20 NOTE — Progress Notes (Signed)
PROGRESS NOTE    Paul Lamb  WCH:[id number]  DOB: 01-Jul-1986  DOA: 11/17/2016 PCP: No PCP Per Patient Outpatient Specialists:  Hospital course: Paul Lamb is a 31 y.o. male with history of diastolic CHF, hypertension, sleep apnea and morbid obesity since to the ER with complaints of shortness of breath and chest pressure increasing over the last few days. Patient states he has not been compliant with his medications. Chest pressure was retrosternal associated with shortness of breath. Patient also has been having increased peripheral edema. Patient has not been taking some of his medications because patient states some of them causes headache.  Assessment & Plan: 1. Acute on chronic diastolic CHF - last EF measured in March 2017 was 55-60%. Patient received Lasix 100 mg in the ER and has been placed on 80 IV BID by cardiology. He has diuresed 4.3L since admission, weight is down to 398 lbs which I suspect is his new dry weight. Patient is off nitroglycerin infusion.  2-D echo reviewed, he has depressed EF from 2017 study. Closely follow intake output metabolic panel and daily weights.  2. Hypertensive urgency - resolved now.  patient off the nitroglycerin infusion and restarted on home medications including labetalol and hydralazine lisinopril and amlodipine.  Monitor closely.  3. Essential hypertension- Pt on labetalol, hydralazine, amlodipine, (lisinopril on hold) due to AKI but hopefully can restart lisinopril soon.  4. Sleep apnea on CPAP. - Pt using CPAP nightly.  5. Chest discomfort most likely secondary to uncontrolled blood pressure and CHF - Resolved now.   6. Prediabetes - as evidenced by A1c 6.0%.  Dietitian consulted and has seen patient. I discussed with patient and he will discuss options with his PCP. He could possibly benefit from GLP-1 agonist therapy to help with glycemic control, appetite reduction and weight loss.    DVT prophylaxis: Lovenox. Code Status: Full  code.  Family Communication: Discussed with patient's family.  Disposition Plan: Home.  Consults called: cardiology Admission status: Inpatient.   Subjective: Pt says he is not diuresing as much as he had been.     Objective: Vitals:   11/19/16 1206 11/19/16 2025 11/20/16 0021 11/20/16 0454  BP: 117/60 (!) 108/49 (!) 102/47 129/74  Pulse: 85 96 87 96  Resp: 20 20 18 18   Temp: 98.8 F (37.1 C) 99.1 F (37.3 C) 98.5 F (36.9 C) 98.2 F (36.8 C)  TempSrc: Oral Oral Oral Oral  SpO2: 97% 95% 94% 96%  Weight:    (!) 180.7 kg (398 lb 4.8 oz)  Height:        Intake/Output Summary (Last 24 hours) at 11/20/16 0906 Last data filed at 11/20/16 0700  Gross per 24 hour  Intake             1080 ml  Output                0 ml  Net             1080 ml   Filed Weights   11/19/16 0340 11/19/16 0933 11/20/16 0454  Weight: (!) 181.8 kg (400 lb 12.7 oz) (!) 180.7 kg (398 lb 5.9 oz) (!) 180.7 kg (398 lb 4.8 oz)   Exam:  General exam: morbidly obese male, lying in bed, NAD. Cooperative.  Respiratory system:  No increased work of breathing. Cardiovascular system: S1 & S2 heard, RRR. Gastrointestinal system: Abdomen is nondistended, soft and nontender. Normal bowel sounds heard. Central nervous system: Alert and oriented. No focal neurological  deficits. Extremities: no cyanosis. 1+ edema BLEs.  Data Reviewed: Basic Metabolic Panel:  Recent Labs Lab 11/17/16 2106 11/18/16 0043 11/18/16 1210 11/19/16 0244 11/20/16 0442  NA 139  --   --  138 138  K 3.9  --   --  4.4 4.0  CL 104  --   --  100* 100*  CO2 26  --   --  26 26  GLUCOSE 99  --   --  131* 102*  BUN 15  --   --  16 21*  CREATININE 1.37* 1.36*  --  2.00* 1.54*  CALCIUM 9.2  --   --  8.8* 8.7*  MG  --  1.6* 1.6* 2.0  --    Liver Function Tests: No results for input(s): AST, ALT, ALKPHOS, BILITOT, PROT, ALBUMIN in the last 168 hours. No results for input(s): LIPASE, AMYLASE in the last 168 hours. No results for input(s):  AMMONIA in the last 168 hours. CBC:  Recent Labs Lab 11/17/16 2106 11/18/16 0043 11/18/16 0636  WBC 8.2 8.6 7.7  HGB 13.1 13.1 12.8*  HCT 38.6* 38.8* 38.0*  MCV 68.2* 67.7* 67.9*  PLT 368 353 335   Cardiac Enzymes:  Recent Labs Lab 11/18/16 0043 11/18/16 0636 11/18/16 1210  TROPONINI 0.09* 0.07* 0.05*   CBG (last 3)  No results for input(s): GLUCAP in the last 72 hours. Recent Results (from the past 240 hour(s))  MRSA PCR Screening     Status: None   Collection Time: 11/18/16  1:51 AM  Result Value Ref Range Status   MRSA by PCR NEGATIVE NEGATIVE Final    Comment:        The GeneXpert MRSA Assay (FDA approved for NASAL specimens only), is one component of a comprehensive MRSA colonization surveillance program. It is not intended to diagnose MRSA infection nor to guide or monitor treatment for MRSA infections.     Studies: Dg Chest 2 View  Result Date: 11/19/2016 CLINICAL DATA:  Shortness of breath.  CHF. EXAM: CHEST  2 VIEW COMPARISON:  November 17, 2016 FINDINGS: No pneumothorax. Cardiomegaly. Bilateral pulmonary opacities, right greater than left are mildly improved on the right in the interval. New somewhat platelike opacity seen in the left mid lung. No other interval changes. IMPRESSION: 1. Improving right greater than left pulmonary opacities may represent edema, especially given history. New somewhat platelike opacity in left mid lung suggestive of atelectasis. Recommend attention on follow-up. Electronically Signed   By: Gerome Sam III M.D   On: 11/19/2016 09:33   Scheduled Meds: . amLODipine  5 mg Oral Daily  . aspirin EC  81 mg Oral Daily  . enoxaparin (LOVENOX) injection  90 mg Subcutaneous Daily  . hydrALAZINE  50 mg Oral Q8H  . labetalol  200 mg Oral BID  . sodium chloride flush  3 mL Intravenous Q12H   Continuous Infusions:  Principal Problem:   Acute on chronic diastolic CHF (congestive heart failure) (HCC) Active Problems:   Morbid obesity  (HCC)   Sleep apnea   Hypertensive urgency   CHF (congestive heart failure) (HCC)   Prediabetes  Time spent: 22 mins  Standley Dakins, MD, FAAFP [facility] Pager [phone]  If 7PM-7AM, please contact night-coverage [email or web address] Password [email or web address] 11/20/2016, 9:06 AM    LOS: 3 days

## 2016-11-20 NOTE — Progress Notes (Signed)
Progress Note  Patient Name: Grandon Torsiello Berberich Date of Encounter: 11/20/2016  Primary Cardiologist: Allyson Sabal  Subjective   Feels that breathing is back to normal. Has only walked in the room, not in the hallway. Swelling is gone. Net diuresis 2.6 L. Weight decreased 13 pounds since admission. Good blood pressure control. Improving creatinine, down to 1.54.  Inpatient Medications    Scheduled Meds: . amLODipine  5 mg Oral Daily  . aspirin EC  81 mg Oral Daily  . enoxaparin (LOVENOX) injection  90 mg Subcutaneous Daily  . hydrALAZINE  50 mg Oral Q8H  . labetalol  200 mg Oral BID  . sodium chloride flush  3 mL Intravenous Q12H   Continuous Infusions:  PRN Meds: acetaminophen **OR** acetaminophen   Vital Signs    Vitals:   11/19/16 2025 11/20/16 0021 11/20/16 0454 11/20/16 0923  BP: (!) 108/49 (!) 102/47 129/74 116/61  Pulse: 96 87 96 95  Resp: 20 18 18    Temp: 99.1 F (37.3 C) 98.5 F (36.9 C) 98.2 F (36.8 C)   TempSrc: Oral Oral Oral   SpO2: 95% 94% 96%   Weight:   (!) 180.7 kg (398 lb 4.8 oz)   Height:        Intake/Output Summary (Last 24 hours) at 11/20/16 1148 Last data filed at 11/20/16 0924  Gross per 24 hour  Intake             1323 ml  Output                0 ml  Net             1323 ml   Filed Weights   11/19/16 0340 11/19/16 0933 11/20/16 0454  Weight: (!) 181.8 kg (400 lb 12.7 oz) (!) 180.7 kg (398 lb 5.9 oz) (!) 180.7 kg (398 lb 4.8 oz)    Telemetry    Sinus rhythm/mild sinus tachycardia - Personally Reviewed Physical Exam  Superobese GEN: No acute distress.   Neck:  unable to see JVD Cardiac: RRR, no murmurs, rubs, or gallops.  Respiratory: Clear to auscultation bilaterally. GI: Soft, nontender, non-distended  MS: No edema; No deformity. Neuro:  Nonfocal  Psych: Normal affect   Labs    Chemistry Recent Labs Lab 11/17/16 2106 11/18/16 0043 11/19/16 0244 11/20/16 0442  NA 139  --  138 138  K 3.9  --  4.4 4.0  CL 104  --  100*  100*  CO2 26  --  26 26  GLUCOSE 99  --  131* 102*  BUN 15  --  16 21*  CREATININE 1.37* 1.36* 2.00* 1.54*  CALCIUM 9.2  --  8.8* 8.7*  GFRNONAA >60 >60 43* 60*  GFRAA >60 >60 50* >60  ANIONGAP 9  --  12 12     Hematology Recent Labs Lab 11/17/16 2106 11/18/16 0043 11/18/16 0636  WBC 8.2 8.6 7.7  RBC 5.66 5.73 5.60  HGB 13.1 13.1 12.8*  HCT 38.6* 38.8* 38.0*  MCV 68.2* 67.7* 67.9*  MCH 23.1* 22.9* 22.9*  MCHC 33.9 33.8 33.7  RDW 14.6 14.6 14.6  PLT 368 353 335    Cardiac Enzymes Recent Labs Lab 11/18/16 0043 11/18/16 0636 11/18/16 1210  TROPONINI 0.09* 0.07* 0.05*    Recent Labs Lab 11/17/16 2140  TROPIPOC 0.10*     BNP Recent Labs Lab 11/17/16 2106  BNP 384.3*     DDimer No results for input(s): DDIMER in the last 168 hours.  Radiology    Dg Chest 2 View  Result Date: 11/19/2016 CLINICAL DATA:  Shortness of breath.  CHF. EXAM: CHEST  2 VIEW COMPARISON:  November 17, 2016 FINDINGS: No pneumothorax. Cardiomegaly. Bilateral pulmonary opacities, right greater than left are mildly improved on the right in the interval. New somewhat platelike opacity seen in the left mid lung. No other interval changes. IMPRESSION: 1. Improving right greater than left pulmonary opacities may represent edema, especially given history. New somewhat platelike opacity in left mid lung suggestive of atelectasis. Recommend attention on follow-up. Electronically Signed   By: Gerome Sam III M.D   On: 11/19/2016 09:33    Cardiac Studies   Echocardiogram: 11/04/2015 Study Conclusions  - Left ventricle: The cavity size was normal. There was severe concentric hypertrophy. Systolic function was normal. The estimated ejection fraction was in the range of 55% to 60%. Wall motion was normal; there were no regional wall motion abnormalities. Left ventricular diastolic function parameters were normal. - Mitral valve: Transvalvular velocity was within the normal  range. There was no evidence for stenosis. There was no regurgitation. - Right ventricle: The cavity size was mildly dilated. Wall thickness was normal. Systolic function was normal. - Tricuspid valve: There was trivial regurgitation. - Pulmonary arteries: Systolic pressure was moderately increased. PA peak pressure: 44 mm Hg (S). - Pericardium, extracardiac: A small pericardial effusion was identified circumferential to the heart. Features were not consistent with tamponade physiology.  Caveat: I have personally reviewed the images and I believe that LV wall motion is mildly and globally reduced (EF 50%).   Patient Profile     31 y.o. male chronic diastolic CHF (EF 16-10% by echo in 10/2015), HTN, and OSA (on CPAP), Stenting with acute heart failure exacerbation with biventricular manifestations in the setting of noncompliance with medications  Assessment & Plan      1. Acute on Chronic Diastolic CHF - presented with worsening dyspnea with exertion and lower extremity edema for the past several weeks. Reports not taking Lasix for over a month and taking his other medications sporadically.  - echo in 11/04/2015 showed a preserved EF of 55-60% and "normal diastolic function" with no regional WMA. Repeat echo Shows similar findings. RV dilated, PA pressure elevated consistent with cor pulmonale secondary to super obesity and obstructive sleep apnea. - initialBNP at 384 (likely falsely low secondary to obesity). CXR showing vascular congestion and mild cardiomegaly with interstitial markings likely due to pulmonary edema with PNA not excluded. - diuresis slowed down today and creat rising. Stop diuretics.  2. Hypertensive Urgency - Initial BP in the ED was 256/180. There was probably a component of rebound hypertension. BP much lower now, despite using substantially lower doses of medications. I think we can restart his lisinopril and gradually discontinued the hydralazine (which  will likely be associated with improved compliance with once daily rather than thrice daily dosing). Avoid nitrates which were probably causing the headache that made him stop his medications. The fact that his blood pressure is so much easier to control now suggests that he has volume dependent hypertension and with aggressive diuresis requires fewer medications.  3. Elevated Troponin/ Atypical Chest Pain - Echo reports inferior wall hypokinesis and a reduction in left ventricular systolic function. I have personally reviewed the images and have disagreements with this interpretation. I think the LV EF is around 50% and I do not think there is evidence of inferior wall hypokinesis. Left ventricular systolic function seems borderline globally reduced. With current  volatile renal function I would not recommend coronary angiography. This can be considered in the near future as renal function improves: Coronary CT angiography would have poor image quality, would  need invasive angiography. Reassess the need for this procedure as an outpatient.  4. Acute on chronic stage 2 CKD - baseline creatinine 1.1 - 1.2. - Improved today, creatinine 1.5, down from peak of 2.0. Restart ACE inhibitor. Restart low-dose oral diuretic.  5. OSA on CPAP/superobesity: currently exam shows mostly RHF findings. Weight loss should be aggressively pursued, even bariatric surgery should be considered. Needs 100% compliance with CPAP.   Signed, Thurmon Fair, MD  11/20/2016, 11:48 AM

## 2016-11-20 NOTE — Progress Notes (Signed)
Pt states he needs help switching his O2 over to his CPAP later tonight. Will call RT or RN when he is ready to wear it.

## 2016-11-21 DIAGNOSIS — I5023 Acute on chronic systolic (congestive) heart failure: Secondary | ICD-10-CM

## 2016-11-21 LAB — BASIC METABOLIC PANEL
Anion gap: 9 (ref 5–15)
BUN: 15 mg/dL (ref 6–20)
CALCIUM: 8.9 mg/dL (ref 8.9–10.3)
CO2: 27 mmol/L (ref 22–32)
CREATININE: 1.21 mg/dL (ref 0.61–1.24)
Chloride: 101 mmol/L (ref 101–111)
GFR calc non Af Amer: >60 mL/min (ref >60)
Glucose, Bld: 108 mg/dL — ABNORMAL HIGH (ref 65–99)
Potassium: 4.5 mmol/L (ref 3.5–5.1)
Sodium: 137 mmol/L (ref 135–145)

## 2016-11-21 LAB — CBC
HCT: 36 % — ABNORMAL LOW (ref 39.0–52.0)
Hemoglobin: 12.2 g/dL — ABNORMAL LOW (ref 13.0–17.0)
MCH: 23.1 pg — AB (ref 26.0–34.0)
MCHC: 33.9 g/dL (ref 30.0–36.0)
MCV: 68.2 fL — ABNORMAL LOW (ref 78.0–100.0)
PLATELETS: 343 10*3/uL (ref 150–400)
RBC: 5.28 MIL/uL (ref 4.22–5.81)
RDW: 15.2 % (ref 11.5–15.5)
WBC: 7.9 10*3/uL (ref 4.0–10.5)

## 2016-11-21 MED ORDER — ASPIRIN 81 MG PO TBEC: 81.0000 mg | DELAYED_RELEASE_TABLET | Freq: Every day | ORAL | Status: DC

## 2016-11-21 MED ORDER — AMLODIPINE BESYLATE 5 MG PO TABS: 5.0000 mg | ORAL_TABLET | Freq: Every day | ORAL | 0 refills | Status: DC

## 2016-11-21 MED ORDER — LABETALOL HCL 200 MG PO TABS: 200.0000 mg | ORAL_TABLET | Freq: Two times a day (BID) | ORAL | 0 refills | Status: DC

## 2016-11-21 MED ORDER — POTASSIUM CHLORIDE ER 10 MEQ PO TBCR: 10.0000 meq | EXTENDED_RELEASE_TABLET | Freq: Every day | ORAL | 0 refills | Status: DC

## 2016-11-21 MED ORDER — HYDRALAZINE HCL 25 MG PO TABS: 25.0000 mg | ORAL_TABLET | Freq: Three times a day (TID) | ORAL | 0 refills | Status: DC

## 2016-11-21 MED ORDER — FUROSEMIDE 40 MG PO TABS: 40.0000 mg | ORAL_TABLET | Freq: Every day | ORAL | 0 refills | Status: DC

## 2016-11-21 MED ORDER — LISINOPRIL 20 MG PO TABS: 20.0000 mg | ORAL_TABLET | Freq: Every day | ORAL | 0 refills | Status: DC

## 2016-11-21 MED ORDER — ATORVASTATIN CALCIUM 20 MG PO TABS: 20.0000 mg | ORAL_TABLET | Freq: Every day | ORAL | 0 refills | Status: DC

## 2016-11-21 NOTE — Progress Notes (Signed)
Pt has orders to be discharged. Discharge instructions given and pt has no additional questions at this time. Medication regimen reviewed and pt educated. Pt verbalized understanding and has no additional questions. Telemetry box removed. IV removed and site in good condition. Pt stable and waiting for transportation.   Yanilen Adamik RN 

## 2016-11-21 NOTE — Discharge Summary (Addendum)
Physician Discharge Summary  Paul Lamb AOZ:[id number] DOB: 1986-08-08 DOA: 11/17/2016  PCP: Paul Glatter, MD  Admit date: 11/17/2016 Discharge date: 11/21/2016  Admitted From: Home  Disposition:  Home   Recommendations for Outpatient Follow-up:  1. Follow up with PCP in 1 weeks 2. Follow up with cardiology as scheduled 3. Please assist with referral for gastric bypass evaluation 4. Please consider GLP-1 agonist therapy 5. Please obtain BMP/CBC in one week  Discharge Condition: STABLE  CODE STATUS: FULL  Diet recommendation: Heart Healthy / Carb Modified  Brief/Interim Summary: HPI: Paul Lamb is a 31 y.o. male with history of diastolic CHF, hypertension, sleep apnea and morbid obesity since to the ER with complaints of shortness of breath and chest pressure increasing over the last few days. Patient states he has not been compliant with his medications. Chest pressure was retrosternal associated with shortness of breath. Patient also has been having increased peripheral edema. Patient has not been taking some of his medications because patient states some of them causes headache.  ED Course: In the ER patient was found to have markedly elevated blood pressure with systolic blood pressure more than 220. Chest x-ray shows congestion consistent with CHF. On exam patient also has lower extremity edema. Patient was given Lasix 100 mg IV and IV nitroglycerin infusion was started. On exam patient appears nonfocal.  Assessment & Plan: 1. Acute on chronic diastolic CHF - last EF measured in March 2017 was 55-60%. Patient received Lasix 100 mg in the ER and has been placed on 80 IV BID by cardiology. He has diuresed 4.3L since admission, weight is down to 398 lbs which I suspect is his new dry weight. Patient is off nitroglycerin infusion.  2-D echo reviewed, he has depressed EF from 2017 study. Closely followed intake output metabolic panel and daily weights.  Discharge on lasix 40 mg  daily.  Kdur 10 meq daily.   2. Hypertensive urgency - resolved now.  patient off the nitroglycerin infusion and restarted on home medications including labetalol and hydralazine lisinopril and amlodipine.  Monitor closely outpatient.  Follow up arranged with cardiology and PCP.  3. Essential hypertension- Pt on labetalol, hydralazine, amlodipine,and lisinopril 4. Sleep apnea on CPAP. - Pt using CPAP nightly.  5. Chest discomfort most likely secondary to uncontrolled blood pressure and CHF - Resolved now.   6. Prediabetes - as evidenced by A1c 6.0%.  Dietitian consulted and has seen patient. I discussed with patient and he will discuss options with his PCP. He could possibly benefit from GLP-1 agonist therapy to help with glycemic control, appetite reduction and weight loss.   7. AKI secondary to diuresis - held lasix for 2 days and renal function normalized, resume home lasix 40 mg but reduce dose to once daily.  Follow up with PCP and cardiologist for further management.  Repeat BMP in 1 week with PCP and/or cardiologist.  8. Dyslipidemia - atorvastatin 20 mg daily. Follow up with PCP and cardiologist for recheck and titration of meds, etc.   DVT prophylaxis:Lovenox. Code Status:Full code. Family Communication:Discussed with patient's family. Disposition Plan:Home. Consults called:cardiology Admission status:Inpatient.    Discharge Diagnoses:  Principal Problem:   Acute on chronic diastolic CHF (congestive heart failure) (HCC) Active Problems:   Morbid obesity (HCC)   Sleep apnea   Hypertensive urgency   CHF (congestive heart failure) [facility])   Prediabetes    Discharge Instructions  Discharge Instructions    Increase activity slowly    Complete by:  As directed      Allergies as of 11/21/2016      Reactions   Penicillins Other (See Comments)      Medication List    STOP taking these medications   aspirin-acetaminophen-caffeine 250-250-65 MG tablet Commonly known  as:  EXCEDRIN MIGRAINE   isosorbide mononitrate 60 MG 24 hr tablet Commonly known as:  IMDUR   TYLENOL COLD/FLU SEVERE NIGHT PO     TAKE these medications   amLODipine 5 MG tablet Commonly known as:  NORVASC Take 1 tablet (5 mg total) by mouth daily. What changed:  medication strength  how much to take   aspirin 81 MG EC tablet Take 1 tablet (81 mg total) by mouth daily. Start taking on:  11/22/2016   atorvastatin 20 MG tablet Commonly known as:  LIPITOR Take 1 tablet (20 mg total) by mouth daily at 6 PM.   furosemide 40 MG tablet Commonly known as:  LASIX Take 1 tablet (40 mg total) by mouth daily. Must have office visit for refills What changed:  when to take this   hydrALAZINE 25 MG tablet Commonly known as:  APRESOLINE Take 1 tablet (25 mg total) by mouth 3 (three) times daily. What changed:  medication strength  how much to take  when to take this   labetalol 200 MG tablet Commonly known as:  NORMODYNE Take 1 tablet (200 mg total) by mouth 2 (two) times daily.   lisinopril 20 MG tablet Commonly known as:  PRINIVIL,ZESTRIL Take 1 tablet (20 mg total) by mouth daily.   potassium chloride 10 MEQ tablet Commonly known as:  K-DUR Take 1 tablet (10 mEq total) by mouth daily.      Follow-up Information    Paul Glatter, MD. Schedule an appointment as soon as possible for a visit in 1 week(s).   Specialty:  Internal Medicine Contact information: [address] [place] [address] [phone]        Paul Lamb, [place]. Go on 12/05/2016.   Specialties:  Cardiology, Radiology Why:  :30am for post cardiology follow up (Dr. Hazle Coca NP) Contact information: [address] [place] [address] [phone]          Allergies  Allergen Reactions  . Penicillins Other (See Comments)   Procedures/Studies: Dg Chest 2 View  Result Date: 11/19/2016 CLINICAL DATA:  Shortness of breath.  CHF. EXAM: CHEST  2 VIEW COMPARISON:   November 17, 2016 FINDINGS: No pneumothorax. Cardiomegaly. Bilateral pulmonary opacities, right greater than left are mildly improved on the right in the interval. New somewhat platelike opacity seen in the left mid lung. No other interval changes. IMPRESSION: 1. Improving right greater than left pulmonary opacities may represent edema, especially given history. New somewhat platelike opacity in left mid lung suggestive of atelectasis. Recommend attention on follow-up. Electronically Signed   By: Gerome Sam III M.D   On: 11/19/2016 09:33   Dg Chest 2 View  Result Date: 11/17/2016 CLINICAL DATA:  Acute onset of generalized chest pain, shortness of breath and cough. Headache. Initial encounter. EXAM: CHEST  2 VIEW COMPARISON:  Chest radiograph and CTA of the chest performed 11/01/2015 FINDINGS: The lungs are well-aerated. Vascular congestion is noted. Increased interstitial markings raise concern for pulmonary edema, though pneumonia could have a similar appearance. This is mildly worsened from the prior study. No pleural effusion or pneumothorax is seen. The heart is mildly enlarged. No acute osseous abnormalities are seen. IMPRESSION: Vascular congestion and mild cardiomegaly. Increased interstitial  markings raise concern for pulmonary edema, though pneumonia could have a similar appearance. Electronically Signed   By: Roanna Raider M.D.   On: 11/17/2016 21:52   Echocardiogram 10/2016 Study Conclusions  - Procedure narrative: Transthoracic echocardiography. Image   quality was poor. Intravenous contrast (Definity) was   administered. - Left ventricle: The cavity size was normal. Wall thickness was   increased in a pattern of moderate LVH. Systolic function was   mildly to moderately reduced. The estimated ejection fraction was   in the range of 40% to 45%. Inferior wall hypokinesis. The study   is not technically sufficient to allow evaluation of LV diastolic   function. - Left atrium: The  atrium was normal in size. - Tricuspid valve: There was mild regurgitation. - Pulmonary arteries: PA peak pressure: 37 mm Hg (S). - Inferior vena cava: The vessel was dilated. The respirophasic   diameter changes were blunted (< 50%), consistent with elevated   central venous pressure. - Pericardium, extracardiac: Small pericardial effusion. Study not   adequate to evaluate for tamponade physiology - clinical   correlation is advised.  Impressions:  - Compared to a prior study in 2017, the LVEF is lower at 40-45%   with new inferior hypokinesis.   Subjective: Pt says he feels much better.  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    Discharge Exam: Vitals:   11/20/16 1954 11/21/16 0715  BP: 126/67 122/63  Pulse: 93 90  Resp: 20 19  Temp: 99 F (37.2 C) 97.9 F (36.6 C)   Vitals:   11/20/16 0923 11/20/16 1507 11/20/16 1954 11/21/16 0715  BP: 116/61 (!) 114/54 126/67 122/63  Pulse: 95 97 93 90  Resp:  18 20 19   Temp:  98 F (36.7 C) 99 F (37.2 C) 97.9 F (36.6 C)  TempSrc:  Oral Oral Oral  SpO2:  97% 97% 97%  Weight:    (!) 180 kg (396 lb 14.4 oz)  Height:       General exam: morbidly obese male, lying in bed, NAD. Cooperative.  Respiratory system:  No increased work of breathing. Cardiovascular system: S1 & S2 heard, RRR. Gastrointestinal system: Abdomen is nondistended, soft and nontender. Normal bowel sounds heard. Central nervous system: Alert and oriented. No focal neurological deficits. Extremities: no cyanosis. 1+ edema BLEs.  The results of significant diagnostics from this hospitalization (including imaging, microbiology, ancillary and laboratory) are listed below for reference.     Microbiology: Recent Results (from the past 240 hour(s))  MRSA PCR Screening     Status: None   Collection Time: 11/18/16  1:51 AM  Result Value Ref Range Status   MRSA by PCR NEGATIVE NEGATIVE Final    Comment:        The GeneXpert MRSA Assay  (FDA approved for NASAL specimens only), is one component of a comprehensive MRSA colonization surveillance program. It is not intended to diagnose MRSA infection nor to guide or monitor treatment for MRSA infections.      Labs: BNP (last 3 results)  Recent Labs  11/17/16 2106  BNP 384.3*   Basic Metabolic Panel:  Recent Labs Lab 11/17/16 2106 11/18/16 0043 11/18/16 1210 11/19/16 0244 11/20/16 0442 11/21/16 0451  NA 139  --   --  138 138 137  K 3.9  --   --  4.4 4.0 4.5  CL 104  --   --  100* 100* 101  CO2 26  --   --  26 26 27   GLUCOSE 99  --   --  131* 102* 108*  BUN 15  --   --  16 21* 15  CREATININE 1.37* 1.36*  --  2.00* 1.54* 1.21  CALCIUM 9.2  --   --  8.8* 8.7* 8.9  MG  --  1.6* 1.6* 2.0  --   --    Liver Function Tests: No results for input(s): AST, ALT, ALKPHOS, BILITOT, PROT, ALBUMIN in the last 168 hours. No results for input(s): LIPASE, AMYLASE in the last 168 hours. No results for input(s): AMMONIA in the last 168 hours. CBC:  Recent Labs Lab 11/17/16 2106 11/18/16 0043 11/18/16 0636 11/21/16 0451  WBC 8.2 8.6 7.7 7.9  HGB 13.1 13.1 12.8* 12.2*  HCT 38.6* 38.8* 38.0* 36.0*  MCV 68.2* 67.7* 67.9* 68.2*  PLT 368 353 335 343   Cardiac Enzymes:  Recent Labs Lab 11/18/16 0043 11/18/16 0636 11/18/16 1210  TROPONINI 0.09* 0.07* 0.05*   BNP: Invalid input(s): POCBNP CBG: No results for input(s): GLUCAP in the last 168 hours. D-Dimer No results for input(s): DDIMER in the last 72 hours. Hgb A1c No results for input(s): HGBA1C in the last 72 hours. Lipid Profile  Recent Labs  11/20/16 0442  CHOL 229*  HDL 36*  LDLCALC 171*  TRIG 110  CHOLHDL 6.4   Thyroid function studies No results for input(s): TSH, T4TOTAL, T3FREE, THYROIDAB in the last 72 hours.  Invalid input(s): FREET3 Anemia work up No results for input(s): VITAMINB12, FOLATE, FERRITIN, TIBC, IRON, RETICCTPCT in the last 72 hours. Urinalysis    Component Value  Date/Time   COLORURINE AMBER (A) 11/01/2015 1449   APPEARANCEUR CLEAR 11/01/2015 1449   LABSPEC 1.040 (H) 11/01/2015 1449   PHURINE 6.5 11/01/2015 1449   GLUCOSEU NEGATIVE 11/01/2015 1449   HGBUR NEGATIVE 11/01/2015 1449   BILIRUBINUR NEGATIVE 11/01/2015 1449   KETONESUR NEGATIVE 11/01/2015 1449   PROTEINUR >300 (A) 11/01/2015 1449   NITRITE NEGATIVE 11/01/2015 1449   LEUKOCYTESUR NEGATIVE 11/01/2015 1449   Sepsis Labs Invalid input(s): PROCALCITONIN,  WBC,  LACTICIDVEN Microbiology Recent Results (from the past 240 hour(s))  MRSA PCR Screening     Status: None   Collection Time: 11/18/16  1:51 AM  Result Value Ref Range Status   MRSA by PCR NEGATIVE NEGATIVE Final    Comment:        The GeneXpert MRSA Assay (FDA approved for NASAL specimens only), is one component of a comprehensive MRSA colonization surveillance program. It is not intended to diagnose MRSA infection nor to guide or monitor treatment for MRSA infections.     Time coordinating discharge: 33  minutes  SIGNED:  Standley Dakins, MD  [facility] 11/21/2016, 12:53 PM Pager   If 7PM-7AM, please contact night-coverage [email or web address] Password [email or web address]

## 2016-11-21 NOTE — Progress Notes (Signed)
Progress Note  Patient Name: Paul Lamb Date of Encounter: 11/21/2016  Primary Cardiologist: Allyson Sabal  Subjective  No CP  No SOB    Inpatient Medications    Scheduled Meds: . amLODipine  5 mg Oral Daily  . aspirin EC  81 mg Oral Daily  . enoxaparin (LOVENOX) injection  90 mg Subcutaneous Daily  . hydrALAZINE  25 mg Oral Q8H  . labetalol  200 mg Oral BID  . lisinopril  20 mg Oral Daily  . sodium chloride flush  3 mL Intravenous Q12H   Continuous Infusions:  PRN Meds: acetaminophen **OR** acetaminophen   Vital Signs    Vitals:   11/20/16 0923 11/20/16 1507 11/20/16 1954 11/21/16 0715  BP: 116/61 (!) 114/54 126/67 122/63  Pulse: 95 97 93 90  Resp:  18 20 19   Temp:  98 F (36.7 C) 99 F (37.2 C) 97.9 F (36.6 C)  TempSrc:  Oral Oral Oral  SpO2:  97% 97% 97%  Weight:    (!) 396 lb 14.4 oz (180 kg)  Height:        Intake/Output Summary (Last 24 hours) at 11/21/16 0942 Last data filed at 11/20/16 1600  Gross per 24 hour  Intake              240 ml  Output              540 ml  Net             -300 ml   Filed Weights   11/19/16 0933 11/20/16 0454 11/21/16 0715  Weight: (!) 398 lb 5.9 oz (180.7 kg) (!) 398 lb 4.8 oz (180.7 kg) (!) 396 lb 14.4 oz (180 kg)    Telemetry    SR   - Personally Reviewed Physical Exam  Obese 31 yo in nAD   GEN: No acute distress.   Neck:  unable to see JVD Cardiac: RRR, no murmurs, rubs, or gallops.  Respiratory: Clear to auscultation bilaterally. GI: Soft, nontender, non-distended  MS: No edema; No deformity. Neuro:  Nonfocal  Psych: Normal affect   Labs    Chemistry  Recent Labs Lab 11/19/16 0244 11/20/16 0442 11/21/16 0451  NA 138 138 137  K 4.4 4.0 4.5  CL 100* 100* 101  CO2 26 26 27   GLUCOSE 131* 102* 108*  BUN 16 21* 15  CREATININE 2.00* 1.54* 1.21  CALCIUM 8.8* 8.7* 8.9  GFRNONAA 43* 60* >60  GFRAA 50* >60 >60  ANIONGAP 12 12 9      Hematology  Recent Labs Lab 11/18/16 0043 11/18/16 0636  11/21/16 0451  WBC 8.6 7.7 7.9  RBC 5.73 5.60 5.28  HGB 13.1 12.8* 12.2*  HCT 38.8* 38.0* 36.0*  MCV 67.7* 67.9* 68.2*  MCH 22.9* 22.9* 23.1*  MCHC 33.8 33.7 33.9  RDW 14.6 14.6 15.2  PLT 353 335 343    Cardiac Enzymes  Recent Labs Lab 11/18/16 0043 11/18/16 0636 11/18/16 1210  TROPONINI 0.09* 0.07* 0.05*     Recent Labs Lab 11/17/16 2140  TROPIPOC 0.10*     BNP  Recent Labs Lab 11/17/16 2106  BNP 384.3*     DDimer No results for input(s): DDIMER in the last 168 hours.   Radiology    No results found.  Cardiac Studies   Echocardiogram: 11/04/2015 Study Conclusions  - Left ventricle: The cavity size was normal. There was severe concentric hypertrophy. Systolic function was normal. The estimated ejection fraction was in the range of 55% to  60%. Wall motion was normal; there were no regional wall motion abnormalities. Left ventricular diastolic function parameters were normal. - Mitral valve: Transvalvular velocity was within the normal range. There was no evidence for stenosis. There was no regurgitation. - Right ventricle: The cavity size was mildly dilated. Wall thickness was normal. Systolic function was normal. - Tricuspid valve: There was trivial regurgitation. - Pulmonary arteries: Systolic pressure was moderately increased. PA peak pressure: 44 mm Hg (S). - Pericardium, extracardiac: A small pericardial effusion was identified circumferential to the heart. Features were not consistent with tamponade physiology.  Caveat: I have personally reviewed the images and I believe that LV wall motion is mildly and globally reduced (EF 50%).   Patient Profile     31 y.o. male chronic diastolic CHF (EF 16-10% by echo in 10/2015), HTN, and OSA (on CPAP) presented  acute heart failure exacerbation with biventricular manifestations in the setting of noncompliance with medications  Assessment & Plan      1. Acute on Chronic Diastolic  CHF Pt has diuresed since admit  VOlume overall OK I think he is OK to go home    2. Hypertensive Urgency  BP is much better on current meds  WOuld continue  He is tolerating   3. Elevated Troponin/ Atypical Chest Pain No symptoms  Probably demand in setting of volume overload and CHF    4. Acute on chronic stage 2 CKD - baseline creatinine 1.1 - 1.2. - Improved today, creatinine 1.5, down from peak of 2.0. Restart ACE inhibitor. Restart low-dose oral diuretic.  5. OSA on CPAP/superobesity:  Needs to lose weight  WOuld consider bariatric surgery if needed    Pt asked about disability  I do not think he needs this  OK to drive if taking meds   OK to go home  WIll make sure he has f/u   Signed, Dietrich Pates, MD  11/21/2016, 9:42 AM

## 2016-12-05 ENCOUNTER — Ambulatory Visit: Payer: Self-pay | Admitting: Physician Assistant

## 2016-12-06 ENCOUNTER — Ambulatory Visit (INDEPENDENT_AMBULATORY_CARE_PROVIDER_SITE_OTHER): Payer: BLUE CROSS/BLUE SHIELD | Admitting: Physician Assistant

## 2016-12-06 ENCOUNTER — Encounter: Payer: Self-pay | Admitting: Physician Assistant

## 2016-12-06 VITALS — BP 172/124 | HR 89 | Ht 74.0 in | Wt 398.0 lb

## 2016-12-06 DIAGNOSIS — I5032 Chronic diastolic (congestive) heart failure: Secondary | ICD-10-CM | POA: Diagnosis not present

## 2016-12-06 DIAGNOSIS — I10 Essential (primary) hypertension: Secondary | ICD-10-CM | POA: Diagnosis not present

## 2016-12-06 MED ORDER — HYDRALAZINE HCL 25 MG PO TABS: 50.0000 mg | ORAL_TABLET | Freq: Three times a day (TID) | ORAL | 0 refills | Status: DC

## 2016-12-06 NOTE — Progress Notes (Signed)
Cardiology Office Note   Date:  12/06/2016   ID:  Paul Lamb, DOB 05-Jun-1986, MRN [id number]  PCP:  Paul Glatter, MD  Cardiologist:  Dr Allyson Sabal 11/04/2015 in-hospital [place] Nordin, Bjorn Loser, PA-C    History of Present Illness: Paul Lamb is a 31 y.o. male with a history of D-CHF, HTN, OSA, morbid obesity, med noncompliance  Admit 03/29-04/09/2016 for HTN (256/180), CHF, wt 398 lbs at d/c. CP felt 2nd HTN/CHF, had AKI after diuresis w/ BUN/CR 16/2.00, 15/1.21 at d/c. EF prev nl, now 40-45%.   Paul Lamb presents for post-hospital follow up.   His BP is very high today, he just took his medication and has not eaten yet.  No problems with meds, tolerating them well.  Has weighed himself a couple of times since d/c, says he can do daily. He has been drinking mostly water and some milk. Only 1 soda since d/c. His weight on his home scales today was 386.  He has gone back to work, is looking into eating healthy. He is a Camera operator. He is not stopping as much, is getting frozen veg to eat, also eating salads. Has had HTN since the age of 63. He has LE edema every day, but is not waking with it. Denies PND, chronic orthopnea. Pt feels resp status is at baseline. He is willing to get support socks.    Past Medical History:  Diagnosis Date  . Acute diastolic (congestive) heart failure (HCC) 01/12/2016  . Hypertension     Past Surgical History:  Procedure Laterality Date  . NO PAST SURGERIES      Current Outpatient Prescriptions  Medication Sig Dispense Refill  . amLODipine (NORVASC) 5 MG tablet Take 1 tablet (5 mg total) by mouth daily. 30 tablet 0  . aspirin EC 81 MG EC tablet Take 1 tablet (81 mg total) by mouth daily.    Marland Kitchen atorvastatin (LIPITOR) 20 MG tablet Take 1 tablet (20 mg total) by mouth daily at 6 PM. 30 tablet 0  . furosemide (LASIX) 40 MG tablet Take 1 tablet (40 mg total) by mouth daily. Must have office visit for refills 30 tablet 0  .  hydrALAZINE (APRESOLINE) 25 MG tablet Take 1 tablet (25 mg total) by mouth 3 (three) times daily. 90 tablet 0  . labetalol (NORMODYNE) 200 MG tablet Take 1 tablet (200 mg total) by mouth 2 (two) times daily. 60 tablet 0  . lisinopril (PRINIVIL,ZESTRIL) 20 MG tablet Take 1 tablet (20 mg total) by mouth daily. 30 tablet 0  . potassium chloride (K-DUR) 10 MEQ tablet Take 1 tablet (10 mEq total) by mouth daily. 30 tablet 0   No current facility-administered medications for this visit.     Allergies:   Penicillins    Social History:  The patient  reports that he has never smoked. He has never used smokeless tobacco. He reports that he does not drink alcohol or use drugs.   Family History:  The patient's family history is not on file.    ROS:  Please see the history of present illness. All other systems are reviewed and negative.    PHYSICAL EXAM: BP improved to 142/100 on recheck VS:  BP (!) 172/124 (BP Location: Right Arm, Patient Position: Sitting, Cuff Size: Large)   Pulse 89   Ht  (1.88 m)   Wt (!) 398 lb (180.5 kg)   SpO2 99%   BMI 51.10 kg/m  , BMI Body mass index is  51.1 kg/m. GEN: Well nourished, well developed, male in no acute distress  HEENT: normal for age  Neck: minimal JVD, no carotid bruit, no masses Cardiac: RRR; no murmur, no rubs, or gallops Respiratory: decreased BS bases bilaterally, normal work of breathing GI: soft, nontender, nondistended, + BS MS: no deformity or atrophy; 1+ edema; distal pulses are 2+ in all 4 extremities   Skin: warm and dry, no rash Neuro:  Strength and sensation are intact Psych: euthymic mood, full affect   EKG:  EKG is not ordered today.  ECHO: 11/19/2016 - Procedure narrative: Transthoracic echocardiography. Image   quality was poor. Intravenous contrast (Definity) was administered. - Left ventricle: The cavity size was normal. Wall thickness was   increased in a pattern of moderate LVH. Systolic function was   mildly to  moderately reduced. The estimated ejection fraction was   in the range of 40% to 45%. Inferior wall hypokinesis. The study   is not technically sufficient to allow evaluation of LV diastolic function. - Left atrium: The atrium was normal in size. - Tricuspid valve: There was mild regurgitation. - Pulmonary arteries: PA peak pressure: 37 mm Hg (S). - Inferior vena cava: The vessel was dilated. The respirophasic   diameter changes were blunted (< 50%), consistent with elevated   central venous pressure. - Pericardium, extracardiac: Small pericardial effusion. Study not   adequate to evaluate for tamponade physiology - clinical   correlation is advised. Impressions: - Compared to a prior study in 2017, the LVEF is lower at 40-45%   with new inferior hypokinesis. Caveat: I have personally reviewed the images and I believe that LV wall motion is mildly and globally reduced (EF 50%).  (Dr Tenny Craw)  Recent Labs: 11/17/2016: B Natriuretic Peptide 384.3 11/18/2016: TSH 4.038 11/19/2016: Magnesium 2.0 11/21/2016: BUN 15; Creatinine, Ser 1.21; Hemoglobin 12.2; Platelets 343; Potassium 4.5; Sodium 137    Lipid Panel    Component Value Date/Time   CHOL 229 (H) 11/20/2016 0442   TRIG 110 11/20/2016 0442   HDL 36 (L) 11/20/2016 0442   CHOLHDL 6.4 11/20/2016 0442   VLDL 22 11/20/2016 0442   LDLCALC 171 (H) 11/20/2016 0442     Wt Readings from Last 3 Encounters:  12/06/16 (!) 398 lb (180.5 kg)  11/21/16 (!) 396 lb 14.4 oz (180 kg)  01/12/16 (!) 404 lb (183.3 kg)     Other studies Reviewed: Additional studies/ records that were reviewed today include: office notes, hospital records and testing.  ASSESSMENT AND PLAN:  1.  Chronic diastolic CHF: Pt weight is stable on his home scales, he is paying much more attention to what he is eating now. He is compliant with his medications, no significant side effects. He is encouraged to continue the current plan and keep working on reducing his sodium  intake and monitoring his weight. Check BMET today.   2. HTN: His BP improved after his meds kicked in, but is still too high. Will increase hydralazine to 50 mg tid. Continue other rx.   Current medicines are reviewed at length with the patient today.  The patient does not have concerns regarding medicines.  The following changes have been made:  Increase hydralazine  Labs/ tests ordered today include:   Orders Placed This Encounter  Procedures  . Basic metabolic panel     Disposition:   FU with Dr Allyson Sabal or myself in a month.  Tawny Asal  12/06/2016 2:21 PM    [facility]  Phone: [phone]; Fax: [phone]  This note was written with the assistance of speech recognition software. Please excuse any transcriptional errors.

## 2016-12-06 NOTE — Patient Instructions (Signed)
Medication Instructions:  INCREASE HYDRALAZINE 50 MG THREE TIMES DAILY   If you need a refill on your cardiac medications before your next appointment, please call your pharmacy.  Labwork: BMP TODAY AT [facility] ON THE 1ST FLOOR  Follow-Up: Your physician wants you to follow-up in: 1 MONTH WITH DR BERRY OR RHONDA.    Thank you for choosing [facility] at [facility]!!    RHONDA BARRETT, Franchot Gallo, LPN

## 2016-12-07 ENCOUNTER — Telehealth: Payer: Self-pay

## 2016-12-07 NOTE — Telephone Encounter (Signed)
sent to scheduling to arrange.

## 2016-12-07 NOTE — Telephone Encounter (Signed)
LM2CB PER RHONDA PT NEEDS AN APPT WITH HTN CLINIC IN A MONTH, ~ 01-04-2017

## 2016-12-08 ENCOUNTER — Telehealth: Payer: Self-pay | Admitting: Cardiovascular Disease

## 2016-12-08 NOTE — Telephone Encounter (Signed)
Called the patient and left a VM to call me back to scheduled a hypertension clinic appointment.

## 2016-12-13 ENCOUNTER — Telehealth: Payer: Self-pay | Admitting: *Deleted

## 2016-12-13 NOTE — Telephone Encounter (Signed)
Called the patient and left a VM to call me back to schedule a hypertension clinic appt in 1 month.

## 2016-12-15 NOTE — Telephone Encounter (Signed)
Appt scheduled for 6/13

## 2016-12-19 ENCOUNTER — Telehealth: Payer: Self-pay | Admitting: Cardiovascular Disease

## 2016-12-19 NOTE — Telephone Encounter (Signed)
Called patient to schedule hypertension clinic appointment for the middle of May.  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

## 2017-01-02 ENCOUNTER — Ambulatory Visit: Payer: Self-pay | Admitting: Physician Assistant

## 2017-01-05 ENCOUNTER — Encounter: Payer: Self-pay | Admitting: Internal Medicine

## 2017-02-01 ENCOUNTER — Ambulatory Visit: Payer: BLUE CROSS/BLUE SHIELD | Admitting: Cardiovascular Disease

## 2017-02-03 ENCOUNTER — Ambulatory Visit: Payer: Self-pay | Admitting: Internal Medicine

## 2017-02-27 MED FILL — LISINOPRIL 20 MG TABLET: 20 | 30 days supply | Qty: 30 | Fill #0

## 2017-02-27 MED FILL — hydrALAZINE HCL 25 MG TABS: 25 | 30 days supply | Qty: 180 | Fill #0

## 2017-02-28 ENCOUNTER — Other Ambulatory Visit: Payer: Self-pay | Admitting: Cardiovascular Disease

## 2017-02-28 NOTE — Telephone Encounter (Signed)
New message    *STAT* If patient is at the pharmacy, call can be transferred to refill team.   1. Which medications need to be refilled? (please list name of each medication and dose if known) amLODipine (NORVASC) 5 MG tablet, furosemide (LASIX) 40 MG tablet potassium chloride (K-DUR) 10 MEQ tabletlabetalol (NORMODYNE) 200 MG tabletlisinopril (PRINIVIL,ZESTRIL) 20 MG tablet  2. Which pharmacy/location (including street and city if local pharmacy) is medication to be sent to? [facility]  3. Do they need a 30 day or 90 day supply? APPT scheduled for August 1

## 2017-03-01 MED ORDER — LISINOPRIL 20 MG PO TABS: 20.0000 mg | ORAL_TABLET | Freq: Every day | ORAL | 0 refills | Status: DC

## 2017-03-01 MED ORDER — LABETALOL HCL 200 MG PO TABS: 200.0000 mg | ORAL_TABLET | Freq: Two times a day (BID) | ORAL | 0 refills | Status: DC

## 2017-03-01 MED ORDER — AMLODIPINE BESYLATE 5 MG PO TABS: 5.0000 mg | ORAL_TABLET | Freq: Every day | ORAL | 0 refills | Status: DC

## 2017-03-01 MED ORDER — POTASSIUM CHLORIDE ER 10 MEQ PO TBCR: 10.0000 meq | EXTENDED_RELEASE_TABLET | Freq: Every day | ORAL | 0 refills | Status: DC

## 2017-03-01 MED ORDER — FUROSEMIDE 40 MG PO TABS: 40.0000 mg | ORAL_TABLET | Freq: Every day | ORAL | 0 refills | Status: DC

## 2017-03-01 MED FILL — POTASSIUM CL 10 MEQ TAB SA: 10 | 30 days supply | Qty: 30 | Fill #0

## 2017-03-01 MED FILL — FUROSEMIDE 40 MG TABLET: 40 | 30 days supply | Qty: 30 | Fill #0

## 2017-03-01 MED FILL — AMLODIPINE BESYLATE 5 MG TA: 5 | 30 days supply | Qty: 30 | Fill #0

## 2017-03-01 MED FILL — LABETALOL HCL 200 MG TABLET: 200 | 30 days supply | Qty: 60 | Fill #0

## 2017-03-01 NOTE — Telephone Encounter (Signed)
Patient notified directly that rx's were sent to the pharmacy.

## 2017-03-22 ENCOUNTER — Encounter: Payer: Self-pay | Admitting: Physician Assistant

## 2017-03-22 ENCOUNTER — Encounter: Payer: Self-pay | Admitting: *Deleted

## 2017-03-22 ENCOUNTER — Ambulatory Visit: Payer: Medicaid Other | Admitting: Physician Assistant

## 2017-03-22 NOTE — Progress Notes (Deleted)
Cardiology Office Note   Date:  03/22/2017   ID:  Paul Lamb, DOB 24-Jul-1986, MRN [id number]  PCP:  Pete Glatter, MD  Cardiologist:  Dr. Allyson Sabal, 11/04/2015 in hospital  Barrett, Bjorn Loser, PA-C   No chief complaint on file.   History of Present Illness: Paul Lamb is a 31 y.o. male with a history of S-D-CHF, HTN, OSA, morbid obesity, med noncompliance, EF 40-45 percent by echo when he was admitted for CHF March 2018  12/06/2016 office visit, weight was 398 on our scales but 386 on his home scales. Better compliance with heart healthy low sodium diet, hydralazine increased  Paul Lamb presents for ***   Past Medical History:  Diagnosis Date  . Acute combined systolic and diastolic CHF, NYHA class 3 (HCC) 11/18/2016   EF now 40-45% by echo  . Acute diastolic (congestive) heart failure (HCC) 01/12/2016  . Hypertension   . Morbid obesity (HCC)   . OSA (obstructive sleep apnea)     Past Surgical History:  Procedure Laterality Date  . NO PAST SURGERIES      Current Outpatient Prescriptions  Medication Sig Dispense Refill  . amLODipine (NORVASC) 5 MG tablet Take 1 tablet (5 mg total) by mouth daily. 30 tablet 0  . aspirin EC 81 MG EC tablet Take 1 tablet (81 mg total) by mouth daily.    Marland Kitchen atorvastatin (LIPITOR) 20 MG tablet Take 1 tablet (20 mg total) by mouth daily at 6 PM. 30 tablet 0  . furosemide (LASIX) 40 MG tablet Take 1 tablet (40 mg total) by mouth daily. Must have office visit for refills 30 tablet 0  . hydrALAZINE (APRESOLINE) 25 MG tablet Take 2 tablets (50 mg total) by mouth 3 (three) times daily. 180 tablet 0  . labetalol (NORMODYNE) 200 MG tablet Take 1 tablet (200 mg total) by mouth 2 (two) times daily. 60 tablet 0  . lisinopril (PRINIVIL,ZESTRIL) 20 MG tablet Take 1 tablet (20 mg total) by mouth daily. 30 tablet 0  . potassium chloride (K-DUR) 10 MEQ tablet Take 1 tablet (10 mEq total) by mouth daily. 30 tablet 0   No current  facility-administered medications for this visit.     Allergies:   Penicillins    Social History:  The patient  reports that he has never smoked. He has never used smokeless tobacco. He reports that he does not drink alcohol or use drugs.   Family History:  The patient's family history includes Diabetes Mellitus II in his unknown relative; Hypertension in his unknown relative.    ROS:  Please see the history of present illness. All other systems are reviewed and negative.    PHYSICAL EXAM: VS:  There were no vitals taken for this visit. , BMI There is no height or weight on file to calculate BMI. GEN: Well nourished, well developed, male in no acute distress  HEENT: normal for age  Neck: no JVD, no carotid bruit, no masses Cardiac: RRR; no murmur, no rubs, or gallops Respiratory:  clear to auscultation bilaterally, normal work of breathing GI: soft, nontender, nondistended, + BS MS: no deformity or atrophy; no edema; distal pulses are 2+ in all 4 extremities   Skin: warm and dry, no rash Neuro:  Strength and sensation are intact Psych: euthymic mood, full affect   EKG:  EKG {ACTION; IS/IS [id number]} ordered today. The ekg ordered today demonstrates ***   Recent Labs: 11/17/2016: B Natriuretic Peptide 384.3 11/18/2016: TSH 4.038 11/19/2016: Magnesium  2.0 11/21/2016: BUN 15; Creatinine, Ser 1.21; Hemoglobin 12.2; Platelets 343; Potassium 4.5; Sodium 137    Lipid Panel    Component Value Date/Time   CHOL 229 (H) 11/20/2016 0442   TRIG 110 11/20/2016 0442   HDL 36 (L) 11/20/2016 0442   CHOLHDL 6.4 11/20/2016 0442   VLDL 22 11/20/2016 0442   LDLCALC 171 (H) 11/20/2016 0442     Wt Readings from Last 3 Encounters:  12/06/16 (!) 398 lb (180.5 kg)  11/21/16 (!) 396 lb 14.4 oz (180 kg)  01/12/16 (!) 404 lb (183.3 kg)     Other studies Reviewed: Additional studies/ records that were reviewed today include: ***.  ASSESSMENT AND PLAN:  1.  ***   Current medicines are  reviewed at length with the patient today.  The patient {ACTIONS; HAS/DOES NOT HAVE:19233} concerns regarding medicines.  The following changes have been made:  {PLAN; NO CHANGE:13088:s}  Labs/ tests ordered today include: *** No orders of the defined types were placed in this encounter.    Disposition:   FU with Dr. Allyson Sabal  Signed, Paul Lamb  03/22/2017 8:35 AM    [facility] Phone: [phone]; Fax: [phone]  This note was written with the assistance of speech recognition software. Please excuse any transcriptional errors.

## 2017-05-19 ENCOUNTER — Ambulatory Visit: Payer: Self-pay | Attending: Internal Medicine | Admitting: Internal Medicine

## 2017-05-19 ENCOUNTER — Encounter: Payer: Self-pay | Admitting: Internal Medicine

## 2017-05-19 VITALS — BP 160/110 | HR 97 | Temp 98.9°F | Resp 16 | Wt >= 6400 oz

## 2017-05-19 DIAGNOSIS — Z9989 Dependence on other enabling machines and devices: Secondary | ICD-10-CM

## 2017-05-19 DIAGNOSIS — I110 Hypertensive heart disease with heart failure: Secondary | ICD-10-CM | POA: Insufficient documentation

## 2017-05-19 DIAGNOSIS — I10 Essential (primary) hypertension: Secondary | ICD-10-CM

## 2017-05-19 DIAGNOSIS — G4733 Obstructive sleep apnea (adult) (pediatric): Secondary | ICD-10-CM | POA: Insufficient documentation

## 2017-05-19 DIAGNOSIS — F329 Major depressive disorder, single episode, unspecified: Secondary | ICD-10-CM | POA: Insufficient documentation

## 2017-05-19 DIAGNOSIS — N529 Male erectile dysfunction, unspecified: Secondary | ICD-10-CM | POA: Insufficient documentation

## 2017-05-19 DIAGNOSIS — I5032 Chronic diastolic (congestive) heart failure: Secondary | ICD-10-CM | POA: Insufficient documentation

## 2017-05-19 DIAGNOSIS — J811 Chronic pulmonary edema: Secondary | ICD-10-CM | POA: Insufficient documentation

## 2017-05-19 DIAGNOSIS — E119 Type 2 diabetes mellitus without complications: Secondary | ICD-10-CM | POA: Insufficient documentation

## 2017-05-19 DIAGNOSIS — I160 Hypertensive urgency: Secondary | ICD-10-CM | POA: Insufficient documentation

## 2017-05-19 DIAGNOSIS — F32A Depression, unspecified: Secondary | ICD-10-CM | POA: Insufficient documentation

## 2017-05-19 MED ORDER — SILDENAFIL CITRATE 100 MG PO TABS: ORAL_TABLET | ORAL | 6 refills | Status: DC

## 2017-05-19 MED ORDER — LISINOPRIL 20 MG PO TABS: 20.0000 mg | ORAL_TABLET | Freq: Every day | ORAL | 6 refills | Status: DC

## 2017-05-19 MED ORDER — POTASSIUM CHLORIDE ER 10 MEQ PO TBCR: 10.0000 meq | EXTENDED_RELEASE_TABLET | Freq: Every day | ORAL | 6 refills | Status: DC

## 2017-05-19 MED ORDER — AMLODIPINE BESYLATE 10 MG PO TABS: 10.0000 mg | ORAL_TABLET | Freq: Every day | ORAL | 6 refills | Status: DC

## 2017-05-19 MED ORDER — LABETALOL HCL 200 MG PO TABS: 200.0000 mg | ORAL_TABLET | Freq: Two times a day (BID) | ORAL | 6 refills | Status: DC

## 2017-05-19 MED ORDER — ATORVASTATIN CALCIUM 20 MG PO TABS: 20.0000 mg | ORAL_TABLET | Freq: Every day | ORAL | 6 refills | Status: DC

## 2017-05-19 MED ORDER — HYDRALAZINE HCL 25 MG PO TABS: 50.0000 mg | ORAL_TABLET | Freq: Three times a day (TID) | ORAL | 6 refills | Status: DC

## 2017-05-19 MED ORDER — FUROSEMIDE 40 MG PO TABS: 80.0000 mg | ORAL_TABLET | Freq: Every day | ORAL | 6 refills | Status: DC

## 2017-05-19 MED ORDER — METFORMIN HCL 500 MG PO TABS: 500.0000 mg | ORAL_TABLET | Freq: Two times a day (BID) | ORAL | 3 refills | Status: DC

## 2017-05-19 MED ORDER — SERTRALINE HCL 50 MG PO TABS: ORAL_TABLET | ORAL | 3 refills | Status: DC

## 2017-05-19 MED FILL — SERTRALINE HCL 50 MG TABLET: 50 | 30 days supply | Qty: 30 | Fill #0

## 2017-05-19 MED FILL — ?METFORMIN HCL 500MG TABLET: 500 | 30 days supply | Qty: 60 | Fill #0

## 2017-05-19 MED FILL — hydrALAZINE HCL 25 MG TABS: 25 | 30 days supply | Qty: 180 | Fill #0

## 2017-05-19 MED FILL — POTASSIUM CL 10 MEQ TAB SA: 10 | 30 days supply | Qty: 30 | Fill #0

## 2017-05-19 MED FILL — AMLODIPINE BESYLATE 10 MG T: 10 | 30 days supply | Qty: 30 | Fill #0

## 2017-05-19 MED FILL — ?ATORVASTATIN 20 MG TABLET: 20 | 30 days supply | Qty: 30 | Fill #0

## 2017-05-19 MED FILL — LABETALOL HCL 200 MG TABLET: 200 | 30 days supply | Qty: 60 | Fill #0

## 2017-05-19 MED FILL — FUROSEMIDE 40 MG TABLET: 40 | 30 days supply | Qty: 60 | Fill #0

## 2017-05-19 MED FILL — !VIAGRA 100MG TABLET: 100 | 30 days supply | Qty: 3 | Fill #0

## 2017-05-19 MED FILL — LISINOPRIL 20 MG TABLET: 20 | 30 days supply | Qty: 30 | Fill #0

## 2017-05-19 NOTE — Patient Instructions (Addendum)
Given forms for [facility] discount/[facility]. Give patient appointment with jasmine, next available.   Increase Amlodipine to 10 mg daily.  Start Zoloft for depression.  Use Viagra as needed for erectile dysfunction.    Follow a Healthy Eating Plan - You can do it! Limit sugary drinks.  Avoid sodas, sweet tea, sport or energy drinks, or fruit drinks.  Drink water, lo-fat milk, or diet drinks. Limit snack foods.   Cut back on candy, cake, cookies, chips, ice cream.  These are a special treat, only in small amounts. Eat plenty of vegetables.  Especially dark green, red, and orange vegetables. Aim for at least 3 servings a day. More is better! Include fruit in your daily diet.  Whole fruit is much healthier than fruit juice! Limit "white" bread, "white" pasta, "white" rice.   Choose "100% whole grain" products, brown or wild rice. Avoid fatty meats. Try "Meatless Monday" and choose eggs or beans one day a week.  When eating meat, choose lean meats like chicken, [place], and fish.  Grill, broil, or bake meats instead of frying, and eat poultry without the skin. Eat less salt.  Avoid frozen pizzas, frozen dinners and salty foods.  Use seasonings other than salt in cooking.  This can help blood pressure and keep you from swelling Beer, wine and liquor have calories.  If you can safely drink alcohol, limit to 1 drink per day for women, 2 drinks for men   Diabetes Mellitus and Standards of Medical Care Managing diabetes (diabetes mellitus) can be complicated. Your diabetes treatment may be managed by a team of health care providers, including:  A diet and nutrition specialist (registered dietitian).  A nurse.  A certified diabetes educator (CDE).  A diabetes specialist (endocrinologist).  An eye doctor.  A primary care provider.  A dentist.  Your health care providers follow a schedule in order to help you get the best quality of care. The following schedule is a general guideline for your  diabetes management plan. Your health care providers may also give you more specific instructions. HbA1c ( hemoglobin A1c) test This test provides information about blood sugar (glucose) control over the previous 2-3 months. It is used to check whether your diabetes management plan needs to be adjusted.  If you are meeting your treatment goals, this test is done at least 2 times a year.  If you are not meeting treatment goals or if your treatment goals have changed, this test is done 4 times a year.  Blood pressure test  This test is done at every routine medical visit. For most people, the goal is less than 130/80. Ask your health care provider what your goal blood pressure should be. Dental and eye exams  Visit your dentist two times a year.  If you have type 1 diabetes, get an eye exam 3-5 years after you are diagnosed, and then once a year after your first exam. ? If you were diagnosed with type 1 diabetes as a child, get an eye exam when you are age 19 or older and have had diabetes for 3-5 years. After the first exam, you should get an eye exam once a year.  If you have type 2 diabetes, have an eye exam as soon as you are diagnosed, and then once a year after your first exam. Foot care exam  Visual foot exams are done at every routine medical visit. The exams check for cuts, bruises, redness, blisters, sores, or other problems with the feet.  A complete foot exam is done by your health care provider once a year. This exam includes an inspection of the structure and skin of your feet, and a check of the pulses and sensation in your feet. ? Type 1 diabetes: Get your first exam 3-5 years after diagnosis. ? Type 2 diabetes: Get your first exam as soon as you are diagnosed.  Check your feet every day for cuts, bruises, redness, blisters, or sores. If you have any of these or other problems that are not healing, contact your health care provider. Kidney function test ( urine  microalbumin)  This test is done once a year. ? Type 1 diabetes: Get your first test 5 years after diagnosis. ? Type 2 diabetes: Get your first test as soon as you are diagnosed.  If you have chronic kidney disease (CKD), get a serum creatinine and estimated glomerular filtration rate (eGFR) test once a year. Lipid profile (cholesterol, HDL, LDL, triglycerides)  This test should be done when you are diagnosed with diabetes, and every 5 years after the first test. If you are on medicines to lower your cholesterol, you may need to get this test done every year. ? The goal for LDL is less than 100 mg/dL (5.5 mmol/L). If you are at high risk, the goal is less than 70 mg/dL (3.9 mmol/L). ? The goal for HDL is 40 mg/dL (2.2 mmol/L) for men and 50 mg/dL(2.8 mmol/L) for women. An HDL cholesterol of 60 mg/dL (3.3 mmol/L) or higher gives some protection against heart disease. ? The goal for triglycerides is less than 150 mg/dL (8.3 mmol/L). Immunizations  The yearly flu (influenza) vaccine is recommended for everyone 6 months or older who has diabetes.  The pneumonia (pneumococcal) vaccine is recommended for everyone 2 years or older who has diabetes. If you are 35 or older, you may get the pneumonia vaccine as a series of two separate shots.  The hepatitis B vaccine is recommended for adults shortly after they have been diagnosed with diabetes.  The Tdap (tetanus, diphtheria, and pertussis) vaccine should be given: ? According to normal childhood vaccination schedules, for children. ? Every 10 years, for adults who have diabetes.  The shingles vaccine is recommended for people who have had chicken pox and are 50 years or older. Mental and emotional health  Screening for symptoms of eating disorders, anxiety, and depression is recommended at the time of diagnosis and afterward as needed. If your screening shows that you have symptoms (you have a positive screening result), you may need further  evaluation and be referred to a mental health care provider. Diabetes self-management education  Education about how to manage your diabetes is recommended at diagnosis and ongoing as needed. Treatment plan  Your treatment plan will be reviewed at every medical visit. Summary  Managing diabetes (diabetes mellitus) can be complicated. Your diabetes treatment may be managed by a team of health care providers.  Your health care providers follow a schedule in order to help you get the best quality of care.  Standards of care including having regular physical exams, blood tests, blood pressure monitoring, immunizations, screening tests, and education about how to manage your diabetes.  Your health care providers may also give you more specific instructions based on your individual health. This information is not intended to replace advice given to you by your health care provider. Make sure you discuss any questions you have with your health care provider. Document Released: 06/05/2009 Document Revised: 05/06/2016 Document Reviewed:  05/06/2016 Elsevier Interactive Patient Education  [employer].

## 2017-05-19 NOTE — Progress Notes (Deleted)
Patient ID: Paul Lamb, male    DOB: 01-19-1986  MRN: [id number]  CC: re-establish and Hypertension   Subjective:                        Create Note1 [id number] DBJUC                My NoteAddendum  Yesterday               Expand All Collapse All            Hide copied text    Hover for attribution information                                                                                                                           untitled image        Patient ID: Paul Lamb, male    DOB: 09-11-1985  MRN: [id number]     CC: re-establish and Hypertension        Subjective:  Paul Lamb is a 31 y.o. male who presents for chronic disease management. Wife Victorino Dike and baby daughter are with him today  His concerns today include:   Patient with history of diastolic CHF with EF of 40-45%, HTN, OSA on CPAP, HL, morbid obesity, prediabetes.     Since his last visit here patient was hospitalized 11/2016 with acute CHF exacerbation due to hypertensive urgency. He followed up with cardiology a few weeks after that hospitalization.     1.  OSA on CPAP: For the most part he is using his CPAP but admits that sometimes he does not. 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 Diastolic CHF/HTN:  -Denies shortness of breath, chest pains, PND or orthopnea. Sleeps on one pillow.  -Endorses lower extremity and scrotal edema intermittently. He feels that the furosemide does not work consistently  -Not limiting salt as much as he should  - gained more than 20 pounds since April which he attributes to binge eating  -Reports compliance with medications which he has taken already for today     3. Depression:  Complains of feeling worthless, hopeless for  the past several months. It was worse several months ago to the point where he had SI. He was out in [place] and thought about driving his truck off a clift  -Attributes significant weight increase to binge eating over the past 1-2 months due to depression  -He talks with his wife about his feelings     4.  ED: Problems getting and maintaining erection for a few years but was too embarrassed to discuss it with prior PCP.     5. Prediabetes: Endorses polyuria. Drinks a lot of water during the day. No blurred vision. Family history of diabetes in mother and grandmother          Patient Active Problem List  Diagnosis   Date Noted    .   Prediabetes   11/19/2016    .   Hypertensive urgency   11/18/2016    .   CHF (congestive heart failure) (HCC)   11/18/2016    .   Acute on chronic diastolic CHF (congestive heart failure) (HCC)   11/17/2016    .   Acute diastolic (congestive) heart failure (HCC)   01/12/2016    .   Uncontrolled hypertension   01/12/2016    .   Sleep apnea   01/12/2016    .   AKI (acute kidney injury) (HCC)        .   Cardiomegaly - hypertensive        .   Elevated d-dimer        .   SOB (shortness of breath)        .   Abnormal EKG        .   DOE (dyspnea on exertion)        .   Malignant hypertension        .   Peripheral edema        .   Pulmonary edema cardiac cause (HCC)        .   Cough   11/01/2015    .   Hypertensive emergency   11/01/2015    .   Hand pain, left   11/01/2015    .   Morbid obesity (HCC)   11/01/2015    .   Bilateral lower extremity edema   11/01/2015    .   Elevated serum creatinine   11/01/2015    .   Chest tightness   11/01/2015                 Current Outpatient Prescriptions on File Prior to Visit    Medication   Sig   Dispense   Refill    .   aspirin EC 81 MG EC  tablet   Take 1 tablet (81 mg total) by mouth daily.                No current facility-administered medications on file prior to visit.                Allergies    Allergen   Reactions    .   Penicillins   Other (See Comments)           Social History             Social History    .   Marital status:   Married            Spouse name:   N/A    .   Number of children:   N/A    .   Years of education:   N/A           Occupational History    .   Not on file.            Social History Main Topics    .   Smoking status:   Never Smoker    .   Smokeless tobacco:   Never Used    .   Alcohol use   No    .   Drug use:   No    .   Sexual activity:   Not on file            Other Topics  Concern    .   Not on file           Social History Narrative    .   No narrative on file                Family History    Problem   Relation   Age of Onset    .   Hypertension   Unknown        .   Diabetes Mellitus II   Unknown                    Past Surgical History:    Procedure   Laterality   Date    .   NO PAST SURGERIES                  ROS:  Review of Systems  Negative except as stated above  PHYSICAL EXAM:  BP (!) 190/144   Pulse 97   Temp 98.9 F (37.2 C) (Oral)   Resp 16   Wt (!) 443 lb 6.4 oz (201.1 kg)   SpO2 96%   BMI 56.93 kg/m    Repeat BP160/110      Wt Readings from Last 3 Encounters:    05/19/17   (!) 443 lb 6.4 oz (201.1 kg)    12/06/16   (!) 398 lb (180.5 kg)    11/21/16   (!) 396 lb 14.4 oz (180 kg)          Physical Exam  General appearance - alert, well appearing, morbidly obese young African-American male and in no distress  Mental status -flat affect and poor eye contact  Eyes - pink conjunctiva   Mouth - mucous membranes moist, pharynx normal  without lesions  Neck - supple, no significant adenopathy  Chest - clear to auscultation, no wheezes, rales or rhonchi, symmetric air entry  Heart - normal rate, regular rhythm, normal S1, S2, no murmurs, rubs, clicks or gallops  Extremities - large lower extremities. Trace lower extremity edema.  GU: No scrotal edema noted at this time. Patient wife in the room and in view of exam        Chemistry        Labs (Brief)                                                                                                                         Labs (Brief)  A1C today 7     ASSESSMENT AND PLAN:  1. Essential hypertension  -Blood pressure not at goal. Amlodipine increased to 10 mg. Refills given on all other medications. Strongly encouraged salt restriction.  - labetalol (NORMODYNE) 200 MG tablet; Take 1 tablet (200 mg total) by mouth 2 (two) times daily.  Dispense: 60 tablet; Refill: 6  - atorvastatin (LIPITOR) 20 MG tablet; Take 1 tablet (20 mg total) by mouth daily at 6 PM.  Dispense: 30 tablet; Refill: 6  - lisinopril (PRINIVIL,ZESTRIL) 20 MG tablet; Take 1 tablet (20 mg total) by mouth daily.  Dispense: 30 tablet; Refill: 6  - furosemide (LASIX) 40 MG tablet; Take 2 tablets (80 mg total) by mouth daily. Must have office visit for refills  Dispense: 60 tablet; Refill: 6  - amLODipine (NORVASC) 10 MG tablet; Take 1 tablet (10 mg total) by mouth daily.  Dispense: 30 tablet; Refill: 6  - hydrALAZINE (APRESOLINE) 25 MG tablet; Take 2 tablets (50 mg total) by mouth 3 (three) times daily.  Dispense: 180 tablet; Refill: 6  - potassium chloride (K-DUR) 10 MEQ tablet; Take 1 tablet (10 mEq total) by mouth daily.  Dispense: 30 tablet; Refill: 6  -  CBC  - Comprehensive metabolic panel     2. Chronic diastolic congestive heart failure (HCC)  -Continue compliance with medications.  Amlodipine increased for better BP control. Furosemide increased to 80 mg daily. Salt and fluid restriction and encouraged  - Ambulatory referral to Cardiology     3. OSA on CPAP  -Encourage consistent use of CPAP especially given the type of job he does     4. Erectile dysfunction, unspecified erectile dysfunction type  -Patient willing to give a trial of Viagra  Pt advised of possible side effects of this medication including prolong erection, flushing, headaches, stuff nose and sudden vision and hearing changes.  Pt told to be seen in ER if he has erection lasting longer than 3-4 hrs, or if he has sudden vision changes or hearing loss.  - sildenafil (VIAGRA) 100 MG tablet; Take 1/2-1 tab PO 30 mins before intercourse PRN. Limit to 100 mg/24 hrs  Dispense: 10 tablet; Refill: 6     5. Morbid obesity, unspecified obesity type (HCC)  See #7 below     6. Depression, unspecified depression type  -Patient with significant depression. He denies SI at this time but admits that he was several weeks ago. LCSW currently not available but he is amendable to seeing her. We will have this scheduled  -Patient agreeable to starting an antidepressant. Will start him on low dose of Zoloft and have him follow-up in 6 weeks  - sertraline (ZOLOFT) 50 MG tablet; Take 1/2 tab PO daily x 2 wks then 1 tab PO daily  Dispense: 30 tablet; Refill: 3     7. Diabetes mellitus, new onset (HCC)  Discussed the importance of healthy eating habits, regular aerobic exercise (at least 150 minutes a week as tolerated) and medication compliance to achieve or maintain control of diabetes.  -Start metformin. Went over symptoms of hypoglycemia and how to treat should it occur. Encouraged him to purchase Glucotrol tabs from [facility] and Keep some on him at all times  -  metFORMIN (GLUCOPHAGE) 500 MG tablet; Take 1 tablet (500 mg total) by mouth 2 (two) times daily with a meal.  Dispense: 180 tablet; Refill: 3     Patient was given the opportunity to ask questions.  Patient verbalized understanding of the plan and  was able to repeat key elements of the plan.          Orders Placed This Encounter    Procedures    .   CBC    .   Comprehensive metabolic panel    .   Ambulatory referral to Cardiology              Requested Prescriptions              Signed Prescriptions   Disp   Refills    .   labetalol (NORMODYNE) 200 MG tablet   60 tablet   6            Sig: Take 1 tablet (200 mg total) by mouth 2 (two) times daily.    Marland Kitchen   atorvastatin (LIPITOR) 20 MG tablet   30 tablet   6            Sig: Take 1 tablet (20 mg total) by mouth daily at 6 PM.    .   lisinopril (PRINIVIL,ZESTRIL) 20 MG tablet   30 tablet   6            Sig: Take 1 tablet (20 mg total) by mouth daily.    .   furosemide (LASIX) 40 MG tablet   60 tablet   6            Sig: Take 2 tablets (80 mg total) by mouth daily. Must have office visit for refills    .   amLODipine (NORVASC) 10 MG tablet   30 tablet   6            Sig: Take 1 tablet (10 mg total) by mouth daily.    .   hydrALAZINE (APRESOLINE) 25 MG tablet   180 tablet   6            Sig: Take 2 tablets (50 mg total) by mouth 3 (three) times daily.    .   potassium chloride (K-DUR) 10 MEQ tablet   30 tablet   6            Sig: Take 1 tablet (10 mEq total) by mouth daily.    .   sildenafil (VIAGRA) 100 MG tablet   10 tablet   6            Sig: Take 1/2-1 tab PO 30 mins before intercourse PRN. Limit to 100 mg/24 hrs    .   sertraline (ZOLOFT) 50 MG tablet   30 tablet   3            Sig: Take 1/2 tab PO daily x 2 wks then 1 tab PO daily    .   metFORMIN (GLUCOPHAGE) 500 MG  tablet   180 tablet   3            Sig: Take 1 tablet (500 mg total) by mouth 2 (two) times daily with a meal.          Return in about 6 weeks (around 06/30/2017).     [name], MD, FACP                           My NoteIncomplete  Yesterday       ROSPhysical Exam  SensitiveBookmarkShare w/ PatientDetails   Pend on saving noteSign at Close EncounterSign on saving note  AcceptCancel  Review of Systems  PHYSICAL EXAM: BP (!) 190/144   Pulse 97   Temp 98.9 F (37.2 C) (Oral)   Resp 16   Wt (!) 443 lb 6.4 oz (201.1 kg)   SpO2 96%   BMI 56.93 kg/m   Physical Exam  {male adult master:310786} {male adult master:[id number]}  BS 124  ASSESSMENT AND PLAN: There are no diagnoses linked to this encounter.  Patient was given the opportunity to ask questions.  Patient verbalized understanding of the plan and was able to repeat key elements of the plan.   No orders of the defined types were placed in this encounter.    Requested Prescriptions    No prescriptions requested or ordered in this encounter    No Follow-up on file.  [name], MD, FACP

## 2017-05-19 NOTE — Progress Notes (Addendum)
Patient ID: Paul Lamb, male    DOB: Dec 23, 1985  MRN: [id number]  CC: re-establish and Hypertension   Subjective: Paul Lamb is a 31 y.o. male who presents for chronic disease management. Wife Victorino Dike and baby daughter are with him today His concerns today include:  Patient with history of diastolic CHF with EF of 40-45%, HTN, OSA on CPAP, HL, morbid obesity, prediabetes.  Since his last visit here patient was hospitalized 11/2016 with acute CHF exacerbation due to hypertensive urgency. He followed up with cardiology a few weeks after that hospitalization.  1.  OSA on CPAP: For the most part he is using his CPAP but admits that sometimes he does not. 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 Diastolic CHF/HTN: -Denies shortness of breath, chest pains, PND or orthopnea. Sleeps on one pillow. -Endorses lower extremity and scrotal edema intermittently. He feels that the furosemide does not work consistently -Not limiting salt as much as he should - gained more than 20 pounds since April which he attributes to binge eating -Reports compliance with medications which he has taken already for today  3. Depression: Complains of feeling worthless, hopeless for the past several months. It was worse several months ago to the point where he had SI. He was out in [place] and thought about driving his truck off a [place] -Attributes significant weight increase to binge eating over the past 1-2 months due to depression -He talks with his wife about his feelings  4.  ED: Problems getting and maintaining erection for a few years but was too embarrassed to discuss it with prior PCP.  5. Prediabetes: Endorses polyuria. Drinks a lot of water during the day. No blurred vision. Family history of diabetes in mother and grandmother  Patient Active Problem List   Diagnosis Date Noted  . Prediabetes 11/19/2016  . Hypertensive urgency 11/18/2016  . CHF (congestive heart  failure) (HCC) 11/18/2016  . Acute on chronic diastolic CHF (congestive heart failure) (HCC) 11/17/2016  . Acute diastolic (congestive) heart failure (HCC) 01/12/2016  . Uncontrolled hypertension 01/12/2016  . Sleep apnea 01/12/2016  . AKI (acute kidney injury) (HCC)   . Cardiomegaly - hypertensive   . Elevated d-dimer   . SOB (shortness of breath)   . Abnormal EKG   . DOE (dyspnea on exertion)   . Malignant hypertension   . Peripheral edema   . Pulmonary edema cardiac cause (HCC)   . Cough 11/01/2015  . Hypertensive emergency 11/01/2015  . Hand pain, left 11/01/2015  . Morbid obesity (HCC) 11/01/2015  . Bilateral lower extremity edema 11/01/2015  . Elevated serum creatinine 11/01/2015  . Chest tightness 11/01/2015     Current Outpatient Prescriptions on File Prior to Visit  Medication Sig Dispense Refill  . aspirin EC 81 MG EC tablet Take 1 tablet (81 mg total) by mouth daily.     No current facility-administered medications on file prior to visit.     Allergies  Allergen Reactions  . Penicillins Other (See Comments)    Social History   Social History  . Marital status: Married    Spouse name: N/A  . Number of children: N/A  . Years of education: N/A   Occupational History  . Not on file.   Social History Main Topics  . Smoking status: Never Smoker  . Smokeless tobacco: Never Used  . Alcohol use No  . Drug use: No  . Sexual activity: Not on file  Other Topics Concern  . Not on file   Social History Narrative  . No narrative on file    Family History  Problem Relation Age of Onset  . Hypertension Unknown   . Diabetes Mellitus II Unknown     Past Surgical History:  Procedure Laterality Date  . NO PAST SURGERIES      ROS: Review of Systems Negative except as stated above PHYSICAL EXAM: BP (!) 190/144   Pulse 97   Temp 98.9 F (37.2 C) (Oral)   Resp 16   Wt (!) 443 lb 6.4 oz (201.1 kg)   SpO2 96%   BMI 56.93 kg/m   Repeat BP160/110 Wt  Readings from Last 3 Encounters:  05/19/17 (!) 443 lb 6.4 oz (201.1 kg)  12/06/16 (!) 398 lb (180.5 kg)  11/21/16 (!) 396 lb 14.4 oz (180 kg)    Physical Exam General appearance - alert, well appearing, morbidly obese young African-American male and in no distress Mental status -flat affect and poor eye contact Eyes - pink conjunctiva  Mouth - mucous membranes moist, pharynx normal without lesions Neck - supple, no significant adenopathy Chest - clear to auscultation, no wheezes, rales or rhonchi, symmetric air entry Heart - normal rate, regular rhythm, normal S1, S2, no murmurs, rubs, clicks or gallops Extremities - large lower extremities. Trace lower extremity edema. GU: No scrotal edema noted at this time. Patient wife in the room and in view of exam    Chemistry      Component Value Date/Time   NA 137 11/21/2016 0451   K 4.5 11/21/2016 0451   CL 101 11/21/2016 0451   CO2 27 11/21/2016 0451   BUN 15 11/21/2016 0451   CREATININE 1.21 11/21/2016 0451   CREATININE 1.05 11/25/2015 1324      Component Value Date/Time   CALCIUM 8.9 11/21/2016 0451   ALKPHOS 62 11/06/2015 0530   AST 22 11/06/2015 0530   ALT 18 11/06/2015 0530   BILITOT 1.1 11/06/2015 0530     A1C today 7  ASSESSMENT AND PLAN: 1. Essential hypertension -Blood pressure not at goal. Amlodipine increased to 10 mg. Refills given on all other medications. Strongly encouraged salt restriction. - labetalol (NORMODYNE) 200 MG tablet; Take 1 tablet (200 mg total) by mouth 2 (two) times daily.  Dispense: 60 tablet; Refill: 6 - atorvastatin (LIPITOR) 20 MG tablet; Take 1 tablet (20 mg total) by mouth daily at 6 PM.  Dispense: 30 tablet; Refill: 6 - lisinopril (PRINIVIL,ZESTRIL) 20 MG tablet; Take 1 tablet (20 mg total) by mouth daily.  Dispense: 30 tablet; Refill: 6 - furosemide (LASIX) 40 MG tablet; Take 2 tablets (80 mg total) by mouth daily. Must have office visit for refills  Dispense: 60 tablet; Refill: 6 -  amLODipine (NORVASC) 10 MG tablet; Take 1 tablet (10 mg total) by mouth daily.  Dispense: 30 tablet; Refill: 6 - hydrALAZINE (APRESOLINE) 25 MG tablet; Take 2 tablets (50 mg total) by mouth 3 (three) times daily.  Dispense: 180 tablet; Refill: 6 - potassium chloride (K-DUR) 10 MEQ tablet; Take 1 tablet (10 mEq total) by mouth daily.  Dispense: 30 tablet; Refill: 6 - CBC - Comprehensive metabolic panel  2. Chronic diastolic congestive heart failure (HCC) -Continue compliance with medications. Amlodipine increased for better BP control. Furosemide increased to 80 mg daily. Salt and fluid restriction and encouraged - Ambulatory referral to Cardiology  3. OSA on CPAP -Encourage consistent use of CPAP especially given the type of job he does  4. Erectile dysfunction, unspecified erectile dysfunction type -Patient willing to give a trial of Viagra Pt advised of possible side effects of this medication including prolong erection, flushing, headaches, stuff nose and sudden vision and hearing changes.  Pt told to be seen in ER if he has erection lasting longer than 3-4 hrs, or if he has sudden vision changes or hearing loss. - sildenafil (VIAGRA) 100 MG tablet; Take 1/2-1 tab PO 30 mins before intercourse PRN. Limit to 100 mg/24 hrs  Dispense: 10 tablet; Refill: 6  5. Morbid obesity, unspecified obesity type (HCC) See #7 below  6. Depression, unspecified depression type -Patient with significant depression. He denies SI at this time but admits that he was several weeks ago. LCSW currently not available but he is amendable to seeing her. We will have this scheduled -Patient agreeable to starting an antidepressant. Will start him on low dose of Zoloft and have him follow-up in 6 weeks - sertraline (ZOLOFT) 50 MG tablet; Take 1/2 tab PO daily x 2 wks then 1 tab PO daily  Dispense: 30 tablet; Refill: 3  7. Diabetes mellitus, new onset (HCC) Discussed the importance of healthy eating habits, regular  aerobic exercise (at least 150 minutes a week as tolerated) and medication compliance to achieve or maintain control of diabetes. -Start metformin. Went over symptoms of hypoglycemia and how to treat should it occur. Encouraged him to purchase Glucotrol tabs from [facility] and Keep some on him at all times - metFORMIN (GLUCOPHAGE) 500 MG tablet; Take 1 tablet (500 mg total) by mouth 2 (two) times daily with a meal.  Dispense: 180 tablet; Refill: 3  Patient was given the opportunity to ask questions.  Patient verbalized understanding of the plan and was able to repeat key elements of the plan.   Orders Placed This Encounter  Procedures  . CBC  . Comprehensive metabolic panel  . Ambulatory referral to Cardiology     Requested Prescriptions   Signed Prescriptions Disp Refills  . labetalol (NORMODYNE) 200 MG tablet 60 tablet 6    Sig: Take 1 tablet (200 mg total) by mouth 2 (two) times daily.  Marland Kitchen atorvastatin (LIPITOR) 20 MG tablet 30 tablet 6    Sig: Take 1 tablet (20 mg total) by mouth daily at 6 PM.  . lisinopril (PRINIVIL,ZESTRIL) 20 MG tablet 30 tablet 6    Sig: Take 1 tablet (20 mg total) by mouth daily.  . furosemide (LASIX) 40 MG tablet 60 tablet 6    Sig: Take 2 tablets (80 mg total) by mouth daily. Must have office visit for refills  . amLODipine (NORVASC) 10 MG tablet 30 tablet 6    Sig: Take 1 tablet (10 mg total) by mouth daily.  . hydrALAZINE (APRESOLINE) 25 MG tablet 180 tablet 6    Sig: Take 2 tablets (50 mg total) by mouth 3 (three) times daily.  . potassium chloride (K-DUR) 10 MEQ tablet 30 tablet 6    Sig: Take 1 tablet (10 mEq total) by mouth daily.  . sildenafil (VIAGRA) 100 MG tablet 10 tablet 6    Sig: Take 1/2-1 tab PO 30 mins before intercourse PRN. Limit to 100 mg/24 hrs  . sertraline (ZOLOFT) 50 MG tablet 30 tablet 3    Sig: Take 1/2 tab PO daily x 2 wks then 1 tab PO daily  . metFORMIN (GLUCOPHAGE) 500 MG tablet 180 tablet 3    Sig: Take 1 tablet (500 mg  total) by mouth 2 (two) times daily with  a meal.    Return in about 6 weeks (around 06/30/2017).  [name], MD, FACP

## 2017-05-20 LAB — CBC
Hematocrit: 43.8 % (ref 37.5–51.0)
Hemoglobin: 14.8 g/dL (ref 13.0–17.7)
MCH: 24.1 pg — AB (ref 26.6–33.0)
MCHC: 33.8 g/dL (ref 31.5–35.7)
MCV: 72 fL — ABNORMAL LOW (ref 79–97)
Platelets: 233 10*3/uL (ref 150–379)
RBC: 6.13 x10E6/uL — AB (ref 4.14–5.80)
RDW: 16.3 % — AB (ref 12.3–15.4)
WBC: 7.9 10*3/uL (ref 3.4–10.8)

## 2017-05-20 LAB — COMPREHENSIVE METABOLIC PANEL
A/G RATIO: 1.5 (ref 1.2–2.2)
ALT: 19 IU/L (ref 0–44)
AST: 24 IU/L (ref 0–40)
Albumin: 4.1 g/dL (ref 3.5–5.5)
Alkaline Phosphatase: 83 IU/L (ref 39–117)
BILIRUBIN TOTAL: 1 mg/dL (ref 0.0–1.2)
BUN/Creatinine Ratio: 10 (ref 9–20)
BUN: 12 mg/dL (ref 6–20)
CALCIUM: 9.3 mg/dL (ref 8.7–10.2)
CHLORIDE: 99 mmol/L (ref 96–106)
CO2: 23 mmol/L (ref 20–29)
Creatinine, Ser: 1.16 mg/dL (ref 0.76–1.27)
GFR calc non Af Amer: 85 mL/min/{1.73_m2} (ref >59)
GFR, EST AFRICAN AMERICAN: 98 mL/min/{1.73_m2} (ref >59)
GLUCOSE: 91 mg/dL (ref 65–99)
Globulin, Total: 2.8 g/dL (ref 1.5–4.5)
POTASSIUM: 4.4 mmol/L (ref 3.5–5.2)
Sodium: 142 mmol/L (ref 134–144)
TOTAL PROTEIN: 6.9 g/dL (ref 6.0–8.5)

## 2017-05-25 ENCOUNTER — Encounter: Payer: Self-pay | Admitting: Internal Medicine

## 2017-06-05 ENCOUNTER — Ambulatory Visit: Payer: Self-pay | Attending: Internal Medicine

## 2017-06-05 MED FILL — !VIAGRA 100MG TABLET: 100 | 30 days supply | Qty: 10 | Fill #1

## 2017-06-07 ENCOUNTER — Other Ambulatory Visit: Payer: Self-pay

## 2017-06-07 DIAGNOSIS — N529 Male erectile dysfunction, unspecified: Secondary | ICD-10-CM

## 2017-06-07 MED ORDER — SILDENAFIL CITRATE 100 MG PO TABS: ORAL_TABLET | ORAL | 3 refills | Status: DC

## 2017-06-30 ENCOUNTER — Ambulatory Visit: Payer: Self-pay | Admitting: Internal Medicine

## 2017-08-22 ENCOUNTER — Encounter: Payer: Self-pay | Admitting: Internal Medicine

## 2017-08-22 ENCOUNTER — Other Ambulatory Visit: Payer: Self-pay | Admitting: Internal Medicine

## 2017-08-22 DIAGNOSIS — I10 Essential (primary) hypertension: Secondary | ICD-10-CM

## 2017-09-09 ENCOUNTER — Inpatient Hospital Stay (HOSPITAL_COMMUNITY)
Admission: EM | Admit: 2017-09-09 | Discharge: 2017-09-14 | DRG: 304 | Disposition: A | Discharge status: Home / Self Care | Payer: Medicaid Other | Attending: Nephrology | Admitting: Nephrology

## 2017-09-09 ENCOUNTER — Other Ambulatory Visit: Payer: Self-pay

## 2017-09-09 ENCOUNTER — Emergency Department (HOSPITAL_COMMUNITY): Payer: Medicaid Other

## 2017-09-09 ENCOUNTER — Encounter (HOSPITAL_COMMUNITY): Payer: Self-pay | Admitting: *Deleted

## 2017-09-09 DIAGNOSIS — Z880 Allergy status to penicillin: Secondary | ICD-10-CM

## 2017-09-09 DIAGNOSIS — Z7984 Long term (current) use of oral hypoglycemic drugs: Secondary | ICD-10-CM

## 2017-09-09 DIAGNOSIS — R809 Proteinuria, unspecified: Secondary | ICD-10-CM | POA: Diagnosis present

## 2017-09-09 DIAGNOSIS — I429 Cardiomyopathy, unspecified: Secondary | ICD-10-CM | POA: Diagnosis present

## 2017-09-09 DIAGNOSIS — I161 Hypertensive emergency: Secondary | ICD-10-CM | POA: Diagnosis not present

## 2017-09-09 DIAGNOSIS — R51 Headache: Secondary | ICD-10-CM | POA: Diagnosis not present

## 2017-09-09 DIAGNOSIS — E876 Hypokalemia: Secondary | ICD-10-CM | POA: Diagnosis present

## 2017-09-09 DIAGNOSIS — R778 Other specified abnormalities of plasma proteins: Secondary | ICD-10-CM

## 2017-09-09 DIAGNOSIS — E119 Type 2 diabetes mellitus without complications: Secondary | ICD-10-CM | POA: Diagnosis present

## 2017-09-09 DIAGNOSIS — T463X5A Adverse effect of coronary vasodilators, initial encounter: Secondary | ICD-10-CM | POA: Diagnosis not present

## 2017-09-09 DIAGNOSIS — R748 Abnormal levels of other serum enzymes: Secondary | ICD-10-CM | POA: Diagnosis present

## 2017-09-09 DIAGNOSIS — I5023 Acute on chronic systolic (congestive) heart failure: Secondary | ICD-10-CM

## 2017-09-09 DIAGNOSIS — Z7982 Long term (current) use of aspirin: Secondary | ICD-10-CM

## 2017-09-09 DIAGNOSIS — Z79899 Other long term (current) drug therapy: Secondary | ICD-10-CM

## 2017-09-09 DIAGNOSIS — R0789 Other chest pain: Secondary | ICD-10-CM

## 2017-09-09 DIAGNOSIS — Z6841 Body Mass Index (BMI) 40.0 and over, adult: Secondary | ICD-10-CM | POA: Diagnosis not present

## 2017-09-09 DIAGNOSIS — I110 Hypertensive heart disease with heart failure: Secondary | ICD-10-CM | POA: Diagnosis present

## 2017-09-09 DIAGNOSIS — Z9119 Patient's noncompliance with other medical treatment and regimen: Secondary | ICD-10-CM

## 2017-09-09 DIAGNOSIS — I5043 Acute on chronic combined systolic (congestive) and diastolic (congestive) heart failure: Secondary | ICD-10-CM | POA: Diagnosis present

## 2017-09-09 DIAGNOSIS — I509 Heart failure, unspecified: Secondary | ICD-10-CM

## 2017-09-09 DIAGNOSIS — I10 Essential (primary) hypertension: Secondary | ICD-10-CM

## 2017-09-09 DIAGNOSIS — R7989 Other specified abnormal findings of blood chemistry: Secondary | ICD-10-CM

## 2017-09-09 DIAGNOSIS — F329 Major depressive disorder, single episode, unspecified: Secondary | ICD-10-CM | POA: Diagnosis present

## 2017-09-09 DIAGNOSIS — G4733 Obstructive sleep apnea (adult) (pediatric): Secondary | ICD-10-CM | POA: Diagnosis present

## 2017-09-09 DIAGNOSIS — E785 Hyperlipidemia, unspecified: Secondary | ICD-10-CM | POA: Diagnosis present

## 2017-09-09 HISTORY — DX: Type 2 diabetes mellitus without complications: E11.9

## 2017-09-09 LAB — URINALYSIS, ROUTINE W REFLEX MICROSCOPIC
BILIRUBIN URINE: NEGATIVE
Glucose, UA: NEGATIVE mg/dL
HGB URINE DIPSTICK: NEGATIVE
Ketones, ur: NEGATIVE mg/dL
Leukocytes, UA: NEGATIVE
NITRITE: NEGATIVE
PH: 7 (ref 5.0–8.0)
Protein, ur: >=300 mg/dL — AB
SPECIFIC GRAVITY, URINE: 1.012 (ref 1.005–1.030)
Squamous Epithelial / LPF: NONE SEEN

## 2017-09-09 LAB — I-STAT TROPONIN, ED: TROPONIN I, POC: 0.1 ng/mL — AB (ref 0.00–0.08)

## 2017-09-09 LAB — BASIC METABOLIC PANEL
Anion gap: 11 (ref 5–15)
BUN: 8 mg/dL (ref 6–20)
CALCIUM: 9.2 mg/dL (ref 8.9–10.3)
CO2: 26 mmol/L (ref 22–32)
CREATININE: 1.24 mg/dL (ref 0.61–1.24)
Chloride: 101 mmol/L (ref 101–111)
GFR calc Af Amer: >60 mL/min (ref >60)
GFR calc non Af Amer: >60 mL/min (ref >60)
Glucose, Bld: 104 mg/dL — ABNORMAL HIGH (ref 65–99)
Potassium: 3.9 mmol/L (ref 3.5–5.1)
SODIUM: 138 mmol/L (ref 135–145)

## 2017-09-09 LAB — CBC
HCT: 41 % (ref 39.0–52.0)
Hemoglobin: 14.4 g/dL (ref 13.0–17.0)
MCH: 24.5 pg — ABNORMAL LOW (ref 26.0–34.0)
MCHC: 35.1 g/dL (ref 30.0–36.0)
MCV: 69.8 fL — ABNORMAL LOW (ref 78.0–100.0)
PLATELETS: 228 10*3/uL (ref 150–400)
RBC: 5.87 MIL/uL — AB (ref 4.22–5.81)
RDW: 14.6 % (ref 11.5–15.5)
WBC: 9.6 10*3/uL (ref 4.0–10.5)

## 2017-09-09 LAB — BRAIN NATRIURETIC PEPTIDE: B Natriuretic Peptide: 137.2 pg/mL — ABNORMAL HIGH (ref 0.0–100.0)

## 2017-09-09 LAB — TROPONIN I: Troponin I: 0.08 ng/mL (ref <0.03)

## 2017-09-09 LAB — TSH: TSH: 1.643 u[IU]/mL (ref 0.350–4.500)

## 2017-09-09 MED ORDER — NITROGLYCERIN 2 % TD OINT
1.0000 [in_us] | TOPICAL_OINTMENT | Freq: Once | TRANSDERMAL | Status: AC
  Administered 2017-09-09: 1 [in_us] via TOPICAL
  Filled 2017-09-09: qty 1

## 2017-09-09 MED ORDER — LIDOCAINE-EPINEPHRINE (PF) 2 %-1:200000 IJ SOLN
20.0000 mL | Freq: Once | INTRAMUSCULAR | Status: AC
  Administered 2017-09-09: 20 mL
  Filled 2017-09-09: qty 20

## 2017-09-09 MED ORDER — LIDOCAINE-EPINEPHRINE 2 %-1:100000 IJ SOLN: 20.0000 mL | Freq: Once | INTRAMUSCULAR | Status: DC

## 2017-09-09 MED ORDER — FENTANYL CITRATE (PF) 100 MCG/2ML IJ SOLN: 50.0000 ug | INTRAMUSCULAR | Status: DC | PRN

## 2017-09-09 MED ORDER — SODIUM CHLORIDE 0.9 % IV SOLN: 250.0000 mL | INTRAVENOUS | Status: DC | PRN

## 2017-09-09 MED ORDER — ONDANSETRON HCL 4 MG/2ML IJ SOLN: 4.0000 mg | Freq: Four times a day (QID) | INTRAMUSCULAR | Status: DC | PRN

## 2017-09-09 MED ORDER — ACETAMINOPHEN 325 MG PO TABS: 650.0000 mg | ORAL_TABLET | Freq: Four times a day (QID) | ORAL | Status: DC | PRN

## 2017-09-09 MED ORDER — ASPIRIN 81 MG PO CHEW
324.0000 mg | CHEWABLE_TABLET | Freq: Once | ORAL | Status: AC
  Administered 2017-09-09: 324 mg via ORAL
  Filled 2017-09-09: qty 4

## 2017-09-09 MED ORDER — LABETALOL HCL 5 MG/ML IV SOLN
20.0000 mg | Freq: Once | INTRAVENOUS | Status: AC
  Administered 2017-09-09: 20 mg via INTRAVENOUS
  Filled 2017-09-09: qty 4

## 2017-09-09 MED ORDER — FUROSEMIDE 10 MG/ML IJ SOLN
40.0000 mg | Freq: Once | INTRAMUSCULAR | Status: AC
  Administered 2017-09-09: 40 mg via INTRAVENOUS
  Filled 2017-09-09: qty 4

## 2017-09-09 MED ORDER — FUROSEMIDE 10 MG/ML IJ SOLN
60.0000 mg | Freq: Two times a day (BID) | INTRAMUSCULAR | Status: DC
  Administered 2017-09-10: 60 mg via INTRAVENOUS
  Filled 2017-09-09: qty 8

## 2017-09-09 MED ORDER — NITROGLYCERIN 0.4 MG SL SUBL: 0.4000 mg | SUBLINGUAL_TABLET | SUBLINGUAL | Status: DC | PRN

## 2017-09-09 MED ORDER — SODIUM CHLORIDE 0.9% FLUSH
3.0000 mL | INTRAVENOUS | Status: DC | PRN
  Administered 2017-09-10: 3 mL via INTRAVENOUS

## 2017-09-09 MED ORDER — NITROGLYCERIN IN D5W 200-5 MCG/ML-% IV SOLN
50.0000 ug/min | Freq: Once | INTRAVENOUS | Status: AC
  Administered 2017-09-09: 50 ug/min via INTRAVENOUS
  Filled 2017-09-09: qty 250

## 2017-09-09 MED ORDER — ACETAMINOPHEN 325 MG PO TABS
650.0000 mg | ORAL_TABLET | ORAL | Status: DC | PRN
  Administered 2017-09-10 – 2017-09-12 (×3): 650 mg via ORAL
  Filled 2017-09-09 (×3): qty 2

## 2017-09-09 MED ORDER — ATORVASTATIN CALCIUM 10 MG PO TABS
20.0000 mg | ORAL_TABLET | Freq: Every day | ORAL | Status: DC
  Administered 2017-09-09: 20 mg via ORAL
  Filled 2017-09-09 (×2): qty 1

## 2017-09-09 MED ORDER — NITROGLYCERIN IN D5W 200-5 MCG/ML-% IV SOLN
0.0000 ug/min | INTRAVENOUS | Status: DC
  Administered 2017-09-09: 60 ug/min via INTRAVENOUS
  Administered 2017-09-10: 110 ug/min via INTRAVENOUS
  Administered 2017-09-12: 12 ug/min via INTRAVENOUS
  Filled 2017-09-09 (×3): qty 250

## 2017-09-09 MED ORDER — SODIUM CHLORIDE 0.9% FLUSH
3.0000 mL | Freq: Two times a day (BID) | INTRAVENOUS | Status: DC
  Administered 2017-09-11 – 2017-09-13 (×4): 3 mL via INTRAVENOUS

## 2017-09-09 NOTE — ED Provider Notes (Signed)
[name] EMERGENCY DEPARTMENT Provider Note   CSN: [id number] Arrival date & time: 09/09/17  1442     History   Chief Complaint Chief Complaint  Patient presents with  . Hypertension  . Headache    HPI Paul Lamb is a 32 y.o. male.  Patient with history of congestive heart failure, high blood pressure, obesity, sleep apnea presents with worsening elevated blood pressure and noncompliance with blood pressure meds past 3 days. Patient did not take his medicines up. Patient has had minimal shortness of breath, nonspecific chest pressure not currently an bilateral leg edema. No history of blood clots, no recent surgeries. No fevers or chills. No active chest pain.      Past Medical History:  Diagnosis Date  . Acute combined systolic and diastolic CHF, NYHA class 3 (HCC) 11/18/2016   EF now 40-45% by echo  . Acute diastolic (congestive) heart failure (HCC) 01/12/2016  . Hypertension   . Morbid obesity (HCC)   . OSA (obstructive sleep apnea)     Patient Active Problem List   Diagnosis Date Noted  . Chest pressure   . Erectile dysfunction 05/19/2017  . Depression 05/19/2017  . CHF (congestive heart failure) (HCC) 11/18/2016  . Sleep apnea 01/12/2016  . Cardiomegaly - hypertensive   . Abnormal EKG   . Hypertensive emergency 11/01/2015  . Hand pain, left 11/01/2015  . Morbid obesity, unspecified obesity type (HCC) 11/01/2015    Past Surgical History:  Procedure Laterality Date  . NO PAST SURGERIES         Home Medications    Prior to Admission medications   Medication Sig Start Date End Date Taking? Authorizing Provider  amLODipine (NORVASC) 10 MG tablet Take 1 tablet (10 mg total) by mouth daily. 05/19/17 09/09/17 Yes Marcine Matar, MD  aspirin EC 81 MG EC tablet Take 1 tablet (81 mg total) by mouth daily. 11/22/16  Yes Johnson, Clanford L, MD  atorvastatin (LIPITOR) 20 MG tablet Take 1 tablet (20 mg total) by mouth daily at 6 PM.  05/19/17  Yes Marcine Matar, MD  furosemide (LASIX) 40 MG tablet Take 2 tablets (80 mg total) by mouth daily. Must have office visit for refills 05/19/17 09/09/17 Yes Marcine Matar, MD  hydrALAZINE (APRESOLINE) 25 MG tablet Take 2 tablets (50 mg total) by mouth 3 (three) times daily. 05/19/17 09/09/17 Yes Marcine Matar, MD  labetalol (NORMODYNE) 200 MG tablet Take 1 tablet (200 mg total) by mouth 2 (two) times daily. 05/19/17 09/09/17 Yes Marcine Matar, MD  lisinopril (PRINIVIL,ZESTRIL) 20 MG tablet Take 1 tablet (20 mg total) by mouth daily. 05/19/17 09/09/17 Yes Marcine Matar, MD  metFORMIN (GLUCOPHAGE) 500 MG tablet Take 1 tablet (500 mg total) by mouth 2 (two) times daily with a meal. 05/19/17  Yes Marcine Matar, MD  potassium chloride (K-DUR) 10 MEQ tablet Take 1 tablet (10 mEq total) by mouth daily. 05/19/17 09/09/17 Yes Marcine Matar, MD  sertraline (ZOLOFT) 50 MG tablet Take 1/2 tab PO daily x 2 wks then 1 tab PO daily Patient taking differently: Take 50 mg by mouth daily.  05/19/17  Yes Marcine Matar, MD  sildenafil (VIAGRA) 100 MG tablet Take 1/2-1 tab PO 30 mins before intercourse PRN. Limit to 100 mg/24 hrs 06/07/17  Yes Marcine Matar, MD    Family History Family History  Problem Relation Age of Onset  . Hypertension Unknown   . Diabetes Mellitus II Unknown  Social History Social History   Tobacco Use  . Smoking status: Never Smoker  . Smokeless tobacco: Never Used  Substance Use Topics  . Alcohol use: No    Alcohol/week: 0.0 oz  . Drug use: No     Allergies   Penicillins   Review of Systems Review of Systems  Constitutional: Negative for chills and fever.  HENT: Negative for congestion.   Eyes: Negative for visual disturbance.  Respiratory: Negative for shortness of breath.   Cardiovascular: Negative for chest pain.  Gastrointestinal: Negative for abdominal pain and vomiting.  Genitourinary: Negative for dysuria and flank  pain.  Musculoskeletal: Negative for back pain, neck pain and neck stiffness.  Skin: Negative for rash.  Neurological: Negative for light-headedness and headaches.     Physical Exam Updated Vital Signs BP (!) 183/124   Pulse 76   Temp 98.1 F (36.7 C) (Oral)   Resp (!) 29   SpO2 (!) 84%   Physical Exam  Constitutional: He is oriented to person, place, and time. He appears well-developed and well-nourished.  HENT:  Head: Normocephalic and atraumatic.  Eyes: Conjunctivae are normal. Right eye exhibits no discharge. Left eye exhibits no discharge.  Neck: Normal range of motion. Neck supple. No tracheal deviation present.  Cardiovascular: Regular rhythm.  Pulmonary/Chest: Effort normal and breath sounds normal.  Abdominal: Soft. He exhibits no distension. There is no tenderness. There is no guarding.  Musculoskeletal: He exhibits no edema.  Neurological: He is alert and oriented to person, place, and time. He has normal strength. He is not disoriented. No cranial nerve deficit. GCS eye subscore is 4. GCS verbal subscore is 5. GCS motor subscore is 6.  Skin: Skin is warm. No rash noted.  Psychiatric: He has a normal mood and affect.  Nursing note and vitals reviewed.    ED Treatments / Results  Labs (all labs ordered are listed, but only abnormal results are displayed) Labs Reviewed  BASIC METABOLIC PANEL - Abnormal; Notable for the following components:      Result Value   Glucose, Bld 104 (*)    All other components within normal limits  CBC - Abnormal; Notable for the following components:   RBC 5.87 (*)    MCV 69.8 (*)    MCH 24.5 (*)    All other components within normal limits  BRAIN NATRIURETIC PEPTIDE - Abnormal; Notable for the following components:   B Natriuretic Peptide 137.2 (*)    All other components within normal limits  URINALYSIS, ROUTINE W REFLEX MICROSCOPIC - Abnormal; Notable for the following components:   Protein, ur >=300 (*)    Bacteria, UA RARE  (*)    All other components within normal limits  I-STAT TROPONIN, ED - Abnormal; Notable for the following components:   Troponin i, poc 0.10 (*)    All other components within normal limits  BASIC METABOLIC PANEL  CBC  LIPID PANEL  TSH  TROPONIN I  TROPONIN I  TROPONIN I    EKG  EKG Interpretation  Date/Time:  Saturday September 09 2017 14:49:13 EST Ventricular Rate:  106 PR Interval:  154 QRS Duration: 90 QT Interval:  348 QTC Calculation: 462 R Axis:   112 Text Interpretation:  Sinus tachycardia Right axis deviation Nonspecific ST abnormality Abnormal QRS-T angle, consider primary T wave abnormality Abnormal ECG Confirmed by Blane Ohara [phone]) on 09/09/2017 4:52:16 PM       Radiology Dg Chest 2 View  Result Date: 09/09/2017 CLINICAL DATA:  Short of  breath, LEFT-sided chest pain EXAM: CHEST  2 VIEW COMPARISON:  11/19/2016, CT 11/01/2015 FINDINGS: Normal cardiac silhouette. Subtle reticulonodular pattern in the lower lobes not changed from prior. Greater density on the RIGHT. Upper lungs clear. No pleural effusion. IMPRESSION: Chronic reticulonodular densities in the lower lobes, RIGHT greater than LEFT represent chronic infection or edema. Electronically Signed   By: Genevive Bi M.D.   On: 09/09/2017 15:51    Procedures .Critical Care Performed by: Blane Ohara, MD Authorized by: Blane Ohara, MD   Critical care provider statement:    Critical care time (minutes):  40   Critical care start time:  09/09/2017 4:50 PM   Critical care end time:  09/09/2017 5:30 PM   Critical care was necessary to treat or prevent imminent or life-threatening deterioration of the following conditions:  Cardiac failure   Critical care was time spent personally by me on the following activities:  Ordering and review of radiographic studies, ordering and review of laboratory studies and examination of patient   I assumed direction of critical care for this patient from another  provider in my specialty: no     (including critical care time)  Medications Ordered in ED Medications  atorvastatin (LIPITOR) tablet 20 mg (20 mg Oral Given 09/09/17 2144)  sodium chloride flush (NS) 0.9 % injection 3 mL (not administered)  sodium chloride flush (NS) 0.9 % injection 3 mL (not administered)  0.9 %  sodium chloride infusion (not administered)  acetaminophen (TYLENOL) tablet 650 mg (not administered)  ondansetron (ZOFRAN) injection 4 mg (not administered)  furosemide (LASIX) injection 60 mg (not administered)  nitroGLYCERIN 50 mg in dextrose 5 % 250 mL (0.2 mg/mL) infusion (100 mcg/min Intravenous Rate/Dose Change 09/09/17 2220)  labetalol (NORMODYNE,TRANDATE) injection 20 mg (not administered)  labetalol (NORMODYNE,TRANDATE) injection 20 mg (20 mg Intravenous Given 09/09/17 1648)  furosemide (LASIX) injection 40 mg (40 mg Intravenous Given 09/09/17 1706)  aspirin chewable tablet 324 mg (324 mg Oral Given 09/09/17 1706)  lidocaine-EPINEPHrine (XYLOCAINE W/EPI) 2 %-1:200000 (PF) injection 20 mL (20 mLs Infiltration Given 09/09/17 1744)  nitroGLYCERIN (NITROGLYN) 2 % ointment 1 inch (1 inch Topical Given 09/09/17 1744)  nitroGLYCERIN 50 mg in dextrose 5 % 250 mL (0.2 mg/mL) infusion (0 mcg/min Intravenous Stopped 09/09/17 1817)  labetalol (NORMODYNE,TRANDATE) injection 20 mg (20 mg Intravenous Given 09/09/17 1759)     Initial Impression / Assessment and Plan / ED Course  I have reviewed the triage vital signs and the nursing notes.  Pertinent labs & imaging results that were available during my care of the patient were reviewed by me and considered in my medical decision making (see chart for details).    Patient presents with elevated blood pressure and nonspecific chest pressure. Troponin minimal elevation likely secondary to elevated blood pressure for 3 days. IV blood pressure medications required to lower blood pressure. Nitroglycerin ordered as well. Aspirin given. He has  normal neurologic exam. Discussed with [place] [facility] for stepdown admission. Depending on how he responds and repeat vitals we may start a nitro drip.  The patients results and plan were reviewed and discussed.   Any x-rays performed were independently reviewed by myself.   Differential diagnosis were considered with the presenting HPI.  Medications  atorvastatin (LIPITOR) tablet 20 mg (20 mg Oral Given 09/09/17 2144)  sodium chloride flush (NS) 0.9 % injection 3 mL (not administered)  sodium chloride flush (NS) 0.9 % injection 3 mL (not administered)  0.9 %  sodium chloride infusion (not administered)  acetaminophen (TYLENOL) tablet 650 mg (not administered)  ondansetron (ZOFRAN) injection 4 mg (not administered)  furosemide (LASIX) injection 60 mg (not administered)  nitroGLYCERIN 50 mg in dextrose 5 % 250 mL (0.2 mg/mL) infusion (100 mcg/min Intravenous Rate/Dose Change 09/09/17 2220)  labetalol (NORMODYNE,TRANDATE) injection 20 mg (not administered)  labetalol (NORMODYNE,TRANDATE) injection 20 mg (20 mg Intravenous Given 09/09/17 1648)  furosemide (LASIX) injection 40 mg (40 mg Intravenous Given 09/09/17 1706)  aspirin chewable tablet 324 mg (324 mg Oral Given 09/09/17 1706)  lidocaine-EPINEPHrine (XYLOCAINE W/EPI) 2 %-1:200000 (PF) injection 20 mL (20 mLs Infiltration Given 09/09/17 1744)  nitroGLYCERIN (NITROGLYN) 2 % ointment 1 inch (1 inch Topical Given 09/09/17 1744)  nitroGLYCERIN 50 mg in dextrose 5 % 250 mL (0.2 mg/mL) infusion (0 mcg/min Intravenous Stopped 09/09/17 1817)  labetalol (NORMODYNE,TRANDATE) injection 20 mg (20 mg Intravenous Given 09/09/17 1759)    Vitals:   09/09/17 2056 09/09/17 2100 09/09/17 2130 09/09/17 2200  BP: (!) 183/117 (!) 156/104 (!) 192/130 (!) 183/124  Pulse: 94 88 85 76  Resp: 20 (!) 22 (!) 23 (!) 29  Temp:      TempSrc:      SpO2: 96% 90% (!) 85% (!) 84%    Final diagnoses:  Hypertensive emergency  Chest pressure    Admission/ observation  were discussed with the admitting physician, patient and/or family and they are comfortable with the plan.    Final Clinical Impressions(s) / ED Diagnoses   Final diagnoses:  Hypertensive emergency  Chest pressure    ED Discharge Orders    None       Blane Ohara, MD 09/09/17 2239

## 2017-09-09 NOTE — H&P (Signed)
History and Physical    Paul Lamb WJX:[id number] DOB: [birth year] DOA: 09/09/2017  PCP: Marcine Matar, MD  Patient coming from:Home  Chief Complaint: Headache, chest discomfort and not feeling well.  HPI: Paul Lamb is a 32 y.o. male with medical history significant of morbidly obese, OSA, hypertension, chronic systolic congestive heart failure, who works as a [employer] presented with headache diffuse associated with not feeling well and chest discomfort for last 3 days.  Patient reported that he was running out of his antihypertensive medication and did not take any medicine for last 3 days except hydralazine.  Yesterday morning he took Motrin with some relief in his headache.  This morning the pain is more severe and decided to come to hospital.  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  Denied fever, chills, cough, runny nose, abdominal pain.  Patient noticed worsening lower extremity edema and dyspnea on exertion.  ED Course: In the ER patient was found to have elevated blood pressure, systolic to 50s associated with lower extremity edema.  Treated with IV labetalol and Lasix.  Requested to admit for further evaluation.  After I discussed with the ER, starting nitro drip.  Review of Systems: As per HPI otherwise 10 point review of systems negative.    Past Medical History:  Diagnosis Date  . Acute combined systolic and diastolic CHF, NYHA class 3 (HCC) 11/18/2016   EF now 40-45% by echo  . Acute diastolic (congestive) heart failure (HCC) 01/12/2016  . Hypertension   . Morbid obesity (HCC)   . OSA (obstructive sleep apnea)     Past Surgical History:  Procedure Laterality Date  . NO PAST SURGERIES      Social history reports that  has never smoked. he has never used smokeless tobacco. He reports that he does not drink alcohol or use drugs.  Allergies  Allergen Reactions  . Penicillins Other (See Comments)     Family History  Problem Relation Age of Onset  . Hypertension Unknown   . Diabetes Mellitus II Unknown      Prior to Admission medications   Medication Sig Start Date End Date Taking? Authorizing Provider  amLODipine (NORVASC) 10 MG tablet Take 1 tablet (10 mg total) by mouth daily. 05/19/17 06/18/17  Marcine Matar, MD  aspirin EC 81 MG EC tablet Take 1 tablet (81 mg total) by mouth daily. 11/22/16   Johnson, Clanford L, MD  atorvastatin (LIPITOR) 20 MG tablet Take 1 tablet (20 mg total) by mouth daily at 6 PM. 05/19/17   Marcine Matar, MD  furosemide (LASIX) 40 MG tablet Take 2 tablets (80 mg total) by mouth daily. Must have office visit for refills 05/19/17 07/18/17  Marcine Matar, MD  hydrALAZINE (APRESOLINE) 25 MG tablet Take 2 tablets (50 mg total) by mouth 3 (three) times daily. 05/19/17 06/18/17  Marcine Matar, MD  labetalol (NORMODYNE) 200 MG tablet Take 1 tablet (200 mg total) by mouth 2 (two) times daily. 05/19/17 06/18/17  Marcine Matar, MD  lisinopril (PRINIVIL,ZESTRIL) 20 MG tablet Take 1 tablet (20 mg total) by mouth daily. 05/19/17 06/18/17  Marcine Matar, MD  metFORMIN (GLUCOPHAGE) 500 MG tablet Take 1 tablet (500 mg total) by mouth 2 (two) times daily with a meal. 05/19/17   Marcine Matar, MD  potassium chloride (K-DUR) 10 MEQ tablet Take 1 tablet (10 mEq total) by mouth daily. 05/19/17 06/18/17  [name]  B, MD  sertraline (ZOLOFT) 50 MG tablet Take 1/2 tab PO daily x 2 wks then 1 tab PO daily 05/19/17   Marcine Matar, MD  sildenafil (VIAGRA) 100 MG tablet Take 1/2-1 tab PO 30 mins before intercourse PRN. Limit to 100 mg/24 hrs 06/07/17   Marcine Matar, MD    Physical Exam: Vitals:   09/09/17 1453 09/09/17 1630 09/09/17 1700 09/09/17 1742  BP: (!) 225/140 (!) 228/161 (!) 223/138 (!) 245/150  Pulse: (!) 104 100 95   Resp: (!) 22 (!) 25 (!) 24 (!) 31  Temp: 98.1 F (36.7 C)     TempSrc: Oral     SpO2: 96% 95% 96%        Constitutional: Morbidly obese male sitting on bed, not in distress Vitals:   09/09/17 1453 09/09/17 1630 09/09/17 1700 09/09/17 1742  BP: (!) 225/140 (!) 228/161 (!) 223/138 (!) 245/150  Pulse: (!) 104 100 95   Resp: (!) 22 (!) 25 (!) 24 (!) 31  Temp: 98.1 F (36.7 C)     TempSrc: Oral     SpO2: 96% 95% 96%    Eyes: PERRL, lids and conjunctivae normal ENMT: Mucous membranes are moist. Normal dentition.  Neck: normal, supple Respiratory: Bibasilar decreased breath sound, no wheezing. Normal respiratory effort. No accessory muscle use.  Cardiovascular: Regular rate and rhythm, S1S2 Normal. no murmurs / rubs / gallops.  Lateral lower extremities edema. Abdomen: Soft, Non-tender, non-distended. Bowel sounds positive.  Musculoskeletal: no clubbing / cyanosis. No joint deformity upper and lower extremities.  Skin: no rashes, lesions, ulcers. No induration Neurologic: CN 2-12 grossly intact. Sensation intact.  Strength 5/5 in all 4.  Psychiatric: Normal judgment and insight. Alert and oriented x 3. Normal mood.    Labs on Admission: I have personally reviewed following labs and imaging studies  CBC: Recent Labs  Lab 09/09/17 1512  WBC 9.6  HGB 14.4  HCT 41.0  MCV 69.8*  PLT 228   Basic Metabolic Panel: Recent Labs  Lab 09/09/17 1512  NA 138  K 3.9  CL 101  CO2 26  GLUCOSE 104*  BUN 8  CREATININE 1.24  CALCIUM 9.2   GFR: CrCl cannot be calculated (Unknown ideal weight.). Liver Function Tests: No results for input(s): AST, ALT, ALKPHOS, BILITOT, PROT, ALBUMIN in the last 168 hours. No results for input(s): LIPASE, AMYLASE in the last 168 hours. No results for input(s): AMMONIA in the last 168 hours. Coagulation Profile: No results for input(s): INR, PROTIME in the last 168 hours. Cardiac Enzymes: No results for input(s): CKTOTAL, CKMB, CKMBINDEX, TROPONINI in the last 168 hours. BNP (last 3 results) No results for input(s): PROBNP in the last 8760  hours. HbA1C: No results for input(s): HGBA1C in the last 72 hours. CBG: No results for input(s): GLUCAP in the last 168 hours. Lipid Profile: No results for input(s): CHOL, HDL, LDLCALC, TRIG, CHOLHDL, LDLDIRECT in the last 72 hours. Thyroid Function Tests: No results for input(s): TSH, T4TOTAL, FREET4, T3FREE, THYROIDAB in the last 72 hours. Anemia Panel: No results for input(s): VITAMINB12, FOLATE, FERRITIN, TIBC, IRON, RETICCTPCT in the last 72 hours. Urine analysis:    Component Value Date/Time   COLORURINE AMBER (A) 11/01/2015 1449   APPEARANCEUR CLEAR 11/01/2015 1449   LABSPEC 1.040 (H) 11/01/2015 1449   PHURINE 6.5 11/01/2015 1449   GLUCOSEU NEGATIVE 11/01/2015 1449   HGBUR NEGATIVE 11/01/2015 1449   BILIRUBINUR NEGATIVE 11/01/2015 1449   KETONESUR NEGATIVE 11/01/2015 1449   PROTEINUR >300 (  A) 11/01/2015 1449   NITRITE NEGATIVE 11/01/2015 1449   LEUKOCYTESUR NEGATIVE 11/01/2015 1449   Sepsis Labs: !!!!!!!!!!!!!!!!!!!!!!!!!!!!!!!!!!!!!!!!!!!! @LABRCNTIP (procalcitonin:4,lacticidven:4) )No results found for this or any previous visit (from the past 240 hour(s)).   Radiological Exams on Admission: Dg Chest 2 View  Result Date: 09/09/2017 CLINICAL DATA:  Short of breath, LEFT-sided chest pain EXAM: CHEST  2 VIEW COMPARISON:  11/19/2016, CT 11/01/2015 FINDINGS: Normal cardiac silhouette. Subtle reticulonodular pattern in the lower lobes not changed from prior. Greater density on the RIGHT. Upper lungs clear. No pleural effusion. IMPRESSION: Chronic reticulonodular densities in the lower lobes, RIGHT greater than LEFT represent chronic infection or edema. Electronically Signed   By: Genevive Bi M.D.   On: 09/09/2017 15:51    EKG: Independently reviewed.  Sinus rhythm with nonspecific ST-T wave change.  Assessment/Plan Active Problems:   Hypertensive emergency   Morbid obesity, unspecified obesity type (HCC)   CHF (congestive heart failure) (HCC)  #Hypertensive  emergency setting of noncompliance with home oral medications.: Patient with elevated blood pressure associated with chest discomfort and headache.  Patient denies chest pain during my examination.  Repeat blood pressure was still elevated at 245 systolic.  I have discussed with the ER physician and is starting nitro drip.  Patient will be admitted to stepdown unit to monitor blood pressure. -Holding oral medication for now.  Once blood pressure is better controlled, plan to resume home oral medication.  Education provided to the patient regarding importance of taking medications.  He verbalized understanding. -Hold aspirin and anticoagulation tonight while having elevated blood pressure.  #Acute on chronic systolic heart failure: Patient has lower extremity edema, minimal elevation in BNP.  Plan to continue IV Lasix 60 mg twice a day.  Monitor BMP.  Repeat echo.  #Elevated troponin due to hypertensive emergency and CHF: Trend troponin.  Check echocardiogram.  Cardiology consulted for the management including hypertensive emergency.  Discussed with the cardiologist Dr Allena Earing.  #Type 2 diabetes in obese patient: Holding metformin.  Continue sliding scale.  Check A1c level.  #Hyperlipidemia: Continue Lipitor.  Check lipid panel in a.m.  #Headache diffuse in the setting of hypertensive emergency: Patient does not have any focal neurological deficit.  Reported headache is improving after receiving Lasix and labetalol in the ER.  Continue to monitor.  Tylenol as needed.   DVT prophylaxis: SCD Code Status: Full code Family Communication: Discussed with the patient's wife at ER Disposition Plan: Stepdown unit. Consults called: Cardiologist. Admission status:   Paul Lamb Jaynie Collins MD [facility] Pager [phone]  If 7PM-7AM, please contact night-coverage [email or web address] Password [facility]  09/09/2017, 6:04 PM

## 2017-09-09 NOTE — ED Notes (Addendum)
Spoke with admitting Md regarding pt's increased pressure on Nitro; New order placed will increase Nitro to 100 mcg/min; RN asked about getting a Cardene drip instead but Md did not want to make changes at this time; Charge RN notified of pt's condition due to being in holding unit in Ed; Pt remains a&ox 4 and denies pain; RN will continue to monitor and update Md with any changes needed-Monique,RN

## 2017-09-09 NOTE — ED Notes (Signed)
RN attempted blood draw; Phlembotomy to obtain blood-Monique,RN

## 2017-09-09 NOTE — Consult Note (Signed)
Cardiology Consultation:   Patient ID: Paul Lamb [facility]; [id number]; Aug 25, 1985   Admit date: 09/09/2017 Date of Consult: 09/09/2017  Primary Care Provider: Marcine Matar, MD Primary Cardiologist: Dr. Allyson Sabal  Primary Electrophysiologist:  None   Patient Profile:   Paul Lamb is a 32 y.o. male with a hx of morbid obesity, HFrEF (EF 40-45%), OSA and difficult-to-control HTN who is being seen today for the evaluation of hypertensive emergency and elevated troponin at the request of Dr. Ronalee Belts.  History of Present Illness:   Paul Lamb is a 32 year old man with a history of morbid obesity, HFrEF (EF 40-45%), OSA and difficult-to-control HTN who presented to the ED with hypertensive emergency.  The patient was hospitalized in April 2018 for heart failure exacerbation and hypertensive urgency. He was evaluated by cardiology during this admission, who helped manage BP and recommended diuresis. His echocardiogram was reviewed, and outpatient ichemic evaluation was recommended. He was last seen in clinic on 12/06/2016 for post-hospital follow up at which time he was noted to by hypertensive to 172/124 but reported a stable weight and medication compliance. His hydralazine was increased to 50 mg TID.   The patient ran out of several of his blood pressure medications (labetolol, lisinopril, amlodipine) 3 days ago and was unable to fill them due to work obligations. He reports that over this time period, he developedheadaches and chest discomfort. He also noted worsening dyspnea and some LE edema as well. Given the persistence of his symptoms, he presented to the ED.  In the ED, the patient was hypertensive to 245/150 with HR 104. ECG showed NSR with RAD and nonspecific ST changes. Troponin was elevated at 0.1, and BNP was elevated at 137 (was 384 on last check). He was started on a NTG infusion and admitted to the hospitalist service, who consulted cardiology for further management. On my  evaluation, the patient reports resolution of his chest pain and headache and denies any active complaints.   Past Medical History:  Diagnosis Date  . Acute combined systolic and diastolic CHF, NYHA class 3 (HCC) 11/18/2016   EF now 40-45% by echo  . Acute diastolic (congestive) heart failure (HCC) 01/12/2016  . Hypertension   . Morbid obesity (HCC)   . OSA (obstructive sleep apnea)     Past Surgical History:  Procedure Laterality Date  . NO PAST SURGERIES       Home Medications:  Prior to Admission medications   Medication Sig Start Date End Date Taking? Authorizing Provider  amLODipine (NORVASC) 10 MG tablet Take 1 tablet (10 mg total) by mouth daily. 05/19/17 09/09/17 Yes Marcine Matar, MD  aspirin EC 81 MG EC tablet Take 1 tablet (81 mg total) by mouth daily. 11/22/16  Yes Johnson, Clanford L, MD  atorvastatin (LIPITOR) 20 MG tablet Take 1 tablet (20 mg total) by mouth daily at 6 PM. 05/19/17  Yes Marcine Matar, MD  furosemide (LASIX) 40 MG tablet Take 2 tablets (80 mg total) by mouth daily. Must have office visit for refills 05/19/17 09/09/17 Yes Marcine Matar, MD  hydrALAZINE (APRESOLINE) 25 MG tablet Take 2 tablets (50 mg total) by mouth 3 (three) times daily. 05/19/17 09/09/17 Yes Marcine Matar, MD  labetalol (NORMODYNE) 200 MG tablet Take 1 tablet (200 mg total) by mouth 2 (two) times daily. 05/19/17 09/09/17 Yes Marcine Matar, MD  lisinopril (PRINIVIL,ZESTRIL) 20 MG tablet Take 1 tablet (20 mg total) by mouth daily. 05/19/17 09/09/17 Yes [name]  B, MD  metFORMIN (GLUCOPHAGE) 500 MG tablet Take 1 tablet (500 mg total) by mouth 2 (two) times daily with a meal. 05/19/17  Yes Marcine Matar, MD  potassium chloride (K-DUR) 10 MEQ tablet Take 1 tablet (10 mEq total) by mouth daily. 05/19/17 09/09/17 Yes Marcine Matar, MD  sertraline (ZOLOFT) 50 MG tablet Take 1/2 tab PO daily x 2 wks then 1 tab PO daily Patient taking differently: Take 50 mg by mouth daily.   05/19/17  Yes Marcine Matar, MD  sildenafil (VIAGRA) 100 MG tablet Take 1/2-1 tab PO 30 mins before intercourse PRN. Limit to 100 mg/24 hrs 06/07/17  Yes Marcine Matar, MD    Inpatient Medications: Scheduled Meds: . atorvastatin  20 mg Oral q1800  . [START ON 09/10/2017] furosemide  60 mg Intravenous Q12H  . sodium chloride flush  3 mL Intravenous Q12H   Continuous Infusions: . sodium chloride    . nitroGLYCERIN 60 mcg/min (09/09/17 1817)   PRN Meds: sodium chloride, acetaminophen, ondansetron (ZOFRAN) IV, sodium chloride flush  Allergies:    Allergies  Allergen Reactions  . Penicillins Other (See Comments)    Social History:   Social History   Socioeconomic History  . Marital status: Married    Spouse name: Not on file  . Number of children: Not on file  . Years of education: Not on file  . Highest education level: Not on file  Social Needs  . Financial resource strain: Not on file  . Food insecurity - worry: Not on file  . Food insecurity - inability: Not on file  . Transportation needs - medical: Not on file  . Transportation needs - non-medical: Not on file  Occupational History  . Not on file  Tobacco Use  . Smoking status: Never Smoker  . Smokeless tobacco: Never Used  Substance and Sexual Activity  . Alcohol use: No    Alcohol/week: 0.0 oz  . Drug use: No  . Sexual activity: Not on file  Other Topics Concern  . Not on file  Social History Narrative  . Not on file    Family History:    Family History  Problem Relation Age of Onset  . Hypertension Unknown   . Diabetes Mellitus II Unknown      ROS:  All other ROS reviewed and negative.     Physical Exam/Data:   Vitals:   09/09/17 1630 09/09/17 1700 09/09/17 1742 09/09/17 1800  BP: (!) 228/161 (!) 223/138 (!) 245/150 (!) 200/116  Pulse: 100 95  91  Resp: (!) 25 (!) 24 (!) 31 (!) 31  Temp:      TempSrc:      SpO2: 95% 96%  (!) 89%   No intake or output data in the 24 hours ending  09/09/17 1834 There were no vitals filed for this visit. There is no height or weight on file to calculate BMI.  General:  Very obese. NAD.   HEENT: normal Neck: JVD difficult to assess given habitus Cardiac:  normal S1, S2; RRR; no murmur Lungs:  clear to auscultation bilaterally, no wheezing, rhonchi or rales  Abd: soft, nontender, no hepatomegaly  Ext: 1-2+ pitting LE edema  Musculoskeletal:  No deformities, BUE and BLE strength normal and equal Skin: warm and dry  Neuro:  no focal abnormalities noted Psych:  Normal affect   EKG:  The EKG was personally reviewed and demonstrates: NSR with RAD and nonspecific ST changes. Telemetry:  Telemetry was personally reviewed and  demonstrates:  Sinus rhythm   Relevant CV Studies: TTE 11/19/2016: Study Conclusions - Procedure narrative: Transthoracic echocardiography. Image   quality was poor. Intravenous contrast (Definity) was   administered. - Left ventricle: The cavity size was normal. Wall thickness was   increased in a pattern of moderate LVH. Systolic function was   mildly to moderately reduced. The estimated ejection fraction was   in the range of 40% to 45%. Inferior wall hypokinesis. The study   is not technically sufficient to allow evaluation of LV diastolic   function. - Left atrium: The atrium was normal in size. - Tricuspid valve: There was mild regurgitation. - Pulmonary arteries: PA peak pressure: 37 mm Hg (S). - Inferior vena cava: The vessel was dilated. The respirophasic   diameter changes were blunted (< 50%), consistent with elevated   central venous pressure. - Pericardium, extracardiac: Small pericardial effusion. Study not   adequate to evaluate for tamponade physiology - clinical   correlation is advised.  Impressions:  - Compared to a prior study in 2017, the LVEF is lower at 40-45%   with new inferior hypokinesis.  Laboratory Data:  Chemistry Recent Labs  Lab 09/09/17 1512  NA 138  K 3.9  CL  101  CO2 26  GLUCOSE 104*  BUN 8  CREATININE 1.24  CALCIUM 9.2  GFRNONAA >60  GFRAA >60  ANIONGAP 11    No results for input(s): PROT, ALBUMIN, AST, ALT, ALKPHOS, BILITOT in the last 168 hours. Hematology Recent Labs  Lab 09/09/17 1512  WBC 9.6  RBC 5.87*  HGB 14.4  HCT 41.0  MCV 69.8*  MCH 24.5*  MCHC 35.1  RDW 14.6  PLT 228   Cardiac EnzymesNo results for input(s): TROPONINI in the last 168 hours.  Recent Labs  Lab 09/09/17 1543  TROPIPOC 0.10*    BNP Recent Labs  Lab 09/09/17 1547  BNP 137.2*    DDimer No results for input(s): DDIMER in the last 168 hours.  Radiology/Studies:  Dg Chest 2 View  Result Date: 09/09/2017 CLINICAL DATA:  Short of breath, LEFT-sided chest pain EXAM: CHEST  2 VIEW COMPARISON:  11/19/2016, CT 11/01/2015 FINDINGS: Normal cardiac silhouette. Subtle reticulonodular pattern in the lower lobes not changed from prior. Greater density on the RIGHT. Upper lungs clear. No pleural effusion. IMPRESSION: Chronic reticulonodular densities in the lower lobes, RIGHT greater than LEFT represent chronic infection or edema. Electronically Signed   By: Genevive Bi M.D.   On: 09/09/2017 15:51    Assessment and Plan:    Mr. Trahan is a 31 year old man with a history of morbid obesity, HFrEF (EF 40-45%), OSA and difficult-to-control HTN who presented to the ED with hypertensive emergency.  Hypertensive Emergency The patient has a history of difficult-to-control HTN and presents with headaches and chest discomfort in the setting of antihypertensive non-compliance. BP in ED profoundly elevated, which makes clinical picture consistent with hypertensive emergency. NTG infusion was started in the ED. At this time, continued lowering of BP with NTG infusion is appropriate, and transition to home medications should be initiated when BP is improved. -Continue NTG infusion with wean as tolerated. Would avoid drastic drops in blood pressure to avoid cerebral  hypoperfusion -Recommend transition of IV therapy to oral home antihypertensive medications when BP has improved.  -Continue diuresis and treatment of HF, as below  HFrEF The patient has a history of diastolic heart failure, although most recent echocardiogram shows mild reduction in systolic function with possible WMA, although this  finding was debated during last admission. Tonight the patient reports weight gain and worsening dyspnea. His volume status is difficult to determine in the setting of his morbid obesity, and BNP is elevated (although not as high as prior admission), which is likely artificially decreased due to his obesity. Regardless, it is likely that the patient would benefit from ongoing diuresis, which has been initiated by the primary team. He should have his mortality reducing HF antihypertensives started as he transitions off of the NTG infusion. -Agree with IV diuresis with furosemide 60 mg IV BID at this time -Repeat echocardiogram has been ordered  Positive troponin Troponin is mildly elevated on admission, which likely represents demand ischemia in the setting of his hypertensive emergency. He had some transient CP that has resolved, and ECG shows nonspecific changes without signs of acute MI. Ischemic evaluation was discussed during his prior hospitalization, although this was deferred until outpatient follow up. At this time, the patient should have ongoing management of the above insults. More immediate ischemic evaluation can be considered if he has a significant rise in troponin or more concerning symptoms. -Continue to trend troponin until peak/plateau -Repeat ECG for recurrent CP -Continue management of HTN and HF as above  For questions or updates, please contact [facility] Please consult [email or web address] for contact info under Cardiology/STEMI.   Signed, Ernest Mallick, MD  09/09/2017 6:34 PM

## 2017-09-09 NOTE — ED Notes (Signed)
Nurse 1st Roseanne Reno, RN notified of elevated I stat troponin results.

## 2017-09-09 NOTE — ED Triage Notes (Addendum)
To ED for eval of HA for past 3 days. States he ran out of some of his bp meds 3 days ago and wasn't able to get filled yet. Dx 2 yrs ago with CHF. States he has been having cp since HA started as well. No vomiting. Minimal sob per pt. Sleeps sitting up since dx of CHF. Increased swelling in BLE

## 2017-09-10 ENCOUNTER — Encounter (HOSPITAL_COMMUNITY): Payer: Self-pay

## 2017-09-10 ENCOUNTER — Other Ambulatory Visit (HOSPITAL_COMMUNITY): Payer: Self-pay

## 2017-09-10 DIAGNOSIS — R778 Other specified abnormalities of plasma proteins: Secondary | ICD-10-CM

## 2017-09-10 DIAGNOSIS — R7989 Other specified abnormal findings of blood chemistry: Secondary | ICD-10-CM

## 2017-09-10 LAB — LIPID PANEL
CHOL/HDL RATIO: 7.6 ratio
Cholesterol: 295 mg/dL — ABNORMAL HIGH (ref 0–200)
HDL: 39 mg/dL — AB (ref >40)
LDL CALC: 227 mg/dL — AB (ref 0–99)
TRIGLYCERIDES: 145 mg/dL (ref <150)
VLDL: 29 mg/dL (ref 0–40)

## 2017-09-10 LAB — CBC
HEMATOCRIT: 41.8 % (ref 39.0–52.0)
Hemoglobin: 14.4 g/dL (ref 13.0–17.0)
MCH: 24.3 pg — ABNORMAL LOW (ref 26.0–34.0)
MCHC: 34.4 g/dL (ref 30.0–36.0)
MCV: 70.5 fL — ABNORMAL LOW (ref 78.0–100.0)
PLATELETS: 236 10*3/uL (ref 150–400)
RBC: 5.93 MIL/uL — ABNORMAL HIGH (ref 4.22–5.81)
RDW: 15.2 % (ref 11.5–15.5)
WBC: 11 10*3/uL — AB (ref 4.0–10.5)

## 2017-09-10 LAB — BASIC METABOLIC PANEL
Anion gap: 13 (ref 5–15)
BUN: 9 mg/dL (ref 6–20)
CO2: 25 mmol/L (ref 22–32)
Calcium: 9.2 mg/dL (ref 8.9–10.3)
Chloride: 101 mmol/L (ref 101–111)
Creatinine, Ser: 1.18 mg/dL (ref 0.61–1.24)
GFR calc Af Amer: >60 mL/min (ref >60)
Glucose, Bld: 188 mg/dL — ABNORMAL HIGH (ref 65–99)
POTASSIUM: 4.5 mmol/L (ref 3.5–5.1)
Sodium: 139 mmol/L (ref 135–145)

## 2017-09-10 LAB — TROPONIN I
Troponin I: 0.06 ng/mL (ref <0.03)
Troponin I: 0.04 ng/mL (ref <0.03)

## 2017-09-10 MED ORDER — CARVEDILOL 25 MG PO TABS
25.0000 mg | ORAL_TABLET | Freq: Two times a day (BID) | ORAL | Status: DC
  Administered 2017-09-10 – 2017-09-14 (×8): 25 mg via ORAL
  Filled 2017-09-10 (×8): qty 1

## 2017-09-10 MED ORDER — LIVING BETTER WITH HEART FAILURE BOOK: Freq: Once | Status: DC

## 2017-09-10 MED ORDER — FUROSEMIDE 10 MG/ML IJ SOLN
80.0000 mg | Freq: Two times a day (BID) | INTRAMUSCULAR | Status: DC
  Administered 2017-09-10 – 2017-09-11 (×2): 80 mg via INTRAVENOUS
  Filled 2017-09-10 (×2): qty 8

## 2017-09-10 MED ORDER — AMLODIPINE BESYLATE 10 MG PO TABS
10.0000 mg | ORAL_TABLET | Freq: Every day | ORAL | Status: DC
  Administered 2017-09-10 – 2017-09-14 (×5): 10 mg via ORAL
  Filled 2017-09-10 (×3): qty 1
  Filled 2017-09-10: qty 2
  Filled 2017-09-10: qty 1

## 2017-09-10 MED ORDER — HYDRALAZINE HCL 25 MG PO TABS
25.0000 mg | ORAL_TABLET | Freq: Three times a day (TID) | ORAL | Status: DC
  Administered 2017-09-10 – 2017-09-11 (×3): 25 mg via ORAL
  Filled 2017-09-10 (×3): qty 1

## 2017-09-10 MED ORDER — ATORVASTATIN CALCIUM 40 MG PO TABS
40.0000 mg | ORAL_TABLET | Freq: Every day | ORAL | Status: DC
  Filled 2017-09-10: qty 1

## 2017-09-10 MED ORDER — FUROSEMIDE 10 MG/ML IJ SOLN
40.0000 mg | Freq: Once | INTRAMUSCULAR | Status: AC
  Administered 2017-09-10: 40 mg via INTRAVENOUS
  Filled 2017-09-10: qty 4

## 2017-09-10 MED ORDER — ATORVASTATIN CALCIUM 80 MG PO TABS
80.0000 mg | ORAL_TABLET | Freq: Every day | ORAL | Status: DC
  Administered 2017-09-10 – 2017-09-13 (×4): 80 mg via ORAL
  Filled 2017-09-10 (×4): qty 1

## 2017-09-10 MED ORDER — ASPIRIN EC 81 MG PO TBEC
81.0000 mg | DELAYED_RELEASE_TABLET | Freq: Every day | ORAL | Status: DC
  Administered 2017-09-10 – 2017-09-14 (×5): 81 mg via ORAL
  Filled 2017-09-10 (×5): qty 1

## 2017-09-10 MED ORDER — LABETALOL HCL 200 MG PO TABS
200.0000 mg | ORAL_TABLET | Freq: Two times a day (BID) | ORAL | Status: DC
  Administered 2017-09-10: 200 mg via ORAL
  Filled 2017-09-10: qty 1

## 2017-09-10 NOTE — Progress Notes (Signed)
24-hour urine collection started at 5pm, collection container on ice in patient's room.

## 2017-09-10 NOTE — Progress Notes (Signed)
On arrival pt's BP was taken with a regular sized cuff on his forearm.  At time of med pass, BP taken with regular cuff on forearm BP 168/109, BP retaken with large cuff on upper left arm, BP 164/97.

## 2017-09-10 NOTE — ED Notes (Signed)
Breakfast tray set up for pt as requested and Ginger Ale given.

## 2017-09-10 NOTE — Progress Notes (Signed)
Patient arrived from the ED to 4E room 4.  Telemetry monitor applied and CCMD notified.  Patient oriented to unit and room to include call light and phone.  Will continue to monitor.

## 2017-09-10 NOTE — Care Management Note (Signed)
Case Management Note  Patient Details  Name: Paul Lamb MRN: [id number] Date of Birth: 1986-06-12  Subjective/Objective:  Received call from NS in ED. Pt will be admitted will defer to floor CM.                   Action/Plan:CM will follow closely for disposition/discharge needs.    Expected Discharge Date:                  Expected Discharge Plan:     In-House Referral:     Discharge planning Services     Post Acute Care Choice:    Choice offered to:     DME Arranged:    DME Agency:     HH Arranged:    HH Agency:     Status of Service:     If discussed at [employer], dates discussed:    Additional Comments:  Yvone Neu, RN 09/10/2017, 10:54 AM

## 2017-09-10 NOTE — Progress Notes (Signed)
Progress Note  Patient Name: Paul Lamb Date of Encounter: 09/10/2017  Primary Cardiologist: Dr. Allyson Sabal in 2017  Subjective   SOB and chest pain resolved. Does not have anyone following sleep apnea. Has headache from NTG gtt.  Inpatient Medications    Scheduled Meds: . amLODipine  10 mg Oral Daily  . atorvastatin  40 mg Oral q1800  . furosemide  80 mg Intravenous Q12H  . labetalol  200 mg Oral BID  . sodium chloride flush  3 mL Intravenous Q12H   Continuous Infusions: . sodium chloride    . nitroGLYCERIN 110 mcg/min (09/10/17 0702)   PRN Meds: sodium chloride, acetaminophen, ondansetron (ZOFRAN) IV, sodium chloride flush   Vital Signs    Vitals:   09/10/17 1130 09/10/17 1200 09/10/17 1247 09/10/17 1248  BP: (!) 160/110 (!) 181/118  (!) 143/71  Pulse: 87 83  91  Resp: (!) 26 (!) 29  (!) 22  Temp:   97.6 F (36.4 C) 97.6 F (36.4 C)  TempSrc:   Oral Oral  SpO2: (!) 88% 97%  90%    Intake/Output Summary (Last 24 hours) at 09/10/2017 1253 Last data filed at 09/10/2017 1222 Gross per 24 hour  Intake -  Output 1950 ml  Net -1950 ml   There were no vitals filed for this visit.  Telemetry    NSR - Personally Reviewed  Physical Exam   GEN: No acute distress, morbidly obese AAM HEENT: Normocephalic, atraumatic, sclera non-icteric. Neck: No JVD or bruits. Cardiac: RRR no murmurs, rubs, or gallops.  Radials/DP/PT 1+ and equal bilaterally.  Respiratory: Clear to auscultation bilaterally albeit diffusely diminished. Breathing is unlabored. GI: Soft, nontender, non-distended, BS +x 4. MS: no deformity. Extremities: No clubbing or cyanosis. Large baseline leg habitus, minimal pitting so difficult to assess if edema or habitus.  Neuro:  AAOx3. Follows commands. Psych:  Responds to questions appropriately with a normal affect.  Labs    Chemistry Recent Labs  Lab 09/09/17 1512 09/10/17 0451  NA 138 139  K 3.9 4.5  CL 101 101  CO2 26 25  GLUCOSE 104*  188*  BUN 8 9  CREATININE 1.24 1.18  CALCIUM 9.2 9.2  GFRNONAA >60 >60  GFRAA >60 >60  ANIONGAP 11 13     Hematology Recent Labs  Lab 09/09/17 1512 09/10/17 0451  WBC 9.6 11.0*  RBC 5.87* 5.93*  HGB 14.4 14.4  HCT 41.0 41.8  MCV 69.8* 70.5*  MCH 24.5* 24.3*  MCHC 35.1 34.4  RDW 14.6 15.2  PLT 228 236    Cardiac Enzymes Recent Labs  Lab 09/09/17 2207  TROPONINI 0.08*    Recent Labs  Lab 09/09/17 1543  TROPIPOC 0.10*     BNP Recent Labs  Lab 09/09/17 1547  BNP 137.2*     DDimer No results for input(s): DDIMER in the last 168 hours.   Radiology    Dg Chest 2 View  Result Date: 09/09/2017 CLINICAL DATA:  Short of breath, LEFT-sided chest pain EXAM: CHEST  2 VIEW COMPARISON:  11/19/2016, CT 11/01/2015 FINDINGS: Normal cardiac silhouette. Subtle reticulonodular pattern in the lower lobes not changed from prior. Greater density on the RIGHT. Upper lungs clear. No pleural effusion. IMPRESSION: Chronic reticulonodular densities in the lower lobes, RIGHT greater than LEFT represent chronic infection or edema. Electronically Signed   By: Genevive Bi M.D.   On: 09/09/2017 15:51    Cardiac Studies   Pending echo this admission  Patient Profile  32 y.o. male with a hx of super morbid obesity, HFrEF (EF 40-45%), OSA, and difficult-to-control HTN, noncompliance who was admitted with accelerated HTN in setting of medication noncompliance. Adm 11/2016 with CHF, HTN, AKI with Cr up to 2, EF 40-45% with inferior wall HK, small pericardial effusion, minimal troponin bump - per Dr. Erin Hearing note, coronary angiography could be considered in the future as noninvasive testing unlikely to be helpful given his habitus. Did not follow-up after first OV as instructed. Returned with SBP 245/150, HR 104, troponin elevated to 0.1.  Assessment & Plan    1. Accelerated HTN - in setting of noncompliance with BP meds. Will discuss down-titration of NTG in lieu of titrating oral  regimen with MD. Consider changing labetalol to carvedilol given his cardiomyopathy and HTN. Amlodipine is less ideal given his LV dysfunction - Entresto may be a good option although with his noncompliance I'm not sure about his candidacy for this. Amlodipine is less ideal given his LV dysfunction. Prior notes indicate recommendation to avoid nitrates as these typically give the patient headaches and in the past have caused him to stop taking his meds altogether. We discussed importance of medication compliance although he remains stoic during the interview. His wife seems quite concerned. He also needs treatment of his OSA as OP. Will also obtain 24-hr urine protein level given significant proteinuria on UA to exclude nephrotic process.  2. Acute on chronic combined CHF, also h/o small pericardial effusion - volume status near impossible to assess given his weight but he is diuresing and clinically improving. Cr stable therefore would continue today. Await echo. See above re: HF med titration. Will order dietician eval for nutrition education and rx CHF booklet. Add daily weights (no weight on file this admission). Change diet to 2g sodium/1812ml fluid restriction. We did discuss the expectation of high mortality of this disease if he continues with current trajectory of intermittent noncompliance and life-threatening obesity.  3. Elevated troponin - CP somewhat atypical, low flat troponin. As previously stated, not a candidate for noninvasive testing due to weight. May need cath but again his compliance raises question of utility of this. Will await echo. Start daily aspirin.  4. Super morbid obesity complicated by OSA and probable OHS with hypoxia this admission - recommend bariatric referral at discharge. 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 I'm surprised he's been allotted renewal given that he has sleep apnea but has no documentation of  compliance with CPAP as he has not been using it. May need to be out of work until this is arranged. Additionally if EF is <40% this will disqualify him for candidacy for driving for now. Patient was made aware of this concern.  5. Severe hyperlipidemia - LDL 227. Lipitor titrated this admission, may even need 80mg . Check 24-hr urine protein as above given proteinuria, possible sequelae of HLD.  For questions or updates, please contact [facility] Please consult [email or web address] for contact info under Cardiology/STEMI.  Signed, Laurann Montana, PA-C 09/10/2017, 12:53 PM

## 2017-09-10 NOTE — ED Notes (Addendum)
RN attempted new IV start; 2nd RN to start IV-Monique,RN

## 2017-09-10 NOTE — Progress Notes (Signed)
PROGRESS NOTE    Paul Lamb  ZOX:[id number] DOB: 1985-12-12 DOA: 09/09/2017 PCP: Marcine Matar, MD   Brief Narrative: 32 y.o. male with medical history significant of morbidly obese, OSA, hypertension, chronic systolic congestive heart failure, who works as a [employer] presented with headache diffuse associated with not feeling well and chest discomfort for 3 days.  Patient reported that he was running out of his antihypertensive medication and did not take any medicine for 3 days except hydralazine.   In the ER patient was found to have elevated blood pressure systolic more than 250s associated with lower extremity edema, started on nitro drip and admitted for further evaluation for hypertensive emergency.  Assessment & Plan:   #Hypertensive emergency setting of noncompliance with home oral medications.:  -Patient was a started on nitroglycerin drip, is still his blood pressure is more than 200s.  He denied chest pain however complaining of headache contributed by nitro drip.  Starting oral labetalol, amlodipine and increase the dose of Lasix to 80 mg IV twice a day.  Monitor blood pressure and labs closely.  Avoid sudden drop in blood pressure.  Discussed with the bedside nurse to keep systolic blood pressure around 160s.  Continue close monitoring. -Hold aspirin and anticoagulation tonight while having elevated blood pressure.  #Acute on chronic systolic heart failure: Patient has lower extremity edema, minimal elevation in BNP.   -Follow-up echocardiogram.  Increase the dose of Lasix to 60 twice a day.  #Elevated troponin due to hypertensive emergency and CHF: Trend troponin.  Check echocardiogram.   -Cardiology consult appreciated.  #Type 2 diabetes in obese patient: Holding metformin.  Continue sliding scale.  Check A1c level.  #Hyperlipidemia: Elevated LDL.  Increase the dose of Lipitor to 40 mg.  Patient was not compliant with the medication at home.  #Headache  diffuse in the setting of nitro drip.  Continue supportive care.  Tylenol as needed.  #Morbid obesity/OSA: CPAP order at bedtime.  Patient was recommended to follow-up with PCP and have sleep study after discharge.  He verbalized understanding.  DVT prophylaxis: SCD.  No anticoagulation because of significantly elevated blood pressure. Code Status: Full code Family Communication: No family at bedside.  Discussed with the patient wife yesterday. Disposition Plan: Going to stepdown unit.    Consultants:   Cardiology  Procedures: None Antimicrobials:  Subjective: Seen and examined at bedside.  Reported headache.  Denies chest pain or shortness of breath.  No nausea vomiting.  Objective: Vitals:   09/10/17 0830 09/10/17 0900 09/10/17 0930 09/10/17 1000  BP: (!) 201/135 (!) 202/124 (!) 208/144 (!) 166/107  Pulse: 93 80 78 86  Resp: (!) 26 (!) 25 (!) 34 (!) 30  Temp:      TempSrc:      SpO2: 92% 91% 92% 95%    Intake/Output Summary (Last 24 hours) at 09/10/2017 1035 Last data filed at 09/10/2017 0917 Gross per 24 hour  Intake -  Output 1450 ml  Net -1450 ml   There were no vitals filed for this visit.  Examination:  General exam: Very obese male sitting on bed, not in distress Respiratory system: Bibasilar decreased breath sound, respiratory effort normal. Cardiovascular system: S1 & S2 heard, RRR.  Bilateral pitting edema. Gastrointestinal system: Abdomen is nondistended, soft and nontender. Normal bowel sounds heard. Central nervous system: Alert and oriented. No focal neurological deficits. Extremities: Symmetric 5 x 5 power. Skin: No rashes, lesions or ulcers Psychiatry: Judgement and insight appear normal. Mood & affect  appropriate.     Data Reviewed: I have personally reviewed following labs and imaging studies  CBC: Recent Labs  Lab 09/09/17 1512 09/10/17 0451  WBC 9.6 11.0*  HGB 14.4 14.4  HCT 41.0 41.8  MCV 69.8* 70.5*  PLT 228 236   Basic Metabolic  Panel: Recent Labs  Lab 09/09/17 1512 09/10/17 0451  NA 138 139  K 3.9 4.5  CL 101 101  CO2 26 25  GLUCOSE 104* 188*  BUN 8 9  CREATININE 1.24 1.18  CALCIUM 9.2 9.2   GFR: CrCl cannot be calculated (Unknown ideal weight.). Liver Function Tests: No results for input(s): AST, ALT, ALKPHOS, BILITOT, PROT, ALBUMIN in the last 168 hours. No results for input(s): LIPASE, AMYLASE in the last 168 hours. No results for input(s): AMMONIA in the last 168 hours. Coagulation Profile: No results for input(s): INR, PROTIME in the last 168 hours. Cardiac Enzymes: Recent Labs  Lab 09/09/17 2207  TROPONINI 0.08*   BNP (last 3 results) No results for input(s): PROBNP in the last 8760 hours. HbA1C: No results for input(s): HGBA1C in the last 72 hours. CBG: No results for input(s): GLUCAP in the last 168 hours. Lipid Profile: Recent Labs    09/10/17 0451  CHOL 295*  HDL 39*  LDLCALC 227*  TRIG 145  CHOLHDL 7.6   Thyroid Function Tests: Recent Labs    09/09/17 2207  TSH 1.643   Anemia Panel: No results for input(s): VITAMINB12, FOLATE, FERRITIN, TIBC, IRON, RETICCTPCT in the last 72 hours. Sepsis Labs: No results for input(s): PROCALCITON, LATICACIDVEN in the last 168 hours.  No results found for this or any previous visit (from the past 240 hour(s)).       Radiology Studies: Dg Chest 2 View  Result Date: 09/09/2017 CLINICAL DATA:  Short of breath, LEFT-sided chest pain EXAM: CHEST  2 VIEW COMPARISON:  11/19/2016, CT 11/01/2015 FINDINGS: Normal cardiac silhouette. Subtle reticulonodular pattern in the lower lobes not changed from prior. Greater density on the RIGHT. Upper lungs clear. No pleural effusion. IMPRESSION: Chronic reticulonodular densities in the lower lobes, RIGHT greater than LEFT represent chronic infection or edema. Electronically Signed   By: Genevive Bi M.D.   On: 09/09/2017 15:51        Scheduled Meds: . amLODipine  10 mg Oral Daily  .  atorvastatin  40 mg Oral q1800  . furosemide  80 mg Intravenous Q12H  . labetalol  200 mg Oral BID  . sodium chloride flush  3 mL Intravenous Q12H   Continuous Infusions: . sodium chloride    . nitroGLYCERIN 110 mcg/min (09/10/17 0702)     LOS: 1 day    Dron Jaynie Collins, MD [facility] Pager [phone]  If 7PM-7AM, please contact night-coverage [email or web address] Password [email or web address] 09/10/2017, 10:35 AM

## 2017-09-11 ENCOUNTER — Inpatient Hospital Stay (HOSPITAL_COMMUNITY): Payer: Medicaid Other

## 2017-09-11 DIAGNOSIS — I371 Nonrheumatic pulmonary valve insufficiency: Secondary | ICD-10-CM

## 2017-09-11 LAB — BASIC METABOLIC PANEL
ANION GAP: 13 (ref 5–15)
BUN: 11 mg/dL (ref 6–20)
CALCIUM: 8.8 mg/dL — AB (ref 8.9–10.3)
CO2: 27 mmol/L (ref 22–32)
Chloride: 99 mmol/L — ABNORMAL LOW (ref 101–111)
Creatinine, Ser: 1.39 mg/dL — ABNORMAL HIGH (ref 0.61–1.24)
GFR calc Af Amer: >60 mL/min (ref >60)
GFR calc non Af Amer: >60 mL/min (ref >60)
GLUCOSE: 142 mg/dL — AB (ref 65–99)
Potassium: 3.7 mmol/L (ref 3.5–5.1)
Sodium: 139 mmol/L (ref 135–145)

## 2017-09-11 LAB — PROTEIN, URINE, 24 HOUR
Collection Interval-UPROT: 24 hours
PROTEIN, 24H URINE: 3384 mg/d — AB (ref 50–100)
PROTEIN, URINE: 141 mg/dL

## 2017-09-11 LAB — ECHOCARDIOGRAM COMPLETE
Height: 72 in
Weight: 6836.8 oz

## 2017-09-11 LAB — HEMOGLOBIN A1C
Hgb A1c MFr Bld: 6.9 % — ABNORMAL HIGH (ref 4.8–5.6)
Mean Plasma Glucose: 151.33 mg/dL

## 2017-09-11 MED ORDER — HYDRALAZINE HCL 50 MG PO TABS
50.0000 mg | ORAL_TABLET | Freq: Three times a day (TID) | ORAL | Status: DC
  Administered 2017-09-11 – 2017-09-12 (×4): 50 mg via ORAL
  Filled 2017-09-11 (×4): qty 1

## 2017-09-11 MED ORDER — FUROSEMIDE 10 MG/ML IJ SOLN
40.0000 mg | Freq: Two times a day (BID) | INTRAMUSCULAR | Status: DC
  Administered 2017-09-11 – 2017-09-12 (×2): 40 mg via INTRAVENOUS
  Filled 2017-09-11 (×2): qty 4

## 2017-09-11 MED ORDER — SPIRONOLACTONE 25 MG PO TABS
25.0000 mg | ORAL_TABLET | Freq: Every day | ORAL | Status: DC
  Administered 2017-09-11 – 2017-09-14 (×4): 25 mg via ORAL
  Filled 2017-09-11 (×4): qty 1

## 2017-09-11 NOTE — Progress Notes (Addendum)
Progress Note  Patient Name: Paul Lamb Date of Encounter: 09/11/2017  Primary Cardiologist: Dr. Allyson Sabal in 2017  Subjective   Continued headache - BP remains elevated overnight. Down 4 lbs and 3L negative.    Inpatient Medications    Scheduled Meds: . amLODipine  10 mg Oral Daily  . aspirin EC  81 mg Oral Daily  . atorvastatin  80 mg Oral q1800  . carvedilol  25 mg Oral BID WC  . furosemide  40 mg Intravenous Q12H  . hydrALAZINE  50 mg Oral Q8H  . Living Better with Heart Failure Book   Does not apply Once  . sodium chloride flush  3 mL Intravenous Q12H   Continuous Infusions: . sodium chloride    . nitroGLYCERIN 50 mcg/min (09/10/17 1531)   PRN Meds: sodium chloride, acetaminophen, ondansetron (ZOFRAN) IV, sodium chloride flush   Vital Signs    Vitals:   09/11/17 0600 09/11/17 0648 09/11/17 0803 09/11/17 0809  BP: (!) 167/113  (!) 170/108   Pulse: 72  88 89  Resp: 20  18   Temp:   97.8 F (36.6 C)   TempSrc:   Oral   SpO2: 94%  92%   Weight:  (!) 427 lb 4.8 oz (193.8 kg)    Height:        Intake/Output Summary (Last 24 hours) at 09/11/2017 0820 Last data filed at 09/11/2017 0801 Gross per 24 hour  Intake -  Output 3525 ml  Net -3525 ml   Filed Weights   09/10/17 1248 09/11/17 0648  Weight: (!) 431 lb 9.6 oz (195.8 kg) (!) 427 lb 4.8 oz (193.8 kg)    Telemetry    NSR - Personally Reviewed  Physical Exam   GEN: No acute distress, morbidly obese AAM HEENT: Normocephalic, atraumatic, sclera non-icteric. Neck: No JVD or bruits. Cardiac: RRR no murmurs, rubs, or gallops.  Radials/DP/PT 1+ and equal bilaterally.  Respiratory: Clear to auscultation bilaterally albeit diffusely diminished. Breathing is unlabored. GI: Soft, nontender, non-distended, BS +x 4. MS: no deformity. Extremities: No clubbing or cyanosis. Large baseline leg habitus, minimal pitting so difficult to assess if edema or habitus.  Neuro:  AAOx3. Follows commands. Psych:   Responds to questions appropriately with a normal affect.  Labs    Chemistry Recent Labs  Lab 09/09/17 1512 09/10/17 0451 09/11/17 0207  NA 138 139 139  K 3.9 4.5 3.7  CL 101 101 99*  CO2 26 25 27   GLUCOSE 104* 188* 142*  BUN 8 9 11   CREATININE 1.24 1.18 1.39*  CALCIUM 9.2 9.2 8.8*  GFRNONAA >60 >60 >60  GFRAA >60 >60 >60  ANIONGAP 11 13 13      Hematology Recent Labs  Lab 09/09/17 1512 09/10/17 0451  WBC 9.6 11.0*  RBC 5.87* 5.93*  HGB 14.4 14.4  HCT 41.0 41.8  MCV 69.8* 70.5*  MCH 24.5* 24.3*  MCHC 35.1 34.4  RDW 14.6 15.2  PLT 228 236    Cardiac Enzymes Recent Labs  Lab 09/09/17 2207 09/10/17 1221 09/10/17 1259  TROPONINI 0.08* 0.06* 0.04*    Recent Labs  Lab 09/09/17 1543  TROPIPOC 0.10*     BNP Recent Labs  Lab 09/09/17 1547  BNP 137.2*     DDimer No results for input(s): DDIMER in the last 168 hours.   Radiology    Dg Chest 2 View  Result Date: 09/09/2017 CLINICAL DATA:  Short of breath, LEFT-sided chest pain EXAM: CHEST  2 VIEW COMPARISON:  11/19/2016,  CT 11/01/2015 FINDINGS: Normal cardiac silhouette. Subtle reticulonodular pattern in the lower lobes not changed from prior. Greater density on the RIGHT. Upper lungs clear. No pleural effusion. IMPRESSION: Chronic reticulonodular densities in the lower lobes, RIGHT greater than LEFT represent chronic infection or edema. Electronically Signed   By: Genevive Bi M.D.   On: 09/09/2017 15:51    Cardiac Studies   Pending echo this admission  Patient Profile     32 y.o. male with a hx of super morbid obesity, HFrEF (EF 40-45%), OSA, and difficult-to-control HTN, noncompliance who was admitted with accelerated HTN in setting of medication noncompliance. Adm 11/2016 with CHF, HTN, AKI with Cr up to 2, EF 40-45% with inferior wall HK, small pericardial effusion, minimal troponin bump - per Dr. Erin Hearing note, coronary angiography could be considered in the future as noninvasive testing  unlikely to be helpful given his habitus. Did not follow-up after first OV as instructed. Returned with SBP 245/150, HR 104, troponin elevated to 0.1.  Assessment & Plan    1. Accelerated HTN - Continue to adjust BP meds. Add spironolactone 25 mg daily today. Try to wean nitro - hydralazine has been increased to 50 mg TID.  2. Acute on chronic combined CHF, also h/o small pericardial effusion - he is diuresing well - weight down 4 lbs. Creatinine up slightly - awaiting echo today. May be able to decrease lasix tomorrow to once daily. Start aldactone 25 mg daily today.  3. Elevated troponin - CP somewhat atypical, low flat troponin, suspect due to CHF. On aspirin.  4. Super morbid obesity complicated by OSA and probable OHS with hypoxia this admission - recommend bariatric referral at discharge. 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 I'm surprised he's been allotted renewal given that he has sleep apnea but has no documentation of compliance with CPAP as he has not been using it. May need to be out of work until this is arranged. Additionally if EF is <40% this will disqualify him for candidacy for driving for now. Patient was made aware of this concern.  5. Severe hyperlipidemia - LDL 227. Lipitor titrated to 80mg . 24-hr urine protein is in process.  For questions or updates, please contact [facility] Please consult [email or web address] for contact info under Cardiology/STEMI.  Chrystie Nose, MD, [facility], FACP  [place]  [facility]  Medical Director of the [facility]  Card[facility] Diplomate of the American Board of Clinical Lipidology Attending Cardiologist  Direct Dial: [phone]  Fax: [phone]  Website:  [email or web address]  Chrystie Nose, MD 09/11/2017, 8:20 AM

## 2017-09-11 NOTE — Progress Notes (Signed)
  Echocardiogram 2D Echocardiogram has been performed.  Celene Skeen 09/11/2017, 4:53 PM

## 2017-09-11 NOTE — Care Management Note (Signed)
Case Management Note Donn Pierini RN, BSN Unit 4E-Case Manager [phone]  Patient Details  Name: Paul Lamb MRN: [id number] Date of Birth: 1986-06-17  Subjective/Objective:   Pt admitted with HTN emergency  Action/Plan: PTA pt lived at home with spouse- uninsured- PCP noted in epic- Paul Lamb- CM will f/u with pt prior to discharge for transition needs and to review medications.   Expected Discharge Date:                  Expected Discharge Plan:  Home/Self Care  In-House Referral:     Discharge planning Services  CM Consult, Medication Assistance  Post Acute Care Choice:    Choice offered to:     DME Arranged:    DME Agency:     HH Arranged:    HH Agency:     Status of Service:  In process, will continue to follow  If discussed at Long Length of Stay Meetings, dates discussed:    Discharge Disposition:   Additional Comments:  Darrold Span, RN 09/11/2017, 10:46 AM

## 2017-09-11 NOTE — Progress Notes (Addendum)
Nutrition Consult/Brief Note  RD consulted for nutrition education regarding CHF.  Pt unavailable at time of RD visit. Provided "Heart Failure Nutrition Therapy" handout from the Academy of [employer] Dietetics. Left on tray table.  Body mass index is 57.95 kg/m. Pt meets criteria for Obesity Class III based on current BMI.  Current diet order is 2 gm Sodium, patient is consuming approximately 100% of meals at this time.   Labs and medications reviewed. RD to attempt to revisit pt at later date.  Maureen Chatters, RD, LDN Pager #: [phone] After-Hours Pager #: [phone]

## 2017-09-11 NOTE — Progress Notes (Signed)
Provider on call notified about continue elevation of Patient BP even with Nitro drip.

## 2017-09-11 NOTE — Progress Notes (Signed)
PROGRESS NOTE    Paul Lamb  ZOX:[id number] DOB: 05/30/1986 DOA: 09/09/2017 PCP: Marcine Matar, MD   Brief Narrative: 32 y.o. male with medical history significant of morbidly obese, OSA, hypertension, chronic systolic congestive heart failure, who works as a [employer] presented with headache diffuse associated with not feeling well and chest discomfort for 3 days.  Patient reported that he was running out of his antihypertensive medication and did not take any medicine for 3 days except hydralazine.   In the ER patient was found to have elevated blood pressure systolic more than 250s associated with lower extremity edema, started on nitro drip and admitted for further evaluation for hypertensive emergency.  Assessment & Plan:   #Hypertensive emergency due to noncompliance with home oral medications.: -Blood pressure is still elevated.  Trying to wean down nitro drip.  Increase the dose of hydralazine to 50 mg 3 times a day and added Aldactone by cardiologist.  Continue Coreg 25 twice a day and amlodipine.  I lower the dose of Lasix to 40 mg IV twice a day.  Edema has improved.  Continue to monitor blood pressure.  Avoid sudden drop.  #Acute on chronic systolic heart failure:  -Follow-up echocardiogram.  Lasix reduced to today because of increase in serum creatinine level.  Volume status improving.  Continue aspirin and current cardiac medication.  Cardiology consult appreciated.  #Elevated troponin due to hypertensive emergency and CHF: Trend troponin.  Check echocardiogram.    #Type 2 diabetes in obese patient: Holding metformin.  Continue sliding scale.  Check A1c level.  #Hyperlipidemia: Elevated LDL.  On Lipitor, dose increased.  #Headache diffuse in the setting of nitro drip.  Continue supportive care.  Tylenol as needed.  #Morbid obesity/OSA: CPAP order at bedtime.  Patient was recommended to follow-up with PCP and have sleep study after discharge.  He verbalized  understanding.  DVT prophylaxis: SCD.  No anticoagulation because of significantly elevated blood pressure. Code Status: Full code Family Communication: Patient's wife at bedside. Disposition Plan: Stepdown    Consultants:   Cardiology  Procedures: None Antimicrobials:  Subjective: Seen and examined at bedside.  Mild headache.  Denies chest pain, shortness of breath, nausea or vomiting.  Patient's wife at bedside.  Objective: Vitals:   09/11/17 0600 09/11/17 0648 09/11/17 0803 09/11/17 0809  BP: (!) 167/113  (!) 170/108   Pulse: 72  88 89  Resp: 20  18   Temp:   97.8 F (36.6 C)   TempSrc:   Oral   SpO2: 94%  92%   Weight:  (!) 193.8 kg (427 lb 4.8 oz)    Height:        Intake/Output Summary (Last 24 hours) at 09/11/2017 1309 Last data filed at 09/11/2017 1300 Gross per 24 hour  Intake 480 ml  Output 1575 ml  Net -1095 ml   Filed Weights   09/10/17 1248 09/11/17 0648  Weight: (!) 195.8 kg (431 lb 9.6 oz) (!) 193.8 kg (427 lb 4.8 oz)    Examination:  General exam: Not in distress Respiratory system: Bibasilar decreased breath sound, respiratory for normal . Cardiovascular system: Regular rate rhythm S1-S2 normal.  No edema Gastrointestinal system: Abdomen is nondistended, soft and nontender. Normal bowel sounds heard. Central nervous system: Alert and oriented. No focal neurological deficits. Extremities: Symmetric 5 x 5 power. Skin: No rashes, lesions or ulcers Psychiatry: Judgement and insight appear normal. Mood & affect appropriate.     Data Reviewed: I have personally reviewed following  labs and imaging studies  CBC: Recent Labs  Lab 09/09/17 1512 09/10/17 0451  WBC 9.6 11.0*  HGB 14.4 14.4  HCT 41.0 41.8  MCV 69.8* 70.5*  PLT 228 236   Basic Metabolic Panel: Recent Labs  Lab 09/09/17 1512 09/10/17 0451 09/11/17 0207  NA 138 139 139  K 3.9 4.5 3.7  CL 101 101 99*  CO2 26 25 27   GLUCOSE 104* 188* 142*  BUN 8 9 11   CREATININE 1.24  1.18 1.39*  CALCIUM 9.2 9.2 8.8*   GFR: Estimated Creatinine Clearance: 136.4 mL/min (A) (by C-G formula based on SCr of 1.39 mg/dL (H)). Liver Function Tests: No results for input(s): AST, ALT, ALKPHOS, BILITOT, PROT, ALBUMIN in the last 168 hours. No results for input(s): LIPASE, AMYLASE in the last 168 hours. No results for input(s): AMMONIA in the last 168 hours. Coagulation Profile: No results for input(s): INR, PROTIME in the last 168 hours. Cardiac Enzymes: Recent Labs  Lab 09/09/17 2207 09/10/17 1221 09/10/17 1259  TROPONINI 0.08* 0.06* 0.04*   BNP (last 3 results) No results for input(s): PROBNP in the last 8760 hours. HbA1C: No results for input(s): HGBA1C in the last 72 hours. CBG: No results for input(s): GLUCAP in the last 168 hours. Lipid Profile: Recent Labs    09/10/17 0451  CHOL 295*  HDL 39*  LDLCALC 227*  TRIG 145  CHOLHDL 7.6   Thyroid Function Tests: Recent Labs    09/09/17 2207  TSH 1.643   Anemia Panel: No results for input(s): VITAMINB12, FOLATE, FERRITIN, TIBC, IRON, RETICCTPCT in the last 72 hours. Sepsis Labs: No results for input(s): PROCALCITON, LATICACIDVEN in the last 168 hours.  No results found for this or any previous visit (from the past 240 hour(s)).       Radiology Studies: Dg Chest 2 View  Result Date: 09/09/2017 CLINICAL DATA:  Short of breath, LEFT-sided chest pain EXAM: CHEST  2 VIEW COMPARISON:  11/19/2016, CT 11/01/2015 FINDINGS: Normal cardiac silhouette. Subtle reticulonodular pattern in the lower lobes not changed from prior. Greater density on the RIGHT. Upper lungs clear. No pleural effusion. IMPRESSION: Chronic reticulonodular densities in the lower lobes, RIGHT greater than LEFT represent chronic infection or edema. Electronically Signed   By: Genevive Bi M.D.   On: 09/09/2017 15:51        Scheduled Meds: . amLODipine  10 mg Oral Daily  . aspirin EC  81 mg Oral Daily  . atorvastatin  80 mg Oral  q1800  . carvedilol  25 mg Oral BID WC  . furosemide  40 mg Intravenous Q12H  . hydrALAZINE  50 mg Oral Q8H  . Living Better with Heart Failure Book   Does not apply Once  . sodium chloride flush  3 mL Intravenous Q12H  . spironolactone  25 mg Oral Daily   Continuous Infusions: . sodium chloride    . nitroGLYCERIN 25 mcg/min (09/11/17 0946)     LOS: 2 days    Dron Jaynie Collins, MD [facility] Pager [phone]  If 7PM-7AM, please contact night-coverage [email or web address] Password [email or web address] 09/11/2017, 1:09 PM

## 2017-09-12 ENCOUNTER — Inpatient Hospital Stay (HOSPITAL_COMMUNITY): Payer: Medicaid Other

## 2017-09-12 DIAGNOSIS — R809 Proteinuria, unspecified: Secondary | ICD-10-CM

## 2017-09-12 DIAGNOSIS — I50813 Acute on chronic right heart failure: Secondary | ICD-10-CM

## 2017-09-12 LAB — BASIC METABOLIC PANEL
ANION GAP: 10 (ref 5–15)
BUN: 13 mg/dL (ref 6–20)
CHLORIDE: 100 mmol/L — AB (ref 101–111)
CO2: 28 mmol/L (ref 22–32)
Calcium: 9.1 mg/dL (ref 8.9–10.3)
Creatinine, Ser: 1.23 mg/dL (ref 0.61–1.24)
Glucose, Bld: 150 mg/dL — ABNORMAL HIGH (ref 65–99)
POTASSIUM: 3.5 mmol/L (ref 3.5–5.1)
SODIUM: 138 mmol/L (ref 135–145)

## 2017-09-12 LAB — MAGNESIUM: Magnesium: 1.8 mg/dL (ref 1.7–2.4)

## 2017-09-12 MED ORDER — ENOXAPARIN SODIUM 60 MG/0.6ML ~~LOC~~ SOLN
60.0000 mg | SUBCUTANEOUS | Status: DC
  Administered 2017-09-12: 60 mg via SUBCUTANEOUS
  Filled 2017-09-12 (×3): qty 0.6

## 2017-09-12 MED ORDER — HYDRALAZINE HCL 50 MG PO TABS
75.0000 mg | ORAL_TABLET | Freq: Three times a day (TID) | ORAL | Status: DC
  Administered 2017-09-12 – 2017-09-14 (×6): 75 mg via ORAL
  Filled 2017-09-12 (×6): qty 1

## 2017-09-12 MED ORDER — HYDRALAZINE HCL 20 MG/ML IJ SOLN
10.0000 mg | Freq: Three times a day (TID) | INTRAMUSCULAR | Status: DC | PRN
  Administered 2017-09-12: 10 mg via INTRAVENOUS
  Filled 2017-09-12: qty 1

## 2017-09-12 MED ORDER — POTASSIUM CHLORIDE CRYS ER 20 MEQ PO TBCR
40.0000 meq | EXTENDED_RELEASE_TABLET | Freq: Once | ORAL | Status: AC
  Administered 2017-09-12: 40 meq via ORAL
  Filled 2017-09-12: qty 2

## 2017-09-12 NOTE — Progress Notes (Signed)
PROGRESS NOTE    Paul Lamb  HAL:[id number] DOB: 05-Feb-1986 DOA: 09/09/2017 PCP: Marcine Matar, MD   Brief Narrative: 32 y.o. male with medical history significant of morbidly obese, OSA, hypertension, chronic systolic congestive heart failure, who works as a [employer] presented with headache diffuse associated with not feeling well and chest discomfort for 3 days.  Patient reported that he was running out of his antihypertensive medication and did not take any medicine for 3 days except hydralazine.   In the ER patient was found to have elevated blood pressure systolic more than 250s associated with lower extremity edema, started on nitro drip and admitted for further evaluation for hypertensive emergency.  Assessment & Plan:   #Hypertensive emergency due to noncompliance with home oral medications.: -Blood pressure is better controlled.  Continue hydralazine, Aldactone, Coreg.  Discontinue nitro drip today.  Edema has improved therefore discontinue Lasix. -Continue to monitor blood pressure today.  #Acute on chronic congestive heart failure with preserved EF: Echocardiogram with EF of 50-55% with normal wall motion.  Volume status improved.  Off diuretics.  Continue current cardiac medication.  Patient does not have chest pain.  #Elevated troponin due to hypertensive emergency and CHF:  #Type 2 diabetes in obese patient: Holding metformin.  Continue sliding scale.    A1c 6.9.  #Hyperlipidemia: Elevated LDL.  On Lipitor, dose increased.  #Headache diffuse in the setting of nitro drip.  Continue supportive care.  Tylenol as needed.  #Morbid obesity/OSA: CPAP order at bedtime.  Patient was recommended to follow-up with PCP and have sleep study after discharge.  He verbalized understanding.  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  I will check ultrasound renal, hepatitis panel, complements.  HIV negative.  Patient  will need outpatient follow-up with nephrology.  DVT prophylaxis: Lovenox Code Status: Full code Family Communication: No family at bedside. Disposition Plan: Stepdown    Consultants:   Cardiology  Procedures: None Antimicrobials:  Subjective: Seen and examined at bedside.  Feels better.  Denies chest pain, shortness of breath, nausea or vomiting.  Still on nitro drip.  Objective: Vitals:   09/12/17 0359 09/12/17 0407 09/12/17 0700 09/12/17 0844  BP: (!) 164/101 (!) 170/92 (!) 149/80   Pulse: 68 72 98 76  Resp: 20 (!) 21 19   Temp:  98.7 F (37.1 C) 98.4 F (36.9 C)   TempSrc:  Oral Oral   SpO2: 94% 95% 97%   Weight:  (!) 193.8 kg (427 lb 3.2 oz)    Height:        Intake/Output Summary (Last 24 hours) at 09/12/2017 1311 Last data filed at 09/12/2017 0849 Gross per 24 hour  Intake 330 ml  Output 1625 ml  Net -1295 ml   Filed Weights   09/10/17 1248 09/11/17 0648 09/12/17 0407  Weight: (!) 195.8 kg (431 lb 9.6 oz) (!) 193.8 kg (427 lb 4.8 oz) (!) 193.8 kg (427 lb 3.2 oz)    Examination:  General exam: Not in distress, sitting in bed comfortable Respiratory system: Distant breath sound, respiratory for normal, no wheezing. Cardiovascular system: Regular rate rhythm S1-S2 normal.  No pitting edema. Gastrointestinal system: Abdomen is nondistended, soft and nontender. Normal bowel sounds heard. Central nervous system: Alert and oriented. No focal neurological deficits. Extremities: Symmetric 5 x 5 power. Skin: No rashes, lesions or ulcers Psychiatry: Judgement and insight appear normal. Mood & affect appropriate.     Data Reviewed: I have personally reviewed following labs and  imaging studies  CBC: Recent Labs  Lab 09/09/17 1512 09/10/17 0451  WBC 9.6 11.0*  HGB 14.4 14.4  HCT 41.0 41.8  MCV 69.8* 70.5*  PLT 228 236   Basic Metabolic Panel: Recent Labs  Lab 09/09/17 1512 09/10/17 0451 09/11/17 0207 09/12/17 0244  NA 138 139 139 138  K 3.9 4.5  3.7 3.5  CL 101 101 99* 100*  CO2 26 25 27 28   GLUCOSE 104* 188* 142* 150*  BUN 8 9 11 13   CREATININE 1.24 1.18 1.39* 1.23  CALCIUM 9.2 9.2 8.8* 9.1  MG  --   --   --  1.8   GFR: Estimated Creatinine Clearance: 154.1 mL/min (by C-G formula based on SCr of 1.23 mg/dL). Liver Function Tests: No results for input(s): AST, ALT, ALKPHOS, BILITOT, PROT, ALBUMIN in the last 168 hours. No results for input(s): LIPASE, AMYLASE in the last 168 hours. No results for input(s): AMMONIA in the last 168 hours. Coagulation Profile: No results for input(s): INR, PROTIME in the last 168 hours. Cardiac Enzymes: Recent Labs  Lab 09/09/17 2207 09/10/17 1221 09/10/17 1259  TROPONINI 0.08* 0.06* 0.04*   BNP (last 3 results) No results for input(s): PROBNP in the last 8760 hours. HbA1C: Recent Labs    09/11/17 1851  HGBA1C 6.9*   CBG: No results for input(s): GLUCAP in the last 168 hours. Lipid Profile: Recent Labs    09/10/17 0451  CHOL 295*  HDL 39*  LDLCALC 227*  TRIG 145  CHOLHDL 7.6   Thyroid Function Tests: Recent Labs    09/09/17 2207  TSH 1.643   Anemia Panel: No results for input(s): VITAMINB12, FOLATE, FERRITIN, TIBC, IRON, RETICCTPCT in the last 72 hours. Sepsis Labs: No results for input(s): PROCALCITON, LATICACIDVEN in the last 168 hours.  No results found for this or any previous visit (from the past 240 hour(s)).       Radiology Studies: No results found.      Scheduled Meds: . amLODipine  10 mg Oral Daily  . aspirin EC  81 mg Oral Daily  . atorvastatin  80 mg Oral q1800  . carvedilol  25 mg Oral BID WC  . hydrALAZINE  50 mg Oral Q8H  . Living Better with Heart Failure Book   Does not apply Once  . sodium chloride flush  3 mL Intravenous Q12H  . spironolactone  25 mg Oral Daily   Continuous Infusions: . sodium chloride       LOS: 3 days    Dron Jaynie Collins, MD [facility] Pager [phone]  If 7PM-7AM, please contact  night-coverage [email or web address] Password [email or web address] 09/12/2017, 1:11 PM

## 2017-09-12 NOTE — Progress Notes (Addendum)
Progress Note  Patient Name: Paul Lamb Date of Encounter: 09/12/2017  Primary Cardiologist: Dr. Allyson Sabal in 2017  Subjective   Headache has improved. BP has improved, but remains somewhat elevated. Aldactone started yesterday. On amlodipine, hydralazine and carvedilol. Echo performed yesterday which shows LVEF 50-55%, moderate LVH and mild LAE. Has diuresed about 3.5L negative. Creatinine is normal, but borderline hypokalemic today.   Inpatient Medications    Scheduled Meds: . amLODipine  10 mg Oral Daily  . aspirin EC  81 mg Oral Daily  . atorvastatin  80 mg Oral q1800  . carvedilol  25 mg Oral BID WC  . furosemide  40 mg Intravenous Q12H  . hydrALAZINE  50 mg Oral Q8H  . Living Better with Heart Failure Book   Does not apply Once  . sodium chloride flush  3 mL Intravenous Q12H  . spironolactone  25 mg Oral Daily   Continuous Infusions: . sodium chloride    . nitroGLYCERIN 25 mcg/min (09/12/17 0636)   PRN Meds: sodium chloride, acetaminophen, ondansetron (ZOFRAN) IV, sodium chloride flush   Vital Signs    Vitals:   09/12/17 0359 09/12/17 0407 09/12/17 0700 09/12/17 0844  BP: (!) 164/101 (!) 170/92 (!) 149/80   Pulse: 68 72 98 76  Resp: 20 (!) 21 19   Temp:  98.7 F (37.1 C) 98.4 F (36.9 C)   TempSrc:  Oral Oral   SpO2: 94% 95% 97%   Weight:  (!) 427 lb 3.2 oz (193.8 kg)    Height:        Intake/Output Summary (Last 24 hours) at 09/12/2017 0942 Last data filed at 09/12/2017 0849 Gross per 24 hour  Intake 570 ml  Output 1625 ml  Net -1055 ml   Filed Weights   09/10/17 1248 09/11/17 0648 09/12/17 0407  Weight: (!) 431 lb 9.6 oz (195.8 kg) (!) 427 lb 4.8 oz (193.8 kg) (!) 427 lb 3.2 oz (193.8 kg)    Telemetry    NSR - Personally Reviewed  Physical Exam   General appearance: alert and no distress Neck: no carotid bruit, no JVD and thyroid not enlarged, symmetric, no tenderness/mass/nodules Lungs: clear to auscultation bilaterally Heart: regular  rate and rhythm, S1, S2 normal, no murmur, click, rub or gallop Abdomen: soft, non-tender; bowel sounds normal; no masses,  no organomegaly and morbidly obese Extremities: extremities normal, atraumatic, no cyanosis or edema Pulses: 2+ and symmetric Skin: Skin color, texture, turgor normal. No rashes or lesions Neurologic: Grossly normal Psych: Pleasant   Labs    Chemistry Recent Labs  Lab 09/10/17 0451 09/11/17 0207 09/12/17 0244  NA 139 139 138  K 4.5 3.7 3.5  CL 101 99* 100*  CO2 25 27 28   GLUCOSE 188* 142* 150*  BUN 9 11 13   CREATININE 1.18 1.39* 1.23  CALCIUM 9.2 8.8* 9.1  GFRNONAA >60 >60 >60  GFRAA >60 >60 >60  ANIONGAP 13 13 10      Hematology Recent Labs  Lab 09/09/17 1512 09/10/17 0451  WBC 9.6 11.0*  RBC 5.87* 5.93*  HGB 14.4 14.4  HCT 41.0 41.8  MCV 69.8* 70.5*  MCH 24.5* 24.3*  MCHC 35.1 34.4  RDW 14.6 15.2  PLT 228 236    Cardiac Enzymes Recent Labs  Lab 09/09/17 2207 09/10/17 1221 09/10/17 1259  TROPONINI 0.08* 0.06* 0.04*    Recent Labs  Lab 09/09/17 1543  TROPIPOC 0.10*     BNP Recent Labs  Lab 09/09/17 1547  BNP 137.2*  DDimer No results for input(s): DDIMER in the last 168 hours.   Radiology    No results found.  Cardiac Studies   Pending echo this admission  Patient Profile     32 y.o. male with a hx of super morbid obesity, HFrEF (EF 40-45%), OSA, and difficult-to-control HTN, noncompliance who was admitted with accelerated HTN in setting of medication noncompliance. Adm 11/2016 with CHF, HTN, AKI with Cr up to 2, EF 40-45% with inferior wall HK, small pericardial effusion, minimal troponin bump - per Dr. Erin Hearing note, coronary angiography could be considered in the future as noninvasive testing unlikely to be helpful given his habitus. Did not follow-up after first OV as instructed. Returned with SBP 245/150, HR 104, troponin elevated to 0.1.  Assessment & Plan    1. Accelerated HTN - Continue to adjust BP  meds. BP improved on current regimen. D/C nitro gtts.  2. Acute on chronic combined CHF, also h/o small pericardial effusion - he is diuresing well - weight down 4 lbs and unchanged. Creatinine up slightly - echo with LVEF 50-55% without pericardial effusion. Will d/c lasix today.  Aldactone 25 mg daily started yesterday.  3. Elevated troponin - CP somewhat atypical, low flat troponin, suspect due to CHF or hypertensive emergency. On aspirin. Echo shows no wall motion abnormalities and low normal LVEF of 50-55%.   4. Super morbid obesity complicated by OSA and probable OHS with hypoxia this admission - recommend bariatric referral at discharge. 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 I'm surprised he's been allotted renewal given that he has sleep apnea but has no documentation of compliance with CPAP as he has not been using it. May need to be out of work until this is arranged. Additionally if EF is <40% this will disqualify him for candidacy for driving for now. Patient was made aware of this concern.  5. Severe hyperlipidemia - LDL 227. Lipitor titrated to .  6. Proteinuria - near nephrotic range proteinuria at 3,384 mg/24 hrs- urine creatinine not ordered. Consider nephrology consultation.  7. DOT questions - continues to ask if he will be able to go back to driving. Doubt that he will be able to until BP controlled. Will need formal re-evaluation by DOT physician after d/c.  For questions or updates, please contact [facility] Please consult [email or web address] for contact info under Cardiology/STEMI.  Chrystie Nose, MD, [facility], FACP  [place]  [facility]  Medical Director of the Advanced Lipid Disorders &  Cardiovascular Risk Reduction Clinic Diplomate of the American Board of Clinical Lipidology Attending Cardiologist  Direct Dial: [phone]  Fax: [phone]  Website:  [email or web address]  Chrystie Nose, MD 09/12/2017, 9:42 AM

## 2017-09-13 LAB — HEPATITIS PANEL, ACUTE
HCV Ab: 0.1 s/co ratio (ref 0.0–0.9)
HEP A IGM: NEGATIVE
HEP B C IGM: NEGATIVE
HEP B S AG: NEGATIVE

## 2017-09-13 LAB — BASIC METABOLIC PANEL
Anion gap: 12 (ref 5–15)
BUN: 18 mg/dL (ref 6–20)
CALCIUM: 9.3 mg/dL (ref 8.9–10.3)
CHLORIDE: 101 mmol/L (ref 101–111)
CO2: 25 mmol/L (ref 22–32)
CREATININE: 1.22 mg/dL (ref 0.61–1.24)
GFR calc non Af Amer: >60 mL/min (ref >60)
GLUCOSE: 134 mg/dL — AB (ref 65–99)
Potassium: 3.9 mmol/L (ref 3.5–5.1)
Sodium: 138 mmol/L (ref 135–145)

## 2017-09-13 LAB — C3 COMPLEMENT: C3 COMPLEMENT: 247 mg/dL — AB (ref 82–167)

## 2017-09-13 LAB — C4 COMPLEMENT: Complement C4, Body Fluid: 53 mg/dL — ABNORMAL HIGH (ref 14–44)

## 2017-09-13 MED ORDER — ENOXAPARIN SODIUM 80 MG/0.8ML ~~LOC~~ SOLN
80.0000 mg | SUBCUTANEOUS | Status: DC
  Administered 2017-09-13: 14:00:00 80 mg via SUBCUTANEOUS
  Filled 2017-09-13: qty 0.8

## 2017-09-13 MED ORDER — IRBESARTAN 150 MG PO TABS
150.0000 mg | ORAL_TABLET | Freq: Every day | ORAL | Status: DC
  Administered 2017-09-13 – 2017-09-14 (×2): 150 mg via ORAL
  Filled 2017-09-13 (×2): qty 1

## 2017-09-13 NOTE — Progress Notes (Signed)
Progress Note  Patient Name: Paul Lamb Date of Encounter: 09/13/2017  Primary Cardiologist: Dr. Allyson Sabal in 2017  Subjective   Off nitro gtts. BP remains elevated. ? Weight today at 393 lbs. Creatinine has normalized - needs better bp control. Significant protein losses in the urine. Renal ultrasound was normal.  Inpatient Medications    Scheduled Meds: . amLODipine  10 mg Oral Daily  . aspirin EC  81 mg Oral Daily  . atorvastatin  80 mg Oral q1800  . carvedilol  25 mg Oral BID WC  . enoxaparin (LOVENOX) injection  60 mg Subcutaneous Q24H  . hydrALAZINE  75 mg Oral Q8H  . Living Better with Heart Failure Book   Does not apply Once  . sodium chloride flush  3 mL Intravenous Q12H  . spironolactone  25 mg Oral Daily   Continuous Infusions: . sodium chloride     PRN Meds: sodium chloride, acetaminophen, hydrALAZINE, ondansetron (ZOFRAN) IV, sodium chloride flush   Vital Signs    Vitals:   09/12/17 2345 09/13/17 0002 09/13/17 0347 09/13/17 0829  BP: 137/75  (!) 161/98 (!) 164/108  Pulse: 78  64 74  Resp: 19 18 17    Temp: 97.9 F (36.6 C)  98 F (36.7 C) 97.7 F (36.5 C)  TempSrc: Oral  Oral Oral  SpO2: 93% 94% 97% 96%  Weight:   (!) 393 lb 1.6 oz (178.3 kg)   Height:       No intake or output data in the 24 hours ending 09/13/17 1011 Filed Weights   09/11/17 0648 09/12/17 0407 09/13/17 0347  Weight: (!) 427 lb 4.8 oz (193.8 kg) (!) 427 lb 3.2 oz (193.8 kg) (!) 393 lb 1.6 oz (178.3 kg)    Telemetry    NSR - Personally Reviewed  Physical Exam   General appearance: alert and no distress Neck: no carotid bruit, no JVD and thyroid not enlarged, symmetric, no tenderness/mass/nodules Lungs: clear to auscultation bilaterally Heart: regular rate and rhythm, S1, S2 normal, no murmur, click, rub or gallop Abdomen: soft, non-tender; bowel sounds normal; no masses,  no organomegaly and morbidly obese Extremities: extremities normal, atraumatic, no cyanosis or  edema Pulses: 2+ and symmetric Skin: Skin color, texture, turgor normal. No rashes or lesions Neurologic: Grossly normal Psych: Pleasant   Labs    Chemistry Recent Labs  Lab 09/11/17 0207 09/12/17 0244 09/13/17 0721  NA 139 138 138  K 3.7 3.5 3.9  CL 99* 100* 101  CO2 27 28 25   GLUCOSE 142* 150* 134*  BUN 11 13 18   CREATININE 1.39* 1.23 1.22  CALCIUM 8.8* 9.1 9.3  GFRNONAA >60 >60 >60  GFRAA >60 >60 >60  ANIONGAP 13 10 12      Hematology Recent Labs  Lab 09/09/17 1512 09/10/17 0451  WBC 9.6 11.0*  RBC 5.87* 5.93*  HGB 14.4 14.4  HCT 41.0 41.8  MCV 69.8* 70.5*  MCH 24.5* 24.3*  MCHC 35.1 34.4  RDW 14.6 15.2  PLT 228 236    Cardiac Enzymes Recent Labs  Lab 09/09/17 2207 09/10/17 1221 09/10/17 1259  TROPONINI 0.08* 0.06* 0.04*    Recent Labs  Lab 09/09/17 1543  TROPIPOC 0.10*     BNP Recent Labs  Lab 09/09/17 1547  BNP 137.2*     DDimer No results for input(s): DDIMER in the last 168 hours.   Radiology    US Renal  Result Date: 09/12/2017 CLINICAL DATA:  Proteinuria EXAM: RENAL / URINARY TRACT ULTRASOUND COMPLETE COMPARISON:  CT from 11/01/2015 FINDINGS: Right Kidney: Length: 12.1 cm. Echogenicity within normal limits. No mass or hydronephrosis visualized. Left Kidney: Length: 13.8 cm. Echogenicity within normal limits. No mass or hydronephrosis visualized. Bladder: Predominately decompressed IMPRESSION: Normal renal ultrasound. Electronically Signed   By: Alcide Clever M.D.   On: 09/12/2017 14:46    Cardiac Studies   Pending echo this admission  Patient Profile     32 y.o. male with a hx of super morbid obesity, HFrEF (EF 40-45%), OSA, and difficult-to-control HTN, noncompliance who was admitted with accelerated HTN in setting of medication noncompliance. Adm 11/2016 with CHF, HTN, AKI with Cr up to 2, EF 40-45% with inferior wall HK, small pericardial effusion, minimal troponin bump - per Dr. Erin Hearing note, coronary angiography could be  considered in the future as noninvasive testing unlikely to be helpful given his habitus. Did not follow-up after first OV as instructed. Returned with SBP 245/150, HR 104, troponin elevated to 0.1.  Assessment & Plan    1. Accelerated HTN - Continue to adjust BP meds. BP improved on current regimen. Given his proteinuria and now normal renal function - add irbesartan 150 mg daily for additional BP control.  2. Acute on chronic combined CHF, also h/o small pericardial effusion - he is diuresing well - weight down 4 lbs and unchanged. Creatinine up slightly - echo with LVEF 50-55% without pericardial effusion.   3. Elevated troponin - CP somewhat atypical, low flat troponin, suspect due to CHF or hypertensive emergency. On aspirin. Echo shows no wall motion abnormalities and low normal LVEF of 50-55%.   4. Super morbid obesity complicated by OSA and probable OHS with hypoxia this admission - recommend bariatric referral at discharge. 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 I'm surprised he's been allotted renewal given that he has sleep apnea but has no documentation of compliance with CPAP as he has not been using it. May need to be out of work until this is arranged. Additionally if EF is <40% this will disqualify him for candidacy for driving for now. Patient was made aware of this concern.  5. Severe hyperlipidemia - LDL 227. Lipitor titrated to .  6. Proteinuria - near nephrotic range proteinuria at 3,384 mg/24 hrs- urine creatinine not ordered. Normal renal ultrasound.  For questions or updates, please contact [facility] Please consult [email or web address] for contact info under Cardiology/STEMI.  Chrystie Nose, MD, [facility], FACP  [place]  [facility]  Medical Director of the Advanced Lipid Disorders &  Cardiovascular Risk Reduction Clinic Diplomate of the American Board of Clinical Lipidology Attending Cardiologist   Direct Dial: [phone]  Fax: [phone]  Website:  [email or web address]  Chrystie Nose, MD 09/13/2017, 10:11 AM

## 2017-09-13 NOTE — Progress Notes (Signed)
PROGRESS NOTE    Paul Lamb  KMQ:[id number] DOB: 10/25/1985 DOA: 09/09/2017 PCP: Marcine Matar, MD   Brief Narrative: 32 y.o. male with medical history significant of morbidly obese, OSA, hypertension, chronic systolic congestive heart failure, who works as a [employer] presented with headache diffuse associated with not feeling well and chest discomfort for 3 days.  Patient reported that he was running out of his antihypertensive medication and did not take any medicine for 3 days except hydralazine.   In the ER patient was found to have elevated blood pressure systolic more than 250s associated with lower extremity edema, started on nitro drip and admitted for further evaluation for hypertensive emergency.  Assessment & Plan:   #Hypertensive emergency due to noncompliance with home oral medications.: -Blood pressure is a still elevation with fluctuation.  Of nitroglycerin drip.  Adding irbesartan by cardiology.  Continue hydralazine, Aldactone, Coreg.  Edema has improved therefore discontinued Lasix.  I believe patient will benefit from monitoring blood pressure on multiple regimen today.  Cardiology consult appreciated.  Monitor renal function.  #Acute on chronic congestive heart failure with preserved EF: Echocardiogram with EF of 50-55% with normal wall motion.  Volume status improved.  Off diuretics.  Continue current cardiac medication.  Patient does not have chest pain.  #Elevated troponin due to hypertensive emergency and CHF:  #Type 2 diabetes in obese patient: Holding metformin.  Continue sliding scale.    A1c 6.9.  #Hyperlipidemia: Elevated LDL.  On Lipitor, dose increased.  #Headache diffuse in the setting of nitro drip.  Continue supportive care.  Tylenol as needed.  #Morbid obesity/OSA: CPAP order at bedtime.  Patient was recommended to follow-up with PCP and have sleep study after discharge.  He verbalized understanding.  #Proteinuria likely due to  diabetic/hypertensive nephropathy: Patient is also morbidly obese.  UA has no RBCs.  Renal ultrasound unremarkable.  Hepatitis, HIV, complements unremarkable.  I recommended patient to monitor blood pressure, blood sugar level and follow-up with PCP.  If continued to have proteinuria significantly may need nephrology evaluation further as outpatient.  I discussed this with the patient.    DVT prophylaxis: Lovenox Code Status: Full code Family Communication: No family at bedside. Disposition Plan: Stepdown    Consultants:   Cardiology  Procedures: None Antimicrobials:  Subjective: Seen and examined at bedside.  Denies headache, dizziness, nausea vomiting chest pain.  Objective: Vitals:   09/13/17 0002 09/13/17 0347 09/13/17 0829 09/13/17 1028  BP:  (!) 161/98 (!) 164/108 (!) 142/94  Pulse:  64 74   Resp: 18 17  17   Temp:  98 F (36.7 C) 97.7 F (36.5 C)   TempSrc:  Oral Oral   SpO2: 94% 97% 96%   Weight:  (!) 178.3 kg (393 lb 1.6 oz)    Height:       No intake or output data in the 24 hours ending 09/13/17 1108 Filed Weights   09/11/17 0648 09/12/17 0407 09/13/17 0347  Weight: (!) 193.8 kg (427 lb 4.8 oz) (!) 193.8 kg (427 lb 3.2 oz) (!) 178.3 kg (393 lb 1.6 oz)    Examination:  General exam: Not in distress Respiratory system: Distant breath sound, no wheezing. Cardiovascular system: Regular rate rhythm S1.  No pitting edema. Gastrointestinal system: Abdomen is soft nontender.  Bowel sounds positive Central nervous system: Alert and oriented. No focal neurological deficits. Skin: No rashes, lesions or ulcers Psychiatry: Judgement and insight appear normal. Mood & affect appropriate.     Data Reviewed:  I have personally reviewed following labs and imaging studies  CBC: Recent Labs  Lab 09/09/17 1512 09/10/17 0451  WBC 9.6 11.0*  HGB 14.4 14.4  HCT 41.0 41.8  MCV 69.8* 70.5*  PLT 228 236   Basic Metabolic Panel: Recent Labs  Lab 09/09/17 1512  09/10/17 0451 09/11/17 0207 09/12/17 0244 09/13/17 0721  NA 138 139 139 138 138  K 3.9 4.5 3.7 3.5 3.9  CL 101 101 99* 100* 101  CO2 26 25 27 28 25   GLUCOSE 104* 188* 142* 150* 134*  BUN 8 9 11 13 18   CREATININE 1.24 1.18 1.39* 1.23 1.22  CALCIUM 9.2 9.2 8.8* 9.1 9.3  MG  --   --   --  1.8  --    GFR: Estimated Creatinine Clearance: 147.6 mL/min (by C-G formula based on SCr of 1.22 mg/dL). Liver Function Tests: No results for input(s): AST, ALT, ALKPHOS, BILITOT, PROT, ALBUMIN in the last 168 hours. No results for input(s): LIPASE, AMYLASE in the last 168 hours. No results for input(s): AMMONIA in the last 168 hours. Coagulation Profile: No results for input(s): INR, PROTIME in the last 168 hours. Cardiac Enzymes: Recent Labs  Lab 09/09/17 2207 09/10/17 1221 09/10/17 1259  TROPONINI 0.08* 0.06* 0.04*   BNP (last 3 results) No results for input(s): PROBNP in the last 8760 hours. HbA1C: Recent Labs    09/11/17 1851  HGBA1C 6.9*   CBG: No results for input(s): GLUCAP in the last 168 hours. Lipid Profile: No results for input(s): CHOL, HDL, LDLCALC, TRIG, CHOLHDL, LDLDIRECT in the last 72 hours. Thyroid Function Tests: No results for input(s): TSH, T4TOTAL, FREET4, T3FREE, THYROIDAB in the last 72 hours. Anemia Panel: No results for input(s): VITAMINB12, FOLATE, FERRITIN, TIBC, IRON, RETICCTPCT in the last 72 hours. Sepsis Labs: No results for input(s): PROCALCITON, LATICACIDVEN in the last 168 hours.  No results found for this or any previous visit (from the past 240 hour(s)).       Radiology Studies: US Renal  Result Date: 09/12/2017 CLINICAL DATA:  Proteinuria EXAM: RENAL / URINARY TRACT ULTRASOUND COMPLETE COMPARISON:  CT from 11/01/2015 FINDINGS: Right Kidney: Length: 12.1 cm. Echogenicity within normal limits. No mass or hydronephrosis visualized. Left Kidney: Length: 13.8 cm. Echogenicity within normal limits. No mass or hydronephrosis visualized.  Bladder: Predominately decompressed IMPRESSION: Normal renal ultrasound. Electronically Signed   By: Alcide Clever M.D.   On: 09/12/2017 14:46        Scheduled Meds: . amLODipine  10 mg Oral Daily  . aspirin EC  81 mg Oral Daily  . atorvastatin  80 mg Oral q1800  . carvedilol  25 mg Oral BID WC  . enoxaparin (LOVENOX) injection  60 mg Subcutaneous Q24H  . hydrALAZINE  75 mg Oral Q8H  . irbesartan  150 mg Oral Daily  . Living Better with Heart Failure Book   Does not apply Once  . sodium chloride flush  3 mL Intravenous Q12H  . spironolactone  25 mg Oral Daily   Continuous Infusions: . sodium chloride       LOS: 4 days    Vallory Oetken Jaynie Collins, MD [facility] Pager [phone]  If 7PM-7AM, please contact night-coverage [email or web address] Password [facility] 09/13/2017, 11:08 AM

## 2017-09-13 NOTE — Care Management Note (Signed)
Case Management Note  Patient Details  Name: Paul Lamb MRN: [id number] Date of Birth: [birth year]  Subjective/Objective:     Accelerated HTN, A/C CHF, Elevated tron, morbid obesity, OSA, Financial Counselor has completed interview              Action/Plan: Discharge Planning: Spoke to pt and he has an appt with [facility] on 09/22/2017. He missed his appt that was scheduled in 06/2017. States he has Rx at pharmacy but was not able to pick up at [facility]. Explained he can utilize a Tour manager that is open later and on weekends. [facility] is closed on weekend and holidays. States he previously was compliant with taking meds but missed doses, because he was not able to pick up. 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 Referral to HF RN, Daphne for additional education on CHF. Will continue to follow for dc needs.  DME-CPAP at home   Expected Discharge Date:                  Expected Discharge Plan:  Home/Self Care  In-House Referral:  Financial Counselor  Discharge planning Services  CM Consult, Medication Assistance, Follow-up appt scheduled  Post Acute Care Choice:  NA Choice offered to:  NA  DME Arranged:  N/A DME Agency:  NA  HH Arranged:  NA HH Agency:  NA  Status of Service:  Completed, signed off  If discussed at Long Length of Stay Meetings, dates discussed:    Additional Comments:  Elliot Cousin, RN 09/13/2017, 11:42 AM

## 2017-09-14 ENCOUNTER — Encounter (HOSPITAL_COMMUNITY): Payer: Self-pay | Admitting: Nephrology

## 2017-09-14 DIAGNOSIS — E119 Type 2 diabetes mellitus without complications: Secondary | ICD-10-CM

## 2017-09-14 DIAGNOSIS — R0789 Other chest pain: Secondary | ICD-10-CM

## 2017-09-14 LAB — BASIC METABOLIC PANEL
ANION GAP: 12 (ref 5–15)
BUN: 21 mg/dL — ABNORMAL HIGH (ref 6–20)
CALCIUM: 9.1 mg/dL (ref 8.9–10.3)
CHLORIDE: 102 mmol/L (ref 101–111)
CO2: 24 mmol/L (ref 22–32)
CREATININE: 1.31 mg/dL — AB (ref 0.61–1.24)
GFR calc Af Amer: >60 mL/min (ref >60)
GFR calc non Af Amer: >60 mL/min (ref >60)
Glucose, Bld: 165 mg/dL — ABNORMAL HIGH (ref 65–99)
Potassium: 4.1 mmol/L (ref 3.5–5.1)
SODIUM: 138 mmol/L (ref 135–145)

## 2017-09-14 MED ORDER — ATORVASTATIN CALCIUM 80 MG PO TABS: 80.0000 mg | ORAL_TABLET | Freq: Every day | ORAL | 0 refills | Status: DC

## 2017-09-14 MED ORDER — METFORMIN HCL 500 MG PO TABS: 500.0000 mg | ORAL_TABLET | Freq: Two times a day (BID) | ORAL | 0 refills | Status: DC

## 2017-09-14 MED ORDER — CARVEDILOL 25 MG PO TABS: 25.0000 mg | ORAL_TABLET | Freq: Two times a day (BID) | ORAL | 0 refills | Status: DC

## 2017-09-14 MED ORDER — ASPIRIN 81 MG PO TBEC: 81.0000 mg | DELAYED_RELEASE_TABLET | Freq: Every day | ORAL | 0 refills | Status: DC

## 2017-09-14 MED ORDER — IRBESARTAN 150 MG PO TABS: 150.0000 mg | ORAL_TABLET | Freq: Every day | ORAL | 0 refills | Status: DC

## 2017-09-14 MED ORDER — SPIRONOLACTONE 25 MG PO TABS: 25.0000 mg | ORAL_TABLET | Freq: Every day | ORAL | 0 refills | Status: DC

## 2017-09-14 MED ORDER — AMLODIPINE BESYLATE 10 MG PO TABS: 10.0000 mg | ORAL_TABLET | Freq: Every day | ORAL | 0 refills | Status: DC

## 2017-09-14 MED ORDER — HYDRALAZINE HCL 25 MG PO TABS: 75.0000 mg | ORAL_TABLET | Freq: Three times a day (TID) | ORAL | 0 refills | Status: DC

## 2017-09-14 MED FILL — ?HYDRALAZINE 25MG TAB: 25 | 10 days supply | Qty: 90 | Fill #0

## 2017-09-14 MED FILL — ?AMLODIPINE BESYLATE 10MG T: 10 | 30 days supply | Qty: 30 | Fill #0

## 2017-09-14 MED FILL — ?IRBESARTAN 150 MG TAB: 150 | 30 days supply | Qty: 30 | Fill #0

## 2017-09-14 MED FILL — ?METFORMIN HCL 500MG TABLET: 500 | 30 days supply | Qty: 60 | Fill #0

## 2017-09-14 MED FILL — SPIRONOLACTONE 25 MG TABS: 25 | 30 days supply | Qty: 30 | Fill #0

## 2017-09-14 MED FILL — ATORVASTATIN 80 MG TABLET: 80 | 30 days supply | Qty: 30 | Fill #0

## 2017-09-14 MED FILL — ?CARVEDILOL 25 MG TABLET: 25 | 30 days supply | Qty: 60 | Fill #0

## 2017-09-14 NOTE — Progress Notes (Signed)
BP improved - continue current BP meds at discharge. Follow-up with Dr. Allyson Sabal as needed for BP management.  Chrystie Nose, MD, [facility], FACP  [place]  [facility]  Medical Director of the [facility] Lipid [facility] &  Cardiovascular Risk Reduction Clinic Diplomate of the American Board of Clinical Lipidology Attending Cardiologist  Direct Dial: [phone]  Fax: [phone]  Website:  [email or web address]

## 2017-09-14 NOTE — Discharge Summary (Signed)
Physician Discharge Summary  Paul Lamb QRF:[id number] DOB: 1986/01/14 DOA: 09/09/2017  PCP: Marcine Matar, MD  Admit date: 09/09/2017 Discharge date: 09/14/2017  Admitted From:home Disposition:home  Recommendations for Outpatient Follow-up:  1. Follow up with PCP in 1-2 weeks 2. Please obtain BMP/CBC in one week   Home Health:no Equipment/Devices:resume cpap on discharge Discharge Condition:stable CODE STATUS:full code Diet recommendation:low salt diet  Brief/Interim Summary: 31 y.o.malewith medical history significant ofmorbidly obese, OSA, hypertension, chronic systolic congestive heart failure, who works as a Naval architect presented with headache diffuse associated with not feeling well and chest discomfort for 3 days. Patient reported that he was running out of his antihypertensive medication and did not take any medicine for 3 days except hydralazine.   In the ER patient was found to have elevated blood pressure systolic more than 250s associated with lower extremity edema, started on nitro drip and admitted for further evaluation for hypertensive emergency.  #Hypertensive emergency due to noncompliance with home oral medications.: -Blood pressure medication changed and adjusted as below.  Now blood pressure is better controlled.  Patient is on multiple regimen.  The prescription sent to his pharmacy.  I recommended patient to take a low-salt diet and check his blood pressure at least twice a day.  Patient reported that he has follow-up appointment with his PCP on February 1.  I asked him to check his blood test as well.  I recommend him not to drive until he will be evaluated by PCP to make sure his blood pressure is better control and he remains stable.  #Acute on chronic congestive heart failure with preserved EF: Echocardiogram with EF of 50-55% with normal wall motion.  Volume status improved.  Patient does not have chest pain.  Continue current cardiac  medication.  #Elevated troponin due to hypertensive emergency and CHF:  #Type 2 diabetes in obese patient: Continue metformin.  Education provided to take low-carb diet.  A1c 6.9.  #Hyperlipidemia: Elevated LDL.    Increase the dose of Lipitor to 80 mg.  Recommended healthy diet.  #Headache diffuse in the setting of nitro drip.    Improved  #Morbid obesity/OSA: CPAP order at bedtime.  Patient reported he has CPAP at home.  Continue to resume it.  Recommend to follow-up with PCP and may need evaluation by pulmonologist.  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  UA has no RBCs.  Renal ultrasound unremarkable.  Hepatitis, HIV, complements unremarkable.  I recommended patient to monitor blood pressure, blood sugar level and follow-up with PCP.  If continued to have proteinuria significantly may need nephrology evaluation further as outpatient.  I discussed this with the patient.      Discharge Diagnoses:  Active Problems:   Hypertensive emergency   Morbid obesity, unspecified obesity type (HCC)   CHF (congestive heart failure) (HCC)   Elevated troponin   Proteinuria    Discharge Instructions  Discharge Instructions    Call MD for:  difficulty breathing, headache or visual disturbances   Complete by:  As directed    Call MD for:  extreme fatigue   Complete by:  As directed    Call MD for:  hives   Complete by:  As directed    Call MD for:  persistant dizziness or light-headedness   Complete by:  As directed    Call MD for:  persistant nausea and vomiting   Complete by:  As directed    Call MD for:  severe uncontrolled  pain   Complete by:  As directed    Call MD for:  temperature >100.4   Complete by:  As directed    Diet - low sodium heart healthy   Complete by:  As directed    Discharge instructions   Complete by:  As directed    Please monitor blood pressure at least twice a day.  Follow-up with PCP in a week.   Please monitor labs including CBC and BMP when you see your PCP.  Low-salt diet recommended.   Increase activity slowly   Complete by:  As directed      Allergies as of 09/14/2017      Reactions   Imdur [isosorbide Dinitrate]    headaches   Penicillins Other (See Comments)   Per pt unknown reaction      Medication List    STOP taking these medications   furosemide 40 MG tablet Commonly known as:  LASIX   labetalol 200 MG tablet Commonly known as:  NORMODYNE   lisinopril 20 MG tablet Commonly known as:  PRINIVIL,ZESTRIL   potassium chloride 10 MEQ tablet Commonly known as:  K-DUR     TAKE these medications   amLODipine 10 MG tablet Commonly known as:  NORVASC Take 1 tablet (10 mg total) by mouth daily.   aspirin 81 MG EC tablet Take 1 tablet (81 mg total) by mouth daily.   atorvastatin 80 MG tablet Commonly known as:  LIPITOR Take 1 tablet (80 mg total) by mouth daily at 6 PM. What changed:    medication strength  how much to take   carvedilol 25 MG tablet Commonly known as:  COREG Take 1 tablet (25 mg total) by mouth 2 (two) times daily with a meal.   hydrALAZINE 25 MG tablet Commonly known as:  APRESOLINE Take 3 tablets (75 mg total) by mouth every 8 (eight) hours. What changed:    how much to take  when to take this   irbesartan 150 MG tablet Commonly known as:  AVAPRO Take 1 tablet (150 mg total) by mouth daily. Start taking on:  09/15/2017   metFORMIN 500 MG tablet Commonly known as:  GLUCOPHAGE Take 1 tablet (500 mg total) by mouth 2 (two) times daily with a meal.   sertraline 50 MG tablet Commonly known as:  ZOLOFT Take 1/2 tab PO daily x 2 wks then 1 tab PO daily What changed:    how much to take  how to take this  when to take this  additional instructions   sildenafil 100 MG tablet Commonly known as:  VIAGRA Take 1/2-1 tab PO 30 mins before intercourse PRN. Limit to 100 mg/24 hrs   spironolactone 25 MG tablet Commonly known  as:  ALDACTONE Take 1 tablet (25 mg total) by mouth daily. Start taking on:  09/15/2017      Follow-up Information    Marcine Matar, MD. Schedule an appointment as soon as possible for a visit in 1 week(s).   Specialty:  Internal Medicine Contact information: [address] [place] [address] [phone]          Allergies  Allergen Reactions  . Imdur [Isosorbide Dinitrate]     headaches  . Penicillins Other (See Comments)    Per pt unknown reaction    Consultations: Cardiology  Procedures/Studies: Echo  Subjective: Seen and examined at bedside.  Reported feeling good.  Denies headache, dizziness, nausea, vomiting, chest pain, shortness of breath.  Understand discharge medication and instructions.  Discharge Exam: Vitals:   09/14/17 0941 09/14/17 0942  BP: 125/69   Pulse:    Resp:  (!) 21  Temp:    SpO2:     Vitals:   09/14/17 0552 09/14/17 0837 09/14/17 0941 09/14/17 0942  BP: (!) 166/102 (!) 176/110 125/69   Pulse: 73 77    Resp:  (!) 22  (!) 21  Temp:  98 F (36.7 C)    TempSrc:  Oral    SpO2: 96%     Weight:      Height:        General: Pt is alert, awake, not in acute distress Cardiovascular: RRR, S1/S2 +, no rubs, no gallops Respiratory: CTA bilaterally, no wheezing, no rhonchi Abdominal: Soft, NT, ND, bowel sounds + Extremities: no edema, no cyanosis    The results of significant diagnostics from this hospitalization (including imaging, microbiology, ancillary and laboratory) are listed below for reference.     Microbiology: No results found for this or any previous visit (from the past 240 hour(s)).   Labs: BNP (last 3 results) Recent Labs    11/17/16 2106 09/09/17 1547  BNP 384.3* 137.2*   Basic Metabolic Panel: Recent Labs  Lab 09/10/17 0451 09/11/17 0207 09/12/17 0244 09/13/17 0721 09/14/17 0253  NA 139 139 138 138 138  K 4.5 3.7 3.5 3.9 4.1  CL 101 99* 100* 101 102  CO2 25 27 28 25 24   GLUCOSE 188* 142*  150* 134* 165*  BUN 9 11 13 18  21*  CREATININE 1.18 1.39* 1.23 1.22 1.31*  CALCIUM 9.2 8.8* 9.1 9.3 9.1  MG  --   --  1.8  --   --    Liver Function Tests: No results for input(s): AST, ALT, ALKPHOS, BILITOT, PROT, ALBUMIN in the last 168 hours. No results for input(s): LIPASE, AMYLASE in the last 168 hours. No results for input(s): AMMONIA in the last 168 hours. CBC: Recent Labs  Lab 09/09/17 1512 09/10/17 0451  WBC 9.6 11.0*  HGB 14.4 14.4  HCT 41.0 41.8  MCV 69.8* 70.5*  PLT 228 236   Cardiac Enzymes: Recent Labs  Lab 09/09/17 2207 09/10/17 1221 09/10/17 1259  TROPONINI 0.08* 0.06* 0.04*   BNP: Invalid input(s): POCBNP CBG: No results for input(s): GLUCAP in the last 168 hours. D-Dimer No results for input(s): DDIMER in the last 72 hours. Hgb A1c Recent Labs    09/11/17 1851  HGBA1C 6.9*   Lipid Profile No results for input(s): CHOL, HDL, LDLCALC, TRIG, CHOLHDL, LDLDIRECT in the last 72 hours. Thyroid function studies No results for input(s): TSH, T4TOTAL, T3FREE, THYROIDAB in the last 72 hours.  Invalid input(s): FREET3 Anemia work up No results for input(s): VITAMINB12, FOLATE, FERRITIN, TIBC, IRON, RETICCTPCT in the last 72 hours. Urinalysis    Component Value Date/Time   COLORURINE YELLOW 09/09/2017 1801   APPEARANCEUR CLEAR 09/09/2017 1801   LABSPEC 1.012 09/09/2017 1801   PHURINE 7.0 09/09/2017 1801   GLUCOSEU NEGATIVE 09/09/2017 1801   HGBUR NEGATIVE 09/09/2017 1801   BILIRUBINUR NEGATIVE 09/09/2017 1801   KETONESUR NEGATIVE 09/09/2017 1801   PROTEINUR >=300 (A) 09/09/2017 1801   NITRITE NEGATIVE 09/09/2017 1801   LEUKOCYTESUR NEGATIVE 09/09/2017 1801   Sepsis Labs Invalid input(s): PROCALCITONIN,  WBC,  LACTICIDVEN Microbiology No results found for this or any previous visit (from the past 240 hour(s)).   Time coordinating discharge: 33 minutes  SIGNED:   Maxie Barb, MD  [facility] 09/14/2017, 10:20 AM  If  7PM-7AM,  please contact night-coverage [email or web address] Password [email or web address]

## 2017-09-18 ENCOUNTER — Encounter: Payer: Self-pay | Admitting: Internal Medicine

## 2017-09-18 ENCOUNTER — Telehealth: Payer: Self-pay | Admitting: Internal Medicine

## 2017-09-18 ENCOUNTER — Ambulatory Visit: Payer: Self-pay | Attending: Internal Medicine | Admitting: Internal Medicine

## 2017-09-18 VITALS — BP 153/101 | HR 86 | Temp 98.2°F | Resp 16 | Ht 72.0 in | Wt >= 6400 oz

## 2017-09-18 DIAGNOSIS — E119 Type 2 diabetes mellitus without complications: Secondary | ICD-10-CM | POA: Insufficient documentation

## 2017-09-18 DIAGNOSIS — E785 Hyperlipidemia, unspecified: Secondary | ICD-10-CM

## 2017-09-18 DIAGNOSIS — I10 Essential (primary) hypertension: Secondary | ICD-10-CM

## 2017-09-18 DIAGNOSIS — G4733 Obstructive sleep apnea (adult) (pediatric): Secondary | ICD-10-CM

## 2017-09-18 DIAGNOSIS — F329 Major depressive disorder, single episode, unspecified: Secondary | ICD-10-CM

## 2017-09-18 DIAGNOSIS — Z7982 Long term (current) use of aspirin: Secondary | ICD-10-CM | POA: Insufficient documentation

## 2017-09-18 DIAGNOSIS — I5032 Chronic diastolic (congestive) heart failure: Secondary | ICD-10-CM

## 2017-09-18 DIAGNOSIS — Z6841 Body Mass Index (BMI) 40.0 and over, adult: Secondary | ICD-10-CM | POA: Insufficient documentation

## 2017-09-18 DIAGNOSIS — I110 Hypertensive heart disease with heart failure: Secondary | ICD-10-CM | POA: Insufficient documentation

## 2017-09-18 DIAGNOSIS — E1129 Type 2 diabetes mellitus with other diabetic kidney complication: Secondary | ICD-10-CM

## 2017-09-18 DIAGNOSIS — F32A Depression, unspecified: Secondary | ICD-10-CM

## 2017-09-18 DIAGNOSIS — Z9989 Dependence on other enabling machines and devices: Secondary | ICD-10-CM

## 2017-09-18 DIAGNOSIS — Z880 Allergy status to penicillin: Secondary | ICD-10-CM | POA: Insufficient documentation

## 2017-09-18 DIAGNOSIS — N529 Male erectile dysfunction, unspecified: Secondary | ICD-10-CM | POA: Insufficient documentation

## 2017-09-18 DIAGNOSIS — R809 Proteinuria, unspecified: Secondary | ICD-10-CM

## 2017-09-18 LAB — GLUCOSE, POCT (MANUAL RESULT ENTRY): POC GLUCOSE: 154 mg/dL — AB (ref 70–99)

## 2017-09-18 MED ORDER — SPIRONOLACTONE 25 MG PO TABS: 25.0000 mg | ORAL_TABLET | Freq: Every day | ORAL | 3 refills | Status: DC

## 2017-09-18 MED ORDER — IRBESARTAN 150 MG PO TABS: 150.0000 mg | ORAL_TABLET | Freq: Every day | ORAL | 3 refills | Status: DC

## 2017-09-18 MED ORDER — CARVEDILOL 25 MG PO TABS: 25.0000 mg | ORAL_TABLET | Freq: Two times a day (BID) | ORAL | 3 refills | Status: DC

## 2017-09-18 MED ORDER — AMLODIPINE BESYLATE 10 MG PO TABS: 10.0000 mg | ORAL_TABLET | Freq: Every day | ORAL | 3 refills | Status: DC

## 2017-09-18 MED ORDER — ATORVASTATIN CALCIUM 80 MG PO TABS: 40.0000 mg | ORAL_TABLET | Freq: Every day | ORAL | 2 refills | Status: DC

## 2017-09-18 MED ORDER — HYDRALAZINE HCL 25 MG PO TABS: 75.0000 mg | ORAL_TABLET | Freq: Three times a day (TID) | ORAL | 6 refills | Status: DC

## 2017-09-18 MED ORDER — METFORMIN HCL 500 MG PO TABS: 500.0000 mg | ORAL_TABLET | Freq: Two times a day (BID) | ORAL | 3 refills | Status: DC

## 2017-09-18 MED ORDER — SERTRALINE HCL 50 MG PO TABS: 50.0000 mg | ORAL_TABLET | Freq: Every day | ORAL | 5 refills | Status: DC

## 2017-09-18 NOTE — Telephone Encounter (Signed)
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 Please follow up

## 2017-09-18 NOTE — Progress Notes (Signed)
Patient ID: Paul HOLDERMAN, male    DOB: Jan 05, 1986  MRN: [id number]  CC: Hospitalization Follow-up   Subjective: Paul Lamb is a 32 y.o. male who presents for hosp f/u.  Wife is with him His concerns today include:  Patient with history of diastolic CHF with EF of 50-55%, HTN, OSA on CPAP, HL, morbid obesity, prediabetes.  Pt hosp 1/19-24/2019 with hypertensive emergency with systolic blood pressure of 250 and lower extremity edema.  Patient had ran out of his blood pressure medications for 3 days.  He responded to nitro drip.  He was found to have proteinuria. -Since discharge he has been compliant with meds.  He is taking Hydralazine 25 mg Q 8hrs instead of 75 mg.  Did not realize that he was supposed to be taking three of the 25 mg tablets at a time -limits salt  -no HA/dizziness/CP/PND/orthopnea -+ Mild LE edema since hospital Requests release to return to work.  He works as a Naval architect  OSA: using CPAP consistently  DM:  Reports compliance with Metformin.  Trying to choose more healthy food choices when on the road  Depression:  Doing better on Zoloft   Patient Active Problem List   Diagnosis Date Noted  . Diabetes mellitus, new onset (HCC)   . Proteinuria   . Elevated troponin   . Chest pressure   . Erectile dysfunction 05/19/2017  . Depression 05/19/2017  . CHF (congestive heart failure) (HCC) 11/18/2016  . Sleep apnea 01/12/2016  . Cardiomegaly - hypertensive   . Abnormal EKG   . Hypertensive emergency 11/01/2015  . Hand pain, left 11/01/2015  . Morbid obesity, unspecified obesity type (HCC) 11/01/2015     Current Outpatient Medications on File Prior to Visit  Medication Sig Dispense Refill  . aspirin 81 MG EC tablet Take 1 tablet (81 mg total) by mouth daily. 30 tablet 0  . sildenafil (VIAGRA) 100 MG tablet Take 1/2-1 tab PO 30 mins before intercourse PRN. Limit to 100 mg/24 hrs 30 tablet 3   No current facility-administered medications on file  prior to visit.     Allergies  Allergen Reactions  . Imdur [Isosorbide Dinitrate]     headaches  . Penicillins Other (See Comments)    Per pt unknown reaction    Social History   Socioeconomic History  . Marital status: Married    Spouse name: Not on file  . Number of children: Not on file  . Years of education: Not on file  . Highest education level: Not on file  Social Needs  . Financial resource strain: Not on file  . Food insecurity - worry: Not on file  . Food insecurity - inability: Not on file  . Transportation needs - medical: Not on file  . Transportation needs - non-medical: Not on file  Occupational History  . Not on file  Tobacco Use  . Smoking status: Never Smoker  . Smokeless tobacco: Never Used  Substance and Sexual Activity  . Alcohol use: No    Alcohol/week: 0.0 oz  . Drug use: No  . Sexual activity: Yes  Other Topics Concern  . Not on file  Social History Narrative  . Not on file    Family History  Problem Relation Age of Onset  . Hypertension Unknown   . Diabetes Mellitus II Unknown     Past Surgical History:  Procedure Laterality Date  . NO PAST SURGERIES      ROS: Review of Systems Negative except  as stated above PHYSICAL EXAM: BP (!) 153/101   Pulse 86   Temp 98.2 F (36.8 C) (Oral)   Resp 16   Ht 6' (1.829 m)   Wt (!) 433 lb 9.6 oz (196.7 kg)   SpO2 95%   BMI 58.81 kg/m   Wt Readings from Last 3 Encounters:  09/18/17 (!) 433 lb 9.6 oz (196.7 kg)  09/14/17 (!) 429 lb (194.6 kg)  05/19/17 (!) 443 lb 6.4 oz (201.1 kg)   Repeat BP152/80 Physical Exam  General appearance - alert, well appearing, obese African-American male and in no distress Mental status - alert, oriented to person, place, and time, normal mood, behavior, speech, dress, motor activity, and thought processes Neck - supple, no significant adenopathy Chest - clear to auscultation, no wheezes, rales or rhonchi, symmetric air entry Heart - normal rate,  regular rhythm, normal S1, S2, no murmurs, rubs, clicks or gallops Extremities - peripheral pulses normal, no pedal edema, no clubbing or cyanosis    Chemistry      Component Value Date/Time   NA 138 09/14/2017 0253   NA 142 05/19/2017 1344   K 4.1 09/14/2017 0253   CL 102 09/14/2017 0253   CO2 24 09/14/2017 0253   BUN 21 (H) 09/14/2017 0253   BUN 12 05/19/2017 1344   CREATININE 1.31 (H) 09/14/2017 0253   CREATININE 1.05 11/25/2015 1324      Component Value Date/Time   CALCIUM 9.1 09/14/2017 0253   ALKPHOS 83 05/19/2017 1344   AST 24 05/19/2017 1344   ALT 19 05/19/2017 1344   BILITOT 1.0 05/19/2017 1344     Lab Results  Component Value Date   WBC 11.0 (H) 09/10/2017   HGB 14.4 09/10/2017   HCT 41.8 09/10/2017   MCV 70.5 (L) 09/10/2017   PLT 236 09/10/2017   Lab Results  Component Value Date   HGBA1C 6.9 (H) 09/11/2017    ASSESSMENT AND PLAN: 1. Malignant hypertension Stressed the importance of compliance.  We agreed to do 59-month supply at a time to help prevent him from running out of medications. -Increase hydralazine to 75 mg 3 times a day as intended - amLODipine (NORVASC) 10 MG tablet; Take 1 tablet (10 mg total) by mouth daily.  Dispense: 90 tablet; Refill: 3 - irbesartan (AVAPRO) 150 MG tablet; Take 1 tablet (150 mg total) by mouth daily.  Dispense: 90 tablet; Refill: 3 - carvedilol (COREG) 25 MG tablet; Take 1 tablet (25 mg total) by mouth 2 (two) times daily with a meal.  Dispense: 120 tablet; Refill: 3 - spironolactone (ALDACTONE) 25 MG tablet; Take 1 tablet (25 mg total) by mouth daily.  Dispense: 90 tablet; Refill: 3 - hydrALAZINE (APRESOLINE) 25 MG tablet; Take 3 tablets (75 mg total) by mouth every 8 (eight) hours.  Dispense: 270 tablet; Refill: 6  2. Chronic diastolic congestive heart failure (HCC) -Clinically stable.  3. OSA on CPAP Continue to use CPAP daily  4. Controlled type 2 diabetes mellitus with microalbuminuria, without long-term current  use of insulin (HCC) -At goal.  Continue metformin. We discussed healthy eating habits. Encourage increase physical activity as tolerated - POCT glucose (manual entry) - Microalbumin / creatinine urine ratio - Basic metabolic panel - metFORMIN (GLUCOPHAGE) 500 MG tablet; Take 1 tablet (500 mg total) by mouth 2 (two) times daily with a meal.  Dispense: 180 tablet; Refill: 3  5. Depression, unspecified depression type - sertraline (ZOLOFT) 50 MG tablet; Take 1 tablet (50 mg total) by mouth daily.  Dispense: 30 tablet; Refill: 5  6. Hyperlipidemia, unspecified hyperlipidemia type - atorvastatin (LIPITOR) 80 MG tablet; Take 0.5 tablets (40 mg total) by mouth daily at 6 PM.  Dispense: 90 tablet; Refill: 2  Patient was given the opportunity to ask questions.  Patient verbalized understanding of the plan and was able to repeat key elements of the plan.   Orders Placed This Encounter  Procedures  . Microalbumin / creatinine urine ratio  . Basic metabolic panel  . POCT glucose (manual entry)     Requested Prescriptions   Signed Prescriptions Disp Refills  . amLODipine (NORVASC) 10 MG tablet 90 tablet 3    Sig: Take 1 tablet (10 mg total) by mouth daily.  . sertraline (ZOLOFT) 50 MG tablet 30 tablet 5    Sig: Take 1 tablet (50 mg total) by mouth daily.  . irbesartan (AVAPRO) 150 MG tablet 90 tablet 3    Sig: Take 1 tablet (150 mg total) by mouth daily.  . carvedilol (COREG) 25 MG tablet 120 tablet 3    Sig: Take 1 tablet (25 mg total) by mouth 2 (two) times daily with a meal.  . spironolactone (ALDACTONE) 25 MG tablet 90 tablet 3    Sig: Take 1 tablet (25 mg total) by mouth daily.  . metFORMIN (GLUCOPHAGE) 500 MG tablet 180 tablet 3    Sig: Take 1 tablet (500 mg total) by mouth 2 (two) times daily with a meal.  . hydrALAZINE (APRESOLINE) 25 MG tablet 270 tablet 6    Sig: Take 3 tablets (75 mg total) by mouth every 8 (eight) hours.  Marland Kitchen atorvastatin (LIPITOR) 80 MG tablet 90 tablet 2     Sig: Take 0.5 tablets (40 mg total) by mouth daily at 6 PM.    Return in about 3 months (around 12/17/2017).  [name], MD, FACP

## 2017-09-18 NOTE — Telephone Encounter (Signed)
Pt was contacted and had an appointment scheudled for 2/1. Pt was rescheduled for this afternoon at 145pm.

## 2017-09-18 NOTE — Progress Notes (Signed)
Pt is in the office today to be cleared for work.

## 2017-09-19 ENCOUNTER — Encounter: Payer: Self-pay | Admitting: Internal Medicine

## 2017-09-19 ENCOUNTER — Other Ambulatory Visit: Payer: Self-pay | Admitting: Internal Medicine

## 2017-09-19 LAB — MICROALBUMIN / CREATININE URINE RATIO
CREATININE, UR: 494.1 mg/dL
Microalb/Creat Ratio: 657.8 mg/g creat — ABNORMAL HIGH (ref 0.0–30.0)
Microalbumin, Urine: 3250.4 ug/mL

## 2017-09-19 LAB — BASIC METABOLIC PANEL
BUN/Creatinine Ratio: 13 (ref 9–20)
BUN: 16 mg/dL (ref 6–20)
CALCIUM: 9.5 mg/dL (ref 8.7–10.2)
CO2: 22 mmol/L (ref 20–29)
CREATININE: 1.21 mg/dL (ref 0.76–1.27)
Chloride: 101 mmol/L (ref 96–106)
GFR calc Af Amer: 92 mL/min/{1.73_m2} (ref >59)
GFR, EST NON AFRICAN AMERICAN: 80 mL/min/{1.73_m2} (ref >59)
GLUCOSE: 130 mg/dL — AB (ref 65–99)
Potassium: 4.6 mmol/L (ref 3.5–5.2)
Sodium: 140 mmol/L (ref 134–144)

## 2017-09-22 ENCOUNTER — Ambulatory Visit: Payer: Self-pay | Admitting: Internal Medicine

## 2017-10-26 MED FILL — SERTRALINE HCL 50 MG TABLET: 50 | 30 days supply | Qty: 30 | Fill #0

## 2017-10-26 MED FILL — SPIRONOLACTONE 25 MG TABS: 25 | 30 days supply | Qty: 30 | Fill #0

## 2017-10-26 MED FILL — $VIAGRA 100 MG TABLET: 100 | 30 days supply | Qty: 10 | Fill #0

## 2017-10-26 MED FILL — CARVEDILOL 25 MG TABLET: 25 | 30 days supply | Qty: 60 | Fill #0

## 2017-10-26 MED FILL — AMLODIPINE BESYLATE 10 MG T: 10 | 30 days supply | Qty: 30 | Fill #0

## 2017-10-26 MED FILL — metFORMIN HCL 500 MG TABS: 500 | 30 days supply | Qty: 60 | Fill #0

## 2017-10-26 MED FILL — IRBESARTAN 150 MG TABS: 150 | 30 days supply | Qty: 30 | Fill #0

## 2017-10-26 MED FILL — hydrALAZINE HCL 25 MG TABS: 25 | 30 days supply | Qty: 270 | Fill #0

## 2017-10-31 ENCOUNTER — Encounter: Payer: Self-pay | Admitting: Internal Medicine

## 2017-11-24 ENCOUNTER — Encounter: Payer: Self-pay | Admitting: Internal Medicine

## 2017-11-26 ENCOUNTER — Encounter (HOSPITAL_COMMUNITY): Payer: Self-pay

## 2017-11-26 ENCOUNTER — Other Ambulatory Visit: Payer: Self-pay

## 2017-11-26 ENCOUNTER — Emergency Department (HOSPITAL_COMMUNITY): Payer: Medicaid Other

## 2017-11-26 ENCOUNTER — Inpatient Hospital Stay (HOSPITAL_COMMUNITY)
Admission: EM | Admit: 2017-11-26 | Discharge: 2017-11-29 | DRG: 638 | Disposition: A | Discharge status: Home / Self Care | Payer: Medicaid Other | Attending: Family Medicine | Admitting: Family Medicine

## 2017-11-26 DIAGNOSIS — I5042 Chronic combined systolic (congestive) and diastolic (congestive) heart failure: Secondary | ICD-10-CM | POA: Diagnosis present

## 2017-11-26 DIAGNOSIS — Z9119 Patient's noncompliance with other medical treatment and regimen: Secondary | ICD-10-CM | POA: Diagnosis not present

## 2017-11-26 DIAGNOSIS — Z8249 Family history of ischemic heart disease and other diseases of the circulatory system: Secondary | ICD-10-CM

## 2017-11-26 DIAGNOSIS — E1110 Type 2 diabetes mellitus with ketoacidosis without coma: Principal | ICD-10-CM | POA: Diagnosis present

## 2017-11-26 DIAGNOSIS — Z7984 Long term (current) use of oral hypoglycemic drugs: Secondary | ICD-10-CM

## 2017-11-26 DIAGNOSIS — N529 Male erectile dysfunction, unspecified: Secondary | ICD-10-CM | POA: Diagnosis present

## 2017-11-26 DIAGNOSIS — Z7982 Long term (current) use of aspirin: Secondary | ICD-10-CM | POA: Diagnosis not present

## 2017-11-26 DIAGNOSIS — I110 Hypertensive heart disease with heart failure: Secondary | ICD-10-CM | POA: Diagnosis present

## 2017-11-26 DIAGNOSIS — Z888 Allergy status to other drugs, medicaments and biological substances status: Secondary | ICD-10-CM | POA: Diagnosis not present

## 2017-11-26 DIAGNOSIS — N179 Acute kidney failure, unspecified: Secondary | ICD-10-CM | POA: Diagnosis present

## 2017-11-26 DIAGNOSIS — R0602 Shortness of breath: Secondary | ICD-10-CM

## 2017-11-26 DIAGNOSIS — R739 Hyperglycemia, unspecified: Secondary | ICD-10-CM

## 2017-11-26 DIAGNOSIS — Z880 Allergy status to penicillin: Secondary | ICD-10-CM

## 2017-11-26 DIAGNOSIS — G4733 Obstructive sleep apnea (adult) (pediatric): Secondary | ICD-10-CM | POA: Diagnosis present

## 2017-11-26 DIAGNOSIS — F329 Major depressive disorder, single episode, unspecified: Secondary | ICD-10-CM | POA: Diagnosis present

## 2017-11-26 DIAGNOSIS — Z91119 Patient's noncompliance with dietary regimen due to unspecified reason: Secondary | ICD-10-CM

## 2017-11-26 DIAGNOSIS — Z6841 Body Mass Index (BMI) 40.0 and over, adult: Secondary | ICD-10-CM | POA: Diagnosis not present

## 2017-11-26 DIAGNOSIS — Z79899 Other long term (current) drug therapy: Secondary | ICD-10-CM | POA: Diagnosis not present

## 2017-11-26 DIAGNOSIS — Z833 Family history of diabetes mellitus: Secondary | ICD-10-CM

## 2017-11-26 DIAGNOSIS — I509 Heart failure, unspecified: Secondary | ICD-10-CM

## 2017-11-26 DIAGNOSIS — E785 Hyperlipidemia, unspecified: Secondary | ICD-10-CM | POA: Diagnosis present

## 2017-11-26 DIAGNOSIS — Z9111 Patient's noncompliance with dietary regimen: Secondary | ICD-10-CM | POA: Diagnosis not present

## 2017-11-26 DIAGNOSIS — I5030 Unspecified diastolic (congestive) heart failure: Secondary | ICD-10-CM

## 2017-11-26 DIAGNOSIS — G4730 Sleep apnea, unspecified: Secondary | ICD-10-CM | POA: Diagnosis present

## 2017-11-26 DIAGNOSIS — G4734 Idiopathic sleep related nonobstructive alveolar hypoventilation: Secondary | ICD-10-CM

## 2017-11-26 LAB — BASIC METABOLIC PANEL
ANION GAP: 19 — AB (ref 5–15)
BUN: 27 mg/dL — ABNORMAL HIGH (ref 6–20)
CHLORIDE: 86 mmol/L — AB (ref 101–111)
CO2: 16 mmol/L — ABNORMAL LOW (ref 22–32)
Calcium: 9.6 mg/dL (ref 8.9–10.3)
Creatinine, Ser: 2.32 mg/dL — ABNORMAL HIGH (ref 0.61–1.24)
GFR calc Af Amer: 42 mL/min — ABNORMAL LOW (ref >60)
GFR calc non Af Amer: 36 mL/min — ABNORMAL LOW (ref >60)
Glucose, Bld: 954 mg/dL (ref 65–99)
POTASSIUM: 5.5 mmol/L — AB (ref 3.5–5.1)
SODIUM: 121 mmol/L — AB (ref 135–145)

## 2017-11-26 LAB — CBC
HEMATOCRIT: 42.7 % (ref 39.0–52.0)
HEMOGLOBIN: 14.7 g/dL (ref 13.0–17.0)
MCH: 23.9 pg — ABNORMAL LOW (ref 26.0–34.0)
MCHC: 34.4 g/dL (ref 30.0–36.0)
MCV: 69.4 fL — ABNORMAL LOW (ref 78.0–100.0)
Platelets: 264 10*3/uL (ref 150–400)
RBC: 6.15 MIL/uL — AB (ref 4.22–5.81)
RDW: 14.2 % (ref 11.5–15.5)
WBC: 11.3 10*3/uL — AB (ref 4.0–10.5)

## 2017-11-26 LAB — I-STAT VENOUS BLOOD GAS, ED
ACID-BASE DEFICIT: 9 mmol/L — AB (ref 0.0–2.0)
BICARBONATE: 19.8 mmol/L — AB (ref 20.0–28.0)
O2 Saturation: 69 %
PH VEN: 7.158 — AB (ref 7.250–7.430)
PO2 VEN: 46 mmHg — AB (ref 32.0–45.0)
TCO2: 22 mmol/L (ref 22–32)
pCO2, Ven: 55.9 mmHg (ref 44.0–60.0)

## 2017-11-26 LAB — URINALYSIS, ROUTINE W REFLEX MICROSCOPIC
BACTERIA UA: NONE SEEN
BILIRUBIN URINE: NEGATIVE
Glucose, UA: >=500 mg/dL — AB
Hgb urine dipstick: NEGATIVE
KETONES UR: 20 mg/dL — AB
LEUKOCYTES UA: NEGATIVE
NITRITE: NEGATIVE
PH: 6 (ref 5.0–8.0)
PROTEIN: NEGATIVE mg/dL
Specific Gravity, Urine: 1.026 (ref 1.005–1.030)
Squamous Epithelial / LPF: NONE SEEN

## 2017-11-26 LAB — D-DIMER, QUANTITATIVE: D-Dimer, Quant: 0.57 ug/mL-FEU — ABNORMAL HIGH (ref 0.00–0.50)

## 2017-11-26 LAB — CBG MONITORING, ED
Glucose-Capillary: >600 mg/dL (ref 65–99)
Glucose-Capillary: >600 mg/dL (ref 65–99)
Glucose-Capillary: >600 mg/dL (ref 65–99)
Glucose-Capillary: >600 mg/dL (ref 65–99)
Glucose-Capillary: 503 mg/dL (ref 65–99)

## 2017-11-26 LAB — TROPONIN I: Troponin I: <0.03 ng/mL (ref <0.03)

## 2017-11-26 MED ORDER — DEXTROSE-NACL 5-0.45 % IV SOLN
INTRAVENOUS | Status: DC
  Administered 2017-11-27 (×2): via INTRAVENOUS

## 2017-11-26 MED ORDER — AMLODIPINE BESYLATE 5 MG PO TABS
10.0000 mg | ORAL_TABLET | Freq: Every day | ORAL | Status: DC
  Administered 2017-11-27 – 2017-11-29 (×3): 10 mg via ORAL
  Filled 2017-11-26 (×3): qty 2

## 2017-11-26 MED ORDER — DEXTROSE-NACL 5-0.45 % IV SOLN: INTRAVENOUS | Status: DC

## 2017-11-26 MED ORDER — SODIUM CHLORIDE 0.9 % IV SOLN
INTRAVENOUS | Status: DC
  Administered 2017-11-26 (×2): 125 mL/h via INTRAVENOUS

## 2017-11-26 MED ORDER — SODIUM CHLORIDE 0.9 % IV BOLUS
1000.0000 mL | Freq: Once | INTRAVENOUS | Status: AC
  Administered 2017-11-26: 1000 mL via INTRAVENOUS

## 2017-11-26 MED ORDER — CARVEDILOL 25 MG PO TABS
25.0000 mg | ORAL_TABLET | Freq: Two times a day (BID) | ORAL | Status: DC
  Administered 2017-11-27 – 2017-11-29 (×5): 25 mg via ORAL
  Filled 2017-11-26 (×5): qty 1

## 2017-11-26 MED ORDER — ATORVASTATIN CALCIUM 40 MG PO TABS
40.0000 mg | ORAL_TABLET | Freq: Every day | ORAL | Status: DC
  Administered 2017-11-27 – 2017-11-28 (×2): 40 mg via ORAL
  Filled 2017-11-26 (×2): qty 1

## 2017-11-26 MED ORDER — ASPIRIN EC 81 MG PO TBEC
81.0000 mg | DELAYED_RELEASE_TABLET | Freq: Every day | ORAL | Status: DC
  Administered 2017-11-27 – 2017-11-29 (×3): 81 mg via ORAL
  Filled 2017-11-26 (×3): qty 1

## 2017-11-26 MED ORDER — HYDRALAZINE HCL 25 MG PO TABS
75.0000 mg | ORAL_TABLET | Freq: Three times a day (TID) | ORAL | Status: DC
  Administered 2017-11-27 – 2017-11-29 (×7): 75 mg via ORAL
  Filled 2017-11-26 (×7): qty 1

## 2017-11-26 MED ORDER — SODIUM CHLORIDE 0.9 % IV SOLN
INTRAVENOUS | Status: DC
  Administered 2017-11-27: 10.5 [IU]/h via INTRAVENOUS
  Filled 2017-11-26 (×3): qty 1

## 2017-11-26 MED ORDER — SODIUM CHLORIDE 0.9 % IV SOLN
INTRAVENOUS | Status: DC
  Administered 2017-11-26: 125 mL/h via INTRAVENOUS

## 2017-11-26 MED ORDER — SERTRALINE HCL 50 MG PO TABS
50.0000 mg | ORAL_TABLET | Freq: Every day | ORAL | Status: DC
  Administered 2017-11-27 – 2017-11-29 (×3): 50 mg via ORAL
  Filled 2017-11-26 (×3): qty 1

## 2017-11-26 MED ORDER — ENOXAPARIN SODIUM 100 MG/ML ~~LOC~~ SOLN
90.0000 mg | SUBCUTANEOUS | Status: DC
  Administered 2017-11-27: 90 mg via SUBCUTANEOUS
  Filled 2017-11-26: qty 1

## 2017-11-26 MED ORDER — SODIUM CHLORIDE 0.9 % IV SOLN
INTRAVENOUS | Status: DC
  Administered 2017-11-26: 5.4 [IU]/h via INTRAVENOUS
  Filled 2017-11-26: qty 1

## 2017-11-26 MED ORDER — SODIUM CHLORIDE 0.9 % IV SOLN: INTRAVENOUS | Status: DC

## 2017-11-26 MED ORDER — LACTATED RINGERS IV SOLN
INTRAVENOUS | Status: DC
  Administered 2017-11-26: 23:00:00 via INTRAVENOUS

## 2017-11-26 NOTE — ED Provider Notes (Signed)
Medical screening examination/treatment/procedure(s) were conducted as a shared visit with non-physician practitioner(s) and myself.  I personally evaluated the patient during the encounter.  None Reports he knows he is diabetic.  He takes metformin.  He has not been checking his blood sugar.  He reports he has been on vacation and started to get urinary frequency, blurred vision and fatigue dizziness.Marland Kitchen  He used his mother's glucometer and his blood sugar read high.  He sought treatment at the emergency department.  Patient is alert and appropriate.  Morbid obesity.  Heart regular.  Lungs grossly clear.  Abdomen soft without guarding.  No peripheral edema.  Patient follows commands without difficulty.   Patient presents with significantly elevated blood glucose greater than 600.  Patient has uncontrolled hyperglycemia with anion gap and acidosis consistent with DKA.  His mental status is clear.  He is answering questions appropriately without signs of confusion.  Insulin drip initiated.  Patient has had some hypoxia on the monitor when he falls asleep.  This may be sleep apnea but I do agree with proceeding with other diagnostic workup for cardiopulmonary etiology.  He does not have complaints of chest pain or shortness of breath at this time.  CRITICAL CARE Performed by: Cristy Friedlander   Total critical care time: 30 minutes  Critical care time was exclusive of separately billable procedures and treating other patients.  Critical care was necessary to treat or prevent imminent or life-threatening deterioration.  Critical care was time spent personally by me on the following activities: development of treatment plan with patient and/or surrogate as well as nursing, discussions with consultants, evaluation of patient's response to treatment, examination of patient, obtaining history from patient or surrogate, ordering and performing treatments and interventions, ordering and review of  laboratory studies, ordering and review of radiographic studies, pulse oximetry and re-evaluation of patient's condition.     Arby Barrette, MD 11/30/17 1225

## 2017-11-26 NOTE — ED Provider Notes (Signed)
[name] EMERGENCY DEPARTMENT Provider Note   CSN: [id number] Arrival date & time: 11/26/17  1612     History   Chief Complaint Chief Complaint  Patient presents with  . Urinary Frequency  . Hyperglycemia    HPI Paul Lamb is a 32 y.o. male.  The history is provided by the patient. No language interpreter was used.  Hyperglycemia  Blood sugar level PTA:  Over 600 Severity:  Severe Onset quality:  Sudden Duration:  2 weeks Timing:  Constant Progression:  Worsening Chronicity:  New Diabetes status:  Controlled with oral medications Current diabetic therapy:  Metformin Time since last antidiabetic medication:  1 day Relieved by:  Nothing Ineffective treatments:  None tried Associated symptoms: fatigue   Associated symptoms: no abdominal pain   Risk factors: obesity   Pt reports he is having frequent urination and dizziness.  Pt reports cbg has been high at home.  Pt reports he has been losing weight.    Past Medical History:  Diagnosis Date  . Acute combined systolic and diastolic CHF, NYHA class 3 (HCC) 11/18/2016   EF now 40-45% by echo  . Acute diastolic (congestive) heart failure (HCC) 01/12/2016  . Diabetes mellitus, new onset (HCC)   . Hypertension   . Morbid obesity (HCC)   . OSA (obstructive sleep apnea)     Patient Active Problem List   Diagnosis Date Noted  . Diabetes mellitus, new onset (HCC)   . Proteinuria   . Elevated troponin   . Chest pressure   . Erectile dysfunction 05/19/2017  . Depression 05/19/2017  . CHF (congestive heart failure) (HCC) 11/18/2016  . Sleep apnea 01/12/2016  . Cardiomegaly - hypertensive   . Abnormal EKG   . Hypertensive emergency 11/01/2015  . Hand pain, left 11/01/2015  . Morbid obesity, unspecified obesity type (HCC) 11/01/2015    Past Surgical History:  Procedure Laterality Date  . NO PAST SURGERIES          Home Medications    Prior to Admission medications   Medication Sig  Start Date End Date Taking? Authorizing Provider  amLODipine (NORVASC) 10 MG tablet Take 1 tablet (10 mg total) by mouth daily. 09/18/17 10/18/17  Marcine Matar, MD  aspirin 81 MG EC tablet Take 1 tablet (81 mg total) by mouth daily. 09/14/17   Maxie Barb, MD  atorvastatin (LIPITOR) 80 MG tablet Take 0.5 tablets (40 mg total) by mouth daily at 6 PM. 09/18/17   Marcine Matar, MD  carvedilol (COREG) 25 MG tablet Take 1 tablet (25 mg total) by mouth 2 (two) times daily with a meal. 09/18/17   Marcine Matar, MD  hydrALAZINE (APRESOLINE) 25 MG tablet Take 3 tablets (75 mg total) by mouth every 8 (eight) hours. 09/18/17   Marcine Matar, MD  irbesartan (AVAPRO) 150 MG tablet Take 1 tablet (150 mg total) by mouth daily. 09/18/17   Marcine Matar, MD  metFORMIN (GLUCOPHAGE) 500 MG tablet Take 1 tablet (500 mg total) by mouth 2 (two) times daily with a meal. 09/18/17   Marcine Matar, MD  sertraline (ZOLOFT) 50 MG tablet Take 1 tablet (50 mg total) by mouth daily. 09/18/17   Marcine Matar, MD  sildenafil (VIAGRA) 100 MG tablet Take 1/2-1 tab PO 30 mins before intercourse PRN. Limit to 100 mg/24 hrs 06/07/17   Marcine Matar, MD  spironolactone (ALDACTONE) 25 MG tablet Take 1 tablet (25 mg total) by mouth daily. 09/18/17  Marcine Matar, MD    Family History Family History  Problem Relation Age of Onset  . Hypertension Unknown   . Diabetes Mellitus II Unknown     Social History Social History   Tobacco Use  . Smoking status: Never Smoker  . Smokeless tobacco: Never Used  Substance Use Topics  . Alcohol use: No    Alcohol/week: 0.0 oz  . Drug use: No     Allergies   Imdur [isosorbide dinitrate] and Penicillins   Review of Systems Review of Systems  Constitutional: Positive for fatigue.  Gastrointestinal: Negative for abdominal pain.  All other systems reviewed and are negative.    Physical Exam Updated Vital Signs BP 114/71   Pulse 80    Temp 98.2 F (36.8 C) (Oral)   Resp 17   Ht 6' (1.829 m)   Wt (!) 181 kg (399 lb)   SpO2 (!) 89%   BMI 54.11 kg/m   Physical Exam  Constitutional: He appears well-developed and well-nourished.  HENT:  Head: Normocephalic.  Right Ear: External ear normal.  Left Ear: External ear normal.  Nose: Nose normal.  Mouth/Throat: Oropharynx is clear and moist.  Eyes: Pupils are equal, round, and reactive to light.  Neck: Normal range of motion.  Cardiovascular: Normal rate and regular rhythm.  Pulmonary/Chest: Effort normal and breath sounds normal.  Abdominal: Soft.  Musculoskeletal: Normal range of motion.  Neurological: He is alert.  Skin: Skin is warm.  Psychiatric: He has a normal mood and affect.  Nursing note and vitals reviewed.    ED Treatments / Results  Labs (all labs ordered are listed, but only abnormal results are displayed) Labs Reviewed  BASIC METABOLIC PANEL - Abnormal; Notable for the following components:      Result Value   Sodium 121 (*)    Potassium 5.5 (*)    Chloride 86 (*)    CO2 16 (*)    Glucose, Bld 954 (*)    BUN 27 (*)    Creatinine, Ser 2.32 (*)    GFR calc non Af Amer 36 (*)    GFR calc Af Amer 42 (*)    Anion gap 19 (*)    All other components within normal limits  CBC - Abnormal; Notable for the following components:   WBC 11.3 (*)    RBC 6.15 (*)    MCV 69.4 (*)    MCH 23.9 (*)    All other components within normal limits  URINALYSIS, ROUTINE W REFLEX MICROSCOPIC - Abnormal; Notable for the following components:   Color, Urine STRAW (*)    Glucose, UA >=500 (*)    Ketones, ur 20 (*)    All other components within normal limits  CBG MONITORING, ED - Abnormal; Notable for the following components:   Glucose-Capillary >600 (*)    All other components within normal limits  CBG MONITORING, ED - Abnormal; Notable for the following components:   Glucose-Capillary >600 (*)    All other components within normal limits  CBG MONITORING, ED  - Abnormal; Notable for the following components:   Glucose-Capillary >600 (*)    All other components within normal limits  BLOOD GAS, VENOUS    EKG None  Radiology No results found.  Procedures Procedures (including critical care time)  Medications Ordered in ED Medications  dextrose 5 %-0.45 % sodium chloride infusion (has no administration in time range)  insulin regular (NOVOLIN R,HUMULIN R) 100 Units in sodium chloride 0.9 % 100 mL (1  Units/mL) infusion (5.4 Units/hr Intravenous New Bag/Given 11/26/17 1948)  sodium chloride 0.9 % bolus 1,000 mL (0 mLs Intravenous Stopped 11/26/17 2100)    And  sodium chloride 0.9 % bolus 1,000 mL (0 mLs Intravenous Stopped 11/26/17 2100)    And  0.9 %  sodium chloride infusion (has no administration in time range)  0.9 %  sodium chloride infusion (has no administration in time range)     Initial Impression / Assessment and Plan / ED Course  I have reviewed the triage vital signs and the nursing notes.  Pertinent labs & imaging results that were available during my care of the patient were reviewed by me and considered in my medical decision making (see chart for details).     MDM  Pt given Iv fluids, placed on glucommander for Iv insulin.  Pt has a glucose of 974.  CO2 of 16, Anion gap  Of 19.   RN reports pt drops 02 sats to low 80's when he falls asleep.  Pt denies shortness of breath when awake.  Chest xray obtained and is normal.  I suspect sleep apnea however I will add troponin and ddimer.    Final Clinical Impressions(s) / ED Diagnoses   Final diagnoses:  Hyperglycemia  Diabetic ketoacidosis without coma associated with type 2 diabetes mellitus (HCC)  Hypoxia, sleep related    ED Discharge Orders    None    Hospitalist consulted.  Dr. Gentry Fitz advised LR x 2 liters.  He will see for admission.   Elson Areas, PA-C 11/26/17 2257    Arby Barrette, MD 11/30/17 1225

## 2017-11-26 NOTE — H&P (Signed)
History and Physical    Paul Lamb ZOX:[id number] DOB: 06-17-1986 DOA: 11/26/2017  PCP: Marcine Matar, MD  Patient coming from: Home.  Chief Complaint: Increased urination weakness and shortness of breath.  HPI: Paul Lamb is a 32 y.o. male with history of morbid obesity, sleep apnea, hypertension, diastolic CHF and diabetes mellitus type 2 has been experiencing increasing thirst polyuria and weakness fatigue and shortness of breath over the last 1 week.  Patient has been to the [facility] last week and has had a lot of soda.  Patient denies any chest pain loss of consciousness or palpitations.  Denies nausea vomiting or diarrhea.   ED Course:  In the ER patient is found to have blood sugar of 154 with creatinine increased from his baseline to 2.3.  Sodium was 121.Anion gap also was elevated at 19.  Chest x-ray was unremarkable EKG was showing nonspecific findings troponin was negative.  Since patient had recently travel d-dimer was done which is mildly elevated.  Will need VQ scan.  Review of Systems: As per HPI, rest all negative.   Past Medical History:  Diagnosis Date  . Acute combined systolic and diastolic CHF, NYHA class 3 (HCC) 11/18/2016   EF now 40-45% by echo  . Acute diastolic (congestive) heart failure (HCC) 01/12/2016  . Diabetes mellitus, new onset (HCC)   . Hypertension   . Morbid obesity (HCC)   . OSA (obstructive sleep apnea)     Past Surgical History:  Procedure Laterality Date  . NO PAST SURGERIES       reports that he has never smoked. He has never used smokeless tobacco. He reports that he does not drink alcohol or use drugs.  Allergies  Allergen Reactions  . Imdur [Isosorbide Dinitrate]     headaches  . Penicillins Other (See Comments)    Per pt unknown reaction    Family History  Problem Relation Age of Onset  . Hypertension Unknown   . Diabetes Mellitus II Unknown     Prior to Admission medications   Medication Sig Start Date  End Date Taking? Authorizing Provider  amLODipine (NORVASC) 10 MG tablet Take 1 tablet (10 mg total) by mouth daily. 09/18/17 11/26/24 Yes Marcine Matar, MD  aspirin 81 MG EC tablet Take 1 tablet (81 mg total) by mouth daily. 09/14/17  Yes Maxie Barb, MD  atorvastatin (LIPITOR) 80 MG tablet Take 0.5 tablets (40 mg total) by mouth daily at 6 PM. 09/18/17  Yes Marcine Matar, MD  carvedilol (COREG) 25 MG tablet Take 1 tablet (25 mg total) by mouth 2 (two) times daily with a meal. 09/18/17  Yes Marcine Matar, MD  hydrALAZINE (APRESOLINE) 25 MG tablet Take 3 tablets (75 mg total) by mouth every 8 (eight) hours. 09/18/17  Yes Marcine Matar, MD  irbesartan (AVAPRO) 150 MG tablet Take 1 tablet (150 mg total) by mouth daily. 09/18/17  Yes Marcine Matar, MD  metFORMIN (GLUCOPHAGE) 500 MG tablet Take 1 tablet (500 mg total) by mouth 2 (two) times daily with a meal. 09/18/17  Yes Marcine Matar, MD  sertraline (ZOLOFT) 50 MG tablet Take 1 tablet (50 mg total) by mouth daily. 09/18/17  Yes Marcine Matar, MD  sildenafil (VIAGRA) 100 MG tablet Take 1/2-1 tab PO 30 mins before intercourse PRN. Limit to 100 mg/24 hrs 06/07/17  Yes Marcine Matar, MD  spironolactone (ALDACTONE) 25 MG tablet Take 1 tablet (25 mg total) by mouth  daily. 09/18/17  Yes Marcine Matar, MD    Physical Exam: Vitals:   11/26/17 2230 11/26/17 2245 11/26/17 2300 11/26/17 2315  BP: 137/74 (!) 120/46 140/77 (!) 148/82  Pulse: 97 79 95 84  Resp: 14 19 19 15   Temp:      TempSrc:      SpO2: (!) 82% 93% 97% 97%  Weight:      Height:          Constitutional: Moderately built and nourished. Vitals:   11/26/17 2230 11/26/17 2245 11/26/17 2300 11/26/17 2315  BP: 137/74 (!) 120/46 140/77 (!) 148/82  Pulse: 97 79 95 84  Resp: 14 19 19 15   Temp:      TempSrc:      SpO2: (!) 82% 93% 97% 97%  Weight:      Height:       Eyes: Anicteric no pallor. ENMT: No discharge from the ears eyes nose or  mouth. Neck: No mass felt.  No neck rigidity.  No JVD appreciated. Respiratory: No rhonchi or crepitations. Cardiovascular: S1-S2 heard no murmurs appreciated. Abdomen: Soft nontender bowel sounds present. Musculoskeletal: No edema.  No joint effusion. Skin: No rash.  Skin appears warm. Neurologic: Alert awake oriented to time place and person.  Moves all extremities. Psychiatric: Appears normal.  Normal affect.   Labs on Admission: I have personally reviewed following labs and imaging studies  CBC: Recent Labs  Lab 11/26/17 1712  WBC 11.3*  HGB 14.7  HCT 42.7  MCV 69.4*  PLT 264   Basic Metabolic Panel: Recent Labs  Lab 11/26/17 1712  NA 121*  K 5.5*  CL 86*  CO2 16*  GLUCOSE 954*  BUN 27*  CREATININE 2.32*  CALCIUM 9.6   GFR: Estimated Creatinine Clearance: 78.4 mL/min (A) (by C-G formula based on SCr of 2.32 mg/dL (H)). Liver Function Tests: No results for input(s): AST, ALT, ALKPHOS, BILITOT, PROT, ALBUMIN in the last 168 hours. No results for input(s): LIPASE, AMYLASE in the last 168 hours. No results for input(s): AMMONIA in the last 168 hours. Coagulation Profile: No results for input(s): INR, PROTIME in the last 168 hours. Cardiac Enzymes: Recent Labs  Lab 11/26/17 2156  TROPONINI <0.03   BNP (last 3 results) No results for input(s): PROBNP in the last 8760 hours. HbA1C: No results for input(s): HGBA1C in the last 72 hours. CBG: Recent Labs  Lab 11/26/17 1622 11/26/17 1939 11/26/17 2049 11/26/17 2156 11/26/17 2300  GLUCAP >600* >600* >600* >600* 503*   Lipid Profile: No results for input(s): CHOL, HDL, LDLCALC, TRIG, CHOLHDL, LDLDIRECT in the last 72 hours. Thyroid Function Tests: No results for input(s): TSH, T4TOTAL, FREET4, T3FREE, THYROIDAB in the last 72 hours. Anemia Panel: No results for input(s): VITAMINB12, FOLATE, FERRITIN, TIBC, IRON, RETICCTPCT in the last 72 hours. Urine analysis:    Component Value Date/Time   COLORURINE  STRAW (A) 11/26/2017 1723   APPEARANCEUR CLEAR 11/26/2017 1723   LABSPEC 1.026 11/26/2017 1723   PHURINE 6.0 11/26/2017 1723   GLUCOSEU >=500 (A) 11/26/2017 1723   HGBUR NEGATIVE 11/26/2017 1723   BILIRUBINUR NEGATIVE 11/26/2017 1723   KETONESUR 20 (A) 11/26/2017 1723   PROTEINUR NEGATIVE 11/26/2017 1723   NITRITE NEGATIVE 11/26/2017 1723   LEUKOCYTESUR NEGATIVE 11/26/2017 1723   Sepsis Labs: @LABRCNTIP (procalcitonin:4,lacticidven:4) )No results found for this or any previous visit (from the past 240 hour(s)).   Radiological Exams on Admission: Dg Chest Port 1 View  Result Date: 11/26/2017 CLINICAL DATA:  Shortness of breath  starting today EXAM: PORTABLE CHEST 1 VIEW COMPARISON:  September 09, 2017 FINDINGS: The heart size and mediastinal contours are within normal limits. There is no focal infiltrate, pulmonary edema, or pleural effusion. The visualized skeletal structures are unremarkable. IMPRESSION: No active cardiopulmonary disease. Electronically Signed   By: Sherian Rein M.D.   On: 11/26/2017 21:48    EKG: Independently reviewed.  Normal sinus rhythm with nonspecific ST-T changes.  Assessment/Plan Principal Problem:   DKA, type 2 (HCC) Active Problems:   Morbid obesity, unspecified obesity type (HCC)   Sleep apnea   CHF (congestive heart failure) (HCC)   ARF (acute renal failure) (HCC)    1. Diabetic ketoacidosis with possible developing hyperosmolar status -we will check hemoglobin A1c.  Patient was recently drinking a lot of sugary sodas.  Patient is started on IV insulin infusion and has received 4 L of fluid bolus.  Closely follow metabolic panel for closure of anion gap and also improvement in blood sugar.  Patient probably will need long-acting insulin. 2. History of hypertension -we will hold ARB and spironolactone due to acute renal failure continue amlodipine Coreg and hydralazine.  Since I am holding off ARB I have placed patient on PRN IV hydralazine.  Closely  follow blood pressure trends. 3. Acute renal failure -likely from dehydration.  Holding ARB and spironolactone and continue with aggressive hydration. 4. History of sleep apnea on CPAP. 5. History of diastolic dysfunction last EF measured in January 2019 was 50-55%.  Holding spironolactone due to renal failure. 6. Shortness of breath likely from DKA.  But since d-dimer is mildly elevated will check VQ scan.   DVT prophylaxis: Lovenox. Code Status: Full code. Family Communication: Discussed with patient. Disposition Plan: Home. Consults called: None. Admission status: Inpatient.   Eduard Clos MD [facility] Pager [phone].  If 7PM-7AM, please contact night-coverage [email or web address] Password [facility]  11/26/2017, 11:47 PM

## 2017-11-26 NOTE — ED Triage Notes (Addendum)
Pt reports he has had dizziness and frequent urination X2 weeks. Pt states he checked his CBG at home and it was high, same reading here. Pt reports increased fatigue as well. Pt also states he has lost a significant amount of weight. No distress noted in triage, skin warm and dry.

## 2017-11-27 ENCOUNTER — Inpatient Hospital Stay (HOSPITAL_COMMUNITY): Payer: Medicaid Other

## 2017-11-27 ENCOUNTER — Encounter (HOSPITAL_COMMUNITY): Payer: Self-pay | Admitting: Emergency Medicine

## 2017-11-27 DIAGNOSIS — G4734 Idiopathic sleep related nonobstructive alveolar hypoventilation: Secondary | ICD-10-CM

## 2017-11-27 LAB — GLUCOSE, CAPILLARY
GLUCOSE-CAPILLARY: 234 mg/dL — AB (ref 65–99)
GLUCOSE-CAPILLARY: 205 mg/dL — AB (ref 65–99)
GLUCOSE-CAPILLARY: 171 mg/dL — AB (ref 65–99)
GLUCOSE-CAPILLARY: 165 mg/dL — AB (ref 65–99)
GLUCOSE-CAPILLARY: 232 mg/dL — AB (ref 65–99)
Glucose-Capillary: 237 mg/dL — ABNORMAL HIGH (ref 65–99)
Glucose-Capillary: 214 mg/dL — ABNORMAL HIGH (ref 65–99)
Glucose-Capillary: 174 mg/dL — ABNORMAL HIGH (ref 65–99)
Glucose-Capillary: 165 mg/dL — ABNORMAL HIGH (ref 65–99)
Glucose-Capillary: 171 mg/dL — ABNORMAL HIGH (ref 65–99)
Glucose-Capillary: 273 mg/dL — ABNORMAL HIGH (ref 65–99)

## 2017-11-27 LAB — TROPONIN I
Troponin I: <0.03 ng/mL (ref <0.03)
Troponin I: <0.03 ng/mL (ref <0.03)

## 2017-11-27 LAB — BASIC METABOLIC PANEL
Anion gap: 11 (ref 5–15)
Anion gap: 12 (ref 5–15)
Anion gap: 9 (ref 5–15)
Anion gap: 10 (ref 5–15)
BUN: 23 mg/dL — AB (ref 6–20)
BUN: 24 mg/dL — AB (ref 6–20)
BUN: 22 mg/dL — ABNORMAL HIGH (ref 6–20)
BUN: 21 mg/dL — AB (ref 6–20)
CHLORIDE: 102 mmol/L (ref 101–111)
CHLORIDE: 102 mmol/L (ref 101–111)
CHLORIDE: 103 mmol/L (ref 101–111)
CHLORIDE: 103 mmol/L (ref 101–111)
CO2: 21 mmol/L — ABNORMAL LOW (ref 22–32)
CO2: 21 mmol/L — ABNORMAL LOW (ref 22–32)
CO2: 22 mmol/L (ref 22–32)
CO2: 24 mmol/L (ref 22–32)
CREATININE: 1.49 mg/dL — AB (ref 0.61–1.24)
CREATININE: 1.49 mg/dL — AB (ref 0.61–1.24)
CREATININE: 1.33 mg/dL — AB (ref 0.61–1.24)
CREATININE: 1.28 mg/dL — AB (ref 0.61–1.24)
Calcium: 8.9 mg/dL (ref 8.9–10.3)
Calcium: 8.9 mg/dL (ref 8.9–10.3)
Calcium: 8.8 mg/dL — ABNORMAL LOW (ref 8.9–10.3)
Calcium: 9.1 mg/dL (ref 8.9–10.3)
GFR calc Af Amer: >60 mL/min (ref >60)
GFR calc Af Amer: >60 mL/min (ref >60)
GFR calc Af Amer: >60 mL/min (ref >60)
GFR calc Af Amer: >60 mL/min (ref >60)
GFR calc non Af Amer: >60 mL/min (ref >60)
GFR calc non Af Amer: >60 mL/min (ref >60)
GFR calc non Af Amer: >60 mL/min (ref >60)
GLUCOSE: 265 mg/dL — AB (ref 65–99)
GLUCOSE: 248 mg/dL — AB (ref 65–99)
Glucose, Bld: 225 mg/dL — ABNORMAL HIGH (ref 65–99)
Glucose, Bld: 146 mg/dL — ABNORMAL HIGH (ref 65–99)
POTASSIUM: 4.1 mmol/L (ref 3.5–5.1)
POTASSIUM: 3.9 mmol/L (ref 3.5–5.1)
Potassium: 4.3 mmol/L (ref 3.5–5.1)
Potassium: 4.4 mmol/L (ref 3.5–5.1)
SODIUM: 134 mmol/L — AB (ref 135–145)
SODIUM: 135 mmol/L (ref 135–145)
SODIUM: 134 mmol/L — AB (ref 135–145)
SODIUM: 137 mmol/L (ref 135–145)

## 2017-11-27 LAB — CBC
HEMATOCRIT: 39.6 % (ref 39.0–52.0)
Hemoglobin: 13.8 g/dL (ref 13.0–17.0)
MCH: 23.8 pg — ABNORMAL LOW (ref 26.0–34.0)
MCHC: 34.8 g/dL (ref 30.0–36.0)
MCV: 68.4 fL — AB (ref 78.0–100.0)
PLATELETS: 264 10*3/uL (ref 150–400)
RBC: 5.79 MIL/uL (ref 4.22–5.81)
RDW: 14.2 % (ref 11.5–15.5)
WBC: 11.5 10*3/uL — AB (ref 4.0–10.5)

## 2017-11-27 LAB — HEMOGLOBIN A1C
Hgb A1c MFr Bld: 12.2 % — ABNORMAL HIGH (ref 4.8–5.6)
MEAN PLASMA GLUCOSE: 303.44 mg/dL

## 2017-11-27 LAB — HIV ANTIBODY (ROUTINE TESTING W REFLEX): HIV SCREEN 4TH GENERATION: NONREACTIVE

## 2017-11-27 LAB — CBG MONITORING, ED
GLUCOSE-CAPILLARY: 265 mg/dL — AB (ref 65–99)
GLUCOSE-CAPILLARY: 250 mg/dL — AB (ref 65–99)
Glucose-Capillary: 251 mg/dL — ABNORMAL HIGH (ref 65–99)
Glucose-Capillary: 360 mg/dL — ABNORMAL HIGH (ref 65–99)
Glucose-Capillary: 427 mg/dL — ABNORMAL HIGH (ref 65–99)

## 2017-11-27 MED ORDER — INSULIN GLARGINE 100 UNIT/ML ~~LOC~~ SOLN
18.0000 [IU] | Freq: Once | SUBCUTANEOUS | Status: AC
  Administered 2017-11-27: 18 [IU] via SUBCUTANEOUS
  Filled 2017-11-27: qty 0.18

## 2017-11-27 MED ORDER — INSULIN ASPART 100 UNIT/ML ~~LOC~~ SOLN
0.0000 [IU] | Freq: Every day | SUBCUTANEOUS | Status: DC
  Administered 2017-11-27: 3 [IU] via SUBCUTANEOUS
  Administered 2017-11-28: 2 [IU] via SUBCUTANEOUS

## 2017-11-27 MED ORDER — TECHNETIUM TO 99M ALBUMIN AGGREGATED
4.2900 | Freq: Once | INTRAVENOUS | Status: AC | PRN
  Administered 2017-11-27: 4.29 via INTRAVENOUS

## 2017-11-27 MED ORDER — GLIPIZIDE 5 MG PO TABS: 5.0000 mg | ORAL_TABLET | Freq: Two times a day (BID) | ORAL | Status: DC

## 2017-11-27 MED ORDER — LACTATED RINGERS IV SOLN: INTRAVENOUS | Status: DC

## 2017-11-27 MED ORDER — SODIUM CHLORIDE 0.9 % IV SOLN
INTRAVENOUS | Status: AC
  Administered 2017-11-27: 15:00:00 via INTRAVENOUS

## 2017-11-27 MED ORDER — INSULIN GLARGINE 100 UNIT/ML ~~LOC~~ SOLN: 18.0000 [IU] | Freq: Every day | SUBCUTANEOUS | Status: DC

## 2017-11-27 MED ORDER — INSULIN ASPART 100 UNIT/ML ~~LOC~~ SOLN
0.0000 [IU] | Freq: Three times a day (TID) | SUBCUTANEOUS | Status: DC
Start: 2017-11-27 — End: 2017-11-29
  Administered 2017-11-27: 3 [IU] via SUBCUTANEOUS
  Administered 2017-11-28: 7 [IU] via SUBCUTANEOUS
  Administered 2017-11-28: 9 [IU] via SUBCUTANEOUS
  Administered 2017-11-28 – 2017-11-29 (×3): 3 [IU] via SUBCUTANEOUS

## 2017-11-27 MED ORDER — HYDRALAZINE HCL 20 MG/ML IJ SOLN: 10.0000 mg | INTRAMUSCULAR | Status: DC | PRN

## 2017-11-27 MED ORDER — METFORMIN HCL 500 MG PO TABS
1000.0000 mg | ORAL_TABLET | Freq: Two times a day (BID) | ORAL | Status: DC
  Administered 2017-11-27 – 2017-11-29 (×4): 1000 mg via ORAL
  Filled 2017-11-27 (×4): qty 2

## 2017-11-27 MED ORDER — ENOXAPARIN SODIUM 100 MG/ML ~~LOC~~ SOLN
90.0000 mg | SUBCUTANEOUS | Status: DC
  Administered 2017-11-28 – 2017-11-29 (×2): 90 mg via SUBCUTANEOUS
  Filled 2017-11-27 (×2): qty 1

## 2017-11-27 MED ORDER — TECHNETIUM TC 99M DIETHYLENETRIAME-PENTAACETIC ACID
32.1000 | Freq: Once | INTRAVENOUS | Status: AC | PRN
  Administered 2017-11-27: 32.1 via RESPIRATORY_TRACT

## 2017-11-27 NOTE — Progress Notes (Signed)
[facility] TEAM 1 - Stepdown/ICU TEAM  Naasir Newcomer Clardy  XBM:[id number] DOB: 09/21/1985 DOA: 11/26/2017 PCP: Marcine Matar, MD    Brief Narrative:  32yo male with a history of morbid obesity, sleep apnea, HTN, diastolic CHF, and DM2 who reported polydipsia, polyuria and weakness/fatigue building over 1 week.  In the ER he was found to have blood sugar >600 with creatinine of 2.3.  Anion gap was 19.  D-dimer was mildly elevated.  Significant Events: 4/7 admit in DKA  Subjective: Resting comfortably in bed.  Denies cp, sob, n/v, or abdom pain.  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    Assessment & Plan:  Uncontrolled DM2 w/ DKA A1c 12.2 - gap closed and bicarb normal therefore transitioned off insulin gtt - doubt he will be able to control his DM w/o insulin, but will try orals as pt will clearly not use insulin if prescribed - double glucophage and add glucotrol - counseled on need for healthy diet and major weight loss - DM coordinator has counseled on need to monitor CBG and on A1c  Acute kidney injury  Due to above - much improved - cont to hydrate - no ACE/ARB or IV contrast for now   Recent Labs  Lab 11/26/17 1712 11/27/17 0341 11/27/17 0525 11/27/17 0815 11/27/17 1206  CREATININE 2.32* 1.49* 1.49* 1.33* 1.28*     Chronic combined systolic and diastolic CHF EF 02-72% via TTE March 2018 - EF had improved 0t 50-55% via TTE Jan 2019 - no diuretic for now   HTN  Morbid obesity - Body mass index is 55.33 kg/m.   Sleep apnea  Cont CPAP   DVT prophylaxis: lovenox  Code Status: FULL CODE Family Communication: no family present at time of exam  Disposition Plan:   Consultants:  none  Antimicrobials:  None    Objective: Blood pressure (!) 134/111, pulse 65, temperature 98.7 F (37.1 C), temperature source Oral, resp. rate 18, height 6' (1.829 m), weight (!) 185.1 kg (408 lb), SpO2 96  %.  Intake/Output Summary (Last 24 hours) at 11/27/2017 1550 Last data filed at 11/27/2017 0048 Gross per 24 hour  Intake 4000 ml  Output 1100 ml  Net 2900 ml   Filed Weights   11/26/17 1913 11/27/17 0635  Weight: (!) 181 kg (399 lb) (!) 185.1 kg (408 lb)    Examination: General: No acute respiratory distress Lungs: Clear to auscultation bilaterally without wheezes or crackles Cardiovascular: Regular rate and rhythm without murmur gallop or rub normal S1 and S2 Abdomen: Nontender, nondistended, soft, bowel sounds positive, no rebound, no ascites, no appreciable mass Extremities: No significant cyanosis, clubbing, or edema bilateral lower extremities  CBC: Recent Labs  Lab 11/26/17 1712 11/27/17 0341  WBC 11.3* 11.5*  HGB 14.7 13.8  HCT 42.7 39.6  MCV 69.4* 68.4*  PLT 264 264   Basic Metabolic Panel: Recent Labs  Lab 11/27/17 0525 11/27/17 0815 11/27/17 1206  NA 135 134* 137  K 4.4 4.1 3.9  CL 102 103 103  CO2 21* 22 24  GLUCOSE 248* 225* 146*  BUN 24* 22* 21*  CREATININE 1.49* 1.33* 1.28*  CALCIUM 8.9 8.8* 9.1   GFR: Estimated Creatinine Clearance: 143.9 mL/min (A) (by C-G formula based on SCr of 1.28 mg/dL (H)).  Liver Function Tests: No results for input(s): AST, ALT, ALKPHOS, BILITOT, PROT, ALBUMIN in the last 168 hours. No  results for input(s): LIPASE, AMYLASE in the last 168 hours. No results for input(s): AMMONIA in the last 168 hours.  Cardiac Enzymes: Recent Labs  Lab 11/26/17 2156 11/27/17 0341 11/27/17 0525  TROPONINI <0.03 <0.03 <0.03    HbA1C: Hgb A1c MFr Bld  Date/Time Value Ref Range Status  11/27/2017 03:41 AM 12.2 (H) 4.8 - 5.6 % Final    Comment:    (NOTE) Pre diabetes:          5.7%-6.4% Diabetes:              >6.4% Glycemic control for   <7.0% adults with diabetes   09/11/2017 06:51 PM 6.9 (H) 4.8 - 5.6 % Final    Comment:    (NOTE) Pre diabetes:          5.7%-6.4% Diabetes:              >6.4% Glycemic control for    <7.0% adults with diabetes     CBG: Recent Labs  Lab 11/27/17 1036 11/27/17 1128 11/27/17 1151 11/27/17 1247 11/27/17 1408  GLUCAP 174* 171* 165* 165* 171*    Scheduled Meds: . amLODipine  10 mg Oral Daily  . aspirin EC  81 mg Oral Daily  . atorvastatin  40 mg Oral q1800  . carvedilol  25 mg Oral BID WC  . enoxaparin (LOVENOX) injection  90 mg Subcutaneous Q24H  . hydrALAZINE  75 mg Oral Q8H  . insulin aspart  0-5 Units Subcutaneous QHS  . insulin aspart  0-9 Units Subcutaneous TID WC  . [START ON 11/28/2017] insulin glargine  18 Units Subcutaneous Daily  . sertraline  50 mg Oral Daily     LOS: 1 day   Lonia Blood, MD [facility] Office  [phone] Pager - Text Page per Amion as per below:  On-Call/Text Page:      [email or web address]      password [email or web address]  If 7PM-7AM, please contact night-coverage [email or web address] Password [email or web address] 11/27/2017, 3:50 PM

## 2017-11-27 NOTE — Progress Notes (Signed)
Inpatient Diabetes Program Recommendations  AACE/ADA: New Consensus Statement on Inpatient Glycemic Control (2015)  Target Ranges:  Prepandial:   less than 140 mg/dL      Peak postprandial:   less than 180 mg/dL (1-2 hours)      Critically ill patients:  140 - 180 mg/dL   Review of Glycemic Control  Diabetes history: DM 2 Outpatient Diabetes medications: Metformin 500 mg BID  Inpatient Diabetes Program Recommendations:    Spoke with patient about diabetes and home regimen for diabetes control. Patient reports that he is followed by [facility] La[facility] and last saw her January when his A1c was 6.9%. Patient was first diagnosed with DM 2 about 1 year ago and has been on Metformin 500 mg BID since that time and takes it as prescribed. Patient reports not checking his glucose at all while on metformin.   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 Patient reports having a strong family history of DM. Discussed DM diet and portion sizes. Discussed physical activity during breaks and after stopping his truck at night.   Patient does not know what an A1c is. Discussed A1C results 12.2% this admission, discussed what an A1c is. Went over glucose and A1C goals. Discussed importance of checking CBGs and maintaining good CBG control to prevent long-term and short-term complications.  Discussed impact of nutrition, exercise, stress, sickness, and medications on diabetes control.   Encouraged patient to check glucose at least 2 times per day (fasting and alternating second check) and to keep a log book of glucose readings and insulin taken which will need to be taken to doctor appointments. Patient verbalized understanding of information discussed and he states that he has no further questions at this time related to diabetes.  Patient will not be able to work to drive his truck if placed on insulin. Patient says he has to start back to work in order to  get insurance. Patient will need insurance in order to max out the oral meds and injectables needed in order to avoid insulin.  Could double home metformin dose in addition to starting a sulfonylurea to see how well his glucose can be managed.  Thanks,  Christena Deem RN, MSN, [facility] Inpatient Diabetes Coordinator Team Pager [phone] (8a-5p)

## 2017-11-27 NOTE — Progress Notes (Signed)
Admitted from [facility] ED alert and oriented pain free,on insulin drip ,telemetry monitoring applied and CCMD notified.V/S checked,oriented to room,call bell within reach.Will continue to monitor.

## 2017-11-28 DIAGNOSIS — I5032 Chronic diastolic (congestive) heart failure: Secondary | ICD-10-CM

## 2017-11-28 DIAGNOSIS — Z91119 Patient's noncompliance with dietary regimen due to unspecified reason: Secondary | ICD-10-CM

## 2017-11-28 DIAGNOSIS — R739 Hyperglycemia, unspecified: Secondary | ICD-10-CM

## 2017-11-28 DIAGNOSIS — I5030 Unspecified diastolic (congestive) heart failure: Secondary | ICD-10-CM

## 2017-11-28 DIAGNOSIS — Z9111 Patient's noncompliance with dietary regimen: Secondary | ICD-10-CM

## 2017-11-28 DIAGNOSIS — G4733 Obstructive sleep apnea (adult) (pediatric): Secondary | ICD-10-CM

## 2017-11-28 LAB — BASIC METABOLIC PANEL
ANION GAP: 10 (ref 5–15)
Anion gap: 11 (ref 5–15)
BUN: 19 mg/dL (ref 6–20)
BUN: 18 mg/dL (ref 6–20)
CALCIUM: 8.8 mg/dL — AB (ref 8.9–10.3)
CO2: 19 mmol/L — AB (ref 22–32)
CO2: 21 mmol/L — ABNORMAL LOW (ref 22–32)
CREATININE: 1.47 mg/dL — AB (ref 0.61–1.24)
CREATININE: 1.22 mg/dL (ref 0.61–1.24)
Calcium: 8.8 mg/dL — ABNORMAL LOW (ref 8.9–10.3)
Chloride: 104 mmol/L (ref 101–111)
Chloride: 106 mmol/L (ref 101–111)
GFR calc Af Amer: >60 mL/min (ref >60)
GFR calc non Af Amer: >60 mL/min (ref >60)
GLUCOSE: 233 mg/dL — AB (ref 65–99)
Glucose, Bld: 399 mg/dL — ABNORMAL HIGH (ref 65–99)
Potassium: 4.5 mmol/L (ref 3.5–5.1)
Potassium: 3.6 mmol/L (ref 3.5–5.1)
SODIUM: 134 mmol/L — AB (ref 135–145)
Sodium: 137 mmol/L (ref 135–145)

## 2017-11-28 LAB — COMPREHENSIVE METABOLIC PANEL
ALK PHOS: 90 U/L (ref 38–126)
ALT: 18 U/L (ref 17–63)
AST: 16 U/L (ref 15–41)
Albumin: 3.2 g/dL — ABNORMAL LOW (ref 3.5–5.0)
Anion gap: 12 (ref 5–15)
BILIRUBIN TOTAL: 1 mg/dL (ref 0.3–1.2)
BUN: 20 mg/dL (ref 6–20)
CALCIUM: 8.7 mg/dL — AB (ref 8.9–10.3)
CO2: 19 mmol/L — AB (ref 22–32)
CREATININE: 1.52 mg/dL — AB (ref 0.61–1.24)
Chloride: 101 mmol/L (ref 101–111)
GFR calc non Af Amer: >60 mL/min (ref >60)
GLUCOSE: 422 mg/dL — AB (ref 65–99)
Potassium: 4.3 mmol/L (ref 3.5–5.1)
SODIUM: 132 mmol/L — AB (ref 135–145)
TOTAL PROTEIN: 6.1 g/dL — AB (ref 6.5–8.1)

## 2017-11-28 LAB — LACTIC ACID, PLASMA: LACTIC ACID, VENOUS: 1 mmol/L (ref 0.5–1.9)

## 2017-11-28 LAB — GLUCOSE, CAPILLARY
GLUCOSE-CAPILLARY: 234 mg/dL — AB (ref 65–99)
Glucose-Capillary: 382 mg/dL — ABNORMAL HIGH (ref 65–99)
Glucose-Capillary: 339 mg/dL — ABNORMAL HIGH (ref 65–99)
Glucose-Capillary: 237 mg/dL — ABNORMAL HIGH (ref 65–99)

## 2017-11-28 MED ORDER — LINAGLIPTIN 5 MG PO TABS
5.0000 mg | ORAL_TABLET | Freq: Every day | ORAL | Status: DC
  Administered 2017-11-28 – 2017-11-29 (×2): 5 mg via ORAL
  Filled 2017-11-28 (×2): qty 1

## 2017-11-28 MED ORDER — GLIPIZIDE 10 MG PO TABS
10.0000 mg | ORAL_TABLET | Freq: Two times a day (BID) | ORAL | Status: DC
  Administered 2017-11-28 – 2017-11-29 (×3): 10 mg via ORAL
  Filled 2017-11-28 (×3): qty 1

## 2017-11-28 MED ORDER — CANAGLIFLOZIN 100 MG PO TABS
100.0000 mg | ORAL_TABLET | Freq: Every day | ORAL | Status: DC
  Administered 2017-11-28: 100 mg via ORAL
  Filled 2017-11-28 (×2): qty 1

## 2017-11-28 MED ORDER — CANAGLIFLOZIN 100 MG PO TABS
100.0000 mg | ORAL_TABLET | Freq: Every day | ORAL | Status: DC
  Filled 2017-11-28: qty 1

## 2017-11-28 MED ORDER — SODIUM BICARBONATE 650 MG PO TABS: 650.0000 mg | ORAL_TABLET | Freq: Three times a day (TID) | ORAL | Status: DC

## 2017-11-28 MED ORDER — LINAGLIPTIN 5 MG PO TABS: 5.0000 mg | ORAL_TABLET | Freq: Every day | ORAL | Status: DC

## 2017-11-28 NOTE — Care Management Note (Signed)
Case Management Note Donn Pierini RN, BSN Unit 4E-Case Manager [phone]  Patient Details  Name: Paul Lamb MRN: [id number] Date of Birth: June 10, 1986  Subjective/Objective:   Pt admitted with DKA                 Action/Plan: PTA pt lived at home with family- independent- per conversation with pt he has PCP at [facility]- noted in epic pt has upcoming appointment for April 29 at 9:30 with [name]- pt also reports that he is in process of f/u with clinic regarding orange card for medication assistance encouraged pt to f/u with getting orange card. Pt does report that he uses [facility] for pharmacy needs- does not report any difficulty with getting medications through [facility]. CM to follow for and further transition of care needs.   Expected Discharge Date:                  Expected Discharge Plan:  Home/Self Care  In-House Referral:     Discharge planning Services  CM Consult, Medication Assistance  Post Acute Care Choice:    Choice offered to:     DME Arranged:    DME Agency:     HH Arranged:    HH Agency:     Status of Service:  In process, will continue to follow  If discussed at Long Length of Stay Meetings, dates discussed:    Discharge Disposition:   Additional Comments:  Darrold Span, RN 11/28/2017, 10:32 AM

## 2017-11-28 NOTE — Plan of Care (Addendum)
Nutrition Consult/Education Note  RD consulted for nutrition education regarding diabetes.   Lab Results  Component Value Date   HGBA1C 12.2 (H) 11/27/2017   RD provided "Carbohydrate Counting for People with Diabetes" handout from the Academy of Nutrition and Dietetics. Discussed different food groups and their effects on blood sugar, emphasizing carbohydrate-containing foods. Provided list of carbohydrates and recommended serving sizes of common foods.  Discussed importance of controlled and consistent carbohydrate intake throughout the day. Provided examples of ways to balance meals/snacks and encouraged intake of high-fiber, whole grain complex carbohydrates. Teach back method used.  Expect fair compliance. Her currently is a long distance truck driver.  Body mass index is 55.33 kg/m. Pt meets criteria for Obesity Class III based on current BMI.  Current diet order is Carbohydrate Modified, patient is consuming approximately 50% of meals at this time. Labs and medications reviewed. No further nutrition interventions warranted at this time. If additional nutrition issues arise, please re-consult RD.  Maureen Chatters, RD, LDN Pager #: [phone] After-Hours Pager #: [phone]

## 2017-11-28 NOTE — Progress Notes (Signed)
PROGRESS NOTE    Paul Lamb   ZOX:[id number]  DOB: [birth year]  DOA: 11/26/2017 PCP: Marcine Matar, MD   Brief Narrative:  Paul Lamb is a 32 y/o diabetic on Metformin who is noncompliant with his diet and presents with DKA. He is also mobidly obese, has HTN, dCHF, depression, OSA and is on Viagra. Per history, he was on vacation at [facility] and had a lot of soda  Subjective: No complaints. Wife states he drinks too much sugar even when he is NOT on vacation. Unfortunately, his family continues to eat candy and sugared food in the room around him today.    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 - started on Metformin 1000 mg BID and Glipizide 5 mg BID yesterday - today>> increase Glipizide to 10 mg BID and add Trajendta and Invokana - case manager has confirmed for me that he will be able to get Januvia (equivilent to Melissa) and Invokana at [facility] - have repeatedly discussed cutting back on carbs - dietician consulted  - as he is becoming acidotic again, I will repeat Bmet and check a Lactic acid now- cannot go home  Active Problems:   ARF (acute renal failure) - Cr 2.32 on admission- now 1.4 which is his baseline- GFR > 60 now    Chronic diastolic CHF (congestive heart failure)    - cont Norvasc, Coreg, hydralazine - Ibesartan and Aldactone on hold- will resume- watch output on Invokana - on Viagra and not on nitrates    Morbid obesity, unspecified obesity type  Body mass index is 55.33 kg/m. - dietician consulted    Sleep apnea - CPAP  Depression - Zoloft  Dyslipidemia - Lipitor  DVT prophylaxis: Lovenox Code Status: Full  code Family Communication: wife at bedside Disposition Plan: home when no longer acidotic Consultants:   none Procedures:   none Antimicrobials:  Anti-infectives (From admission, onward)   None       Objective: Vitals:   11/27/17 2034 11/28/17 0525 11/28/17 1119 11/28/17 1253  BP: 126/81 136/79 126/82   Pulse: 68 72    Resp: (!) 22 18    Temp: 98.9 F (37.2 C) 98.7 F (37.1 C)  98.5 F (36.9 C)  TempSrc: Oral Oral    SpO2: 92% 92%    Weight:      Height:        Intake/Output Summary (Last 24 hours) at 11/28/2017 1627 Last data filed at 11/28/2017 1300 Gross per 24 hour  Intake 1207.67 ml  Output -  Net 1207.67 ml   Filed Weights   11/26/17 1913 11/27/17 0635  Weight: (!) 181 kg (399 lb) (!) 185.1 kg (408 lb)    Examination: General exam: Appears comfortable  HEENT: PERRLA, oral mucosa moist, no sclera icterus or thrush Respiratory system: faint crackles at bases- Respiratory effort normal.- pulse ox 92% on room air Cardiovascular system: S1 & S2 heard, RRR.   Gastrointestinal system: Abdomen soft, non-tender, nondistended. Normal bowel sound. obese Central nervous system: Alert and oriented. No focal neurological deficits. Extremities: No cyanosis, clubbing or edema Skin: No rashes or ulcers Psychiatry:  Flat affect    Data Reviewed:  I have personally reviewed following labs and imaging studies  CBC: Recent Labs  Lab 11/26/17 1712 11/27/17 0341  WBC 11.3* 11.5*  HGB 14.7 13.8  HCT 42.7 39.6  MCV 69.4* 68.4*  PLT 264 264   Basic Metabolic Panel: Recent Labs  Lab 11/27/17 0525 11/27/17 0815 11/27/17 1206 11/28/17 0417 11/28/17 0753  NA 135 134* 137 132* 134*  K 4.4 4.1 3.9 4.3 4.5  CL 102 103 103 101 104  CO2 21* 22 24 19* 19*  GLUCOSE 248* 225* 146* 422* 399*  BUN 24* 22* 21* 20 19  CREATININE 1.49* 1.33* 1.28* 1.52* 1.47*  CALCIUM 8.9 8.8* 9.1 8.7* 8.8*   GFR: Estimated Creatinine Clearance: 125.3 mL/min (A) (by C-G formula based on  SCr of 1.47 mg/dL (H)). Liver Function Tests: Recent Labs  Lab 11/28/17 0417  AST 16  ALT 18  ALKPHOS 90  BILITOT 1.0  PROT 6.1*  ALBUMIN 3.2*   No results for input(s): LIPASE, AMYLASE in the last 168 hours. No results for input(s): AMMONIA in the last 168 hours. Coagulation Profile: No results for input(s): INR, PROTIME in the last 168 hours. Cardiac Enzymes: Recent Labs  Lab 11/26/17 2156 11/27/17 0341 11/27/17 0525  TROPONINI <0.03 <0.03 <0.03   BNP (last 3 results) No results for input(s): PROBNP in the last 8760 hours. HbA1C: Recent Labs    11/27/17 0341  HGBA1C 12.2*   CBG: Recent Labs  Lab 11/27/17 1742 11/27/17 2105 11/28/17 0607 11/28/17 1115 11/28/17 1606  GLUCAP 232* 273* 382* 339* 234*   Lipid Profile: No results for input(s): CHOL, HDL, LDLCALC, TRIG, CHOLHDL, LDLDIRECT in the last 72 hours. Thyroid Function Tests: No results for input(s): TSH, T4TOTAL, FREET4, T3FREE, THYROIDAB in the last 72 hours. Anemia Panel: No results for input(s): VITAMINB12, FOLATE, FERRITIN, TIBC, IRON, RETICCTPCT in the last 72 hours. Urine analysis:    Component Value Date/Time   COLORURINE STRAW (A) 11/26/2017 1723   APPEARANCEUR CLEAR 11/26/2017 1723   LABSPEC 1.026 11/26/2017 1723   PHURINE 6.0 11/26/2017 1723   GLUCOSEU >=500 (A) 11/26/2017 1723   HGBUR NEGATIVE 11/26/2017 1723   BILIRUBINUR NEGATIVE 11/26/2017 1723   KETONESUR 20 (A) 11/26/2017 1723   PROTEINUR NEGATIVE 11/26/2017 1723   NITRITE NEGATIVE 11/26/2017 1723   LEUKOCYTESUR NEGATIVE 11/26/2017 1723   Sepsis Labs: @LABRCNTIP (procalcitonin:4,lacticidven:4) )No results found for this or any previous visit (from the past 240 hour(s)).       Radiology Studies: Nm Pulmonary Perf And Vent  Result Date: 11/27/2017 CLINICAL DATA:  Evaluate for acute pulmonary embolus. EXAM: NUCLEAR MEDICINE VENTILATION - PERFUSION LUNG SCAN TECHNIQUE: Ventilation images were obtained in multiple projections  using inhaled aerosol Tc-91m DTPA. Perfusion images were obtained in multiple projections after intravenous injection of Tc-55m-MAA. RADIOPHARMACEUTICALS:  32.1 mCi of Tc-86m DTPA aerosol inhalation and 4.29 mCi Tc79m-MAA IV COMPARISON:  Chest radiograph 11/26/2017 FINDINGS: Ventilation: No focal ventilation defect. Perfusion: No wedge shaped peripheral perfusion defects to suggest acute pulmonary embolism. IMPRESSION: 1. Normal exam.  No evidence for acute pulmonary embolus. Electronically Signed   By: Signa Kell M.D.   On: 11/27/2017 17:28   Dg Chest Port 1 View  Result Date: 11/26/2017 CLINICAL DATA:  Shortness of breath starting today EXAM: PORTABLE CHEST 1 VIEW COMPARISON:  September 09, 2017 FINDINGS: The heart size and mediastinal contours are within normal limits. There is no focal infiltrate, pulmonary edema, or pleural effusion. The visualized skeletal structures are unremarkable. IMPRESSION: No active cardiopulmonary disease. Electronically Signed  By: Sherian Rein M.D.   On: 11/26/2017 21:48      Scheduled Meds: . amLODipine  10 mg Oral Daily  . aspirin EC  81 mg Oral Daily  . atorvastatin  40 mg Oral q1800  . canagliflozin  100 mg Oral QAC breakfast  . carvedilol  25 mg Oral BID WC  . enoxaparin (LOVENOX) injection  90 mg Subcutaneous Q24H  . glipiZIDE  10 mg Oral BID AC  . hydrALAZINE  75 mg Oral Q8H  . insulin aspart  0-5 Units Subcutaneous QHS  . insulin aspart  0-9 Units Subcutaneous TID WC  . linagliptin  5 mg Oral Daily  . metFORMIN  1,000 mg Oral BID WC  . sertraline  50 mg Oral Daily   Continuous Infusions:   LOS: 2 days    Time spent in minutes: 45     Calvert Cantor, MD [facility] Pager: [email or web address] Password [facility] 11/28/2017, 4:27 PM

## 2017-11-29 DIAGNOSIS — E1110 Type 2 diabetes mellitus with ketoacidosis without coma: Principal | ICD-10-CM

## 2017-11-29 LAB — GLUCOSE, CAPILLARY
Glucose-Capillary: 228 mg/dL — ABNORMAL HIGH (ref 65–99)
Glucose-Capillary: 206 mg/dL — ABNORMAL HIGH (ref 65–99)

## 2017-11-29 LAB — BASIC METABOLIC PANEL
Anion gap: 11 (ref 5–15)
BUN: 15 mg/dL (ref 6–20)
CHLORIDE: 104 mmol/L (ref 101–111)
CO2: 21 mmol/L — ABNORMAL LOW (ref 22–32)
Calcium: 8.8 mg/dL — ABNORMAL LOW (ref 8.9–10.3)
Creatinine, Ser: 1.21 mg/dL (ref 0.61–1.24)
GFR calc Af Amer: >60 mL/min (ref >60)
GFR calc non Af Amer: >60 mL/min (ref >60)
GLUCOSE: 222 mg/dL — AB (ref 65–99)
POTASSIUM: 3.6 mmol/L (ref 3.5–5.1)
Sodium: 136 mmol/L (ref 135–145)

## 2017-11-29 MED ORDER — GLIPIZIDE 10 MG PO TABS: 10.0000 mg | ORAL_TABLET | Freq: Two times a day (BID) | ORAL | 0 refills | Status: DC

## 2017-11-29 MED ORDER — METFORMIN HCL 1000 MG PO TABS
1000.0000 mg | ORAL_TABLET | Freq: Two times a day (BID) | ORAL | 0 refills | Status: DC
Start: 2017-11-29 — End: 2017-12-12

## 2017-11-29 NOTE — Care Management Note (Signed)
Case Management Note Donn Pierini RN, BSN Unit 4E-Case Manager [phone]  Patient Details  Name: Paul Lamb MRN: [id number] Date of Birth: 1985-09-11  Subjective/Objective:   Pt admitted with DKA                 Action/Plan: PTA pt lived at home with family- independent- per conversation with pt he has PCP at [facility]- noted in epic pt has upcoming appointment for April 29 at 9:30 with [name]- pt also reports that he is in process of f/u with clinic regarding orange card for medication assistance encouraged pt to f/u with getting orange card. Pt does report that he uses [facility] for pharmacy needs- does not report any difficulty with getting medications through [facility]. CM to follow for and further transition of care needs.   Expected Discharge Date:  11/29/17               Expected Discharge Plan:  Home/Self Care  In-House Referral:  NA  Discharge planning Services  CM Consult, Medication Assistance  Post Acute Care Choice:  NA Choice offered to:  NA  DME Arranged:    DME Agency:     HH Arranged:    HH Agency:     Status of Service:  Completed, signed off  If discussed at [employer] of Stay Meetings, dates discussed:    Discharge Disposition: home/self care   Additional Comments:  11/29/17- 1415- Donn Pierini RN, CM- pt for d/c today- pt can go to [facility] for medication needs- no further CM needs noted for transition to home.   11/28/17- 1530- Donn Pierini RN, CM- received call from MD regarding medication for discharge- call made to [facility] to check and see which drugs would be available at their pharmacy for pt-  Pt would be able to get Januvia and Invokana at the [facility] pharmacy where they could further assist pt with medication assistance. MD notified.   Darrold Span, RN 11/29/2017, 2:14 PM

## 2017-11-29 NOTE — Discharge Summary (Signed)
Physician Discharge Summary  Paul Lamb RWP:[id number] DOB: 1986-01-27 DOA: 11/26/2017  PCP: Marcine Matar, MD  Admit date: 11/26/2017 Discharge date: 11/29/2017  Time spent:  35 minutes  Recommendations for Outpatient Follow-up:  1. Patient will be discharged on hypoglycemic agents listed below. Please reassess based on blood sugars and decide whether or not to add another agent 2. Encourage adherence to diabetic diet   Discharge Diagnoses:  Principal Problem:   Diabetic ketoacidosis without coma associated with type 2 diabetes mellitus (HCC) Active Problems:   Morbid obesity, unspecified obesity type (HCC)   Sleep apnea   ARF (acute renal failure) (HCC)   Dietary noncompliance   Chronic diastolic CHF (congestive heart failure) (HCC)   Discharge Condition: stable  Diet recommendation: diabetic diet  Filed Weights   11/26/17 1913 11/27/17 0635  Weight: (!) 181 kg (399 lb) (!) 185.1 kg (408 lb)    History of present illness:  32 year old diabetic who presented on CK after being noncompliant with diabetic diet.  Hospital Course:  DKA - per my discussion with diabetic coordinator patient's last hemoglobin around 6 in January. I suspect patient was probably non-adherent to diet. Will discharge on hypoglycemic agents listed below. Defer to primary care physician whether or not to add another agent  Otherwise for known medical conditions prior to hospitalization will continue prior to hospitalization medication regimen.  Procedures:  none  Consultations:  none  Discharge Exam: Vitals:   11/28/17 2032 11/29/17 0432  BP: 124/77 (!) 140/96  Pulse: 76 80  Resp: 12 17  Temp: 98.4 F (36.9 C) 98 F (36.7 C)  SpO2: 95% 92%    General: Pt in nad, alert and awake Cardiovascular: rrr, no rubs Respiratory: no increased wob, no wheezes  Discharge Instructions   Discharge Instructions    Call MD for:  severe uncontrolled pain   Complete by:  As directed    Call MD for:  temperature >100.4   Complete by:  As directed    Diet - low sodium heart healthy   Complete by:  As directed    Discharge instructions   Complete by:  As directed    Please adhere to a diabetic diet. Follow up with your primary care physician Within the next one or 2 weeks or sooner should any new concerns arise.   Increase activity slowly   Complete by:  As directed      Allergies as of 11/29/2017      Reactions   Imdur [isosorbide Dinitrate]    headaches   Penicillins Other (See Comments)   Per pt unknown reaction      Medication List    TAKE these medications   amLODipine 10 MG tablet Commonly known as:  NORVASC Take 1 tablet (10 mg total) by mouth daily.   aspirin 81 MG EC tablet Take 1 tablet (81 mg total) by mouth daily.   atorvastatin 80 MG tablet Commonly known as:  LIPITOR Take 0.5 tablets (40 mg total) by mouth daily at 6 PM.   carvedilol 25 MG tablet Commonly known as:  COREG Take 1 tablet (25 mg total) by mouth 2 (two) times daily with a meal.   glipiZIDE 10 MG tablet Commonly known as:  GLUCOTROL Take 1 tablet (10 mg total) by mouth 2 (two) times daily before a meal.   hydrALAZINE 25 MG tablet Commonly known as:  APRESOLINE Take 3 tablets (75 mg total) by mouth every 8 (eight) hours.   irbesartan 150 MG tablet Commonly  known as:  AVAPRO Take 1 tablet (150 mg total) by mouth daily.   metFORMIN 1000 MG tablet Commonly known as:  GLUCOPHAGE Take 1 tablet (1,000 mg total) by mouth 2 (two) times daily with a meal. What changed:    medication strength  how much to take   sertraline 50 MG tablet Commonly known as:  ZOLOFT Take 1 tablet (50 mg total) by mouth daily.   sildenafil 100 MG tablet Commonly known as:  VIAGRA Take 1/2-1 tab PO 30 mins before intercourse PRN. Limit to 100 mg/24 hrs   spironolactone 25 MG tablet Commonly known as:  ALDACTONE Take 1 tablet (25 mg total) by mouth daily.      Allergies  Allergen  Reactions  . Imdur [Isosorbide Dinitrate]     headaches  . Penicillins Other (See Comments)    Per pt unknown reaction   Follow-up Information    [facility]. Go on 12/18/2017.   Why:  appointment with [name]- at 9:30- please keep this appointment Contact information: [address] [place] [address] [phone]           The results of significant diagnostics from this hospitalization (including imaging, microbiology, ancillary and laboratory) are listed below for reference.    Significant Diagnostic Studies: Nm Pulmonary Perf And Vent  Result Date: 11/27/2017 CLINICAL DATA:  Evaluate for acute pulmonary embolus. EXAM: NUCLEAR MEDICINE VENTILATION - PERFUSION LUNG SCAN TECHNIQUE: Ventilation images were obtained in multiple projections using inhaled aerosol Tc-35m DTPA. Perfusion images were obtained in multiple projections after intravenous injection of Tc-44m-MAA. RADIOPHARMACEUTICALS:  32.1 mCi of Tc-7m DTPA aerosol inhalation and 4.29 mCi Tc52m-MAA IV COMPARISON:  Chest radiograph 11/26/2017 FINDINGS: Ventilation: No focal ventilation defect. Perfusion: No wedge shaped peripheral perfusion defects to suggest acute pulmonary embolism. IMPRESSION: 1. Normal exam.  No evidence for acute pulmonary embolus. Electronically Signed   By: Signa Kell M.D.   On: 11/27/2017 17:28   Dg Chest Port 1 View  Result Date: 11/26/2017 CLINICAL DATA:  Shortness of breath starting today EXAM: PORTABLE CHEST 1 VIEW COMPARISON:  September 09, 2017 FINDINGS: The heart size and mediastinal contours are within normal limits. There is no focal infiltrate, pulmonary edema, or pleural effusion. The visualized skeletal structures are unremarkable. IMPRESSION: No active cardiopulmonary disease. Electronically Signed   By: Sherian Rein M.D.   On: 11/26/2017 21:48    Microbiology: No results found for this or any previous visit (from the past 240  hour(s)).   Labs: Basic Metabolic Panel: Recent Labs  Lab 11/27/17 1206 11/28/17 0417 11/28/17 0753 11/28/17 1637 11/29/17 0306  NA 137 132* 134* 137 136  K 3.9 4.3 4.5 3.6 3.6  CL 103 101 104 106 104  CO2 24 19* 19* 21* 21*  GLUCOSE 146* 422* 399* 233* 222*  BUN 21* 20 19 18 15   CREATININE 1.28* 1.52* 1.47* 1.22 1.21  CALCIUM 9.1 8.7* 8.8* 8.8* 8.8*   Liver Function Tests: Recent Labs  Lab 11/28/17 0417  AST 16  ALT 18  ALKPHOS 90  BILITOT 1.0  PROT 6.1*  ALBUMIN 3.2*   No results for input(s): LIPASE, AMYLASE in the last 168 hours. No results for input(s): AMMONIA in the last 168 hours. CBC: Recent Labs  Lab 11/26/17 1712 11/27/17 0341  WBC 11.3* 11.5*  HGB 14.7 13.8  HCT 42.7 39.6  MCV 69.4* 68.4*  PLT 264 264   Cardiac Enzymes: Recent Labs  Lab 11/26/17 2156 11/27/17 0341  11/27/17 0525  TROPONINI <0.03 <0.03 <0.03   BNP: BNP (last 3 results) Recent Labs    09/09/17 1547  BNP 137.2*    ProBNP (last 3 results) No results for input(s): PROBNP in the last 8760 hours.  CBG: Recent Labs  Lab 11/28/17 1115 11/28/17 1606 11/28/17 2122 11/29/17 0616 11/29/17 1204  GLUCAP 339* 234* 237* 228* 206*   Signed:  Penny Pia MD.  [facility] 11/29/2017, 2:02 PM

## 2017-11-29 NOTE — Progress Notes (Signed)
Briefly spoke with patient by phone regarding DM management.  Reminded him to check blood sugars at least 2-3 times a day and to call MD if blood sugars continue to be consistently>200 mg/dL.  Explained that he is being discharged on oral medications for DM only, however he needs close monitoring to make sure that blood sugars do not increase.  Patient verbalized understanding.  Briefly discussed eliminating sugar from beverages and increasing movement/excercise to help with glucose management.  Patient verbalized understanding.   Thanks,  Beryl Meager, RN, BC-ADM Inpatient Diabetes Coordinator Pager [phone] (8a-5p)

## 2017-11-29 NOTE — Progress Notes (Signed)
Inpatient Diabetes Program Recommendations  AACE/ADA: New Consensus Statement on Inpatient Glycemic Control (2015)  Target Ranges:  Prepandial:   less than 140 mg/dL      Peak postprandial:   less than 180 mg/dL (1-2 hours)      Critically ill patients:  140 - 180 mg/dL   Lab Results  Component Value Date   GLUCAP 228 (H) 11/29/2017   HGBA1C 12.2 (H) 11/27/2017    Review of Glycemic ControlResults for KORDAE, BENSMAN (MRN [id number]) as of 11/29/2017 10:59  Ref. 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 Plan is to send patient home on oral agents instead.  I would not recommend d/c on SGLT-2 such as Invokana due to history of acidosis.  Called and discussed with MD.  Of note, patient's A1C has increased from 6.9% to 12.2% in the past 3-4 months?  Patient may be a candidate for a GLP-1 such as Trulicity once a week if this is available at [facility] pharmacy? He has appointment on 12/18/17.   Thanks,  Beryl Meager, RN, BC-ADM Inpatient Diabetes Coordinator Pager [phone]

## 2017-12-07 ENCOUNTER — Telehealth: Payer: Self-pay | Admitting: Internal Medicine

## 2017-12-07 NOTE — Telephone Encounter (Signed)
Please schedule him a appointment sooner than the appointment he has with pcp   Will need to see him for hospital f/u and in order to consult with the changes

## 2017-12-07 NOTE — Telephone Encounter (Signed)
Patient called and requested to speak with pcp/nurse regarding a recent visit at [facility]. Patient wanted to inform pcp of changes made and wanted to consult with her regarding changes and some other questions. Please fu at your earliest convenience.

## 2017-12-12 ENCOUNTER — Telehealth: Payer: Self-pay | Admitting: Internal Medicine

## 2017-12-12 ENCOUNTER — Ambulatory Visit: Payer: Medicaid Other | Admitting: Licensed Clinical Social Worker

## 2017-12-12 ENCOUNTER — Encounter: Payer: Self-pay | Admitting: Internal Medicine

## 2017-12-12 ENCOUNTER — Ambulatory Visit: Payer: Medicaid Other | Attending: Internal Medicine | Admitting: Internal Medicine

## 2017-12-12 VITALS — BP 158/104 | HR 81 | Temp 98.7°F | Resp 16 | Wt >= 6400 oz

## 2017-12-12 DIAGNOSIS — F32A Depression, unspecified: Secondary | ICD-10-CM

## 2017-12-12 DIAGNOSIS — Z79899 Other long term (current) drug therapy: Secondary | ICD-10-CM | POA: Diagnosis not present

## 2017-12-12 DIAGNOSIS — R519 Headache, unspecified: Secondary | ICD-10-CM

## 2017-12-12 DIAGNOSIS — E785 Hyperlipidemia, unspecified: Secondary | ICD-10-CM | POA: Diagnosis not present

## 2017-12-12 DIAGNOSIS — I5032 Chronic diastolic (congestive) heart failure: Secondary | ICD-10-CM | POA: Insufficient documentation

## 2017-12-12 DIAGNOSIS — I110 Hypertensive heart disease with heart failure: Secondary | ICD-10-CM | POA: Insufficient documentation

## 2017-12-12 DIAGNOSIS — E1165 Type 2 diabetes mellitus with hyperglycemia: Secondary | ICD-10-CM

## 2017-12-12 DIAGNOSIS — Z794 Long term (current) use of insulin: Secondary | ICD-10-CM | POA: Diagnosis not present

## 2017-12-12 DIAGNOSIS — Z880 Allergy status to penicillin: Secondary | ICD-10-CM | POA: Diagnosis not present

## 2017-12-12 DIAGNOSIS — R45851 Suicidal ideations: Secondary | ICD-10-CM | POA: Diagnosis not present

## 2017-12-12 DIAGNOSIS — Z9111 Patient's noncompliance with dietary regimen: Secondary | ICD-10-CM | POA: Diagnosis not present

## 2017-12-12 DIAGNOSIS — I10 Essential (primary) hypertension: Secondary | ICD-10-CM

## 2017-12-12 DIAGNOSIS — R51 Headache: Secondary | ICD-10-CM | POA: Diagnosis not present

## 2017-12-12 DIAGNOSIS — F329 Major depressive disorder, single episode, unspecified: Secondary | ICD-10-CM | POA: Diagnosis not present

## 2017-12-12 DIAGNOSIS — Z7982 Long term (current) use of aspirin: Secondary | ICD-10-CM | POA: Diagnosis not present

## 2017-12-12 DIAGNOSIS — Z6841 Body Mass Index (BMI) 40.0 and over, adult: Secondary | ICD-10-CM | POA: Diagnosis not present

## 2017-12-12 DIAGNOSIS — F322 Major depressive disorder, single episode, severe without psychotic features: Secondary | ICD-10-CM

## 2017-12-12 LAB — GLUCOSE, POCT (MANUAL RESULT ENTRY): POC GLUCOSE: 175 mg/dL — AB (ref 70–99)

## 2017-12-12 LAB — POCT GLYCOSYLATED HEMOGLOBIN (HGB A1C): Hemoglobin A1C: 12.1

## 2017-12-12 MED ORDER — GLIPIZIDE 10 MG PO TABS: 10.0000 mg | ORAL_TABLET | Freq: Two times a day (BID) | ORAL | 6 refills | Status: DC

## 2017-12-12 MED ORDER — SERTRALINE HCL 100 MG PO TABS: 100.0000 mg | ORAL_TABLET | Freq: Every day | ORAL | 6 refills | Status: DC

## 2017-12-12 MED ORDER — INSULIN GLARGINE 100 UNIT/ML SOLOSTAR PEN: 10.0000 [IU] | PEN_INJECTOR | Freq: Every day | SUBCUTANEOUS | 99 refills | Status: DC

## 2017-12-12 MED ORDER — ASPIRIN 81 MG PO TBEC: 81.0000 mg | DELAYED_RELEASE_TABLET | Freq: Every day | ORAL | 1 refills | Status: AC

## 2017-12-12 MED ORDER — AMLODIPINE BESYLATE 10 MG PO TABS: 10.0000 mg | ORAL_TABLET | Freq: Every day | ORAL | 3 refills | Status: DC

## 2017-12-12 MED ORDER — CARVEDILOL 25 MG PO TABS: 25.0000 mg | ORAL_TABLET | Freq: Two times a day (BID) | ORAL | 3 refills | Status: DC

## 2017-12-12 MED ORDER — ATORVASTATIN CALCIUM 80 MG PO TABS: 40.0000 mg | ORAL_TABLET | Freq: Every day | ORAL | 2 refills | Status: DC

## 2017-12-12 MED ORDER — SPIRONOLACTONE 25 MG PO TABS: 25.0000 mg | ORAL_TABLET | Freq: Every day | ORAL | 3 refills | Status: DC

## 2017-12-12 MED ORDER — INSULIN PEN NEEDLE 31G X 8 MM MISC: 4 refills | Status: DC

## 2017-12-12 MED ORDER — HYDRALAZINE HCL 100 MG PO TABS: 100.0000 mg | ORAL_TABLET | Freq: Three times a day (TID) | ORAL | 6 refills | Status: DC

## 2017-12-12 MED ORDER — METFORMIN HCL 1000 MG PO TABS: 1000.0000 mg | ORAL_TABLET | Freq: Two times a day (BID) | ORAL | 5 refills | Status: DC

## 2017-12-12 MED ORDER — IRBESARTAN 150 MG PO TABS: 150.0000 mg | ORAL_TABLET | Freq: Every day | ORAL | 3 refills | Status: DC

## 2017-12-12 MED FILL — ATORVASTATIN 80 MG TABLET: 80 | 30 days supply | Qty: 15 | Fill #0

## 2017-12-12 MED FILL — AMLODIPINE BESYLATE 10 MG T: 10 | 30 days supply | Qty: 30 | Fill #1

## 2017-12-12 MED FILL — IRBESARTAN 150 MG TABLET: 150 | 30 days supply | Qty: 30 | Fill #1

## 2017-12-12 MED FILL — SPIRONOLACTONE 25 MG TABS: 25 | 30 days supply | Qty: 30 | Fill #1

## 2017-12-12 MED FILL — CARVEDILOL 25 MG TABLET: 25 | 30 days supply | Qty: 60 | Fill #1

## 2017-12-12 MED FILL — $VIAGRA 100 MG TABLET: 100 | 30 days supply | Qty: 10 | Fill #1

## 2017-12-12 MED FILL — metFORMIN HCL 1000 MG TABS: 1000 | 30 days supply | Qty: 60 | Fill #0

## 2017-12-12 MED FILL — glipiZIDE 10 MG TABS: 10 | 30 days supply | Qty: 60 | Fill #0

## 2017-12-12 MED FILL — hydrALAZINE HCL 100 MG TABS: 100 | 30 days supply | Qty: 90 | Fill #0

## 2017-12-12 MED FILL — !LANTUS SOLOSTAR 100UNITS/M: 100 | 30 days supply | Qty: 3 | Fill #0

## 2017-12-12 MED FILL — SERTRALINE HCL 100 MG TAB: 100 | 30 days supply | Qty: 30 | Fill #0

## 2017-12-12 NOTE — BH Specialist Note (Signed)
Integrated Behavioral Health Initial Visit  MRN: [id number] Name: Paul [facility]  Number of Integrated Behavioral Health Clinician visits:: 1/6 Session Start time: 11:15 AM  Session End time: 11:45 AM Total time: 30 minutes  Type of Service: Integrated Behavioral Health- Individual/Family Interpretor:No. Interpretor Name and Language: N/A   Warm Hand Off Completed.       SUBJECTIVE: Paul Lamb is a 31 y.o. male accompanied by three minor children and Spouse Patient was referred by Dr. Laural Benes for depression and anxiety. Patient reports the following symptoms/concerns: overwhelming feelings of sadness and worry, feeling bad about self, low motivation/energy, and hx of suicidal ideations Duration of problem: Ongoing; Severity of problem: severe  OBJECTIVE: Mood: Depressed and Affect: Depressed and flat Risk of harm to self or others: Suicidal ideation No plan to harm self or others  LIFE CONTEXT: Family and Social: Pt receives support from spouse School/Work: Pt is employed as a full time [employer] architect; however, may need to take some time off due to health conditions.  Self-Care: Pt talks with spouse to cope with stressors. He participates in medication management Life Changes: Pt is grieving the loss of his mother (March 2019) He has ongoing medical conditions that pt has difficulty managing   GOALS ADDRESSED: Patient will: 1. Reduce symptoms of: anxiety and depression 2. Increase knowledge and/or ability of: coping skills and healthy habits  3. Demonstrate ability to: Increase healthy adjustment to current life circumstances, Increase adequate support systems for patient/family and Begin healthy grieving over loss  INTERVENTIONS: Interventions utilized: Solution-Focused Strategies, Supportive Counseling, Psychoeducation and/or Health Education and Link to [facility]  Standardized Assessments completed: GAD-7 and PHQ 2&9 with C-SSRS  ASSESSMENT: Patient  currently experiencing depression and anxiety triggered by ongoing medical conditions and the recent loss of his mother. He reports overwhelming feelings of sadness and worry, feeling bad about self, low motivation/energy, and hx of suicidal ideations. Pt denies current SI/HI/AVH. Safety Plan was discussed and crisis intervention resources were provided. Pt receives strong support from spouse, who was present during visit.   Patient may benefit from psychoeducation and psychotherapy. He participates in medication management through PCP. Therapeutic interventions to decrease symptoms and encourage positive emotions were discussed. Pt agreeable to think about initiating grief counseling and was provided grief supportive services for the family. Family was referred to [facility] for free exercise classes and One [employer] to assist with food insecurity.   PLAN: 1. Follow up with behavioral health clinician on : Pt was encouraged to contact [facility] if symptoms worsen or fail to improve to schedule behavioral appointments at [facility]. 2. Behavioral recommendations: LCSWA recommends that pt apply healthy coping skills discussed, comply with medication management, utilize provided resources, and initiate psychotherapy. Pt is encouraged to schedule follow up appointment with [facility]WA 3. Referral(s): Integrated Art gallery manager (In Clinic), Community Mental Health Services (LME/Outside Clinic) and Community Resources:  Food 4. "From scale of 1-10, how likely are you to follow plan?":   Paul Larsson, LCSW 12/14/17 5:10 PM

## 2017-12-12 NOTE — Progress Notes (Signed)
Patient ID: Paul Lamb, male    DOB: May 07, 1986  MRN: [id number]  CC: Hospitalization Follow-up   Subjective: Paul Lamb is a 32 y.o. male who presents for hosp f/u.  His wife and 3 small children are with him. His concerns today include:  Patient with history of HTN, diastolic CHF with EF of 61-44%, HTN, OSA on CPAP, HL, morbid obesity, diabetes, depression. Patient hospitalized 4/7-05/2018 with diabetic ketoacidosis.  Patient discharged on metformin and glipizide. 1.  DM -checking BS 3-4 x a day.  Range 160-275.   -did not get to meet with dietician while hospitalized.Marland Kitchen He has stopped eating fried foods, trying to avoid bread and potatoes.  Drinking mainly water. -exercise: not much -last eye exam was 09/2016.   2.  HTN:  Compliant with medications and salt restriction.  Lower extremity edema fluctuates. Wears compression socks Complains of constant Rt side throbbing HA x 4 days.  No N/V/dizziness/photophobia. + blurred vision.   Taking Ibuprofen once a day Use to have HA around age 24-13 yrs.  Different  3.  Depression: under a lot of stress.  Loss mother last mth. Admits to having suicidal ideation a couple of wks ago. Not now.  Reports compliance with Zoloft.  He is not sure whether an increased dose will help.  He is noncommittal about seeing a therapist.  He has already met with our licensed clinical social worker today.    Patient Active Problem List   Diagnosis Date Noted  . Dietary noncompliance 11/28/2017  . Chronic diastolic CHF (congestive heart failure) ([place]) 11/28/2017  . ARF (acute renal failure) ([place]) 11/26/2017  . Diabetic ketoacidosis without coma associated with type 2 diabetes mellitus ([place])   . Diabetes mellitus, new onset ([place])   . Proteinuria   . Elevated troponin   . Chest pressure   . Erectile dysfunction 05/19/2017  . Depression 05/19/2017  . Sleep apnea 01/12/2016  . Cardiomegaly - hypertensive   . Abnormal EKG   . Hypertensive  emergency 11/01/2015  . Hand pain, left 11/01/2015  . Morbid obesity, unspecified obesity type ([place]) 11/01/2015     Current Outpatient Medications on File Prior to Visit  Medication Sig Dispense Refill  . amLODipine (NORVASC) 10 MG tablet Take 1 tablet (10 mg total) by mouth daily. 90 tablet 3  . aspirin 81 MG EC tablet Take 1 tablet (81 mg total) by mouth daily. 30 tablet 0  . atorvastatin (LIPITOR) 80 MG tablet Take 0.5 tablets (40 mg total) by mouth daily at 6 PM. 90 tablet 2  . carvedilol (COREG) 25 MG tablet Take 1 tablet (25 mg total) by mouth 2 (two) times daily with a meal. 120 tablet 3  . glipiZIDE (GLUCOTROL) 10 MG tablet Take 1 tablet (10 mg total) by mouth 2 (two) times daily before a meal. 60 tablet 0  . hydrALAZINE (APRESOLINE) 25 MG tablet Take 3 tablets (75 mg total) by mouth every 8 (eight) hours. 270 tablet 6  . irbesartan (AVAPRO) 150 MG tablet Take 1 tablet (150 mg total) by mouth daily. 90 tablet 3  . metFORMIN (GLUCOPHAGE) 1000 MG tablet Take 1 tablet (1,000 mg total) by mouth 2 (two) times daily with a meal. 60 tablet 0  . sertraline (ZOLOFT) 50 MG tablet Take 1 tablet (50 mg total) by mouth daily. 30 tablet 5  . sildenafil (VIAGRA) 100 MG tablet Take 1/2-1 tab PO 30 mins before intercourse PRN. Limit to 100 mg/24 hrs 30 tablet 3  .  spironolactone (ALDACTONE) 25 MG tablet Take 1 tablet (25 mg total) by mouth daily. 90 tablet 3   No current facility-administered medications on file prior to visit.     Allergies  Allergen Reactions  . Imdur [Isosorbide Dinitrate]     headaches  . Penicillins Other (See Comments)    Per pt unknown reaction    Social History   Socioeconomic History  . Marital status: Married    Spouse name: Not on file  . Number of children: Not on file  . Years of education: Not on file  . Highest education level: Not on file  Occupational History  . Not on file  Social Needs  . Financial resource strain: Not on file  . Food insecurity:      Worry: Not on file    Inability: Not on file  . Transportation needs:    Medical: Not on file    Non-medical: Not on file  Tobacco Use  . Smoking status: Never Smoker  . Smokeless tobacco: Never Used  Substance and Sexual Activity  . Alcohol use: No    Alcohol/week: 0.0 oz  . Drug use: No  . Sexual activity: Yes  Lifestyle  . Physical activity:    Days per week: 0 days    Minutes per session: 0 min  . Stress: To some extent  Relationships  . Social connections:    Talks on phone: Patient refused    Gets together: Patient refused    Attends religious service: Patient refused    Active member of club or organization: Patient refused    Attends meetings of clubs or organizations: Patient refused    Relationship status: Patient refused  . Intimate partner violence:    Fear of current or ex partner: No    Emotionally abused: No    Physically abused: No    Forced sexual activity: No  Other Topics Concern  . Not on file  Social History Narrative  . Not on file    Family History  Problem Relation Age of Onset  . Hypertension Unknown   . Diabetes Mellitus II Unknown     Past Surgical History:  Procedure Laterality Date  . NO PAST SURGERIES      ROS: Review of Systems Negative except as stated above PHYSICAL EXAM: BP (!) 158/104   Pulse 81   Temp 98.7 F (37.1 C) (Oral)   Resp 16   Wt (!) 411 lb (186.4 kg)   SpO2 95%   BMI 55.74 kg/m   Wt Readings from Last 3 Encounters:  12/12/17 (!) 411 lb (186.4 kg)  11/27/17 (!) 408 lb (185.1 kg)  09/18/17 (!) 433 lb 9.6 oz (196.7 kg)   Repeat blood pressure 154/99 Physical Exam   General appearance - alert, well appearing, morbidly obese male and in no distress Mental status - flat affect Eyes - pupils equal and reactive, extraocular eye movements intact Neck - supple, no significant adenopathy Chest - clear to auscultation, no wheezes, rales or rhonchi, symmetric air entry Heart - normal rate, regular rhythm,  normal S1, S2, no murmurs, rubs, clicks or gallops Neurological - cranial nerves II through XII intact, motor and sensory grossly normal bilaterally. Reflexes in UEs and LEs nl Extremities - peripheral pulses normal, no pedal edema, no clubbing or cyanosis  BS 175/ A1C 12.1 ASSESSMENT AND PLAN: 1. Uncontrolled type 2 diabetes mellitus with hyperglycemia (HCC) Continue metformin and glipizide.  Add Lantus 10 units daily.  Commended him on making changes  in his eating habits.  Encourage him to try to increase activity level. - POCT glucose (manual entry) - POCT glycosylated hemoglobin (Hb A1C) - Insulin Glargine (LANTUS SOLOSTAR) 100 UNIT/ML Solostar Pen; Inject 10 Units into the skin daily at 10 pm.  Dispense: 5 pen; Refill: PRN  2. Essential hypertension Not at goal.  Increase hydralazine to 100 mg 3 times a day.  Refill given on other medications. - hydrALAZINE (APRESOLINE) 100 MG tablet; Take 1 tablet (100 mg total) by mouth every 8 (eight) hours.  Dispense: 90 tablet; Refill: 6 - amLODipine (NORVASC) 10 MG tablet; Take 1 tablet (10 mg total) by mouth daily.  Dispense: 90 tablet; Refill: 3 - irbesartan (AVAPRO) 150 MG tablet; Take 1 tablet (150 mg total) by mouth daily.  Dispense: 90 tablet; Refill: 3 - carvedilol (COREG) 25 MG tablet; Take 1 tablet (25 mg total) by mouth 2 (two) times daily with a meal.  Dispense: 120 tablet; Refill: 3 - spironolactone (ALDACTONE) 25 MG tablet; Take 1 tablet (25 mg total) by mouth daily.  Dispense: 90 tablet; Refill: 3  3. Depression, unspecified depression type Recommended increase Zoloft to 100 mg daily.  Encouraged patient to consider establishing with a behavioral health provider in the community for counseling - sertraline (ZOLOFT) 100 MG tablet; Take 1 tablet (100 mg total) by mouth daily.  Dispense: 30 tablet; Refill: 6  4. Morbid obesity, unspecified obesity type ([place]) See #1 above  5. Unilateral headache Likely migraine but could also be due  to blood pressure.  Neurologic exam nonfocal.  Hydralazine increased.  Stop ibuprofen as it can increase blood pressure.  Okay to take Tylenol.  6. Hyperlipidemia, unspecified hyperlipidemia type - atorvastatin (LIPITOR) 80 MG tablet; Take 0.5 tablets (40 mg total) by mouth daily at 6 PM.  Dispense: 90 tablet; Refill: 2   At the end of the visit patient asked about getting a note for work.  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  Because of his health he is wanting to discontinue doing that.  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  Letter written.  I will bring him back in 1 month to recheck his blood pressure and diabetes. Patient was given the opportunity to ask questions.  Patient verbalized understanding of the plan and was able to repeat key elements of the plan.   No orders of the defined types were placed in this encounter.    Requested Prescriptions    No prescriptions requested or ordered in this encounter    No follow-ups on file.  Karle Plumber, MD, FACP

## 2017-12-12 NOTE — Telephone Encounter (Signed)
Patient stopped by the front desk and requested for letter to state the time he would need off and his job requested for 3-4 months. Patient stated he spoke with his company and they stated this is what they would need. Please fu at your earliest convenience.

## 2017-12-12 NOTE — Progress Notes (Signed)
Pt states he has been having a headache for 3-4 days now  Pt states he has blurry vision, light sensitivity and noise bother him

## 2017-12-12 NOTE — Telephone Encounter (Signed)
Will forward to pcp

## 2017-12-12 NOTE — Patient Instructions (Signed)
Your diabetes is well controlled.  Continue metformin and glipizide.  Start Lantus insulin 10 units daily as discussed.  Continue to work on your eating habits.  I have enclosed some information below on healthy eating habits for diabetics.  Your blood pressure is not well controlled.  This can sometimes cause headaches.  We have increase hydralazine to 100 mg 3 times a day. Stop ibuprofen.  Okay to use Tylenol as needed.  Increase Zoloft 100 mg a day.  Consider getting some counseling to help with the depression.  Follow a Healthy Eating Plan - You can do it! Limit sugary drinks.  Avoid sodas, sweet tea, sport or energy drinks, or fruit drinks.  Drink water, lo-fat milk, or diet drinks. Limit snack foods.   Cut back on candy, cake, cookies, chips, ice cream.  These are a special treat, only in small amounts. Eat plenty of vegetables.  Especially dark green, red, and orange vegetables. Aim for at least 3 servings a day. More is better! Include fruit in your daily diet.  Whole fruit is much healthier than fruit juice! Limit "white" bread, "white" pasta, "white" rice.   Choose "100% whole grain" products, brown or wild rice. Avoid fatty meats. Try "Meatless Monday" and choose eggs or beans one day a week.  When eating meat, choose lean meats like chicken, [place], and fish.  Grill, broil, or bake meats instead of frying, and eat poultry without the skin. Eat less salt.  Avoid frozen pizzas, frozen dinners and salty foods.  Use seasonings other than salt in cooking.  This can help blood pressure and keep you from swelling Beer, wine and liquor have calories.  If you can safely drink alcohol, limit to 1 drink per day for women, 2 drinks for men

## 2017-12-13 NOTE — Telephone Encounter (Signed)
Contacted pt and informed him of Dr. Laural Benes response

## 2017-12-18 ENCOUNTER — Ambulatory Visit: Payer: Self-pay | Admitting: Internal Medicine

## 2017-12-28 ENCOUNTER — Encounter: Payer: Self-pay | Admitting: Internal Medicine

## 2017-12-29 ENCOUNTER — Emergency Department (HOSPITAL_COMMUNITY): Payer: Medicaid Other

## 2017-12-29 ENCOUNTER — Encounter (HOSPITAL_COMMUNITY): Payer: Self-pay | Admitting: *Deleted

## 2017-12-29 ENCOUNTER — Inpatient Hospital Stay (HOSPITAL_COMMUNITY)
Admission: EM | Admit: 2017-12-29 | Discharge: 2017-12-31 | DRG: 313 | Disposition: A | Discharge status: Home / Self Care | Payer: Medicaid Other | Attending: Internal Medicine | Admitting: Internal Medicine

## 2017-12-29 ENCOUNTER — Other Ambulatory Visit: Payer: Self-pay

## 2017-12-29 DIAGNOSIS — I200 Unstable angina: Secondary | ICD-10-CM

## 2017-12-29 DIAGNOSIS — E785 Hyperlipidemia, unspecified: Secondary | ICD-10-CM | POA: Diagnosis present

## 2017-12-29 DIAGNOSIS — N189 Chronic kidney disease, unspecified: Secondary | ICD-10-CM | POA: Diagnosis present

## 2017-12-29 DIAGNOSIS — G44209 Tension-type headache, unspecified, not intractable: Secondary | ICD-10-CM | POA: Diagnosis present

## 2017-12-29 DIAGNOSIS — I130 Hypertensive heart and chronic kidney disease with heart failure and stage 1 through stage 4 chronic kidney disease, or unspecified chronic kidney disease: Secondary | ICD-10-CM | POA: Diagnosis present

## 2017-12-29 DIAGNOSIS — Z794 Long term (current) use of insulin: Secondary | ICD-10-CM

## 2017-12-29 DIAGNOSIS — Z9114 Patient's other noncompliance with medication regimen: Secondary | ICD-10-CM

## 2017-12-29 DIAGNOSIS — E1142 Type 2 diabetes mellitus with diabetic polyneuropathy: Secondary | ICD-10-CM | POA: Diagnosis present

## 2017-12-29 DIAGNOSIS — R079 Chest pain, unspecified: Secondary | ICD-10-CM

## 2017-12-29 DIAGNOSIS — Z7982 Long term (current) use of aspirin: Secondary | ICD-10-CM

## 2017-12-29 DIAGNOSIS — E1165 Type 2 diabetes mellitus with hyperglycemia: Secondary | ICD-10-CM | POA: Diagnosis present

## 2017-12-29 DIAGNOSIS — I5032 Chronic diastolic (congestive) heart failure: Secondary | ICD-10-CM

## 2017-12-29 DIAGNOSIS — E1122 Type 2 diabetes mellitus with diabetic chronic kidney disease: Secondary | ICD-10-CM | POA: Diagnosis present

## 2017-12-29 DIAGNOSIS — R0902 Hypoxemia: Secondary | ICD-10-CM | POA: Diagnosis present

## 2017-12-29 DIAGNOSIS — I110 Hypertensive heart disease with heart failure: Secondary | ICD-10-CM | POA: Diagnosis present

## 2017-12-29 DIAGNOSIS — E1169 Type 2 diabetes mellitus with other specified complication: Secondary | ICD-10-CM | POA: Diagnosis present

## 2017-12-29 DIAGNOSIS — E669 Obesity, unspecified: Secondary | ICD-10-CM | POA: Diagnosis present

## 2017-12-29 DIAGNOSIS — Z8673 Personal history of transient ischemic attack (TIA), and cerebral infarction without residual deficits: Secondary | ICD-10-CM

## 2017-12-29 DIAGNOSIS — I5030 Unspecified diastolic (congestive) heart failure: Secondary | ICD-10-CM | POA: Diagnosis present

## 2017-12-29 DIAGNOSIS — Z6841 Body Mass Index (BMI) 40.0 and over, adult: Secondary | ICD-10-CM

## 2017-12-29 DIAGNOSIS — R0789 Other chest pain: Principal | ICD-10-CM | POA: Diagnosis present

## 2017-12-29 DIAGNOSIS — G4733 Obstructive sleep apnea (adult) (pediatric): Secondary | ICD-10-CM | POA: Diagnosis present

## 2017-12-29 DIAGNOSIS — G4730 Sleep apnea, unspecified: Secondary | ICD-10-CM | POA: Diagnosis present

## 2017-12-29 LAB — BASIC METABOLIC PANEL
Anion gap: 10 (ref 5–15)
BUN: 16 mg/dL (ref 6–20)
CALCIUM: 9.6 mg/dL (ref 8.9–10.3)
CHLORIDE: 104 mmol/L (ref 101–111)
CO2: 25 mmol/L (ref 22–32)
CREATININE: 1.26 mg/dL — AB (ref 0.61–1.24)
GFR calc Af Amer: >60 mL/min (ref >60)
GFR calc non Af Amer: >60 mL/min (ref >60)
Glucose, Bld: 142 mg/dL — ABNORMAL HIGH (ref 65–99)
Potassium: 4.1 mmol/L (ref 3.5–5.1)
SODIUM: 139 mmol/L (ref 135–145)

## 2017-12-29 LAB — CBC
HCT: 38.2 % — ABNORMAL LOW (ref 39.0–52.0)
Hemoglobin: 13.3 g/dL (ref 13.0–17.0)
MCH: 24.6 pg — ABNORMAL LOW (ref 26.0–34.0)
MCHC: 34.8 g/dL (ref 30.0–36.0)
MCV: 70.6 fL — AB (ref 78.0–100.0)
PLATELETS: 259 10*3/uL (ref 150–400)
RBC: 5.41 MIL/uL (ref 4.22–5.81)
RDW: 15 % (ref 11.5–15.5)
WBC: 8 10*3/uL (ref 4.0–10.5)

## 2017-12-29 LAB — I-STAT TROPONIN, ED: Troponin i, poc: 0 ng/mL (ref 0.00–0.08)

## 2017-12-29 MED ORDER — ASPIRIN EC 325 MG PO TBEC
325.0000 mg | DELAYED_RELEASE_TABLET | Freq: Every day | ORAL | Status: DC
  Administered 2017-12-30 – 2017-12-31 (×2): 325 mg via ORAL
  Filled 2017-12-29 (×2): qty 1

## 2017-12-29 MED ORDER — HYDRALAZINE HCL 25 MG PO TABS
100.0000 mg | ORAL_TABLET | Freq: Three times a day (TID) | ORAL | Status: DC
  Administered 2017-12-30 – 2017-12-31 (×4): 100 mg via ORAL
  Filled 2017-12-29 (×4): qty 4

## 2017-12-29 MED ORDER — ACETAMINOPHEN 500 MG PO TABS
1000.0000 mg | ORAL_TABLET | Freq: Once | ORAL | Status: AC
  Administered 2017-12-29: 1000 mg via ORAL
  Filled 2017-12-29: qty 2

## 2017-12-29 MED ORDER — HYDRALAZINE HCL 20 MG/ML IJ SOLN
10.0000 mg | INTRAMUSCULAR | Status: DC | PRN
  Filled 2017-12-29: qty 1

## 2017-12-29 MED ORDER — AMLODIPINE BESYLATE 10 MG PO TABS
10.0000 mg | ORAL_TABLET | Freq: Every day | ORAL | Status: DC
  Administered 2017-12-30 – 2017-12-31 (×2): 10 mg via ORAL
  Filled 2017-12-29 (×2): qty 1

## 2017-12-29 MED ORDER — ATORVASTATIN CALCIUM 40 MG PO TABS
40.0000 mg | ORAL_TABLET | Freq: Every day | ORAL | Status: DC
  Administered 2017-12-30: 40 mg via ORAL
  Filled 2017-12-29: qty 1

## 2017-12-29 MED ORDER — ONDANSETRON HCL 4 MG/2ML IJ SOLN: 4.0000 mg | Freq: Four times a day (QID) | INTRAMUSCULAR | Status: DC | PRN

## 2017-12-29 MED ORDER — NITROGLYCERIN 0.4 MG SL SUBL
0.4000 mg | SUBLINGUAL_TABLET | SUBLINGUAL | Status: DC | PRN
  Administered 2017-12-29: 0.4 mg via SUBLINGUAL
  Filled 2017-12-29 (×2): qty 1

## 2017-12-29 MED ORDER — INSULIN GLARGINE 100 UNIT/ML ~~LOC~~ SOLN
10.0000 [IU] | Freq: Every day | SUBCUTANEOUS | Status: DC
  Administered 2017-12-30: 10 [IU] via SUBCUTANEOUS
  Filled 2017-12-29 (×2): qty 0.1

## 2017-12-29 MED ORDER — IRBESARTAN 150 MG PO TABS
150.0000 mg | ORAL_TABLET | Freq: Every day | ORAL | Status: DC
  Administered 2017-12-30 – 2017-12-31 (×2): 150 mg via ORAL
  Filled 2017-12-29 (×2): qty 1

## 2017-12-29 MED ORDER — SPIRONOLACTONE 25 MG PO TABS
25.0000 mg | ORAL_TABLET | Freq: Every day | ORAL | Status: DC
  Administered 2017-12-30 – 2017-12-31 (×2): 25 mg via ORAL
  Filled 2017-12-29 (×2): qty 1

## 2017-12-29 MED ORDER — ENOXAPARIN SODIUM 100 MG/ML ~~LOC~~ SOLN
90.0000 mg | Freq: Every day | SUBCUTANEOUS | Status: DC
  Administered 2017-12-30: 90 mg via SUBCUTANEOUS
  Filled 2017-12-29 (×3): qty 1

## 2017-12-29 MED ORDER — CARVEDILOL 25 MG PO TABS
25.0000 mg | ORAL_TABLET | Freq: Two times a day (BID) | ORAL | Status: DC
  Administered 2017-12-30 – 2017-12-31 (×3): 25 mg via ORAL
  Filled 2017-12-29 (×3): qty 1

## 2017-12-29 MED ORDER — ASPIRIN 81 MG PO CHEW
324.0000 mg | CHEWABLE_TABLET | Freq: Once | ORAL | Status: AC
  Administered 2017-12-29: 324 mg via ORAL
  Filled 2017-12-29: qty 4

## 2017-12-29 MED ORDER — ACETAMINOPHEN 325 MG PO TABS
650.0000 mg | ORAL_TABLET | ORAL | Status: DC | PRN
  Administered 2017-12-30 – 2017-12-31 (×2): 650 mg via ORAL
  Filled 2017-12-29 (×2): qty 2

## 2017-12-29 MED ORDER — SERTRALINE HCL 100 MG PO TABS
100.0000 mg | ORAL_TABLET | Freq: Every day | ORAL | Status: DC
  Administered 2017-12-30 – 2017-12-31 (×2): 100 mg via ORAL
  Filled 2017-12-29 (×2): qty 1

## 2017-12-29 MED ORDER — INSULIN ASPART 100 UNIT/ML ~~LOC~~ SOLN: 0.0000 [IU] | Freq: Three times a day (TID) | SUBCUTANEOUS | Status: DC

## 2017-12-29 NOTE — ED Notes (Signed)
IV attempts x2 unsuccessful.  

## 2017-12-29 NOTE — ED Notes (Signed)
Pt c/o increased chest pain. EDP notified.

## 2017-12-29 NOTE — ED Notes (Signed)
ED Provider at bedside. 

## 2017-12-29 NOTE — ED Provider Notes (Signed)
[facility] EMERGENCY DEPARTMENT Provider Note  CSN: [id number] Arrival date & time: 12/29/17 1056  Chief Complaint(s) Headache and Chest Injury  HPI Paul Lamb is a 32 y.o. male   The history is provided by the patient.  Headache   This is a new problem. Episode onset: several weeks. The problem occurs constantly. Progression since onset: flcutuating. The headache is associated with an unknown factor. Pain location: migratory. The quality of the pain is described as sharp. The pain is moderate. Associated symptoms include shortness of breath, nausea and vomiting (NBNB; once several nights ago). Pertinent negatives include no anorexia, no fever, no malaise/fatigue, no near-syncope and no syncope. He has tried acetaminophen (Seen by PCP 2 weeks ago and told to take Tylenol) for the symptoms. The treatment provided mild relief.  Chest Pain   This is a recurrent problem. Episode onset: 3 days. Episode frequency: intermittent. Progression since onset: intermittent; current having pain. The pain is associated with exertion. The pain is present in the substernal region. The pain is moderate. The quality of the pain is described as exertional and pressure-like. The pain does not radiate. The symptoms are aggravated by exertion. Associated symptoms include diaphoresis, headaches, lower extremity edema (improving), nausea, shortness of breath and vomiting (NBNB; once several nights ago). Pertinent negatives include no abdominal pain, no back pain, no cough, no fever, no malaise/fatigue, no near-syncope and no syncope. He has tried rest for the symptoms. Risk factors include male gender and obesity.  His past medical history is significant for CHF, diabetes, hyperlipidemia, hypertension and strokes (reported).  Pertinent negatives for past medical history include no MI and no PE.    Past Medical History Past Medical History:  Diagnosis Date  . Acute combined systolic and diastolic  CHF, NYHA class 3 (HCC) 11/18/2016   EF now 40-45% by echo  . Acute diastolic (congestive) heart failure (HCC) 01/12/2016  . Diabetes mellitus, new onset (HCC)   . Hypertension   . Morbid obesity (HCC)   . OSA (obstructive sleep apnea)    Patient Active Problem List   Diagnosis Date Noted  . Dietary noncompliance 11/28/2017  . Chronic diastolic CHF (congestive heart failure) (HCC) 11/28/2017  . ARF (acute renal failure) (HCC) 11/26/2017  . Diabetic ketoacidosis without coma associated with type 2 diabetes mellitus (HCC)   . Diabetes mellitus, new onset (HCC)   . Proteinuria   . Elevated troponin   . Chest pressure   . Erectile dysfunction 05/19/2017  . Depression 05/19/2017  . Sleep apnea 01/12/2016  . Cardiomegaly - hypertensive   . Abnormal EKG   . Hypertensive emergency 11/01/2015  . Hand pain, left 11/01/2015  . Morbid obesity, unspecified obesity type (HCC) 11/01/2015   Home Medication(s) Prior to Admission medications   Medication Sig Start Date End Date Taking? Authorizing Provider  amLODipine (NORVASC) 10 MG tablet Take 1 tablet (10 mg total) by mouth daily. 12/12/17  Yes Marcine Matar, MD  aspirin 81 MG EC tablet Take 1 tablet (81 mg total) by mouth daily. 12/12/17  Yes Marcine Matar, MD  atorvastatin (LIPITOR) 80 MG tablet Take 0.5 tablets (40 mg total) by mouth daily at 6 PM. 12/12/17  Yes Marcine Matar, MD  carvedilol (COREG) 25 MG tablet Take 1 tablet (25 mg total) by mouth 2 (two) times daily with a meal. 12/12/17  Yes Marcine Matar, MD  glipiZIDE (GLUCOTROL) 10 MG tablet Take 1 tablet (10 mg total) by mouth 2 (two) times  daily before a meal. 12/12/17  Yes Marcine Matar, MD  hydrALAZINE (APRESOLINE) 100 MG tablet Take 1 tablet (100 mg total) by mouth every 8 (eight) hours. 12/12/17  Yes Marcine Matar, MD  Insulin Glargine (LANTUS SOLOSTAR) 100 UNIT/ML Solostar Pen Inject 10 Units into the skin daily at 10 pm. Patient taking differently: Inject  10 Units into the skin at bedtime.  12/12/17  Yes Marcine Matar, MD  irbesartan (AVAPRO) 150 MG tablet Take 1 tablet (150 mg total) by mouth daily. 12/12/17  Yes Marcine Matar, MD  metFORMIN (GLUCOPHAGE) 1000 MG tablet Take 1 tablet (1,000 mg total) by mouth 2 (two) times daily with a meal. 12/12/17  Yes Marcine Matar, MD  sertraline (ZOLOFT) 100 MG tablet Take 1 tablet (100 mg total) by mouth daily. 12/12/17  Yes Marcine Matar, MD  sildenafil (VIAGRA) 100 MG tablet Take 1/2-1 tab PO 30 mins before intercourse PRN. Limit to 100 mg/24 hrs 06/07/17  Yes Marcine Matar, MD  spironolactone (ALDACTONE) 25 MG tablet Take 1 tablet (25 mg total) by mouth daily. 12/12/17  Yes Marcine Matar, MD  Insulin Pen Needle 31G X 8 MM MISC Use as directed. 12/12/17   Marcine Matar, MD                                                                                                                                    Past Surgical History Past Surgical History:  Procedure Laterality Date  . NO PAST SURGERIES     Family History Family History  Problem Relation Age of Onset  . Hypertension Unknown   . Diabetes Mellitus II Unknown     Social History Social History   Tobacco Use  . Smoking status: Never Smoker  . Smokeless tobacco: Never Used  Substance Use Topics  . Alcohol use: Yes    Alcohol/week: 0.0 oz  . Drug use: No   Allergies Imdur [isosorbide dinitrate] and Penicillins  Review of Systems Review of Systems  Constitutional: Positive for diaphoresis. Negative for fever and malaise/fatigue.  Respiratory: Positive for shortness of breath. Negative for cough.   Cardiovascular: Positive for chest pain. Negative for syncope and near-syncope.  Gastrointestinal: Positive for nausea and vomiting (NBNB; once several nights ago). Negative for abdominal pain and anorexia.  Musculoskeletal: Negative for back pain.  Neurological: Positive for headaches.   All other systems are  reviewed and are negative for acute change except as noted in the HPI  Physical Exam Vital Signs  I have reviewed the triage vital signs BP (!) 153/96 (BP Location: Right Arm)   Pulse 64   Temp 98.2 F (36.8 C) (Oral)   Resp 18   Ht  (1.88 m)   Wt (!) 185.1 kg (408 lb)   SpO2 98%   BMI 52.38 kg/m   Physical Exam  Constitutional: He is oriented to person, place, and time.  He appears well-developed and well-nourished. No distress.  Obesity   HENT:  Head: Normocephalic and atraumatic.  Nose: Nose normal.  Eyes: Pupils are equal, round, and reactive to light. Conjunctivae and EOM are normal. Right eye exhibits no discharge. Left eye exhibits no discharge. No scleral icterus.  Neck: Normal range of motion. Neck supple.  Cardiovascular: Normal rate and regular rhythm. Exam reveals no gallop and no friction rub.  No murmur heard. Pulmonary/Chest: Effort normal and breath sounds normal. No stridor. No respiratory distress. He has no rales.  Abdominal: Soft. He exhibits no distension. There is no tenderness.  Musculoskeletal: He exhibits no edema or tenderness.  2+BLE edema  Neurological: He is alert and oriented to person, place, and time.  Skin: Skin is warm and dry. No rash noted. He is not diaphoretic. No erythema.  Psychiatric: He has a normal mood and affect.  Vitals reviewed.   ED Results and Treatments Labs (all labs ordered are listed, but only abnormal results are displayed) Labs Reviewed  BASIC METABOLIC PANEL - Abnormal; Notable for the following components:      Result Value   Glucose, Bld 142 (*)    Creatinine, Ser 1.26 (*)    All other components within normal limits  CBC - Abnormal; Notable for the following components:   HCT 38.2 (*)    MCV 70.6 (*)    MCH 24.6 (*)    All other components within normal limits  I-STAT TROPONIN, ED                                                                                                                          EKG  EKG Interpretation  Date/Time:  Friday Dec 29 2017 19:38:12 EDT Ventricular Rate:  67 PR Interval:    QRS Duration: 102 QT Interval:  401 QTC Calculation: 424 R Axis:   31 Text Interpretation:  Sinus arrhythmia Borderline T abnormalities, lateral leads NO STEMI Confirmed by Drema Pry [phone]) on 12/29/2017 7:43:25 PM      Radiology Dg Chest 2 View  Result Date: 12/29/2017 CLINICAL DATA:  Left-sided chest pain and shortness of breath. EXAM: CHEST - 2 VIEW COMPARISON:  Chest x-ray dated November 26, 2017. FINDINGS: Stable borderline cardiomegaly. Normal pulmonary vascularity. Mild subsegmental atelectasis in the right lower lobe. No focal consolidation, pleural effusion, or pneumothorax. No acute osseous abnormality. IMPRESSION: No active cardiopulmonary disease. Electronically Signed   By: Obie Dredge M.D.   On: 12/29/2017 12:32   Pertinent labs & imaging results that were available during my care of the patient were reviewed by me and considered in my medical decision making (see chart for details).  Medications Ordered in ED Medications  nitroGLYCERIN (NITROSTAT) SL tablet 0.4 mg (0.4 mg Sublingual Given 12/29/17 2052)  aspirin chewable tablet 324 mg (324 mg Oral Given 12/29/17 2024)  acetaminophen (TYLENOL) tablet 1,000 mg (1,000 mg Oral Given 12/29/17 2024)  Procedures Procedures  (including critical care time)  Medical Decision Making / ED Course I have reviewed the nursing notes for this encounter and the patient's prior records (if available in EHR or on provided paperwork).    Patient presents with typical chest pain.  EKG with flattening of T waves in the lateral leads.  No evidence of ST segment elevation MI.  Initial troponin negative.  Heart score greater than 4.  No prior stress testing or catheterizations.  Pain completely resolved  with nitroglycerin.  Feel he will need to be admitted for ACS rule out.  Low pretest probability for pulmonary embolism.  Presentation not classic for aortic dissection or esophageal perforation.  Chest x-ray without evidence suggestive of pneumonia, pneumothorax, pneumomediastinum.  No abnormal contour of the mediastinum to suggest dissection. No evidence of acute injuries.  Non focal neuro exam. No recent head trauma. No fever. Doubt meningitis. Doubt intracranial bleed. Doubt IIH. No indication for imaging. Headache improved with NTG.  Will discuss case with medicine regarding admission for ACS rule out.  Final Clinical Impression(s) / ED Diagnoses Final diagnoses:  Unstable angina (HCC)  Tension headache      This chart was dictated using voice recognition software.  Despite best efforts to proofread,  errors can occur which can change the documentation meaning.   Nira Conn, MD 12/30/17 [phone]

## 2017-12-29 NOTE — H&P (Signed)
History and Physical    Paul Lamb John WUJ:[id number] DOB: 1985-08-28 DOA: 12/29/2017  PCP: Marcine Matar, MD  Patient coming from: Home.  Chief Complaint: Chest pain and headache.  HPI: Paul Lamb is a 32 y.o. male with history of diabetes mellitus type 2 admitted for DKA last month presents to the ER with complaints of chest pain and headache.  Patient has been having chest pain retrosternal pressure-like increase on exertion for the last 4 days happens once or twice a day.  It is associated with some shortness of breath and lower extremity edema.  Patient also has some nausea vomiting denies any abdominal pain.  Also had experienced some headache which patient is unable to exactly characterize.  Has not lost function of his upper or lower extremity.    ED Course: In the ER blood pressure was initially elevated which improved.  Appears nonfocal.  Chest pain-free.  Cardiac markers were negative chest x-ray unremarkable on exam patient is chest pain-free and has mild bilateral lower extremity edema.  Does not appear to be in distress.  Given the risk factors including hypertension diabetes mellitus morbid obesity will admit for further ACS rule out.  Review of Systems: As per HPI, rest all negative.   Past Medical History:  Diagnosis Date  . Acute combined systolic and diastolic CHF, NYHA class 3 (HCC) 11/18/2016   EF now 40-45% by echo  . Acute diastolic (congestive) heart failure (HCC) 01/12/2016  . Diabetes mellitus, new onset (HCC)   . Hypertension   . Morbid obesity (HCC)   . OSA (obstructive sleep apnea)     Past Surgical History:  Procedure Laterality Date  . NO PAST SURGERIES       reports that he has never smoked. He has never used smokeless tobacco. He reports that he drinks alcohol. He reports that he does not use drugs.  Allergies  Allergen Reactions  . Imdur [Isosorbide Dinitrate] Other (See Comments)    Headaches   . Penicillins Other (See Comments)      From childhood; reaction not known: Has patient had a PCN reaction causing immediate rash, facial/tongue/throat swelling, SOB or lightheadedness with hypotension: Unk Has patient had a PCN reaction causing severe rash involving mucus membranes or skin necrosis: Unk Has patient had a PCN reaction that required hospitalization: Unk Has patient had a PCN reaction occurring within the last 10 years: No If all of the above answers are "NO", then may proceed with Cephalosporin use.     Family History  Problem Relation Age of Onset  . Hypertension Unknown   . Diabetes Mellitus II Unknown     Prior to Admission medications   Medication Sig Start Date End Date Taking? Authorizing Provider  amLODipine (NORVASC) 10 MG tablet Take 1 tablet (10 mg total) by mouth daily. 12/12/17  Yes Marcine Matar, MD  aspirin 81 MG EC tablet Take 1 tablet (81 mg total) by mouth daily. 12/12/17  Yes Marcine Matar, MD  atorvastatin (LIPITOR) 80 MG tablet Take 0.5 tablets (40 mg total) by mouth daily at 6 PM. 12/12/17  Yes Marcine Matar, MD  carvedilol (COREG) 25 MG tablet Take 1 tablet (25 mg total) by mouth 2 (two) times daily with a meal. 12/12/17  Yes Marcine Matar, MD  glipiZIDE (GLUCOTROL) 10 MG tablet Take 1 tablet (10 mg total) by mouth 2 (two) times daily before a meal. 12/12/17  Yes Marcine Matar, MD  hydrALAZINE (APRESOLINE) 100 MG  tablet Take 1 tablet (100 mg total) by mouth every 8 (eight) hours. 12/12/17  Yes Marcine Matar, MD  Insulin Glargine (LANTUS SOLOSTAR) 100 UNIT/ML Solostar Pen Inject 10 Units into the skin daily at 10 pm. Patient taking differently: Inject 10 Units into the skin at bedtime.  12/12/17  Yes Marcine Matar, MD  irbesartan (AVAPRO) 150 MG tablet Take 1 tablet (150 mg total) by mouth daily. 12/12/17  Yes Marcine Matar, MD  metFORMIN (GLUCOPHAGE) 1000 MG tablet Take 1 tablet (1,000 mg total) by mouth 2 (two) times daily with a meal. 12/12/17  Yes  Marcine Matar, MD  sertraline (ZOLOFT) 100 MG tablet Take 1 tablet (100 mg total) by mouth daily. 12/12/17  Yes Marcine Matar, MD  sildenafil (VIAGRA) 100 MG tablet Take 1/2-1 tab PO 30 mins before intercourse PRN. Limit to 100 mg/24 hrs 06/07/17  Yes Marcine Matar, MD  spironolactone (ALDACTONE) 25 MG tablet Take 1 tablet (25 mg total) by mouth daily. 12/12/17  Yes Marcine Matar, MD  Insulin Pen Needle 31G X 8 MM MISC Use as directed. 12/12/17   Marcine Matar, MD    Physical Exam: Vitals:   12/29/17 2100 12/29/17 2130 12/29/17 2200 12/29/17 2230  BP: (!) 163/86 (!) 159/83 (!) 141/78 (!) 143/75  Pulse: 65 69 70 67  Resp: (!) 22 (!) 22 (!) 22 (!) 23  Temp:      TempSrc:      SpO2: 90% 90% 90% (!) 89%  Weight:      Height:          Constitutional: Moderately built and nourished. Vitals:   12/29/17 2100 12/29/17 2130 12/29/17 2200 12/29/17 2230  BP: (!) 163/86 (!) 159/83 (!) 141/78 (!) 143/75  Pulse: 65 69 70 67  Resp: (!) 22 (!) 22 (!) 22 (!) 23  Temp:      TempSrc:      SpO2: 90% 90% 90% (!) 89%  Weight:      Height:       Eyes: Anicteric no pallor. ENMT: No discharge from the ears eyes nose or mouth. Neck: No mass palpated no neck rigidity. Respiratory: No rhonchi or crepitations. Cardiovascular: S1-S2 heard no murmurs appreciated. Abdomen: Soft nontender bowel sounds present. Musculoskeletal: Bilateral lower extremity edema present. Skin: No rash. Neurologic: Mildly sleepy but answers questions oriented to time place and person.  Moves all extremities. Psychiatric: Mildly sleepy but answers questions appropriately.   Labs on Admission: I have personally reviewed following labs and imaging studies  CBC: Recent Labs  Lab 12/29/17 1139  WBC 8.0  HGB 13.3  HCT 38.2*  MCV 70.6*  PLT 259   Basic Metabolic Panel: Recent Labs  Lab 12/29/17 1139  NA 139  K 4.1  CL 104  CO2 25  GLUCOSE 142*  BUN 16  CREATININE 1.26*  CALCIUM 9.6    GFR: Estimated Creatinine Clearance: 149.6 mL/min (A) (by C-G formula based on SCr of 1.26 mg/dL (H)). Liver Function Tests: No results for input(s): AST, ALT, ALKPHOS, BILITOT, PROT, ALBUMIN in the last 168 hours. No results for input(s): LIPASE, AMYLASE in the last 168 hours. No results for input(s): AMMONIA in the last 168 hours. Coagulation Profile: No results for input(s): INR, PROTIME in the last 168 hours. Cardiac Enzymes: No results for input(s): CKTOTAL, CKMB, CKMBINDEX, TROPONINI in the last 168 hours. BNP (last 3 results) No results for input(s): PROBNP in the last 8760 hours. HbA1C: No results for input(s):  HGBA1C in the last 72 hours. CBG: No results for input(s): GLUCAP in the last 168 hours. Lipid Profile: No results for input(s): CHOL, HDL, LDLCALC, TRIG, CHOLHDL, LDLDIRECT in the last 72 hours. Thyroid Function Tests: No results for input(s): TSH, T4TOTAL, FREET4, T3FREE, THYROIDAB in the last 72 hours. Anemia Panel: No results for input(s): VITAMINB12, FOLATE, FERRITIN, TIBC, IRON, RETICCTPCT in the last 72 hours. Urine analysis:    Component Value Date/Time   COLORURINE STRAW (A) 11/26/2017 1723   APPEARANCEUR CLEAR 11/26/2017 1723   LABSPEC 1.026 11/26/2017 1723   PHURINE 6.0 11/26/2017 1723   GLUCOSEU >=500 (A) 11/26/2017 1723   HGBUR NEGATIVE 11/26/2017 1723   BILIRUBINUR NEGATIVE 11/26/2017 1723   KETONESUR 20 (A) 11/26/2017 1723   PROTEINUR NEGATIVE 11/26/2017 1723   NITRITE NEGATIVE 11/26/2017 1723   LEUKOCYTESUR NEGATIVE 11/26/2017 1723   Sepsis Labs: @LABRCNTIP (procalcitonin:4,lacticidven:4) )No results found for this or any previous visit (from the past 240 hour(s)).   Radiological Exams on Admission: Dg Chest 2 View  Result Date: 12/29/2017 CLINICAL DATA:  Left-sided chest pain and shortness of breath. EXAM: CHEST - 2 VIEW COMPARISON:  Chest x-ray dated November 26, 2017. FINDINGS: Stable borderline cardiomegaly. Normal pulmonary vascularity.  Mild subsegmental atelectasis in the right lower lobe. No focal consolidation, pleural effusion, or pneumothorax. No acute osseous abnormality. IMPRESSION: No active cardiopulmonary disease. Electronically Signed   By: Obie Dredge M.D.   On: 12/29/2017 12:32    EKG: Independently reviewed.  Normal sinus rhythm with sinus arrhythmia.  Assessment/Plan Principal Problem:   Chest pain Active Problems:   Morbid obesity, unspecified obesity type (HCC)   Sleep apnea   Chronic diastolic CHF (congestive heart failure) (HCC)   Diabetes mellitus type 2 in obese (HCC)    1. Chest pain with shortness of breath -we will cycle cardiac markers check BNP d-dimer and recheck 2D echo.  Patient will be continued on Coreg and aspirin antihypertensives.  Cardiology notified.  Will check Dopplers of the lower extremity.  If d-dimer is positive may have to rule out PE.  Patient states he did have some nausea vomiting.  Abdomen appears benign.  LFTs and lipase are pending.  If there are further episodes of vomiting may need further imaging. 2. Headache and appears mildly lethargic -patient appears sleepy.  If continues will get ABG.  CT head is pending.  Headache free at this time.  Likely related to blood pressure.  Patient appears nonfocal. 3. Hypertension uncontrolled -in addition to his home medications including spironolactone amlodipine Coreg hydralazine ARB I have placed patient on PRN IV hydralazine.  Closely follow blood pressure trends. 4. Sleep apnea with CPAP.  Will check ABG if patient continues to be lethargic. 5. Chronic diastolic dysfunction last EF measured in January 2019 was 50 to 50%.  There is mild fluid overload in the lower extremity.  Will give 1 dose of Lasix.  Continue spironolactone check BNP and continue to follow intake output and respiratory status.  Follow ABG. 6. Diabetes mellitus type 2 on Lantus insulin which will be continued along with sliding scale coverage.  Hold oral  hypoglycemics while inpatient.  Patient was admitted for DKA last month. 7. Morbid obesity.   DVT prophylaxis: Lovenox. Code Status: Full code. Family Communication: Discussed with patient. Disposition Plan: Home. Consults called: Cardiology notified. Admission status: Observation.   Eduard Clos MD [facility] Pager [phone].  If 7PM-7AM, please contact night-coverage [email or web address] Password [email or web address]  12/29/2017, 10:51 PM

## 2017-12-29 NOTE — ED Triage Notes (Signed)
States  He was hospitalized several weeks ago for DKA 1 week after being d/c c/o headache, c/o tingling and numbness with burning sensation in his leg onset several weeks , difference is its not getting better.

## 2017-12-29 NOTE — Consult Note (Signed)
Patient ID: RANDOL ZUMSTEIN MRN: [id number], DOB/AGE: September 26, 1985  Admit date: 12/29/2017 Primary Physician: Marcine Matar, MD Primary Inpatient Attending: Stefani Dama Primary Cardiologist: Nanetta Batty, MD  CARDIOLOGY CONSULT NOTE   HISTORY OF PRESENT ILLNESS   Mr. Huot is a 32 year old man with a history of morbid obesity (BMI 52), CHF (+ diastolic dysfcuntion, EF previously as low as 40-45% was most recently 50-55% on 09/11/2017, OSA, poorly controlled diabetes and hypertension who presents to the ED with three days of chest discomfort. In the past 12 months the patient has had several admissions for complications of his chronic medical issues including hypertensive urgency/emergency, DKA, fluid overload and HFpEF exacerbation, and acute kidney injury. The patient has had difficulties with medication noncompliance and has missed several clinic appointments.   Today he presents with three days of chest pain accompanied by LE edema and mild worsening of his DOE. He can walk ~1 flight of stairs, but he becoems dyspneic. He has been wearing CPAP and denies orthopnea. His CP waxes and wanes, is location in the center/left side of chest, and is dull/aching in quality. Currently the chest pain is 6/10, however, the patient appears comfortable and is falling asleep mid conversation. He denies palpitations syncope or presyncope. He states he has been compliant with his medications and low salt diet recommendations. No cough.  Review of Systems General:  No chills, fever, night sweats or weight changes.  Cardiovascular:  See above Dermatological: No rash, lesions/masses Respiratory: See above Urologic: No hematuria, dysuria Abdominal:   No nausea, vomiting, diarrhea, bright red blood per rectum, melena, or hematemesis Neurologic:  No visual changes, wkns, changes in mental status. All other systems reviewed and are otherwise negative except as noted above.   MEDICAL, FAMILY, AND  SOCIAL HISTORY   Past Medical History:  Diagnosis Date  . Acute combined systolic and diastolic CHF, NYHA class 3 (HCC) 11/18/2016   EF now 40-45% by echo  . Acute diastolic (congestive) heart failure (HCC) 01/12/2016  . Diabetes mellitus, new onset (HCC)   . Hypertension   . Morbid obesity (HCC)   . OSA (obstructive sleep apnea)     Past Surgical History:  Procedure Laterality Date  . NO PAST SURGERIES      Allergies  Allergen Reactions  . Imdur [Isosorbide Dinitrate] Other (See Comments)    Headaches   . Penicillins Other (See Comments)    From childhood; reaction not known: Has patient had a PCN reaction causing immediate rash, facial/tongue/throat swelling, SOB or lightheadedness with hypotension: Unk Has patient had a PCN reaction causing severe rash involving mucus membranes or skin necrosis: Unk Has patient had a PCN reaction that required hospitalization: Unk Has patient had a PCN reaction occurring within the last 10 years: No If all of the above answers are "NO", then may proceed with Cephalosporin use.    Prior to Admission medications   Medication Sig Start Date End Date Taking? Authorizing Provider  amLODipine (NORVASC) 10 MG tablet Take 1 tablet (10 mg total) by mouth daily. 12/12/17  Yes Marcine Matar, MD  aspirin 81 MG EC tablet Take 1 tablet (81 mg total) by mouth daily. 12/12/17  Yes Marcine Matar, MD  atorvastatin (LIPITOR) 80 MG tablet Take 0.5 tablets (40 mg total) by mouth daily at 6 PM. 12/12/17  Yes Marcine Matar, MD  carvedilol (COREG) 25 MG tablet Take 1 tablet (25 mg total) by mouth 2 (two) times daily with a  meal. 12/12/17  Yes Marcine Matar, MD  glipiZIDE (GLUCOTROL) 10 MG tablet Take 1 tablet (10 mg total) by mouth 2 (two) times daily before a meal. 12/12/17  Yes Marcine Matar, MD  hydrALAZINE (APRESOLINE) 100 MG tablet Take 1 tablet (100 mg total) by mouth every 8 (eight) hours. 12/12/17  Yes Marcine Matar, MD  Insulin  Glargine (LANTUS SOLOSTAR) 100 UNIT/ML Solostar Pen Inject 10 Units into the skin daily at 10 pm. Patient taking differently: Inject 10 Units into the skin at bedtime.  12/12/17  Yes Marcine Matar, MD  irbesartan (AVAPRO) 150 MG tablet Take 1 tablet (150 mg total) by mouth daily. 12/12/17  Yes Marcine Matar, MD  metFORMIN (GLUCOPHAGE) 1000 MG tablet Take 1 tablet (1,000 mg total) by mouth 2 (two) times daily with a meal. 12/12/17  Yes Marcine Matar, MD  sertraline (ZOLOFT) 100 MG tablet Take 1 tablet (100 mg total) by mouth daily. 12/12/17  Yes Marcine Matar, MD  sildenafil (VIAGRA) 100 MG tablet Take 1/2-1 tab PO 30 mins before intercourse PRN. Limit to 100 mg/24 hrs 06/07/17  Yes Marcine Matar, MD  spironolactone (ALDACTONE) 25 MG tablet Take 1 tablet (25 mg total) by mouth daily. 12/12/17  Yes Marcine Matar, MD  Insulin Pen Needle 31G X 8 MM MISC Use as directed. 12/12/17   Marcine Matar, MD   Family History  Problem Relation Age of Onset  . Hypertension Unknown   . Diabetes Mellitus II Unknown    Social History   Socioeconomic History  . Marital status: Married    Spouse name: Not on file  . Number of children: Not on file  . Years of education: Not on file  . Highest education level: Not on file  Occupational History  . Not on file  Social Needs  . Financial resource strain: Not on file  . Food insecurity:    Worry: Not on file    Inability: Not on file  . Transportation needs:    Medical: Not on file    Non-medical: Not on file  Tobacco Use  . Smoking status: Never Smoker  . Smokeless tobacco: Never Used  Substance and Sexual Activity  . Alcohol use: Yes    Alcohol/week: 0.0 oz  . Drug use: No  . Sexual activity: Yes  Lifestyle  . Physical activity:    Days per week: 0 days    Minutes per session: 0 min  . Stress: To some extent  Relationships  . Social connections:    Talks on phone: Patient refused    Gets together: Patient refused      Attends religious service: Patient refused    Active member of club or organization: Patient refused    Attends meetings of clubs or organizations: Patient refused    Relationship status: Patient refused  . Intimate partner violence:    Fear of current or ex partner: No    Emotionally abused: No    Physically abused: No    Forced sexual activity: No  Other Topics Concern  . Not on file  Social History Narrative  . Not on file      PHYSICAL EXAM  Blood pressure (!) 143/75, pulse 67, temperature 98.2 F (36.8 C), temperature source Oral, resp. rate (!) 23, height  (1.88 m), weight (!) 185.1 kg (408 lb), SpO2 (!) 89 %.  General: Morbidly obsese, Pleasant, NAD Psych: Normal affect. Neuro: Alert and oriented X 3. Moves  all extremities spontaneously. HEENT: Sclera anicteric, noninjected. MMM.  Neck: JVP is difficult to appreciate given habitus. Lungs:  Distant lung sounds, normal WOB with no w/r/r Heart: distant heart sounds, normal S1 and S2, RRR Abdomen: Soft, non-tender, non-distended, BS + x 4.  Extremities: No clubbing or cyanosis. Edema: 2+ BL LE edema to ankles. DP/PT/Radials 2+ and equal bilaterally.   LABS and STUDIES  Troponin (Point of Care Test) Recent Labs    12/29/17 1204  TROPIPOC 0.00   No results for input(s): CKTOTAL, CKMB, TROPONINI in the last 72 hours. Lab Results  Component Value Date   WBC 8.0 12/29/2017   HGB 13.3 12/29/2017   HCT 38.2 (L) 12/29/2017   MCV 70.6 (L) 12/29/2017   PLT 259 12/29/2017    Recent Labs  Lab 12/29/17 1139  NA 139  K 4.1  CL 104  CO2 25  BUN 16  CREATININE 1.26*  CALCIUM 9.6  GLUCOSE 142*   Lab Results  Component Value Date   CHOL 295 (H) 09/10/2017   HDL 39 (L) 09/10/2017   LDLCALC 227 (H) 09/10/2017   TRIG 145 09/10/2017   Lab Results  Component Value Date   DDIMER 0.57 (H) 11/26/2017     Radiology/Studies. Independently Reviewed CXR: clear  ECG was independently reviewed. Sinus arrhythmia,  poor R-wave progression  Echocardiogram 09/11/2017 - Left ventricle: The cavity size was mildly dilated. Wall thickness was increased in a pattern of moderate LVH. Systolic function was normal. The estimated ejection fraction was in the range of 50% to 55%. Wall motion was normal; there were no regional wall motion abnormalities. The study is not technically sufficient to allow evaluation of LV diastolic function. - Left atrium: The atrium was mildly dilated.   ASSESSMENT and RECOMMENDATIONS   Mr. Jacox is a 32 year old man with a history of morbid obesity (BMI 52), CHF (+ diastolic dysfcuntion, EF previously as low as 40-45% was most recently 50-55% on 09/11/2017, OSA, poorly controlled diabetes and hypertension whom we have been asked to see by Dr. Toniann Fail to evaluate chest pain.  Chest pain is unlikely due to ACS, as it has lasted three days, ECG shows no ischemic changes, and troponin is normal. It is reasonable to rule out for ACS with serial troponins, but patient had several chest pain admissions previously with no suggestion of ACS. No indication for heparin at this time. Ischemic evaluation can be considered either as inpatient or outpatient. Ideally, the patient would demonstrate that he will reliably take his medications and come to follow-up appointments before undergoing invasive evaluation.  Acute on Chronic Diastolic CHF. Patient is mildly fluid overloaded today. This may be contributing to his chest discomfort. - Recommend mild diuresis (e.g. Furosemide 40 mg PO). Patient may benefit from at least PRN diuretics as an outpatient. - Continue salt restriction  Hypertension. Initially elevated BPs in ED reportedly due to faulty cuff. BP currently 140s/70s. Continue current regimen.  CKD with proteinuria. Cr ~1.25, at baseline  Diabets. Poorly controlled with Hgb A1C 12.1. Current blood sugars are stable.   Reva Pinkley D, MD 12/29/2017, 10:54 PM

## 2017-12-30 ENCOUNTER — Observation Stay (HOSPITAL_BASED_OUTPATIENT_CLINIC_OR_DEPARTMENT_OTHER): Payer: Medicaid Other

## 2017-12-30 ENCOUNTER — Observation Stay (HOSPITAL_COMMUNITY): Payer: Medicaid Other

## 2017-12-30 ENCOUNTER — Encounter (HOSPITAL_COMMUNITY): Payer: Self-pay | Admitting: Emergency Medicine

## 2017-12-30 ENCOUNTER — Other Ambulatory Visit: Payer: Self-pay

## 2017-12-30 DIAGNOSIS — R0789 Other chest pain: Secondary | ICD-10-CM | POA: Diagnosis not present

## 2017-12-30 DIAGNOSIS — N189 Chronic kidney disease, unspecified: Secondary | ICD-10-CM | POA: Diagnosis present

## 2017-12-30 DIAGNOSIS — I5032 Chronic diastolic (congestive) heart failure: Secondary | ICD-10-CM | POA: Diagnosis present

## 2017-12-30 DIAGNOSIS — E1122 Type 2 diabetes mellitus with diabetic chronic kidney disease: Secondary | ICD-10-CM | POA: Diagnosis present

## 2017-12-30 DIAGNOSIS — E1142 Type 2 diabetes mellitus with diabetic polyneuropathy: Secondary | ICD-10-CM | POA: Diagnosis present

## 2017-12-30 DIAGNOSIS — Z794 Long term (current) use of insulin: Secondary | ICD-10-CM | POA: Diagnosis not present

## 2017-12-30 DIAGNOSIS — I110 Hypertensive heart disease with heart failure: Secondary | ICD-10-CM | POA: Diagnosis present

## 2017-12-30 DIAGNOSIS — Z9114 Patient's other noncompliance with medication regimen: Secondary | ICD-10-CM | POA: Diagnosis not present

## 2017-12-30 DIAGNOSIS — R0902 Hypoxemia: Secondary | ICD-10-CM | POA: Diagnosis present

## 2017-12-30 DIAGNOSIS — E1165 Type 2 diabetes mellitus with hyperglycemia: Secondary | ICD-10-CM | POA: Diagnosis present

## 2017-12-30 DIAGNOSIS — E785 Hyperlipidemia, unspecified: Secondary | ICD-10-CM | POA: Diagnosis present

## 2017-12-30 DIAGNOSIS — G4733 Obstructive sleep apnea (adult) (pediatric): Secondary | ICD-10-CM | POA: Diagnosis present

## 2017-12-30 DIAGNOSIS — Z8673 Personal history of transient ischemic attack (TIA), and cerebral infarction without residual deficits: Secondary | ICD-10-CM | POA: Diagnosis not present

## 2017-12-30 DIAGNOSIS — Z6841 Body Mass Index (BMI) 40.0 and over, adult: Secondary | ICD-10-CM | POA: Diagnosis not present

## 2017-12-30 DIAGNOSIS — I130 Hypertensive heart and chronic kidney disease with heart failure and stage 1 through stage 4 chronic kidney disease, or unspecified chronic kidney disease: Secondary | ICD-10-CM | POA: Diagnosis present

## 2017-12-30 DIAGNOSIS — I361 Nonrheumatic tricuspid (valve) insufficiency: Secondary | ICD-10-CM

## 2017-12-30 DIAGNOSIS — G44209 Tension-type headache, unspecified, not intractable: Secondary | ICD-10-CM | POA: Diagnosis present

## 2017-12-30 DIAGNOSIS — Z7982 Long term (current) use of aspirin: Secondary | ICD-10-CM | POA: Diagnosis not present

## 2017-12-30 LAB — GLUCOSE, CAPILLARY
GLUCOSE-CAPILLARY: 99 mg/dL (ref 65–99)
GLUCOSE-CAPILLARY: 99 mg/dL (ref 65–99)
GLUCOSE-CAPILLARY: 162 mg/dL — AB (ref 65–99)
Glucose-Capillary: 84 mg/dL (ref 65–99)
Glucose-Capillary: 84 mg/dL (ref 65–99)

## 2017-12-30 LAB — BLOOD GAS, ARTERIAL
Acid-base deficit: 0.7 mmol/L (ref 0.0–2.0)
Bicarbonate: 23.3 mmol/L (ref 20.0–28.0)
DELIVERY SYSTEMS: POSITIVE
DRAWN BY: 24513
FIO2: 21
Inspiratory PAP: 8
MODE: POSITIVE
O2 SAT: 94.8 %
PO2 ART: 78.4 mmHg — AB (ref 83.0–108.0)
Patient temperature: 98.6
pCO2 arterial: 37.9 mmHg (ref 32.0–48.0)
pH, Arterial: 7.407 (ref 7.350–7.450)

## 2017-12-30 LAB — ECHOCARDIOGRAM COMPLETE
HEIGHTINCHES: 74 in
WEIGHTICAEL: 6370.41 [oz_av]

## 2017-12-30 LAB — CBC
HEMATOCRIT: 39.3 % (ref 39.0–52.0)
Hemoglobin: 13.6 g/dL (ref 13.0–17.0)
MCH: 24.4 pg — ABNORMAL LOW (ref 26.0–34.0)
MCHC: 34.6 g/dL (ref 30.0–36.0)
MCV: 70.4 fL — AB (ref 78.0–100.0)
PLATELETS: 250 10*3/uL (ref 150–400)
RBC: 5.58 MIL/uL (ref 4.22–5.81)
RDW: 14.6 % (ref 11.5–15.5)
WBC: 8 10*3/uL (ref 4.0–10.5)

## 2017-12-30 LAB — HEPATIC FUNCTION PANEL
ALK PHOS: 91 U/L (ref 38–126)
ALT: 25 U/L (ref 17–63)
AST: 23 U/L (ref 15–41)
Albumin: 4.2 g/dL (ref 3.5–5.0)
BILIRUBIN DIRECT: 0.2 mg/dL (ref 0.1–0.5)
BILIRUBIN INDIRECT: 1 mg/dL — AB (ref 0.3–0.9)
Total Bilirubin: 1.2 mg/dL (ref 0.3–1.2)
Total Protein: 7.5 g/dL (ref 6.5–8.1)

## 2017-12-30 LAB — RESPIRATORY PANEL BY PCR
ADENOVIRUS-RVPPCR: NOT DETECTED
Bordetella pertussis: NOT DETECTED
CHLAMYDOPHILA PNEUMONIAE-RVPPCR: NOT DETECTED
CORONAVIRUS NL63-RVPPCR: NOT DETECTED
Coronavirus 229E: NOT DETECTED
Coronavirus HKU1: NOT DETECTED
Coronavirus OC43: NOT DETECTED
INFLUENZA A-RVPPCR: NOT DETECTED
Influenza B: NOT DETECTED
MYCOPLASMA PNEUMONIAE-RVPPCR: NOT DETECTED
Metapneumovirus: NOT DETECTED
PARAINFLUENZA VIRUS 4-RVPPCR: NOT DETECTED
Parainfluenza Virus 1: NOT DETECTED
Parainfluenza Virus 2: NOT DETECTED
Parainfluenza Virus 3: NOT DETECTED
Respiratory Syncytial Virus: NOT DETECTED
Rhinovirus / Enterovirus: NOT DETECTED

## 2017-12-30 LAB — CREATININE, SERUM
Creatinine, Ser: 1.07 mg/dL (ref 0.61–1.24)
GFR calc non Af Amer: >60 mL/min (ref >60)

## 2017-12-30 LAB — BRAIN NATRIURETIC PEPTIDE: B NATRIURETIC PEPTIDE 5: 18.5 pg/mL (ref 0.0–100.0)

## 2017-12-30 LAB — TROPONIN I
Troponin I: <0.03 ng/mL (ref <0.03)
Troponin I: <0.03 ng/mL (ref <0.03)
Troponin I: <0.03 ng/mL (ref <0.03)

## 2017-12-30 LAB — D-DIMER, QUANTITATIVE: D-Dimer, Quant: 0.66 ug/mL-FEU — ABNORMAL HIGH (ref 0.00–0.50)

## 2017-12-30 LAB — LIPASE, BLOOD: LIPASE: 25 U/L (ref 11–51)

## 2017-12-30 MED ORDER — IOPAMIDOL (ISOVUE-370) INJECTION 76%
INTRAVENOUS | Status: AC
  Administered 2017-12-30: 100 mL
  Filled 2017-12-30: qty 100

## 2017-12-30 MED ORDER — FUROSEMIDE 10 MG/ML IJ SOLN
40.0000 mg | Freq: Once | INTRAMUSCULAR | Status: DC
  Filled 2017-12-30: qty 4

## 2017-12-30 NOTE — Care Management Note (Signed)
Case Management Note  Patient Details  Name: Paul Lamb MRN: [id number] Date of Birth: 1986/07/30  Subjective/Objective: Pt presented for Chest Pain. Pt has PCP at the [facility]-last appointment 12/18/17. Pt has a new appointment for 04-13-18 @ 1110 am to arrive by 1055. Pt is aware of next appointment. Per pt he just started a new job. No insurance available at this time.                    Action/Plan: CM received referral for medication assistance: Utilizes the [facility] for Medications. Medications range from $4.00- $10.00. Pt is working on getting the blue card for Rx coverage @ the Clinic.  (pt needs to complete paperwork)  No further needs from CM at this time.   Expected Discharge Date:                  Expected Discharge Plan:  Home/Self Care  In-House Referral:  NA  Discharge planning Services  CM Consult, Indigent Health Clinic, Medication Assistance  Post Acute Care Choice:  NA Choice offered to:  NA  DME Arranged:  N/A DME Agency:  NA  HH Arranged:  NA HH Agency:  NA  Status of Service:  Completed, signed off  If discussed at Long Length of Stay Meetings, dates discussed:    Additional Comments:  Gala Lewandowsky, RN 12/30/2017, 11:15 AM

## 2017-12-30 NOTE — Progress Notes (Signed)
Late Entry: Patient placed on CPAP and doing well. Unable to exactly remember setting so placed on Auto mode. No O2 needed at this time. Patient resting comfortably.

## 2017-12-30 NOTE — Progress Notes (Signed)
Subjective:  Denies SSCP, palpitations or Dyspnea   Objective:  Vitals:   12/30/17 0000 12/30/17 0106 12/30/17 0306 12/30/17 0600  BP: 122/69 (!) 146/100  138/90  Pulse: 71 62 71 61  Resp: (!) 24 16 16  (!) 24  Temp:  97.7 F (36.5 C)  97.8 F (36.6 C)  TempSrc:  Oral  Oral  SpO2: 94% 99% 99% 95%  Weight:  (!) 398 lb 14.4 oz (180.9 kg)  (!) 398 lb 2.4 oz (180.6 kg)  Height:  6\' 2"  (1.88 m)      Intake/Output from previous day: No intake or output data in the 24 hours ending 12/30/17 8882  Physical Exam: Affect appropriate Morbidly obese black male  HEENT: normal Neck supple with no adenopathy JVP normal no bruits no thyromegaly Lungs clear with no wheezing and good diaphragmatic motion Heart:  S1/S2 no murmur, no rub, gallop or click PMI normal Abdomen: benighn, BS positve, no tenderness, no AAA no bruit.  No HSM or HJR Distal pulses intact with no bruits No edema Neuro non-focal Skin warm and dry No muscular weakness   Lab Results: Basic Metabolic Panel: Recent Labs    12/29/17 1139 12/30/17 0121  NA 139  --   K 4.1  --   CL 104  --   CO2 25  --   GLUCOSE 142*  --   BUN 16  --   CREATININE 1.26* 1.07  CALCIUM 9.6  --    Liver Function Tests: Recent Labs    12/30/17 0121  AST 23  ALT 25  ALKPHOS 91  BILITOT 1.2  PROT 7.5  ALBUMIN 4.2   Recent Labs    12/30/17 0121  LIPASE 25   CBC: Recent Labs    12/29/17 1139 12/30/17 0121  WBC 8.0 8.0  HGB 13.3 13.6  HCT 38.2* 39.3  MCV 70.6* 70.4*  PLT 259 250   Cardiac Enzymes: Recent Labs    12/30/17 0121 12/30/17 0639  TROPONINI <0.03 <0.03   BNP: Invalid input(s): POCBNP D-Dimer: Recent Labs    12/30/17 0121  DDIMER 0.66*   Hemoglobin A1C: No results for input(s): HGBA1C in the last 72 hours. Fasting Lipid Panel: No results for input(s): CHOL, HDL, LDLCALC, TRIG, CHOLHDL, LDLDIRECT in the last 72 hours. Thyroid Function Tests: No results for input(s): TSH, T4TOTAL,  T3FREE, THYROIDAB in the last 72 hours.  Invalid input(s): FREET3 Anemia Panel: No results for input(s): VITAMINB12, FOLATE, FERRITIN, TIBC, IRON, RETICCTPCT in the last 72 hours.  Imaging: Dg Chest 2 View  Result Date: 12/29/2017 CLINICAL DATA:  Left-sided chest pain and shortness of breath. EXAM: CHEST - 2 VIEW COMPARISON:  Chest x-ray dated November 26, 2017. FINDINGS: Stable borderline cardiomegaly. Normal pulmonary vascularity. Mild subsegmental atelectasis in the right lower lobe. No focal consolidation, pleural effusion, or pneumothorax. No acute osseous abnormality. IMPRESSION: No active cardiopulmonary disease. Electronically Signed   By: Obie Dredge M.D.   On: 12/29/2017 12:32   Ct Head Wo Contrast  Result Date: 12/30/2017 CLINICAL DATA:  Acute severe worst headache of life. EXAM: CT HEAD WITHOUT CONTRAST TECHNIQUE: Contiguous axial images were obtained from the base of the skull through the vertex without intravenous contrast. COMPARISON:  12/13/2006 FINDINGS: Brain: The brain shows a normal appearance without evidence of malformation, atrophy, old or acute small or large vessel infarction, mass lesion, hemorrhage, hydrocephalus or extra-axial collection. Vascular: No hyperdense vessel. No evidence of atherosclerotic calcification. Skull: Normal.  No traumatic finding.  No focal  bone lesion. Sinuses/Orbits: Sinuses are clear. Orbits appear normal. Mastoids are clear. Other: None significant IMPRESSION: Normal head CT Electronically Signed   By: Paulina Fusi M.D.   On: 12/30/2017 08:59    Cardiac Studies:  ECG:  SR LVH no acute ST changes    Telemetry:  NSR 12/30/2017   Echo: January 2019 LVH EF 60-65%   Medications:   . amLODipine  10 mg Oral Daily  . aspirin EC  325 mg Oral Daily  . atorvastatin  40 mg Oral q1800  . carvedilol  25 mg Oral BID WC  . enoxaparin (LOVENOX) injection  90 mg Subcutaneous Daily  . furosemide  40 mg Intravenous Once  . hydrALAZINE  100 mg Oral Q8H    . insulin aspart  0-9 Units Subcutaneous TID WC  . insulin glargine  10 Units Subcutaneous QHS  . iopamidol      . irbesartan  150 mg Oral Daily  . sertraline  100 mg Oral Daily  . spironolactone  25 mg Oral Daily      Assessment/Plan:   Chest pain : atypical r/o no acute ECG changes Echo normal in January Ok to d/c home will arrange outpatient F/u with DR Allyson Sabal  HTN:  Continue diuretic ARB , hydralazine and amlodipine  HLD:  On statin labs with primary    Charlton Haws 12/30/2017, 9:18 AM

## 2017-12-30 NOTE — Progress Notes (Signed)
  Echocardiogram 2D Echocardiogram has been performed.  Rukia Mcgillivray T Finis Hendricksen 12/30/2017, 12:58 PM

## 2017-12-30 NOTE — Progress Notes (Signed)
PROGRESS NOTE    Paul Lamb  ZOX:[id number] DOB: March 19, 1986 DOA: 12/29/2017 PCP: Marcine Matar, MD   Brief Narrative: Paul Lamb is a 32 y.o. male with history of diabetes mellitus type 2 admitted for DKA last month presents to the ER with complaints of chest pain and headache.  Patient has been having chest pain retrosternal pressure-like increase on exertion for the last 4 days happens once or twice a day.  It is associated with some shortness of breath and lower extremity edema.  Patient also has some nausea vomiting denies any abdominal pain.  Also had experienced some headache which patient is unable to exactly characterize.  Has not lost function of his upper or lower extremity.    ED Course: In the ER blood pressure was initially elevated which improved.  Appears nonfocal.  Chest pain-free.  Cardiac markers were negative chest x-ray unremarkable on exam patient is chest pain-free and has mild bilateral lower extremity edema.  Does not appear to be in distress.  Given the risk factors including hypertension diabetes mellitus morbid obesity will admit for further ACS rule out.     Assessment & Plan:   Principal Problem:   Chest pain Active Problems:   Morbid obesity, unspecified obesity type (HCC)   Sleep apnea   Chronic diastolic CHF (congestive heart failure) (HCC)   Diabetes mellitus type 2 in obese (HCC)   1-Chest pain, mild hypoxia on ABG. D dimer mild elevated.  Check Ct angio; which was negative for PE, pulmonary infiltrates/ check viral panel.  Troponin negative.  ECHO; Left ventricle: The cavity size was normal. Wall thickness was  increased in a pattern of severe LVH. Systolic function was normal. The estimated ejection fraction was in the range of 60% to 65%. Wall motion was normal; there were no regional wall motion abnormalities. Left ventricular diastolic function parameters were normal. Cardiology recommend out patient follow up.  Check uds.    2-Headache; ct head negative.   HTN; continue with norvasc, coreg, hydralazine, avapro, spironolactone.   Sleep apnea; cpap/  Chronic diastolic HF;  continue with spironolactone.   DM; lantus. SSI.   Morbid obesity;    DVT prophylaxis: lovenox Code Status: full code.  Family Communication: care discussed with patient.  Disposition Plan:  Home when stable.   Consultants:   Cardiology    Procedures:   Echo    Antimicrobials:   none   Subjective: He is feeling ok, waking up.  Relates chest pain was sharp in quality. No associated with deep breath.   Objective: Vitals:   12/30/17 0306 12/30/17 0600 12/30/17 1044 12/30/17 1410  BP:  138/90 (!) 147/92 133/67  Pulse: 71 61    Resp: 16 (!) 24    Temp:  97.8 F (36.6 C)    TempSrc:  Oral    SpO2: 99% 95%    Weight:  (!) 180.6 kg (398 lb 2.4 oz)    Height:       No intake or output data in the 24 hours ending 12/30/17 1418 Filed Weights   12/29/17 1106 12/30/17 0106 12/30/17 0600  Weight: (!) 185.1 kg (408 lb) (!) 180.9 kg (398 lb 14.4 oz) (!) 180.6 kg (398 lb 2.4 oz)    Examination:  General exam: Appears calm and comfortable  Respiratory system: Clear to auscultation. Respiratory effort normal. Cardiovascular system: S1 & S2 heard, RRR. No JVD, murmurs, rubs, gallops or clicks. No pedal edema. Gastrointestinal system: Abdomen is nondistended, soft and nontender.  No organomegaly or masses felt. Normal bowel sounds heard. Central nervous system: sleepy . No focal neurological deficits. Extremities: Symmetric 5 x 5 power. Skin: No rashes, lesions or ulcers Psychiatry: Judgement and insight appear normal. Mood & affect appropriate.     Data Reviewed: I have personally reviewed following labs and imaging studies  CBC: Recent Labs  Lab 12/29/17 1139 12/30/17 0121  WBC 8.0 8.0  HGB 13.3 13.6  HCT 38.2* 39.3  MCV 70.6* 70.4*  PLT 259 250   Basic Metabolic Panel: Recent Labs  Lab 12/29/17 1139  12/30/17 0121  NA 139  --   K 4.1  --   CL 104  --   CO2 25  --   GLUCOSE 142*  --   BUN 16  --   CREATININE 1.26* 1.07  CALCIUM 9.6  --    GFR: Estimated Creatinine Clearance: 173.6 mL/min (by C-G formula based on SCr of 1.07 mg/dL). Liver Function Tests: Recent Labs  Lab 12/30/17 0121  AST 23  ALT 25  ALKPHOS 91  BILITOT 1.2  PROT 7.5  ALBUMIN 4.2   Recent Labs  Lab 12/30/17 0121  LIPASE 25   No results for input(s): AMMONIA in the last 168 hours. Coagulation Profile: No results for input(s): INR, PROTIME in the last 168 hours. Cardiac Enzymes: Recent Labs  Lab 12/30/17 0121 12/30/17 0639  TROPONINI <0.03 <0.03   BNP (last 3 results) No results for input(s): PROBNP in the last 8760 hours. HbA1C: No results for input(s): HGBA1C in the last 72 hours. CBG: Recent Labs  Lab 12/30/17 0120 12/30/17 0728 12/30/17 1128  GLUCAP 84 99 84   Lipid Profile: No results for input(s): CHOL, HDL, LDLCALC, TRIG, CHOLHDL, LDLDIRECT in the last 72 hours. Thyroid Function Tests: No results for input(s): TSH, T4TOTAL, FREET4, T3FREE, THYROIDAB in the last 72 hours. Anemia Panel: No results for input(s): VITAMINB12, FOLATE, FERRITIN, TIBC, IRON, RETICCTPCT in the last 72 hours. Sepsis Labs: No results for input(s): PROCALCITON, LATICACIDVEN in the last 168 hours.  No results found for this or any previous visit (from the past 240 hour(s)).       Radiology Studies: Dg Chest 2 View  Result Date: 12/29/2017 CLINICAL DATA:  Left-sided chest pain and shortness of breath. EXAM: CHEST - 2 VIEW COMPARISON:  Chest x-ray dated November 26, 2017. FINDINGS: Stable borderline cardiomegaly. Normal pulmonary vascularity. Mild subsegmental atelectasis in the right lower lobe. No focal consolidation, pleural effusion, or pneumothorax. No acute osseous abnormality. IMPRESSION: No active cardiopulmonary disease. Electronically Signed   By: Obie Dredge M.D.   On: 12/29/2017 12:32   Ct  Head Wo Contrast  Result Date: 12/30/2017 CLINICAL DATA:  Acute severe worst headache of life. EXAM: CT HEAD WITHOUT CONTRAST TECHNIQUE: Contiguous axial images were obtained from the base of the skull through the vertex without intravenous contrast. COMPARISON:  12/13/2006 FINDINGS: Brain: The brain shows a normal appearance without evidence of malformation, atrophy, old or acute small or large vessel infarction, mass lesion, hemorrhage, hydrocephalus or extra-axial collection. Vascular: No hyperdense vessel. No evidence of atherosclerotic calcification. Skull: Normal.  No traumatic finding.  No focal bone lesion. Sinuses/Orbits: Sinuses are clear. Orbits appear normal. Mastoids are clear. Other: None significant IMPRESSION: Normal head CT Electronically Signed   By: Paulina Fusi M.D.   On: 12/30/2017 08:59   Ct Angio Chest Pe W Or Wo Contrast  Result Date: 12/30/2017 CLINICAL DATA:  Shortness of breath EXAM: CT ANGIOGRAPHY CHEST WITH CONTRAST TECHNIQUE: Multidetector  CT imaging of the chest was performed using the standard protocol during bolus administration of intravenous contrast. Multiplanar CT image reconstructions and MIPs were obtained to evaluate the vascular anatomy. CONTRAST:  ISOVUE-370 IOPAMIDOL (ISOVUE-370) INJECTION 76% COMPARISON:  November 01, 2015 FINDINGS: Cardiovascular: Satisfactory opacification of the pulmonary arteries to the segmental level. No evidence of pulmonary embolism. Normal heart size. No pericardial effusion. Mediastinum/Nodes: No enlarged mediastinal, hilar, or axillary lymph nodes. Thyroid gland, trachea, and esophagus demonstrate no significant findings. Lungs/Pleura: No focal pneumonia pleural effusion or pulmonary edema is identified. Hazy patchy ground-glass opacity is noted in both lungs. This is nonspecific, this can be seen in viral infection. Upper Abdomen: No acute abnormality. Musculoskeletal: No chest wall abnormality. No acute or significant osseous  findings. Review of the MIP images confirms the above findings. IMPRESSION: No pulmonary embolus. No focal pneumonia. Hazy patchy ground-glass opacity in both lungs. This is nonspecific, this can be seen in viral infection. Electronically Signed   By: Sherian Rein M.D.   On: 12/30/2017 12:52        Scheduled Meds: . amLODipine  10 mg Oral Daily  . aspirin EC  325 mg Oral Daily  . atorvastatin  40 mg Oral q1800  . carvedilol  25 mg Oral BID WC  . enoxaparin (LOVENOX) injection  90 mg Subcutaneous Daily  . furosemide  40 mg Intravenous Once  . hydrALAZINE  100 mg Oral Q8H  . insulin aspart  0-9 Units Subcutaneous TID WC  . insulin glargine  10 Units Subcutaneous QHS  . irbesartan  150 mg Oral Daily  . sertraline  100 mg Oral Daily  . spironolactone  25 mg Oral Daily   Continuous Infusions:   LOS: 0 days    Time spent: 35 minutes.     Alba Cory, MD [facility] Pager [phone]  If 7PM-7AM, please contact night-coverage [email or web address] Password [email or web address] 12/30/2017, 2:18 PM

## 2017-12-30 NOTE — Progress Notes (Signed)
Patient CPAP setup and ready. Patient said he would place himself on and off CPAP when he was ready. Explained that is he would run into any issues to have nurse call RT and we would be there to assist in any way we can.

## 2017-12-31 ENCOUNTER — Encounter (HOSPITAL_COMMUNITY): Payer: Self-pay

## 2017-12-31 LAB — VITAMIN B12: VITAMIN B 12: 327 pg/mL (ref 180–914)

## 2017-12-31 LAB — GLUCOSE, CAPILLARY: Glucose-Capillary: 116 mg/dL — ABNORMAL HIGH (ref 65–99)

## 2017-12-31 NOTE — Discharge Instructions (Signed)
Acute Pain, Adult  Acute pain is a type of pain that may last for just a few days or as long as six months. It is often related to an illness, injury, or medical procedure. Acute pain may be mild, moderate, or severe. It usually goes away once your injury has healed or you are no longer ill.  Pain can make it hard for you to do daily activities. It can cause anxiety and lead to other problems if left untreated. Treatment depends on the cause and severity of your acute pain.  Follow these instructions at home:   Check your pain level as told by your health care provider.   Take over-the-counter and prescription medicines only as told by your health care provider.   If you are taking prescription pain medicine:  ? Ask your health care provider about taking a stool softener or laxative to prevent constipation.  ? Do not stop taking the medicine suddenly. Talk to your health care provider about how and when to discontinue prescription pain medicine.  ? If your pain is severe, do not take more pills than instructed by your health care provider.  ? Do not take other over-the-counter pain medicines in addition to this medicine unless told by your health care provider.  ? Do not drive or operate heavy machinery while taking prescription pain medicine.   Apply ice or heat as told by your health care provider. These may reduce swelling and pain.   Ask your health care provider if other strategies such as distraction, relaxation, or physical therapies can help your pain.   Keep all follow-up visits as told by your health care provider. This is important.  Contact a health care provider if:   You have pain that is not controlled by medicine.   Your pain does not improve or gets worse.   You have side effects from pain medicines, such as vomitingor confusion.  Get help right away if:   You have severe pain.   You have trouble breathing.   You lose consciousness.   You have chest pain or pressure that lasts for more  than a few minutes. Along with the chest pain you may:  ? Have pain or discomfort in one or both arms, your back, neck, jaw, or stomach.  ? Have shortness of breath.  ? Break out in a cold sweat.  ? Feel nauseous.  ? Become light-headed.  These symptoms may represent a serious problem that is an emergency. Do not wait to see if the symptoms will go away. Get medical help right away. Call your local emergency services (911 in the [place] Do not drive yourself to the hospital.  This information is not intended to replace advice given to you by your health care provider. Make sure you discuss any questions you have with your health care provider.  Document Released: 08/23/2015 Document Revised: 01/15/2016 Document Reviewed: 08/23/2015  [employer] Interactive Patient Education  2018 [employer]

## 2017-12-31 NOTE — Discharge Summary (Addendum)
Physician Discharge Summary  Paul Lamb ZOX:[id number] DOB: 1986/03/26 DOA: 12/29/2017  PCP: Marcine Matar, MD  Admit date: 12/29/2017 Discharge date: 12/31/2017  Admitted From: Home  Disposition: Home   Recommendations for Outpatient Follow-up:  1. Follow up with PCP in 1-2 weeks 2. Please obtain BMP/CBC in one week 3. Needs to follow up with cardiology for further care of chest pain.  4. Follow b 12 results, evaluating for neuropathy.     Discharge Condition: stable.  CODE STATUS: full code.  Diet recommendation: Heart Healthy   Brief/Interim Summary: Brief Narrative: Paul Lamb a 31 y.o.malewithhistory of diabetes mellitus type 2 admitted for DKA last month presents to the ER with complaints of chest pain and headache. Patient has been having chest pain retrosternal pressure-like increase on exertion for the last 4 days happens once or twice a day. It is associated with some shortness of breath and lower extremity edema. Patient also has some nausea vomiting denies any abdominal pain. Also had experienced some headache which patient is unable to exactly characterize. Has not lost function of his upper or lower extremity.    ED Course:In the ER blood pressure was initially elevated which improved. Appears nonfocal. Chest pain-free. Cardiac markers were negative chest x-ray unremarkable on exam patient is chest pain-free and has mild bilateral lower extremity edema. Does not appear to be in distress. Given the risk factors including hypertension diabetes mellitus morbid obesity will admit for further ACS rule out.     Assessment & Plan:   Principal Problem:   Chest pain Active Problems:   Morbid obesity, unspecified obesity type (HCC)   Sleep apnea   Chronic diastolic CHF (congestive heart failure) (HCC)   Diabetes mellitus type 2 in obese (HCC)   1-Chest pain, undeterminable   mild hypoxia on ABG. D dimer mild elevated.  Check Ct  angio; which was negative for PE, ground glass opacity viral panel negative. Needs follow up for ground glass opacities on CT  Troponin negative.  ECHO; Left ventricle: The cavity size was normal. Wall thickness was increased in a pattern of severe LVH. Systolic function was normal. The estimated ejection fraction was in the range of 60% to 65%. Wall motion was normal; there were no regional wall motion abnormalities. Left ventricular diastolic function parameters were normal. Cardiology recommend out patient follow up.   Hypoxemia;  CT; Hazy patchy ground-glass opacity in both lungs. This is nonspecific, this can be seen in viral infection. Needs FU chest x ray.  Hypoxemia resolved.   2-Headache; ct head negative.   HTN; continue with norvasc, coreg, hydralazine, avapro, spironolactone.   Sleep apnea; cpap/  Chronic diastolic HF; compensated  continue with spironolactone.   DM; lantus. SSI.   Morbid obesity; counselled about loosing weight.   Neuropathy; complaint of BL feet tingling. Check B 12. DM controlled.      Discharge Diagnoses:  Principal Problem:   Chest pain Active Problems:   Morbid obesity, unspecified obesity type (HCC)   Sleep apnea   Chronic diastolic CHF (congestive heart failure) (HCC)   Diabetes mellitus type 2 in obese [facility])    Discharge Instructions  Discharge Instructions    Diet - low sodium heart healthy   Complete by:  As directed    Increase activity slowly   Complete by:  As directed      Allergies as of 12/31/2017      Reactions   Imdur [isosorbide Dinitrate] Other (See Comments)   Headaches  Penicillins Other (See Comments)   From childhood; reaction not known: Has patient had a PCN reaction causing immediate rash, facial/tongue/throat swelling, SOB or lightheadedness with hypotension: Unk Has patient had a PCN reaction causing severe rash involving mucus membranes or skin necrosis: Unk Has patient had a PCN reaction that  required hospitalization: Unk Has patient had a PCN reaction occurring within the last 10 years: No If all of the above answers are "NO", then may proceed with Cephalosporin use.      Medication List    STOP taking these medications   sildenafil 100 MG tablet Commonly known as:  VIAGRA     TAKE these medications   amLODipine 10 MG tablet Commonly known as:  NORVASC Take 1 tablet (10 mg total) by mouth daily.   aspirin 81 MG EC tablet Take 1 tablet (81 mg total) by mouth daily.   atorvastatin 80 MG tablet Commonly known as:  LIPITOR Take 0.5 tablets (40 mg total) by mouth daily at 6 PM.   carvedilol 25 MG tablet Commonly known as:  COREG Take 1 tablet (25 mg total) by mouth 2 (two) times daily with a meal.   glipiZIDE 10 MG tablet Commonly known as:  GLUCOTROL Take 1 tablet (10 mg total) by mouth 2 (two) times daily before a meal.   hydrALAZINE 100 MG tablet Commonly known as:  APRESOLINE Take 1 tablet (100 mg total) by mouth every 8 (eight) hours.   Insulin Glargine 100 UNIT/ML Solostar Pen Commonly known as:  LANTUS SOLOSTAR Inject 10 Units into the skin daily at 10 pm. What changed:  when to take this   Insulin Pen Needle 31G X 8 MM Misc Use as directed.   irbesartan 150 MG tablet Commonly known as:  AVAPRO Take 1 tablet (150 mg total) by mouth daily.   metFORMIN 1000 MG tablet Commonly known as:  GLUCOPHAGE Take 1 tablet (1,000 mg total) by mouth 2 (two) times daily with a meal.   sertraline 100 MG tablet Commonly known as:  ZOLOFT Take 1 tablet (100 mg total) by mouth daily.   spironolactone 25 MG tablet Commonly known as:  ALDACTONE Take 1 tablet (25 mg total) by mouth daily.      Follow-up Information    Marcine Matar, MD Follow up.   Specialty:  Internal Medicine Why:  Pt has scheduled appointment that was scheduled via office for 04-13-18 @ 11:00 am. If pt needs a sooner appointment- pt is aware to call the office. Medications will range  from $4.00-$10.00 Contact information: [address] [place] [address] [phone]        Runell Gess, MD Follow up in 1 week(s).   Specialties:  Cardiology, Radiology Contact information: [address] [place] [address] [phone]          Allergies  Allergen Reactions  . Imdur [Isosorbide Dinitrate] Other (See Comments)    Headaches   . Penicillins Other (See Comments)    From childhood; reaction not known: Has patient had a PCN reaction causing immediate rash, facial/tongue/throat swelling, SOB or lightheadedness with hypotension: Unk Has patient had a PCN reaction causing severe rash involving mucus membranes or skin necrosis: Unk Has patient had a PCN reaction that required hospitalization: Unk Has patient had a PCN reaction occurring within the last 10 years: No If all of the above answers are "NO", then may proceed with Cephalosporin use.     Consultations:  Cardiology    Procedures/Studies: Dg  Chest 2 View  Result Date: 12/29/2017 CLINICAL DATA:  Left-sided chest pain and shortness of breath. EXAM: CHEST - 2 VIEW COMPARISON:  Chest x-ray dated November 26, 2017. FINDINGS: Stable borderline cardiomegaly. Normal pulmonary vascularity. Mild subsegmental atelectasis in the right lower lobe. No focal consolidation, pleural effusion, or pneumothorax. No acute osseous abnormality. IMPRESSION: No active cardiopulmonary disease. Electronically Signed   By: Obie Dredge M.D.   On: 12/29/2017 12:32   Ct Head Wo Contrast  Result Date: 12/30/2017 CLINICAL DATA:  Acute severe worst headache of life. EXAM: CT HEAD WITHOUT CONTRAST TECHNIQUE: Contiguous axial images were obtained from the base of the skull through the vertex without intravenous contrast. COMPARISON:  12/13/2006 FINDINGS: Brain: The brain shows a normal appearance without evidence of malformation, atrophy, old or acute small or large vessel infarction, mass lesion, hemorrhage,  hydrocephalus or extra-axial collection. Vascular: No hyperdense vessel. No evidence of atherosclerotic calcification. Skull: Normal.  No traumatic finding.  No focal bone lesion. Sinuses/Orbits: Sinuses are clear. Orbits appear normal. Mastoids are clear. Other: None significant IMPRESSION: Normal head CT Electronically Signed   By: Paulina Fusi M.D.   On: 12/30/2017 08:59   Ct Angio Chest Pe W Or Wo Contrast  Result Date: 12/30/2017 CLINICAL DATA:  Shortness of breath EXAM: CT ANGIOGRAPHY CHEST WITH CONTRAST TECHNIQUE: Multidetector CT imaging of the chest was performed using the standard protocol during bolus administration of intravenous contrast. Multiplanar CT image reconstructions and MIPs were obtained to evaluate the vascular anatomy. CONTRAST:  ISOVUE-370 IOPAMIDOL (ISOVUE-370) INJECTION 76% COMPARISON:  November 01, 2015 FINDINGS: Cardiovascular: Satisfactory opacification of the pulmonary arteries to the segmental level. No evidence of pulmonary embolism. Normal heart size. No pericardial effusion. Mediastinum/Nodes: No enlarged mediastinal, hilar, or axillary lymph nodes. Thyroid gland, trachea, and esophagus demonstrate no significant findings. Lungs/Pleura: No focal pneumonia pleural effusion or pulmonary edema is identified. Hazy patchy ground-glass opacity is noted in both lungs. This is nonspecific, this can be seen in viral infection. Upper Abdomen: No acute abnormality. Musculoskeletal: No chest wall abnormality. No acute or significant osseous findings. Review of the MIP images confirms the above findings. IMPRESSION: No pulmonary embolus. No focal pneumonia. Hazy patchy ground-glass opacity in both lungs. This is nonspecific, this can be seen in viral infection. Electronically Signed   By: Sherian Rein M.D.   On: 12/30/2017 12:52    (Echo, Carotid, EGD, Colonoscopy, ERCP)    Subjective:   Discharge Exam: Vitals:   12/30/17 2031 12/31/17 0607  BP: 121/75 117/76  Pulse: 69 66   Resp: 18 18  Temp:  97.9 F (36.6 C)  SpO2: 99% 96%   Vitals:   12/30/17 1430 12/30/17 1825 12/30/17 2031 12/31/17 0607  BP:  125/76 121/75 117/76  Pulse:   69 66  Resp:   18 18  Temp: 98.9 F (37.2 C)   97.9 F (36.6 C)  TempSrc: Oral  Oral Oral  SpO2: 95%  99% 96%  Weight:    (!) 180.7 kg (398 lb 6.4 oz)  Height:        General: Pt is alert, awake, not in acute distress Cardiovascular: RRR, S1/S2 +, no rubs, no gallops Respiratory: CTA bilaterally, no wheezing, no rhonchi Abdominal: Soft, NT, ND, bowel sounds + Extremities: no edema, no cyanosis    The results of significant diagnostics from this hospitalization (including imaging, microbiology, ancillary and laboratory) are listed below for reference.     Microbiology: Recent Results (from the past 240 hour(s))  Respiratory  Panel by PCR     Status: None   Collection Time: 12/30/17  7:06 PM  Result Value Ref Range Status   Adenovirus NOT DETECTED NOT DETECTED Final   Coronavirus 229E NOT DETECTED NOT DETECTED Final   Coronavirus HKU1 NOT DETECTED NOT DETECTED Final   Coronavirus NL63 NOT DETECTED NOT DETECTED Final   Coronavirus OC43 NOT DETECTED NOT DETECTED Final   Metapneumovirus NOT DETECTED NOT DETECTED Final   Rhinovirus / Enterovirus NOT DETECTED NOT DETECTED Final   Influenza A NOT DETECTED NOT DETECTED Final   Influenza B NOT DETECTED NOT DETECTED Final   Parainfluenza Virus 1 NOT DETECTED NOT DETECTED Final   Parainfluenza Virus 2 NOT DETECTED NOT DETECTED Final   Parainfluenza Virus 3 NOT DETECTED NOT DETECTED Final   Parainfluenza Virus 4 NOT DETECTED NOT DETECTED Final   Respiratory Syncytial Virus NOT DETECTED NOT DETECTED Final   Bordetella pertussis NOT DETECTED NOT DETECTED Final   Chlamydophila pneumoniae NOT DETECTED NOT DETECTED Final   Mycoplasma pneumoniae NOT DETECTED NOT DETECTED Final    Comment: Performed at [facility], [address], [place], [place] [address]      Labs: BNP (last 3 results) Recent Labs    09/09/17 1547 12/30/17 0639  BNP 137.2* 18.5   Basic Metabolic Panel: Recent Labs  Lab 12/29/17 1139 12/30/17 0121  NA 139  --   K 4.1  --   CL 104  --   CO2 25  --   GLUCOSE 142*  --   BUN 16  --   CREATININE 1.26* 1.07  CALCIUM 9.6  --    Liver Function Tests: Recent Labs  Lab 12/30/17 0121  AST 23  ALT 25  ALKPHOS 91  BILITOT 1.2  PROT 7.5  ALBUMIN 4.2   Recent Labs  Lab 12/30/17 0121  LIPASE 25   No results for input(s): AMMONIA in the last 168 hours. CBC: Recent Labs  Lab 12/29/17 1139 12/30/17 0121  WBC 8.0 8.0  HGB 13.3 13.6  HCT 38.2* 39.3  MCV 70.6* 70.4*  PLT 259 250   Cardiac Enzymes: Recent Labs  Lab 12/30/17 0121 12/30/17 0639 12/30/17 1359  TROPONINI <0.03 <0.03 <0.03   BNP: Invalid input(s): POCBNP CBG: Recent Labs  Lab 12/30/17 0728 12/30/17 1128 12/30/17 1630 12/30/17 2120 12/31/17 0746  GLUCAP 99 84 99 162* 116*   D-Dimer Recent Labs    12/30/17 0121  DDIMER 0.66*   Hgb A1c No results for input(s): HGBA1C in the last 72 hours. Lipid Profile No results for input(s): CHOL, HDL, LDLCALC, TRIG, CHOLHDL, LDLDIRECT in the last 72 hours. Thyroid function studies No results for input(s): TSH, T4TOTAL, T3FREE, THYROIDAB in the last 72 hours.  Invalid input(s): FREET3 Anemia work up No results for input(s): VITAMINB12, FOLATE, FERRITIN, TIBC, IRON, RETICCTPCT in the last 72 hours. Urinalysis    Component Value Date/Time   COLORURINE STRAW (A) 11/26/2017 1723   APPEARANCEUR CLEAR 11/26/2017 1723   LABSPEC 1.026 11/26/2017 1723   PHURINE 6.0 11/26/2017 1723   GLUCOSEU >=500 (A) 11/26/2017 1723   HGBUR NEGATIVE 11/26/2017 1723   BILIRUBINUR NEGATIVE 11/26/2017 1723   KETONESUR 20 (A) 11/26/2017 1723   PROTEINUR NEGATIVE 11/26/2017 1723   NITRITE NEGATIVE 11/26/2017 1723   LEUKOCYTESUR NEGATIVE 11/26/2017 1723   Sepsis Labs Invalid input(s): PROCALCITONIN,  WBC,   LACTICIDVEN Microbiology Recent Results (from the past 240 hour(s))  Respiratory Panel by PCR     Status: None   Collection Time:  12/30/17  7:06 PM  Result Value Ref Range Status   Adenovirus NOT DETECTED NOT DETECTED Final   Coronavirus 229E NOT DETECTED NOT DETECTED Final   Coronavirus HKU1 NOT DETECTED NOT DETECTED Final   Coronavirus NL63 NOT DETECTED NOT DETECTED Final   Coronavirus OC43 NOT DETECTED NOT DETECTED Final   Metapneumovirus NOT DETECTED NOT DETECTED Final   Rhinovirus / Enterovirus NOT DETECTED NOT DETECTED Final   Influenza A NOT DETECTED NOT DETECTED Final   Influenza B NOT DETECTED NOT DETECTED Final   Parainfluenza Virus 1 NOT DETECTED NOT DETECTED Final   Parainfluenza Virus 2 NOT DETECTED NOT DETECTED Final   Parainfluenza Virus 3 NOT DETECTED NOT DETECTED Final   Parainfluenza Virus 4 NOT DETECTED NOT DETECTED Final   Respiratory Syncytial Virus NOT DETECTED NOT DETECTED Final   Bordetella pertussis NOT DETECTED NOT DETECTED Final   Chlamydophila pneumoniae NOT DETECTED NOT DETECTED Final   Mycoplasma pneumoniae NOT DETECTED NOT DETECTED Final    Comment: Performed at [facility], [address], [place], [place] [address]     Time coordinating discharge: Over 30 minutes  SIGNED:   Alba Cory, MD  [facility] 12/31/2017, 9:49 AM Pager   If 7PM-7AM, please contact night-coverage [email or web address] Password [email or web address]

## 2018-01-01 ENCOUNTER — Ambulatory Visit: Payer: Self-pay | Admitting: Internal Medicine

## 2018-01-05 ENCOUNTER — Telehealth: Payer: Self-pay | Admitting: Cardiology

## 2018-01-05 MED FILL — $LANTUS SOLOSTAR 100 UNITS/: 100 | 28 days supply | Qty: 3 | Fill #1

## 2018-01-05 MED FILL — TRUEplus 5-BEVEL PEN NEEDLE: 31G X 8 MM | 30 days supply | Qty: 100 | Fill #0

## 2018-01-05 NOTE — Telephone Encounter (Signed)
TOC Patient- Please call Patient- Pt has an appointment with Corine Shelter on 01-16-18.

## 2018-01-08 NOTE — Telephone Encounter (Signed)
Patient contacted regarding discharge from [facility] on 12/31/17.  Patient understands to follow up with provider Corine Shelter, PA on 01/16/18 at 11 am at the [facility] office. Patient understands discharge instructions? Yes Patient understands medications and regiment? Yes Patient understands to bring all medications to this visit? Yes  Patient has been instructed to call back if he has any questions.

## 2018-01-16 ENCOUNTER — Ambulatory Visit: Payer: Self-pay | Admitting: Cardiology

## 2018-01-23 ENCOUNTER — Ambulatory Visit: Payer: Self-pay | Admitting: Internal Medicine

## 2018-01-27 ENCOUNTER — Encounter (HOSPITAL_COMMUNITY): Payer: Self-pay | Admitting: Emergency Medicine

## 2018-01-27 ENCOUNTER — Emergency Department (HOSPITAL_COMMUNITY)
Admission: EM | Admit: 2018-01-27 | Discharge: 2018-01-28 | Disposition: A | Discharge status: Home / Self Care | Payer: Medicaid Other | Attending: Emergency Medicine | Admitting: Emergency Medicine

## 2018-01-27 DIAGNOSIS — I5042 Chronic combined systolic (congestive) and diastolic (congestive) heart failure: Secondary | ICD-10-CM | POA: Insufficient documentation

## 2018-01-27 DIAGNOSIS — I110 Hypertensive heart disease with heart failure: Secondary | ICD-10-CM | POA: Insufficient documentation

## 2018-01-27 DIAGNOSIS — R55 Syncope and collapse: Secondary | ICD-10-CM | POA: Diagnosis present

## 2018-01-27 DIAGNOSIS — E119 Type 2 diabetes mellitus without complications: Secondary | ICD-10-CM | POA: Insufficient documentation

## 2018-01-27 DIAGNOSIS — Z794 Long term (current) use of insulin: Secondary | ICD-10-CM | POA: Insufficient documentation

## 2018-01-27 DIAGNOSIS — Z7982 Long term (current) use of aspirin: Secondary | ICD-10-CM | POA: Diagnosis not present

## 2018-01-27 DIAGNOSIS — E860 Dehydration: Secondary | ICD-10-CM | POA: Diagnosis not present

## 2018-01-27 LAB — CBC
HEMATOCRIT: 39 % (ref 39.0–52.0)
Hemoglobin: 12.8 g/dL — ABNORMAL LOW (ref 13.0–17.0)
MCH: 23.4 pg — ABNORMAL LOW (ref 26.0–34.0)
MCHC: 32.8 g/dL (ref 30.0–36.0)
MCV: 71.4 fL — AB (ref 78.0–100.0)
Platelets: 262 10*3/uL (ref 150–400)
RBC: 5.46 MIL/uL (ref 4.22–5.81)
RDW: 14.6 % (ref 11.5–15.5)
WBC: 9.6 10*3/uL (ref 4.0–10.5)

## 2018-01-27 LAB — BASIC METABOLIC PANEL
Anion gap: 10 (ref 5–15)
BUN: 11 mg/dL (ref 6–20)
CHLORIDE: 102 mmol/L (ref 101–111)
CO2: 26 mmol/L (ref 22–32)
Calcium: 9.3 mg/dL (ref 8.9–10.3)
Creatinine, Ser: 1.35 mg/dL — ABNORMAL HIGH (ref 0.61–1.24)
GFR calc Af Amer: >60 mL/min (ref >60)
GFR calc non Af Amer: >60 mL/min (ref >60)
GLUCOSE: 75 mg/dL (ref 65–99)
POTASSIUM: 3.8 mmol/L (ref 3.5–5.1)
SODIUM: 138 mmol/L (ref 135–145)

## 2018-01-27 LAB — CBG MONITORING, ED: Glucose-Capillary: 70 mg/dL (ref 65–99)

## 2018-01-27 MED ORDER — ACETAMINOPHEN 325 MG PO TABS
650.0000 mg | ORAL_TABLET | Freq: Once | ORAL | Status: AC
  Administered 2018-01-27: 650 mg via ORAL
  Filled 2018-01-27: qty 2

## 2018-01-27 NOTE — ED Triage Notes (Signed)
Pt arrives with dizziness, headache, blurry vision and leg pain ongoing, seen approx 3 weeks ago for same. Reports syncopal episode today. Hypertensive and fever in triage. Hx CHF, DM

## 2018-01-28 LAB — URINALYSIS, ROUTINE W REFLEX MICROSCOPIC
Bacteria, UA: NONE SEEN
Glucose, UA: NEGATIVE mg/dL
Hgb urine dipstick: NEGATIVE
Ketones, ur: 5 mg/dL — AB
Leukocytes, UA: NEGATIVE
Nitrite: NEGATIVE
Protein, ur: >=300 mg/dL — AB
SPECIFIC GRAVITY, URINE: 1.04 — AB (ref 1.005–1.030)
pH: 5 (ref 5.0–8.0)

## 2018-01-28 MED ORDER — SODIUM CHLORIDE 0.9 % IV BOLUS
500.0000 mL | Freq: Once | INTRAVENOUS | Status: AC
  Administered 2018-01-28: 500 mL via INTRAVENOUS

## 2018-01-28 NOTE — ED Notes (Signed)
Ambulated pt around the nurses station. Pt states he is not dizzy not lightheaded.

## 2018-01-28 NOTE — Discharge Instructions (Addendum)
You were seen today after passing out dizziness.  Make sure to stay hydrated.  There is evidence that you may have been dehydrated which can sometimes lead to dizziness and syncope.  Follow-up closely with your cardiologist.

## 2018-01-28 NOTE — ED Provider Notes (Signed)
[facility] EMERGENCY DEPARTMENT Provider Note   CSN: [id number] Arrival date & time: 01/27/18  2212     History   Chief Complaint Chief Complaint  Patient presents with  . Loss of Consciousness    HPI Paul Lamb is a 32 y.o. male.  HPI  This is a 32 year old male with a history of diastolic heart failure, diabetes, hypertension who presents with dizziness and syncope.  Patient reports several week history of both dizziness described as room spinning and lightheadedness.  It is worse with position change.  Patient states that he got up to stand today, felt dizzy, and "passed out."  Upon awakening he was awake, alert, and oriented.  No seizure-like activity was noted.  He denies any shortness of breath or chest pain.  Denies any history of syncope.  Patient does report over the last several weeks that he has had intermittent headaches.  He denies headache at this time.  Denies any recent illnesses.  Did have a low-grade temperature of 100.4 upon arrival but denies any infectious symptoms including cough, sore throat.  Past Medical History:  Diagnosis Date  . Acute combined systolic and diastolic CHF, NYHA class 3 (HCC) 11/18/2016   EF now 40-45% by echo  . Acute diastolic (congestive) heart failure (HCC) 01/12/2016  . Diabetes mellitus, new onset (HCC)   . Hypertension   . Morbid obesity (HCC)   . OSA (obstructive sleep apnea)     Patient Active Problem List   Diagnosis Date Noted  . Chest pain 12/29/2017  . Diabetes mellitus type 2 in obese (HCC) 12/29/2017  . Dietary noncompliance 11/28/2017  . Chronic diastolic CHF (congestive heart failure) (HCC) 11/28/2017  . ARF (acute renal failure) (HCC) 11/26/2017  . Diabetic ketoacidosis without coma associated with type 2 diabetes mellitus (HCC)   . Diabetes mellitus, new onset (HCC)   . Proteinuria   . Elevated troponin   . Chest pressure   . Erectile dysfunction 05/19/2017  . Depression 05/19/2017  .  Sleep apnea 01/12/2016  . Cardiomegaly - hypertensive   . Abnormal EKG   . Hypertensive emergency 11/01/2015  . Hand pain, left 11/01/2015  . Morbid obesity, unspecified obesity type (HCC) 11/01/2015    Past Surgical History:  Procedure Laterality Date  . NO PAST SURGERIES          Home Medications    Prior to Admission medications   Medication Sig Start Date End Date Taking? Authorizing Provider  amLODipine (NORVASC) 10 MG tablet Take 1 tablet (10 mg total) by mouth daily. 12/12/17   Marcine Matar, MD  aspirin 81 MG EC tablet Take 1 tablet (81 mg total) by mouth daily. 12/12/17   Marcine Matar, MD  atorvastatin (LIPITOR) 80 MG tablet Take 0.5 tablets (40 mg total) by mouth daily at 6 PM. 12/12/17   Marcine Matar, MD  carvedilol (COREG) 25 MG tablet Take 1 tablet (25 mg total) by mouth 2 (two) times daily with a meal. 12/12/17   Marcine Matar, MD  glipiZIDE (GLUCOTROL) 10 MG tablet Take 1 tablet (10 mg total) by mouth 2 (two) times daily before a meal. 12/12/17   Marcine Matar, MD  hydrALAZINE (APRESOLINE) 100 MG tablet Take 1 tablet (100 mg total) by mouth every 8 (eight) hours. 12/12/17   Marcine Matar, MD  Insulin Glargine (LANTUS SOLOSTAR) 100 UNIT/ML Solostar Pen Inject 10 Units into the skin daily at 10 pm. Patient taking differently: Inject 10 Units  into the skin at bedtime.  12/12/17   Marcine Matar, MD  Insulin Pen Needle 31G X 8 MM MISC Use as directed. 12/12/17   Marcine Matar, MD  irbesartan (AVAPRO) 150 MG tablet Take 1 tablet (150 mg total) by mouth daily. 12/12/17   Marcine Matar, MD  metFORMIN (GLUCOPHAGE) 1000 MG tablet Take 1 tablet (1,000 mg total) by mouth 2 (two) times daily with a meal. 12/12/17   Marcine Matar, MD  sertraline (ZOLOFT) 100 MG tablet Take 1 tablet (100 mg total) by mouth daily. 12/12/17   Marcine Matar, MD  spironolactone (ALDACTONE) 25 MG tablet Take 1 tablet (25 mg total) by mouth daily. 12/12/17    Marcine Matar, MD    Family History Family History  Problem Relation Age of Onset  . Hypertension Unknown   . Diabetes Mellitus II Unknown     Social History Social History   Tobacco Use  . Smoking status: Never Smoker  . Smokeless tobacco: Never Used  Substance Use Topics  . Alcohol use: Yes    Alcohol/week: 0.0 oz  . Drug use: No     Allergies   Imdur [isosorbide dinitrate] and Penicillins   Review of Systems Review of Systems  Constitutional: Positive for fever.  Respiratory: Negative for shortness of breath.   Cardiovascular: Negative for chest pain.  Gastrointestinal: Negative for abdominal pain, nausea and vomiting.  Genitourinary: Negative for flank pain.  Neurological: Positive for dizziness, syncope, light-headedness and headaches. Negative for speech difficulty, weakness and numbness.  All other systems reviewed and are negative.    Physical Exam Updated Vital Signs BP 136/90 (BP Location: Left Arm)   Pulse 70   Temp 98.3 F (36.8 C) (Oral)   Resp 17   Ht 6\' 2"  (1.88 m)   Wt (!) 158.8 kg (350 lb)   SpO2 96%   BMI 44.94 kg/m   Physical Exam  Constitutional: He is oriented to person, place, and time. He appears well-developed and well-nourished.  Obese, no acute distress  HENT:  Head: Normocephalic and atraumatic.  Eyes: Pupils are equal, round, and reactive to light.  Neck: Normal range of motion. Neck supple.  Cardiovascular: Normal rate, regular rhythm and normal heart sounds.  No murmur heard. Pulmonary/Chest: Effort normal and breath sounds normal. No respiratory distress. He has no wheezes.  Abdominal: Soft. Bowel sounds are normal. There is no tenderness. There is no rebound.  Musculoskeletal: He exhibits edema.  Lymphadenopathy:    He has no cervical adenopathy.  Neurological: He is alert and oriented to person, place, and time.  Cranial nerves II through XII intact, 5 out of 5 strength in all 4 extremities, no dysmetria to  finger-nose-finger  Skin: Skin is warm and dry.  Psychiatric: He has a normal mood and affect.  Nursing note and vitals reviewed.    ED Treatments / Results  Labs (all labs ordered are listed, but only abnormal results are displayed) Labs Reviewed  BASIC METABOLIC PANEL - Abnormal; Notable for the following components:      Result Value   Creatinine, Ser 1.35 (*)    All other components within normal limits  CBC - Abnormal; Notable for the following components:   Hemoglobin 12.8 (*)    MCV 71.4 (*)    MCH 23.4 (*)    All other components within normal limits  URINALYSIS, ROUTINE W REFLEX MICROSCOPIC - Abnormal; Notable for the following components:   Color, Urine AMBER (*)  APPearance HAZY (*)    Specific Gravity, Urine 1.040 (*)    Bilirubin Urine MODERATE (*)    Ketones, ur 5 (*)    Protein, ur >=300 (*)    All other components within normal limits  CBG MONITORING, ED    EKG EKG Interpretation  Date/Time:  Saturday January 27 2018 22:20:47 EDT Ventricular Rate:  91 PR Interval:  156 QRS Duration: 90 QT Interval:  370 QTC Calculation: 455 R Axis:   34 Text Interpretation:  Normal sinus rhythm Nonspecific T wave abnormality Abnormal ECG Confirmed by Ross Marcus ([id number]) on 01/28/2018 1:28:51 AM   Radiology No results found.  Procedures Procedures (including critical care time)  Medications Ordered in ED Medications  acetaminophen (TYLENOL) tablet 650 mg (650 mg Oral Given 01/27/18 2235)  sodium chloride 0.9 % bolus 500 mL (0 mLs Intravenous Stopped 01/28/18 0412)     Initial Impression / Assessment and Plan / ED Course  I have reviewed the triage vital signs and the nursing notes.  Pertinent labs & imaging results that were available during my care of the patient were reviewed by me and considered in my medical decision making (see chart for details).    Patient presents with several week history of dizziness.  He had a syncopal episode tonight.  He is  overall nontoxic-appearing vital signs are reassuring.  He does report a prodrome of dizziness prior to syncope.  He is neurologically intact.  Considerations include vertigo, dehydration, less likely cardiogenic syncope given prodrome.  EKG is reassuring without evidence of arrhythmia.  Urinalysis with 5 ketones and orthostatics positive with increase in heart rate by 24.  Patient was given 500 cc bolus.  Most recent echo on chart review had an EF of 60%.  Otherwise his lab work-up is largely reassuring.  On recheck, he states he feels better.  He is able to ambulate without difficulty or dizziness.  Recommend close follow-up with cardiology.  Suspect syncope related to dehydration and orthostasis.  Low suspicion at this time for cardiac or neurologic etiology.  After history, exam, and medical workup I feel the patient has been appropriately medically screened and is safe for discharge home. Pertinent diagnoses were discussed with the patient. Patient was given return precautions.   Final Clinical Impressions(s) / ED Diagnoses   Final diagnoses:  Dehydration  Vasovagal syncope    ED Discharge Orders    None       Shon Baton, MD 01/28/18 [phone]

## 2018-02-06 ENCOUNTER — Ambulatory Visit (INDEPENDENT_AMBULATORY_CARE_PROVIDER_SITE_OTHER): Payer: Medicaid Other | Admitting: Cardiology

## 2018-02-06 ENCOUNTER — Encounter: Payer: Self-pay | Admitting: Cardiology

## 2018-02-06 DIAGNOSIS — E669 Obesity, unspecified: Secondary | ICD-10-CM | POA: Diagnosis not present

## 2018-02-06 DIAGNOSIS — E1169 Type 2 diabetes mellitus with other specified complication: Secondary | ICD-10-CM | POA: Diagnosis not present

## 2018-02-06 DIAGNOSIS — E785 Hyperlipidemia, unspecified: Secondary | ICD-10-CM | POA: Diagnosis not present

## 2018-02-06 DIAGNOSIS — R42 Dizziness and giddiness: Secondary | ICD-10-CM | POA: Diagnosis not present

## 2018-02-06 DIAGNOSIS — I10 Essential (primary) hypertension: Secondary | ICD-10-CM | POA: Diagnosis not present

## 2018-02-06 DIAGNOSIS — I5032 Chronic diastolic (congestive) heart failure: Secondary | ICD-10-CM | POA: Diagnosis not present

## 2018-02-06 MED ORDER — CARVEDILOL 25 MG PO TABS: 25.0000 mg | ORAL_TABLET | Freq: Two times a day (BID) | ORAL | 3 refills | Status: DC

## 2018-02-06 MED ORDER — AMLODIPINE BESYLATE 10 MG PO TABS: 10.0000 mg | ORAL_TABLET | Freq: Every day | ORAL | 3 refills | Status: DC

## 2018-02-06 MED ORDER — SPIRONOLACTONE 25 MG PO TABS: 25.0000 mg | ORAL_TABLET | Freq: Every day | ORAL | 3 refills | Status: DC

## 2018-02-06 MED ORDER — ATORVASTATIN CALCIUM 40 MG PO TABS: 40.0000 mg | ORAL_TABLET | Freq: Every day | ORAL | 3 refills | Status: DC

## 2018-02-06 MED ORDER — HYDRALAZINE HCL 50 MG PO TABS: 50.0000 mg | ORAL_TABLET | Freq: Three times a day (TID) | ORAL | 2 refills | Status: DC

## 2018-02-06 MED ORDER — IRBESARTAN 150 MG PO TABS: 150.0000 mg | ORAL_TABLET | Freq: Every day | ORAL | 3 refills | Status: DC

## 2018-02-06 NOTE — Assessment & Plan Note (Signed)
BMI 50- his weight has been trending down.

## 2018-02-06 NOTE — Assessment & Plan Note (Signed)
On oral agents 

## 2018-02-06 NOTE — Assessment & Plan Note (Signed)
I will decrease his Hydralazine and change his Avapro to Q HS. I'll f/u in a week. If his symptoms are improved we'll consider stopping this and replacing it with Cardura. We could also consider chlorthalidone.

## 2018-02-06 NOTE — Patient Instructions (Signed)
Medication Instructions:  TAKE Avapro daily at bedtime DECREASE Hydralazine 50mg  take 1 tablet every 8 hours  Labwork: None    Testing/Procedures: None   Follow-Up: Your provider recommends that you schedule a follow-up appointment in: 1 week with Corine Shelter  Any Other Special Instructions Will Be Listed Below (If Applicable).  If you need a refill on your cardiac medications before your next appointment, please call your pharmacy.

## 2018-02-06 NOTE — Progress Notes (Signed)
02/06/2018 [facility]   04/25/1986  [id number]  Primary Physician Marcine Matar, MD Primary Cardiologist: Dr Allyson Sabal  HPI:  31 y/.o. obese AA male with PMH of chronic diastolic CHF (EF 82-64% by echo in May 2019), HTN, and OSA (on CPAP). His BMI is 50 but he has gradually been dropping weight. He was recently admitted in May 2019 with chest pain felt to be non cardiac. He is in the office today for follow up. He says his main problem is dizziness after bending over. He says at time he actually get confused when this happens. He has not had syncope. His CHF appears stable. He is on multiple medications and felt they were interacting.     Current Outpatient Medications  Medication Sig Dispense Refill  . amLODipine (NORVASC) 10 MG tablet Take 1 tablet (10 mg total) by mouth daily. 90 tablet 3  . aspirin 81 MG EC tablet Take 1 tablet (81 mg total) by mouth daily. 100 tablet 1  . atorvastatin (LIPITOR) 80 MG tablet Take 0.5 tablets (40 mg total) by mouth daily at 6 PM. 90 tablet 2  . carvedilol (COREG) 25 MG tablet Take 1 tablet (25 mg total) by mouth 2 (two) times daily with a meal. 120 tablet 3  . glipiZIDE (GLUCOTROL) 10 MG tablet Take 1 tablet (10 mg total) by mouth 2 (two) times daily before a meal. 60 tablet 6  . Insulin Glargine (LANTUS SOLOSTAR) 100 UNIT/ML Solostar Pen Inject 10 Units into the skin daily at 10 pm. (Patient taking differently: Inject 10 Units into the skin at bedtime. ) 5 pen PRN  . Insulin Pen Needle 31G X 8 MM MISC Use as directed. 100 each 4  . irbesartan (AVAPRO) 150 MG tablet Take 1 tablet (150 mg total) by mouth daily. 90 tablet 3  . metFORMIN (GLUCOPHAGE) 1000 MG tablet Take 1 tablet (1,000 mg total) by mouth 2 (two) times daily with a meal. 60 tablet 5  . sertraline (ZOLOFT) 100 MG tablet Take 1 tablet (100 mg total) by mouth daily. 30 tablet 6  . spironolactone (ALDACTONE) 25 MG tablet Take 1 tablet (25 mg total) by mouth daily. 90 tablet 3  .  hydrALAZINE (APRESOLINE) 50 MG tablet Take 1 tablet (50 mg total) by mouth 3 (three) times daily. 90 tablet 2   No current facility-administered medications for this visit.     Allergies  Allergen Reactions  . Imdur [Isosorbide Dinitrate] Other (See Comments)    Headaches   . Penicillins Other (See Comments)    From childhood; reaction not known: Has patient had a PCN reaction causing immediate rash, facial/tongue/throat swelling, SOB or lightheadedness with hypotension: Unk Has patient had a PCN reaction causing severe rash involving mucus membranes or skin necrosis: Unk Has patient had a PCN reaction that required hospitalization: Unk Has patient had a PCN reaction occurring within the last 10 years: No If all of the above answers are "NO", then may proceed with Cephalosporin use.     Past Medical History:  Diagnosis Date  . Acute combined systolic and diastolic CHF, NYHA class 3 (HCC) 11/18/2016   EF now 40-45% by echo  . Acute diastolic (congestive) heart failure (HCC) 01/12/2016  . Diabetes mellitus, new onset (HCC)   . Hypertension   . Morbid obesity (HCC)   . OSA (obstructive sleep apnea)     Social History   Socioeconomic History  . Marital status: Single    Spouse name: Not on  file  . Number of children: Not on file  . Years of education: Not on file  . Highest education level: Not on file  Occupational History  . Not on file  Social Needs  . Financial resource strain: Not on file  . Food insecurity:    Worry: Not on file    Inability: Not on file  . Transportation needs:    Medical: Not on file    Non-medical: Not on file  Tobacco Use  . Smoking status: Never Smoker  . Smokeless tobacco: Never Used  Substance and Sexual Activity  . Alcohol use: Yes    Alcohol/week: 0.0 oz  . Drug use: No  . Sexual activity: Yes  Lifestyle  . Physical activity:    Days per week: 0 days    Minutes per session: 0 min  . Stress: To some extent  Relationships  .  Social connections:    Talks on phone: Patient refused    Gets together: Patient refused    Attends religious service: Patient refused    Active member of club or organization: Patient refused    Attends meetings of clubs or organizations: Patient refused    Relationship status: Patient refused  . Intimate partner violence:    Fear of current or ex partner: No    Emotionally abused: No    Physically abused: No    Forced sexual activity: No  Other Topics Concern  . Not on file  Social History Narrative  . Not on file     Family History  Problem Relation Age of Onset  . Hypertension Unknown   . Diabetes Mellitus II Unknown      Review of Systems: General: negative for chills, fever, night sweats or weight changes.  Cardiovascular: negative for chest pain, dyspnea on exertion, edema, orthopnea, palpitations, paroxysmal nocturnal dyspnea or shortness of breath Dermatological: negative for rash Respiratory: negative for cough or wheezing Urologic: negative for hematuria Abdominal: negative for nausea, vomiting, diarrhea, bright red blood per rectum, melena, or hematemesis Neurologic: negative for visual changes, syncope, or dizziness All other systems reviewed and are otherwise negative except as noted above.    Blood pressure (!) 144/95, pulse 82, height  (1.88 m), weight (!) 397 lb (180.1 kg).  General appearance: alert, cooperative, no distress and morbidly obese Neck: no carotid bruit and no JVD Lungs: clear to auscultation bilaterally Extremities: extremities normal, atraumatic, no cyanosis or edema Skin: Skin color, texture, turgor normal. No rashes or lesions Neurologic: Grossly normal  Cardiac: RRR without murmur   ASSESSMENT AND PLAN:   Dizziness Pt complains of orthostatic dizziness and confusion- he attributes this to one of his medications  Chronic diastolic CHF (congestive heart failure) (HCC) Currently stable volume wise  Essential hypertension I  will decrease his Hydralazine and change his Avapro to Q HS. I'll f/u in a week. If his symptoms are improved we'll consider stopping this and replacing it with Cardura. We could also consider chlorthalidone.   Morbid obesity (HCC) BMI 50- his weight has been trending down.   Diabetes mellitus type 2 in obese (HCC) On oral agents   PLAN  I suggested we decrease his Hydralazine and try taking his Avapro Q HS. I'll see him back in a week and consider changing Hydralazine to another agent.   Corine Shelter PA-C 02/06/2018 10:12 AM

## 2018-02-06 NOTE — Assessment & Plan Note (Signed)
Currently stable volume wise

## 2018-02-06 NOTE — Assessment & Plan Note (Signed)
Pt complains of orthostatic dizziness and confusion- he attributes this to one of his medications

## 2018-02-07 ENCOUNTER — Other Ambulatory Visit: Payer: Self-pay

## 2018-02-07 ENCOUNTER — Encounter (HOSPITAL_COMMUNITY): Payer: Self-pay

## 2018-02-07 ENCOUNTER — Emergency Department (HOSPITAL_COMMUNITY): Payer: Medicaid Other

## 2018-02-07 ENCOUNTER — Emergency Department (HOSPITAL_COMMUNITY)
Admission: EM | Admit: 2018-02-07 | Discharge: 2018-02-07 | Disposition: A | Discharge status: Home / Self Care | Payer: Medicaid Other | Attending: Emergency Medicine | Admitting: Emergency Medicine

## 2018-02-07 DIAGNOSIS — Z794 Long term (current) use of insulin: Secondary | ICD-10-CM | POA: Insufficient documentation

## 2018-02-07 DIAGNOSIS — Z79899 Other long term (current) drug therapy: Secondary | ICD-10-CM | POA: Insufficient documentation

## 2018-02-07 DIAGNOSIS — E119 Type 2 diabetes mellitus without complications: Secondary | ICD-10-CM | POA: Insufficient documentation

## 2018-02-07 DIAGNOSIS — M792 Neuralgia and neuritis, unspecified: Secondary | ICD-10-CM

## 2018-02-07 DIAGNOSIS — R42 Dizziness and giddiness: Secondary | ICD-10-CM | POA: Insufficient documentation

## 2018-02-07 DIAGNOSIS — I110 Hypertensive heart disease with heart failure: Secondary | ICD-10-CM | POA: Diagnosis not present

## 2018-02-07 DIAGNOSIS — I5032 Chronic diastolic (congestive) heart failure: Secondary | ICD-10-CM | POA: Diagnosis not present

## 2018-02-07 DIAGNOSIS — E1140 Type 2 diabetes mellitus with diabetic neuropathy, unspecified: Secondary | ICD-10-CM | POA: Insufficient documentation

## 2018-02-07 DIAGNOSIS — Z7982 Long term (current) use of aspirin: Secondary | ICD-10-CM | POA: Diagnosis not present

## 2018-02-07 DIAGNOSIS — R0789 Other chest pain: Secondary | ICD-10-CM | POA: Diagnosis not present

## 2018-02-07 DIAGNOSIS — R079 Chest pain, unspecified: Secondary | ICD-10-CM | POA: Diagnosis present

## 2018-02-07 LAB — I-STAT TROPONIN, ED: Troponin i, poc: 0 ng/mL (ref 0.00–0.08)

## 2018-02-07 LAB — CBC
HCT: 40.6 % (ref 39.0–52.0)
Hemoglobin: 13.5 g/dL (ref 13.0–17.0)
MCH: 24 pg — ABNORMAL LOW (ref 26.0–34.0)
MCHC: 33.3 g/dL (ref 30.0–36.0)
MCV: 72.2 fL — AB (ref 78.0–100.0)
PLATELETS: 281 10*3/uL (ref 150–400)
RBC: 5.62 MIL/uL (ref 4.22–5.81)
RDW: 14.7 % (ref 11.5–15.5)
WBC: 7.7 10*3/uL (ref 4.0–10.5)

## 2018-02-07 LAB — BASIC METABOLIC PANEL
Anion gap: 9 (ref 5–15)
BUN: 10 mg/dL (ref 6–20)
CO2: 26 mmol/L (ref 22–32)
CREATININE: 0.92 mg/dL (ref 0.61–1.24)
Calcium: 9.5 mg/dL (ref 8.9–10.3)
Chloride: 104 mmol/L (ref 101–111)
GFR calc Af Amer: >60 mL/min (ref >60)
Glucose, Bld: 83 mg/dL (ref 65–99)
Potassium: 3.6 mmol/L (ref 3.5–5.1)
SODIUM: 139 mmol/L (ref 135–145)

## 2018-02-07 LAB — D-DIMER, QUANTITATIVE (NOT AT ARMC): D DIMER QUANT: 0.41 ug{FEU}/mL (ref 0.00–0.50)

## 2018-02-07 LAB — BRAIN NATRIURETIC PEPTIDE: B Natriuretic Peptide: 62.9 pg/mL (ref 0.0–100.0)

## 2018-02-07 MED ORDER — GABAPENTIN 300 MG PO CAPS: 300.0000 mg | ORAL_CAPSULE | Freq: Two times a day (BID) | ORAL | 0 refills | Status: DC

## 2018-02-07 NOTE — ED Provider Notes (Signed)
[name] EMERGENCY DEPARTMENT Provider Note  CSN: [id number] Arrival date & time: 02/07/18  1228  History   Chief Complaint Chief Complaint  Patient presents with  . Chest Pain    HPI Paul Lamb is a 32 y.o. male with a medical history of HTN, Type 2 DM, OSA and CHF who presented to the ED for various complaints.   He complains of intermittent chest discomfort that occurs with exertion, but it does not happen daily. Discomfort is relieved with rest. He endorses left arm numbness yesterday with the chest pain at night, but both symptoms quickly resolved. Denies SOB, diaphoresis, palpitations, orthopnea, or leg swelling.  Also complains of bilateral leg pain x2 months. He describes it as burning, tingling and numbness. Patient states that he was told he had neuropathy, but is currently not taking any medication for it. Denies gait/coordination/balance issues, weakness or color/temperature changes in the extremity.   Patient also mentions intermittent lightheadedness that occurs when he is moving quickly from one position to another. Denies chest pain, SOB, palpitations or syncope with any of these episodes. Additional history obtained from medical chart. Patient seen in ED on 01/28/18 with similar complaints and was diagnosed with vasovagal syncope and positive orthostatic vital signs.  Past Medical History:  Diagnosis Date  . Acute combined systolic and diastolic CHF, NYHA class 3 (HCC) 11/18/2016   EF now 40-45% by echo  . Acute diastolic (congestive) heart failure (HCC) 01/12/2016  . Diabetes mellitus, new onset (HCC)   . Hypertension   . Morbid obesity (HCC)   . OSA (obstructive sleep apnea)     Patient Active Problem List   Diagnosis Date Noted  . Dizziness 02/06/2018  . Chest pain 12/29/2017  . Diabetes mellitus type 2 in obese (HCC) 12/29/2017  . Dietary noncompliance 11/28/2017  . Chronic diastolic CHF (congestive heart failure) (HCC) 11/28/2017  .  ARF (acute renal failure) (HCC) 11/26/2017  . Diabetic ketoacidosis without coma associated with type 2 diabetes mellitus (HCC)   . Diabetes mellitus, new onset (HCC)   . Proteinuria   . Elevated troponin   . Chest pressure   . Erectile dysfunction 05/19/2017  . Depression 05/19/2017  . Essential hypertension 01/12/2016  . Sleep apnea 01/12/2016  . Cardiomegaly - hypertensive   . Abnormal EKG   . Hypertensive emergency 11/01/2015  . Hand pain, left 11/01/2015  . Morbid obesity (HCC) 11/01/2015    Past Surgical History:  Procedure Laterality Date  . NO PAST SURGERIES          Home Medications    Prior to Admission medications   Medication Sig Start Date End Date Taking? Authorizing Provider  amLODipine (NORVASC) 10 MG tablet Take 1 tablet (10 mg total) by mouth daily. 02/06/18  Yes Kilroy, Eda Paschal, PA-C  aspirin 81 MG EC tablet Take 1 tablet (81 mg total) by mouth daily. 12/12/17  Yes Marcine Matar, MD  atorvastatin (LIPITOR) 40 MG tablet Take 1 tablet (40 mg total) by mouth daily at 6 PM. 02/06/18  Yes Kilroy, Eda Paschal, PA-C  carvedilol (COREG) 25 MG tablet Take 1 tablet (25 mg total) by mouth 2 (two) times daily with a meal. 02/06/18  Yes Kilroy, Luke K, PA-C  glipiZIDE (GLUCOTROL) 10 MG tablet Take 1 tablet (10 mg total) by mouth 2 (two) times daily before a meal. 12/12/17  Yes Marcine Matar, MD  hydrALAZINE (APRESOLINE) 50 MG tablet Take 1 tablet (50 mg total) by mouth 3 (three)  times daily. 02/06/18 03/08/18 Yes Kilroy, Eda Paschal, PA-C  Insulin Glargine (LANTUS SOLOSTAR) 100 UNIT/ML Solostar Pen Inject 10 Units into the skin daily at 10 pm. Patient taking differently: Inject 10 Units into the skin at bedtime.  12/12/17  Yes Marcine Matar, MD  irbesartan (AVAPRO) 150 MG tablet Take 1 tablet (150 mg total) by mouth daily. 02/06/18  Yes Kilroy, Eda Paschal, PA-C  metFORMIN (GLUCOPHAGE) 1000 MG tablet Take 1 tablet (1,000 mg total) by mouth 2 (two) times daily with a meal. 12/12/17   Yes Marcine Matar, MD  sertraline (ZOLOFT) 100 MG tablet Take 1 tablet (100 mg total) by mouth daily. 12/12/17  Yes Marcine Matar, MD  spironolactone (ALDACTONE) 25 MG tablet Take 1 tablet (25 mg total) by mouth daily. 02/06/18  Yes Kilroy, Luke K, PA-C  gabapentin (NEURONTIN) 300 MG capsule Take 1 capsule (300 mg total) by mouth 2 (two) times daily for 14 days. 02/07/18 02/21/18  Mortis, Jerrel Ivory I, PA-C  Insulin Pen Needle 31G X 8 MM MISC Use as directed. 12/12/17   Marcine Matar, MD    Family History Family History  Problem Relation Age of Onset  . Hypertension Unknown   . Diabetes Mellitus II Unknown     Social History Social History   Tobacco Use  . Smoking status: Never Smoker  . Smokeless tobacco: Never Used  Substance Use Topics  . Alcohol use: Yes    Alcohol/week: 0.0 oz  . Drug use: No     Allergies   Imdur [isosorbide dinitrate] and Penicillins   Review of Systems Review of Systems  Constitutional: Negative for activity change, chills, diaphoresis, fatigue and fever.  HENT: Negative.  Negative for ear pain and tinnitus.   Eyes: Negative for pain and visual disturbance.  Respiratory: Positive for chest tightness. Negative for cough, shortness of breath and wheezing.   Cardiovascular: Positive for chest pain. Negative for palpitations and leg swelling.  Gastrointestinal: Negative for abdominal pain, constipation, diarrhea, nausea and vomiting.  Endocrine: Negative.   Genitourinary: Negative.   Musculoskeletal: Negative for arthralgias, back pain and gait problem.  Skin: Negative.   Neurological: Positive for light-headedness and numbness. Negative for dizziness, tremors, syncope, weakness and headaches.  Hematological: Negative.      Physical Exam Updated Vital Signs BP (!) 151/94 (BP Location: Left Arm)   Pulse 83   Temp 98.4 F (36.9 C) (Oral)   Resp (!) 24   Ht 6\' 2"  (1.88 m)   Wt (!) 180.1 kg (397 lb)   SpO2 97%   BMI 50.97 kg/m    Physical Exam  Constitutional: He is cooperative.  Non-toxic appearance. He does not have a sickly appearance. He does not appear ill. No distress.  Obese.  HENT:  Head: Normocephalic and atraumatic.  Eyes: Pupils are equal, round, and reactive to light. Conjunctivae and EOM are normal.  Neck: Normal range of motion. Neck supple.  Cardiovascular: Normal rate, regular rhythm, normal heart sounds, intact distal pulses and normal pulses.  No murmur heard. Pulmonary/Chest: Effort normal and breath sounds normal.  Abdominal: Soft. Bowel sounds are normal.  Musculoskeletal: Normal range of motion.       Right lower leg: He exhibits no edema.       Left lower leg: He exhibits no edema.  Neurological: He is alert. He has normal strength. No cranial nerve deficit or sensory deficit. He exhibits normal muscle tone. Coordination and gait normal.  Skin: Skin is warm and dry. Capillary  refill takes less than 2 seconds.  Psychiatric: He has a normal mood and affect. His behavior is normal.  Nursing note and vitals reviewed.    ED Treatments / Results  Labs (all labs ordered are listed, but only abnormal results are displayed) Labs Reviewed  CBC - Abnormal; Notable for the following components:      Result Value   MCV 72.2 (*)    MCH 24.0 (*)    All other components within normal limits  BASIC METABOLIC PANEL  D-DIMER, QUANTITATIVE (NOT AT [facility])  BRAIN NATRIURETIC PEPTIDE  I-STAT TROPONIN, ED    EKG EKG Interpretation  Date/Time:  Wednesday February 07 2018 12:35:05 EDT Ventricular Rate:  88 PR Interval:  166 QRS Duration: 94 QT Interval:  380 QTC Calculation: 459 R Axis:   75 Text Interpretation:  Normal sinus rhythm Normal ECG No significant change since last tracing Confirmed by Marily Memos [id number]) on 02/07/2018 1:58:52 PM   Radiology Dg Chest 2 View  Result Date: 02/07/2018 CLINICAL DATA:  Onset of mid sternal chest pain last night associated with dizziness. History of CHF,  hypertension, diabetes, and morbid obesity. Nonsmoker. EXAM: CHEST - 2 VIEW COMPARISON:  Chest x-ray of Dec 29, 2017 and chest CT scan of Dec 30, 2017. FINDINGS: The lungs are adequately inflated. The infrahilar lung markings on the right are coarse but this is not new. The heart is normal in size. The pulmonary vascularity is not engorged. The mediastinum is normal in width. The trachea is midline. There is no pleural effusion. The bony thorax exhibits no acute abnormality. IMPRESSION: There is no acute cardiopulmonary abnormality. Electronically Signed   By: David  [place] M.D.   On: 02/07/2018 13:31    Procedures Procedures (including critical care time)  Medications Ordered in ED Medications - No data to display   Initial Impression / Assessment and Plan / ED Course  Triage vital signs and the nursing notes have been reviewed.  Pertinent labs & imaging results that were available during care of the patient were reviewed and considered in medical decision making (see chart for details). Clinical Course as of Feb 07 1526  Wed Feb 07, 2018  1524 EKG showed NSR. No ST elevations/depressions or signs of acute ischemia or infarct. This is reassuring in combination with negative troponin which assists in evaluating and ruling out an acute cardiac process. Reassuring that all of his labs and CXR are normal.      [GM]    Clinical Course User Index [GM] Mortis, Sharyon Medicus, PA-C   Patient presents in no acute distress and is well appearing. He is currently asymptomatic. Labs, imaging and physical exam were unremarkable today which is reassuring. Acute cardiac and neuro processes such as ACS/MI, PE, CHF exacerbation and stroke were evaluated and appropriately ruled out. he intermittent occurrence of his multiple complaints makes it difficult to evaluate in the ED. Thorough education was provided on his various symptoms. Patient encouraged to follow-up with his PCP and cardiologist closely regarding  these complaints.   Final Clinical Impressions(s) / ED Diagnoses  1. Atypical Chest Pain. No evidence of acute cardiac event based on labs and EKG from today. Advised patient to follow-up with PCP if this continues. Education provided on red flags and s/s that warrant return to the ED. 2. 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 Neurontin 300mg  BID prescribed. Education provided on importance of blood glucose control. 3. Intermittent Lightheadedness. Past dx of vasovagal syncope, but could also be attributed to dehydration  or orthostasis. Education provided on all and advised patient on lifestyle modifications that can be made.  Dispo: Home. After thorough clinical evaluation, this patient is determined to be medically stable and can be safely discharged with the previously mentioned treatment and/or outpatient follow-up/referral(s). At this time, there are no other apparent medical conditions that require further screening, evaluation or treatment.  Final diagnoses:  Atypical chest pain  Neuropathic pain of left lower extremity  Neuropathic pain of right lower extremity  Intermittent lightheadedness    ED Discharge Orders        Ordered    gabapentin (NEURONTIN) 300 MG capsule  2 times daily     02/07/18 1501        Reva Bores 02/07/18 1530    Mesner, Barbara Cower, MD 02/08/18 [phone]

## 2018-02-07 NOTE — ED Triage Notes (Signed)
Pt endorses intermittent bilateral leg pain x 1 month since being admitted here. Pt was told that he had neuropathy. Last night pt began having bilateral calf pain and chest pain with dizziness.

## 2018-02-07 NOTE — Discharge Instructions (Signed)
You may take Neurontin 300mg  twice a day for nerve pain in your legs.  Follow-up with your PCP soon to discuss the issues we talked about today.  Remember to stay hydrated!

## 2018-02-14 ENCOUNTER — Encounter: Payer: Self-pay | Admitting: Cardiology

## 2018-02-14 ENCOUNTER — Ambulatory Visit (INDEPENDENT_AMBULATORY_CARE_PROVIDER_SITE_OTHER): Payer: Medicaid Other | Admitting: Cardiology

## 2018-02-14 VITALS — BP 125/84 | HR 89 | Ht 74.0 in | Wt 396.0 lb

## 2018-02-14 DIAGNOSIS — I5032 Chronic diastolic (congestive) heart failure: Secondary | ICD-10-CM

## 2018-02-14 DIAGNOSIS — I10 Essential (primary) hypertension: Secondary | ICD-10-CM | POA: Diagnosis not present

## 2018-02-14 DIAGNOSIS — E669 Obesity, unspecified: Secondary | ICD-10-CM

## 2018-02-14 DIAGNOSIS — E785 Hyperlipidemia, unspecified: Secondary | ICD-10-CM | POA: Diagnosis not present

## 2018-02-14 DIAGNOSIS — E1169 Type 2 diabetes mellitus with other specified complication: Secondary | ICD-10-CM | POA: Diagnosis not present

## 2018-02-14 MED ORDER — AMLODIPINE BESYLATE 10 MG PO TABS: 10.0000 mg | ORAL_TABLET | Freq: Every day | ORAL | 6 refills | Status: DC

## 2018-02-14 MED ORDER — CARVEDILOL 25 MG PO TABS
25.0000 mg | ORAL_TABLET | Freq: Two times a day (BID) | ORAL | 6 refills | Status: DC
Start: 2018-02-14 — End: 2019-03-26

## 2018-02-14 MED ORDER — ATORVASTATIN CALCIUM 40 MG PO TABS
40.0000 mg | ORAL_TABLET | Freq: Every day | ORAL | 6 refills | Status: DC
Start: 2018-02-14 — End: 2019-04-30

## 2018-02-14 MED ORDER — HYDRALAZINE HCL 50 MG PO TABS: 50.0000 mg | ORAL_TABLET | Freq: Three times a day (TID) | ORAL | 6 refills | Status: DC

## 2018-02-14 MED FILL — hydrALAZINE HCL 50 MG TABS: 50 | 10 days supply | Qty: 30 | Fill #0

## 2018-02-14 MED FILL — CARVEDILOL 25 MG TABLET: 25 | 30 days supply | Qty: 60 | Fill #0

## 2018-02-14 NOTE — Progress Notes (Signed)
02/14/2018 [facility]   1986/05/22  [id number]  Primary Physician Marcine Matar, MD Primary Cardiologist: Dr Allyson Sabal  HPI:  31 y/.o.obese AA malewith PMH of chronic diastolic CHF (EF 11-91% by echo in May 2019), HTN, and OSA (on CPAP). His BMI is 50 but he has gradually been dropping weight. He was admitted in May 2019 with chest pain felt to be non cardiac. He had a negative VQ in April 2019.  He was seen in the office 02/06/18 with complaints of dizziness after bending over. He denied syncope and his CHF appeared stable. He is on multiple medications and he felt they were interacting. I decreased his hydra;azine and changed his Avapro to QHS. I planned to see him back today to make further adjustments as needed. I thought if we could get him off TID Hydralazine it might improve compliance. Although he insists he is taking it 3 times a day.   On the afternoon after that visit he went to the ED with complaints of leg discomfort. He was placed on Neurontin which he says has helped. He is back in the office today for follow up B/P.  He says he has been doing well, no further dizziness. I suggested we continue his current medications since he appeared to be doing so well, but on my way out of the room he admitted he had run out of Avapro two weeks ago and Aldactone two days ago.    Current Outpatient Medications  Medication Sig Dispense Refill  . amLODipine (NORVASC) 10 MG tablet Take 1 tablet (10 mg total) by mouth daily. 90 tablet 3  . aspirin 81 MG EC tablet Take 1 tablet (81 mg total) by mouth daily. 100 tablet 1  . atorvastatin (LIPITOR) 40 MG tablet Take 1 tablet (40 mg total) by mouth daily at 6 PM. 90 tablet 3  . carvedilol (COREG) 25 MG tablet Take 1 tablet (25 mg total) by mouth 2 (two) times daily with a meal. 180 tablet 3  . gabapentin (NEURONTIN) 300 MG capsule Take 1 capsule (300 mg total) by mouth 2 (two) times daily for 14 days. 28 capsule 0  . glipiZIDE (GLUCOTROL) 10  MG tablet Take 1 tablet (10 mg total) by mouth 2 (two) times daily before a meal. 60 tablet 6  . hydrALAZINE (APRESOLINE) 50 MG tablet Take 1 tablet (50 mg total) by mouth 3 (three) times daily. 90 tablet 2  . Insulin Glargine (LANTUS SOLOSTAR) 100 UNIT/ML Solostar Pen Inject 10 Units into the skin daily at 10 pm. (Patient taking differently: Inject 10 Units into the skin at bedtime. ) 5 pen PRN  . Insulin Pen Needle 31G X 8 MM MISC Use as directed. 100 each 4  . irbesartan (AVAPRO) 150 MG tablet Take 1 tablet (150 mg total) by mouth daily. 90 tablet 3  . metFORMIN (GLUCOPHAGE) 1000 MG tablet Take 1 tablet (1,000 mg total) by mouth 2 (two) times daily with a meal. 60 tablet 5  . sertraline (ZOLOFT) 100 MG tablet Take 1 tablet (100 mg total) by mouth daily. 30 tablet 6  . spironolactone (ALDACTONE) 25 MG tablet Take 1 tablet (25 mg total) by mouth daily. 90 tablet 3   No current facility-administered medications for this visit.     Allergies  Allergen Reactions  . Imdur [Isosorbide Dinitrate] Other (See Comments)    Headaches   . Penicillins Other (See Comments)    From childhood; reaction not known: Has patient had a  PCN reaction causing immediate rash, facial/tongue/throat swelling, SOB or lightheadedness with hypotension: Unk Has patient had a PCN reaction causing severe rash involving mucus membranes or skin necrosis: Unk Has patient had a PCN reaction that required hospitalization: Unk Has patient had a PCN reaction occurring within the last 10 years: No If all of the above answers are "NO", then may proceed with Cephalosporin use.     Past Medical History:  Diagnosis Date  . Acute combined systolic and diastolic CHF, NYHA class 3 (HCC) 11/18/2016   EF now 40-45% by echo  . Acute diastolic (congestive) heart failure (HCC) 01/12/2016  . Diabetes mellitus, new onset (HCC)   . Hypertension   . Morbid obesity (HCC)   . OSA (obstructive sleep apnea)     Social History    Socioeconomic History  . Marital status: Single    Spouse name: Not on file  . Number of children: Not on file  . Years of education: Not on file  . Highest education level: Not on file  Occupational History  . Not on file  Social Needs  . Financial resource strain: Not on file  . Food insecurity:    Worry: Not on file    Inability: Not on file  . Transportation needs:    Medical: Not on file    Non-medical: Not on file  Tobacco Use  . Smoking status: Never Smoker  . Smokeless tobacco: Never Used  Substance and Sexual Activity  . Alcohol use: Yes    Alcohol/week: 0.0 oz  . Drug use: No  . Sexual activity: Yes  Lifestyle  . Physical activity:    Days per week: 0 days    Minutes per session: 0 min  . Stress: To some extent  Relationships  . Social connections:    Talks on phone: Patient refused    Gets together: Patient refused    Attends religious service: Patient refused    Active member of club or organization: Patient refused    Attends meetings of clubs or organizations: Patient refused    Relationship status: Patient refused  . Intimate partner violence:    Fear of current or ex partner: No    Emotionally abused: No    Physically abused: No    Forced sexual activity: No  Other Topics Concern  . Not on file  Social History Narrative  . Not on file     Family History  Problem Relation Age of Onset  . Hypertension Unknown   . Diabetes Mellitus II Unknown      Review of Systems: General: negative for chills, fever, night sweats or weight changes.  Cardiovascular: negative for chest pain, dyspnea on exertion, edema, orthopnea, palpitations, paroxysmal nocturnal dyspnea or shortness of breath Dermatological: negative for rash Respiratory: negative for cough or wheezing Urologic: negative for hematuria Abdominal: negative for nausea, vomiting, diarrhea, bright red blood per rectum, melena, or hematemesis Neurologic: negative for visual changes, syncope,  or dizziness All other systems reviewed and are otherwise negative except as noted above.    Blood pressure 125/84, pulse 89, height 6\' 2"  (1.88 m), weight (!) 396 lb (179.6 kg).  General appearance: alert, cooperative and morbidly obese Extremities: no LE edema Skin: Skin color, texture, turgor normal. No rashes or lesions Neurologic: Grossly normal   ASSESSMENT AND PLAN:    Chronic diastolic CHF (congestive heart failure) (HCC) Currently stable volume wise  Essential hypertension Compliance is questionable, I suspect this is financial. He appears stable on his current  medications so I made no changes.   Morbid obesity (HCC) BMI 50- his weight has been trending down.   Diabetes mellitus type 2 in obese [facility]) On oral agents   PLAN  Same Rx. F/U with your PCP. We'll see you back in 6 months.   Corine Shelter PA-C 02/14/2018 8:55 AM

## 2018-02-14 NOTE — Patient Instructions (Signed)
Medication Instructions:  DISCONTINUE AVAPRO AND SPIRONOLACTONE  Labwork: None   Testing/Procedures: None  Follow-Up: Your physician wants you to follow-up in: 6 months with Corine Shelter, PA-C. You will receive a reminder letter in the mail two months in advance. If you don't receive a letter, please call our office to schedule the follow-up appointment.  Any Other Special Instructions Will Be Listed Below (If Applicable).  If you need a refill on your cardiac medications before your next appointment, please call your pharmacy.

## 2018-02-15 ENCOUNTER — Encounter: Payer: Self-pay | Admitting: Cardiology

## 2018-02-15 ENCOUNTER — Encounter: Payer: Self-pay | Admitting: Internal Medicine

## 2018-02-16 ENCOUNTER — Telehealth: Payer: Self-pay | Admitting: Cardiology

## 2018-02-16 NOTE — Telephone Encounter (Signed)
New Message    Patient is returning call that he received today. He thinks in reference to his request about bariatric services. Please call to discuss.

## 2018-02-16 NOTE — Telephone Encounter (Signed)
-----   Message from Paul Lamb, [place] sent at 02/15/2018  4:17 PM EDT ----- T can you let Paul Lamb know that I reviewed his chart with Paul Lamb- we have no problem from a cardiac standpoint if he wants to have bariatric surgery, can you see if the patient has a referral for this. Usually the PCP refers the patient but if he doesn't have a PCP we can refer him (Paul Lamb).   Corine Shelter PA-C 02/15/2018 4:20 PM

## 2018-02-16 NOTE — Telephone Encounter (Signed)
Spoke with patient and gave [facility] recommendations. Informed patient that if his pcp will not do the referral that Dr Allyson Sabal will be more than glad to put in the referral. Informed patient to disregard the email sent through MyChart. Patient voiced understanding.

## 2018-02-19 MED FILL — AMLODIPINE BESYLATE 10 MG T: 10 | 30 days supply | Qty: 30 | Fill #0

## 2018-02-23 ENCOUNTER — Encounter: Payer: Self-pay | Admitting: Internal Medicine

## 2018-02-23 ENCOUNTER — Ambulatory Visit: Payer: Medicaid Other | Attending: Internal Medicine | Admitting: Internal Medicine

## 2018-02-23 VITALS — BP 132/90 | HR 81 | Temp 98.6°F | Resp 18 | Ht 74.0 in | Wt >= 6400 oz

## 2018-02-23 DIAGNOSIS — I110 Hypertensive heart disease with heart failure: Secondary | ICD-10-CM | POA: Diagnosis not present

## 2018-02-23 DIAGNOSIS — E1142 Type 2 diabetes mellitus with diabetic polyneuropathy: Secondary | ICD-10-CM | POA: Insufficient documentation

## 2018-02-23 DIAGNOSIS — Z7982 Long term (current) use of aspirin: Secondary | ICD-10-CM | POA: Insufficient documentation

## 2018-02-23 DIAGNOSIS — F329 Major depressive disorder, single episode, unspecified: Secondary | ICD-10-CM | POA: Diagnosis not present

## 2018-02-23 DIAGNOSIS — Z794 Long term (current) use of insulin: Secondary | ICD-10-CM | POA: Insufficient documentation

## 2018-02-23 DIAGNOSIS — E669 Obesity, unspecified: Secondary | ICD-10-CM | POA: Diagnosis not present

## 2018-02-23 DIAGNOSIS — Z880 Allergy status to penicillin: Secondary | ICD-10-CM | POA: Diagnosis not present

## 2018-02-23 DIAGNOSIS — E1169 Type 2 diabetes mellitus with other specified complication: Secondary | ICD-10-CM

## 2018-02-23 DIAGNOSIS — Z833 Family history of diabetes mellitus: Secondary | ICD-10-CM | POA: Insufficient documentation

## 2018-02-23 DIAGNOSIS — Z86711 Personal history of pulmonary embolism: Secondary | ICD-10-CM | POA: Diagnosis not present

## 2018-02-23 DIAGNOSIS — G4733 Obstructive sleep apnea (adult) (pediatric): Secondary | ICD-10-CM

## 2018-02-23 DIAGNOSIS — N529 Male erectile dysfunction, unspecified: Secondary | ICD-10-CM | POA: Diagnosis not present

## 2018-02-23 DIAGNOSIS — Z8249 Family history of ischemic heart disease and other diseases of the circulatory system: Secondary | ICD-10-CM | POA: Insufficient documentation

## 2018-02-23 DIAGNOSIS — Z888 Allergy status to other drugs, medicaments and biological substances status: Secondary | ICD-10-CM | POA: Diagnosis not present

## 2018-02-23 DIAGNOSIS — I5030 Unspecified diastolic (congestive) heart failure: Secondary | ICD-10-CM | POA: Diagnosis not present

## 2018-02-23 DIAGNOSIS — I5032 Chronic diastolic (congestive) heart failure: Secondary | ICD-10-CM | POA: Insufficient documentation

## 2018-02-23 DIAGNOSIS — I10 Essential (primary) hypertension: Secondary | ICD-10-CM | POA: Diagnosis not present

## 2018-02-23 DIAGNOSIS — Z79899 Other long term (current) drug therapy: Secondary | ICD-10-CM | POA: Insufficient documentation

## 2018-02-23 DIAGNOSIS — E119 Type 2 diabetes mellitus without complications: Secondary | ICD-10-CM | POA: Diagnosis present

## 2018-02-23 LAB — POCT GLYCOSYLATED HEMOGLOBIN (HGB A1C): HbA1c, POC (controlled diabetic range): 6.9 % (ref 0.0–7.0)

## 2018-02-23 LAB — GLUCOSE, POCT (MANUAL RESULT ENTRY): POC Glucose: 86 mg/dl (ref 70–99)

## 2018-02-23 MED ORDER — GABAPENTIN 300 MG PO CAPS: 300.0000 mg | ORAL_CAPSULE | Freq: Two times a day (BID) | ORAL | 6 refills | Status: DC

## 2018-02-23 MED ORDER — GABAPENTIN 300 MG PO CAPS
300.0000 mg | ORAL_CAPSULE | Freq: Two times a day (BID) | ORAL | 6 refills | Status: DC
Start: 2018-02-23 — End: 2018-11-09

## 2018-02-23 MED ORDER — HYDRALAZINE HCL 50 MG PO TABS: ORAL_TABLET | ORAL | 6 refills | Status: DC

## 2018-02-23 MED FILL — hydrALAZINE HCL 50 MG TABS: 50 | 30 days supply | Qty: 105 | Fill #0

## 2018-02-23 MED FILL — GABAPENTIN 300 MG CAPSULE: 300 | 30 days supply | Qty: 60 | Fill #0

## 2018-02-23 NOTE — Progress Notes (Signed)
Patient ID: Paul Lamb, male    DOB: 04-27-1986  MRN: [id number]  CC: Letter for School/Work   Subjective: Paul Lamb is a 32 y.o. male who presents for chronic ds management. His concerns today include:  Patient with history of HTN, diastolic CHF with EF of50-55%, HTN, OSA on CPAP, HL, morbid obesity, diabetes, depression.  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  In the meantime he has been working in an [employer].  He is wanting to get back to driving and feels that medically he is more stable to do so.  Since last visit with me he was hospitalized in May with chest pains and hypoxia.  CT angio of the chest was negative for PE.  It did show some hazy background groundglass opacity which was nonspecific and could have been due to viral infection per radiology's report. Seen in the ED 6/8 with dizziness and syncope.  He was orthostatic by pulse.  Symptoms thought to be due to dehydration and the blood pressure medicine Avapro.  Patient received IV fluid bolus and was discharged home.  He has not had any further episodes.  Seen in the ER again 6/19 for chest pains with exertion.  Deemed to be noncardiac.  Subsequently saw cardiology in follow-up.  Avapro d/c.  Pt continued to Hydralazine but lower dose.  BP was good.  DM:  Checks BS before breakfast and bedtime. A.m range 117-120s; bedtime around 135s with highest of 140.  Trying to eat smaller portions Tolerating Metformin, Glipizide and Lantus.   Gabapentin has helped with numbness and burning in legs He is requesting referral to [facility] for weight management and to be considered for bariatric surgery.  He has been cleared by cardiology to be considered for this.  OSA:  Compliant with CPAP  HTN: His home monitor broke No SOB or CP  Depression: He feels this is no longer an issue for him.  He stopped taking the Zoloft.     Patient Active Problem List   Diagnosis Date Noted  . Dizziness 02/06/2018  . Chest pain 12/29/2017  . Diabetes mellitus type 2 in obese (HCC) 12/29/2017  . Dietary noncompliance 11/28/2017  . Chronic diastolic CHF (congestive heart failure) (HCC) 11/28/2017  . ARF (acute renal failure) (HCC) 11/26/2017  . Diabetic ketoacidosis without coma associated with type 2 diabetes mellitus (HCC)   . Diabetes mellitus, new onset (HCC)   . Proteinuria   . Elevated troponin   . Chest pressure   . Erectile dysfunction 05/19/2017  . Depression 05/19/2017  . Essential hypertension 01/12/2016  . Sleep apnea 01/12/2016  . Cardiomegaly - hypertensive   . Abnormal EKG   . Hypertensive emergency 11/01/2015  . Hand pain, left 11/01/2015  . Morbid obesity (HCC) 11/01/2015     Current Outpatient Medications on File Prior to Visit  Medication Sig Dispense Refill  . amLODipine (NORVASC) 10 MG tablet Take 1 tablet (10 mg total) by mouth daily. 30 tablet 6  . aspirin 81 MG EC tablet Take 1 tablet (81 mg total) by mouth daily. 100 tablet 1  . atorvastatin (LIPITOR) 40 MG tablet Take 1 tablet (40 mg total) by mouth daily at 6 PM. 30 tablet 6  . carvedilol (COREG) 25 MG tablet Take 1 tablet (25 mg total) by mouth 2 (two) times daily with a meal. 60 tablet 6  . glipiZIDE (  GLUCOTROL) 10 MG tablet Take 1 tablet (10 mg total) by mouth 2 (two) times daily before a meal. 60 tablet 6  . Insulin Glargine (LANTUS SOLOSTAR) 100 UNIT/ML Solostar Pen Inject 10 Units into the skin daily at 10 pm. (Patient taking differently: Inject 10 Units into the skin at bedtime. ) 5 pen PRN  . Insulin Pen Needle 31G X 8 MM MISC Use as directed. 100 each 4  . metFORMIN (GLUCOPHAGE) 1000 MG tablet Take 1 tablet (1,000 mg total) by mouth 2 (two) times daily with a meal. 60 tablet 5   No current facility-administered medications on file prior to visit.     Allergies  Allergen Reactions  . Imdur [Isosorbide Dinitrate] Other (See  Comments)    Headaches   . Penicillins Other (See Comments)    From childhood; reaction not known: Has patient had a PCN reaction causing immediate rash, facial/tongue/throat swelling, SOB or lightheadedness with hypotension: Unk Has patient had a PCN reaction causing severe rash involving mucus membranes or skin necrosis: Unk Has patient had a PCN reaction that required hospitalization: Unk Has patient had a PCN reaction occurring within the last 10 years: No If all of the above answers are "NO", then may proceed with Cephalosporin use.     Social History   Socioeconomic History  . Marital status: Single    Spouse name: Not on file  . Number of children: Not on file  . Years of education: Not on file  . Highest education level: Not on file  Occupational History  . Not on file  Social Needs  . Financial resource strain: Not on file  . Food insecurity:    Worry: Not on file    Inability: Not on file  . Transportation needs:    Medical: Not on file    Non-medical: Not on file  Tobacco Use  . Smoking status: Never Smoker  . Smokeless tobacco: Never Used  Substance and Sexual Activity  . Alcohol use: Yes    Alcohol/week: 0.0 oz  . Drug use: No  . Sexual activity: Yes  Lifestyle  . Physical activity:    Days per week: 0 days    Minutes per session: 0 min  . Stress: To some extent  Relationships  . Social connections:    Talks on phone: Patient refused    Gets together: Patient refused    Attends religious service: Patient refused    Active member of club or organization: Patient refused    Attends meetings of clubs or organizations: Patient refused    Relationship status: Patient refused  . Intimate partner violence:    Fear of current or ex partner: No    Emotionally abused: No    Physically abused: No    Forced sexual activity: No  Other Topics Concern  . Not on file  Social History Narrative  . Not on file    Family History  Problem Relation Age of Onset    . Hypertension Unknown   . Diabetes Mellitus II Unknown     Past Surgical History:  Procedure Laterality Date  . NO PAST SURGERIES      ROS: Review of Systems Negative except as stated above  PHYSICAL EXAM: BP (!) 154/93 (BP Location: Left Arm, Patient Position: Sitting, Cuff Size: Large)   Pulse 81   Temp 98.6 F (37 C) (Oral)   Resp 18   Ht 6\' 2"  (1.88 m)   Wt (!) 400 lb (181.4 kg)   SpO2  96%   BMI 51.36 kg/m   Wt Readings from Last 3 Encounters:  02/23/18 (!) 400 lb (181.4 kg)  02/14/18 (!) 396 lb (179.6 kg)  02/07/18 (!) 397 lb (180.1 kg)  Repeat blood pressure 132/90  Physical Exam  General appearance - alert, well appearing, obese African-American male and in no distress Mental status - normal mood, behavior, speech, dress, motor activity, and thought processes Neck - supple, no significant adenopathy Chest - clear to auscultation, no wheezes, rales or rhonchi, symmetric air entry Heart - normal rate, regular rhythm, normal S1, S2, no murmurs, rubs, clicks or gallops Extremities - peripheral pulses normal, no pedal edema, no clubbing or cyanosis  Results for orders placed or performed in visit on 02/23/18  Glucose (CBG)  Result Value Ref Range   POC Glucose 86 70 - 99 mg/dl  HgB G9F  Result Value Ref Range   Hemoglobin A1C  4.0 - 5.6 %   HbA1c POC (<> result, manual entry)  4.0 - 5.6 %   HbA1c, POC (prediabetic range)  5.7 - 6.4 %   HbA1c, POC (controlled diabetic range) 6.9 0.0 - 7.0 %   Lab Results  Component Value Date   HGBA1C 6.9 02/23/2018     ASSESSMENT AND PLAN: 1. Diabetes mellitus type 2 in obese [facility]) Commended him on getting his diabetes under good control.  We will have him continue the Metformin, glipizide and Lantus.  Continue to stressed importance of healthy eating habits. - Glucose (CBG) - HgB A1c  2. Morbid obesity (HCC) We will refer to [facility]. Continue to encourage healthy eating habits, smaller portion  sizes - Ambulatory referral to General Surgery  3. OSA (obstructive sleep apnea) Compliant with CPAP.  Denies any daytime sleepiness.  4. Essential hypertension Close to goal.  Currently on hydralazine 50 mg 3 times daily.  I recommend changing that to 50 mg in the morning and afternoon and 75 mg in the evening.  Continue carvedilol and amlodipine - hydrALAZINE (APRESOLINE) 50 MG tablet; 50 mg in a.m and afternoon and 75 mg in evening  Dispense: 105 tablet; Refill: 6  5. Diastolic congestive heart failure, unspecified HF chronicity (HCC) Compensated.  Continue current medications and salt restriction.  6. Diabetic polyneuropathy associated with type 2 diabetes mellitus (HCC) - gabapentin (NEURONTIN) 300 MG capsule; Take 1 capsule (300 mg total) by mouth 2 (two) times daily.  Dispense: 60 capsule; Refill: 6  Letter of release given for him to return to work.  I think he is medically stable to do so.  Patient was given the opportunity to ask questions.  Patient verbalized understanding of the plan and was able to repeat key elements of the plan.   Orders Placed This Encounter  Procedures  . Ambulatory referral to General Surgery  . Glucose (CBG)  . HgB A1c     Requested Prescriptions   Signed Prescriptions Disp Refills  . hydrALAZINE (APRESOLINE) 50 MG tablet 105 tablet 6    Sig: 50 mg in a.m and afternoon and 75 mg in evening  . gabapentin (NEURONTIN) 300 MG capsule 60 capsule 6    Sig: Take 1 capsule (300 mg total) by mouth 2 (two) times daily.    Return in about 3 months (around 05/26/2018).  [name], MD, FACP

## 2018-02-23 NOTE — Patient Instructions (Signed)
Change Hydralazine to 50 mg in morning and afternoon and 75 mg in the evening.

## 2018-03-01 ENCOUNTER — Telehealth: Payer: Self-pay

## 2018-03-01 NOTE — Telephone Encounter (Signed)
Did a follow up call to make sure patient received his bariatric surgery referral. Patient stated his pcp completed the referral.

## 2018-03-02 MED FILL — LOSARTAN-HCTZ 100-25 MG TAB: 100-25 | 30 days supply | Qty: 30 | Fill #0

## 2018-03-07 ENCOUNTER — Encounter: Payer: Self-pay | Admitting: Internal Medicine

## 2018-03-07 MED ORDER — FUROSEMIDE 20 MG PO TABS: 20.0000 mg | ORAL_TABLET | Freq: Every day | ORAL | 3 refills | Status: DC

## 2018-03-07 MED FILL — FUROSEMIDE 20 MG TABLET: 20 | 30 days supply | Qty: 30 | Fill #0

## 2018-03-09 ENCOUNTER — Telehealth: Payer: Self-pay | Admitting: Internal Medicine

## 2018-03-09 NOTE — Telephone Encounter (Signed)
Called patient to schedule a same day appointment and patient stated that he was not in town and would call back to schedule an appointment.

## 2018-04-11 MED FILL — CARVEDILOL 25 MG TABLET: 25 | 30 days supply | Qty: 60 | Fill #1

## 2018-04-11 MED FILL — $VIAGRA 100 MG TABLET: 100 | 30 days supply | Qty: 10 | Fill #2

## 2018-04-11 MED FILL — glipiZIDE 10 MG TABS: 10 | 30 days supply | Qty: 60 | Fill #1

## 2018-04-11 MED FILL — AMLODIPINE BESYLATE 10 MG T: 10 | 30 days supply | Qty: 30 | Fill #1

## 2018-04-13 ENCOUNTER — Ambulatory Visit: Payer: Self-pay | Admitting: Internal Medicine

## 2018-04-13 MED FILL — LANTUS SOLOSTAR 100 UNITS/M: 100 | 28 days supply | Qty: 3 | Fill #2

## 2018-04-13 MED FILL — GABAPENTIN 300 MG CAPSULE: 300 | 30 days supply | Qty: 60 | Fill #1

## 2018-04-13 MED FILL — FUROSEMIDE 20 MG TABLET: 20 | 30 days supply | Qty: 30 | Fill #1

## 2018-04-13 MED FILL — ATORVASTATIN CALCIUM 40 MG: 40 | 30 days supply | Qty: 30 | Fill #0

## 2018-04-13 MED FILL — LOSARTAN-HCTZ 100-25 MG TAB: 100-25 | 30 days supply | Qty: 30 | Fill #1

## 2018-04-13 MED FILL — hydrALAZINE HCL 50 MG TABS: 50 | 30 days supply | Qty: 105 | Fill #1

## 2018-04-17 ENCOUNTER — Other Ambulatory Visit: Payer: Self-pay | Admitting: *Deleted

## 2018-04-17 DIAGNOSIS — I10 Essential (primary) hypertension: Secondary | ICD-10-CM

## 2018-04-27 ENCOUNTER — Encounter: Payer: Self-pay | Admitting: Internal Medicine

## 2018-06-01 ENCOUNTER — Ambulatory Visit: Payer: Medicaid Other | Admitting: Internal Medicine

## 2018-06-06 ENCOUNTER — Telehealth: Payer: Self-pay | Admitting: Internal Medicine

## 2018-06-06 NOTE — Telephone Encounter (Signed)
FYI Patient was last seen 02/23/18. Pt had an appointment on 8/23 he canceled and pt had an appointment on 10/11 he no showed. Contacted pt to make him that he may need to see Dr. Laural Benes for letter and for medical clearance. 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 Pt has scheduled an appointment for 10/24 @850am .

## 2018-06-06 NOTE — Telephone Encounter (Signed)
Patient called wanting to get written a letter by Dr.johnson that says she is currently treating him for blood pressure and that he is cleared to work. Please follow up with patient.

## 2018-06-08 ENCOUNTER — Encounter: Payer: Self-pay | Admitting: Internal Medicine

## 2018-06-08 ENCOUNTER — Ambulatory Visit: Payer: Medicaid Other | Attending: Internal Medicine | Admitting: Internal Medicine

## 2018-06-08 VITALS — BP 137/96 | HR 96 | Temp 99.0°F | Resp 16 | Wt >= 6400 oz

## 2018-06-08 DIAGNOSIS — I5032 Chronic diastolic (congestive) heart failure: Secondary | ICD-10-CM | POA: Insufficient documentation

## 2018-06-08 DIAGNOSIS — Z23 Encounter for immunization: Secondary | ICD-10-CM | POA: Insufficient documentation

## 2018-06-08 DIAGNOSIS — IMO0002 Reserved for concepts with insufficient information to code with codable children: Secondary | ICD-10-CM

## 2018-06-08 DIAGNOSIS — E1142 Type 2 diabetes mellitus with diabetic polyneuropathy: Secondary | ICD-10-CM | POA: Insufficient documentation

## 2018-06-08 DIAGNOSIS — Z880 Allergy status to penicillin: Secondary | ICD-10-CM | POA: Insufficient documentation

## 2018-06-08 DIAGNOSIS — Z794 Long term (current) use of insulin: Secondary | ICD-10-CM | POA: Insufficient documentation

## 2018-06-08 DIAGNOSIS — Z6841 Body Mass Index (BMI) 40.0 and over, adult: Secondary | ICD-10-CM | POA: Diagnosis not present

## 2018-06-08 DIAGNOSIS — G4733 Obstructive sleep apnea (adult) (pediatric): Secondary | ICD-10-CM | POA: Insufficient documentation

## 2018-06-08 DIAGNOSIS — I10 Essential (primary) hypertension: Secondary | ICD-10-CM

## 2018-06-08 DIAGNOSIS — E1165 Type 2 diabetes mellitus with hyperglycemia: Secondary | ICD-10-CM

## 2018-06-08 DIAGNOSIS — Z7982 Long term (current) use of aspirin: Secondary | ICD-10-CM | POA: Insufficient documentation

## 2018-06-08 DIAGNOSIS — I110 Hypertensive heart disease with heart failure: Secondary | ICD-10-CM | POA: Insufficient documentation

## 2018-06-08 DIAGNOSIS — F329 Major depressive disorder, single episode, unspecified: Secondary | ICD-10-CM | POA: Diagnosis not present

## 2018-06-08 LAB — POCT GLYCOSYLATED HEMOGLOBIN (HGB A1C): HBA1C, POC (CONTROLLED DIABETIC RANGE): 8.6 % — AB (ref 0.0–7.0)

## 2018-06-08 LAB — GLUCOSE, POCT (MANUAL RESULT ENTRY): POC GLUCOSE: 233 mg/dL — AB (ref 70–99)

## 2018-06-08 MED ORDER — INSULIN GLARGINE 100 UNIT/ML SOLOSTAR PEN: 14.0000 [IU] | PEN_INJECTOR | Freq: Every day | SUBCUTANEOUS | 99 refills | Status: DC

## 2018-06-08 MED ORDER — METFORMIN HCL 1000 MG PO TABS: 1000.0000 mg | ORAL_TABLET | Freq: Two times a day (BID) | ORAL | 5 refills | Status: DC

## 2018-06-08 MED ORDER — HYDRALAZINE HCL 50 MG PO TABS: 75.0000 mg | ORAL_TABLET | Freq: Three times a day (TID) | ORAL | 6 refills | Status: DC

## 2018-06-08 MED ORDER — LOSARTAN POTASSIUM-HCTZ 100-25 MG PO TABS
1.0000 | ORAL_TABLET | Freq: Every day | ORAL | 3 refills | Status: DC
Start: 2018-06-08 — End: 2018-09-25

## 2018-06-08 MED FILL — hydrALAZINE HCL 50 MG TABS: 50 | 30 days supply | Qty: 135 | Fill #0

## 2018-06-08 MED FILL — metFORMIN HCL 1000 MG TABS: 1000 | 30 days supply | Qty: 60 | Fill #0

## 2018-06-08 MED FILL — LANTUS SOLOSTAR 100 UNITS/M: 100 | 30 days supply | Qty: 15 | Fill #0

## 2018-06-08 MED FILL — LOSARTAN-HCTZ 100-25 MG TAB: 100-25 | 30 days supply | Qty: 30 | Fill #0

## 2018-06-08 NOTE — Progress Notes (Signed)
Patient ID: Paul Lamb, male    DOB: September 26, 1985  MRN: [id number]  CC: Diabetes and Hypertension   Subjective: Paul Lamb is a 32 y.o. male who presents for chronic disease management. His concerns today include:  Patient with history ofHTN,diastolic CHF with EF of50-55%, HTN, OSA on CPAP, HL, morbid obesity, diabetes with neuropathy, depression.  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  BP elevate at 160/118 on recent DOT physical.  Found to be elevated 3 days in a row.  Subsequently bought BP monitoring device 4 days ago.  Home BP was 135/90 -His blood pressure medications include carvedilol, hydralazine, amlodipine and furosemide.  Reports that he is also been on losartan/HCTZ 100/25mg  x 3 mths that was prescribed by a physician 3 months ago who check BP for DOT eval. -reports compliance with meds and salt restriction.  I checked with our pharmacy however and his last med refills were on August 23.  Patient states that he did not have to refill them because he still had some leftover from previous refills No CP.  + LE when he is on his feet during the day but not as bad as before.  No PND or orthopnea.  Using CPAP machine every night.  No day time sleepiness Walking 2 miles most days for the past few weeks  -Here today for blood pressure recheck and hopes that he can get a letter releasing him to return to work  Obesity: did see bariatric surgeon but he did not have full family support to move forward with weight reduction surgery.    Eating habits: wife does meal preps for him  DM:  Checks BS 3 times a day. Gives range 130-170.   reports compliance with meds.  Patient Active Problem List   Diagnosis Date Noted  . Diabetic polyneuropathy associated with type 2 diabetes mellitus (HCC) 02/23/2018  . Diabetes mellitus type 2 in obese (HCC) 12/29/2017  . Diastolic congestive heart failure (HCC) 11/28/2017  . Proteinuria   . Chest pressure     . Erectile dysfunction 05/19/2017  . Depression 05/19/2017  . Essential hypertension 01/12/2016  . Sleep apnea 01/12/2016  . Abnormal EKG   . Hand pain, left 11/01/2015  . Morbid obesity (HCC) 11/01/2015     Current Outpatient Medications on File Prior to Visit  Medication Sig Dispense Refill  . amLODipine (NORVASC) 10 MG tablet Take 1 tablet (10 mg total) by mouth daily. 30 tablet 6  . aspirin 81 MG EC tablet Take 1 tablet (81 mg total) by mouth daily. 100 tablet 1  . atorvastatin (LIPITOR) 40 MG tablet Take 1 tablet (40 mg total) by mouth daily at 6 PM. 30 tablet 6  . carvedilol (COREG) 25 MG tablet Take 1 tablet (25 mg total) by mouth 2 (two) times daily with a meal. 60 tablet 6  . furosemide (LASIX) 20 MG tablet Take 1 tablet (20 mg total) by mouth daily. 30 tablet 3  . gabapentin (NEURONTIN) 300 MG capsule Take 1 capsule (300 mg total) by mouth 2 (two) times daily. 60 capsule 6  . glipiZIDE (GLUCOTROL) 10 MG tablet Take 1 tablet (10 mg total) by mouth 2 (two) times daily before a meal. 60 tablet 6  . hydrALAZINE (APRESOLINE) 50 MG tablet 50 mg in a.m and afternoon and 75 mg in evening 105 tablet 6  . Insulin Glargine (LANTUS SOLOSTAR) 100 UNIT/ML Solostar Pen Inject 10 Units into the skin daily at  10 pm. (Patient taking differently: Inject 10 Units into the skin at bedtime. ) 5 pen PRN  . Insulin Pen Needle 31G X 8 MM MISC Use as directed. 100 each 4  . metFORMIN (GLUCOPHAGE) 1000 MG tablet Take 1 tablet (1,000 mg total) by mouth 2 (two) times daily with a meal. 60 tablet 5   No current facility-administered medications on file prior to visit.     Allergies  Allergen Reactions  . Imdur [Isosorbide Dinitrate] Other (See Comments)    Headaches   . Penicillins Other (See Comments)    From childhood; reaction not known: Has patient had a PCN reaction causing immediate rash, facial/tongue/throat swelling, SOB or lightheadedness with hypotension: Unk Has patient had a PCN reaction  causing severe rash involving mucus membranes or skin necrosis: Unk Has patient had a PCN reaction that required hospitalization: Unk Has patient had a PCN reaction occurring within the last 10 years: No If all of the above answers are "NO", then may proceed with Cephalosporin use.     Social History   Socioeconomic History  . Marital status: Single    Spouse name: Not on file  . Number of children: Not on file  . Years of education: Not on file  . Highest education level: Not on file  Occupational History  . Not on file  Social Needs  . Financial resource strain: Not on file  . Food insecurity:    Worry: Not on file    Inability: Not on file  . Transportation needs:    Medical: Not on file    Non-medical: Not on file  Tobacco Use  . Smoking status: Never Smoker  . Smokeless tobacco: Never Used  Substance and Sexual Activity  . Alcohol use: Yes    Alcohol/week: 0.0 standard drinks  . Drug use: No  . Sexual activity: Yes  Lifestyle  . Physical activity:    Days per week: 0 days    Minutes per session: 0 min  . Stress: To some extent  Relationships  . Social connections:    Talks on phone: Patient refused    Gets together: Patient refused    Attends religious service: Patient refused    Active member of club or organization: Patient refused    Attends meetings of clubs or organizations: Patient refused    Relationship status: Patient refused  . Intimate partner violence:    Fear of current or ex partner: No    Emotionally abused: No    Physically abused: No    Forced sexual activity: No  Other Topics Concern  . Not on file  Social History Narrative  . Not on file    Family History  Problem Relation Age of Onset  . Hypertension Unknown   . Diabetes Mellitus II Unknown     Past Surgical History:  Procedure Laterality Date  . NO PAST SURGERIES      ROS: Review of Systems Negative except as above. PHYSICAL EXAM: BP (!) 137/96   Pulse 96   Temp 99 F  (37.2 C) (Oral)   Resp 16   Wt (!) 424 lb 3.2 oz (192.4 kg)   SpO2 95%   BMI 54.46 kg/m   Repeat blood pressure using a large cuff is BP 145/95 Physical Exam  General appearance - alert, well appearing, and in no distress Mental status - normal mood, behavior, speech, dress, motor activity, and thought processes Chest - clear to auscultation, no wheezes, rales or rhonchi, symmetric air entry  Heart - normal rate, regular rhythm, normal S1, S2, no murmurs, rubs, clicks or gallops Extremities -trace lower extremity edema.  Results for orders placed or performed in visit on 06/08/18  POCT glucose (manual entry)  Result Value Ref Range   POC Glucose 233 (A) 70 - 99 mg/dl  POCT glycosylated hemoglobin (Hb A1C)  Result Value Ref Range   Hemoglobin A1C     HbA1c POC (<> result, manual entry)     HbA1c, POC (prediabetic range)     HbA1c, POC (controlled diabetic range) 8.6 (A) 0.0 - 7.0 %    ASSESSMENT AND PLAN: 1. Uncontrolled type 2 diabetes mellitus with peripheral neuropathy (HCC) Increase Lantus to 14 units daily.  Continue metformin.  Continue healthy eating habits.  Commended him on walking daily.  Encouraged him to continue doing that. - POCT glucose (manual entry) - POCT glycosylated hemoglobin (Hb A1C) - Microalbumin / creatinine urine ratio - Insulin Glargine (LANTUS SOLOSTAR) 100 UNIT/ML Solostar Pen; Inject 14 Units into the skin daily at 10 pm.  Dispense: 5 pen; Refill: PRN  2. Essential hypertension Not at goal.  He has reassured me that he has been taking his medications as prescribed.  I was not aware that he was on the Cozaar/HCTZ.  I have added this to his med list.  Increase hydralazine to 75 mg 3 times daily Follow-up in a few weeks with clinical pharmacist for recheck of blood pressure. Letter given for work - hydrALAZINE (APRESOLINE) 50 MG tablet; Take 1.5 tablets (75 mg total) by mouth 3 (three) times daily. 75 mg PO TID  Dispense: 135 tablet; Refill: 6 - Basic  Metabolic Panel  3. Need for immunization against influenza - Flu Vaccine QUAD 36+ mos IM  4. Morbid obesity (HCC) See #1 above    Patient was given the opportunity to ask questions.  Patient verbalized understanding of the plan and was able to repeat key elements of the plan.   Orders Placed This Encounter  Procedures  . Microalbumin / creatinine urine ratio  . POCT glucose (manual entry)  . POCT glycosylated hemoglobin (Hb A1C)     Requested Prescriptions    No prescriptions requested or ordered in this encounter    No follow-ups on file.  [name], MD, FACP

## 2018-06-08 NOTE — Patient Instructions (Addendum)
Please give this patient an appointment with the clinical pharmacist in 2 weeks for repeat blood pressure check.  Your blood pressure is not at goal.  I recommend increasing hydralazine to 75 mg 3 times a day.  We would like to see her back in 2 week for repeat blood pressure check.  Your diabetes is not at goal.  Increase Lantus to 14 units daily.  Td Vaccine (Tetanus and Diphtheria): What You Need to Know 1. Why get vaccinated? Tetanus  and diphtheria are very serious diseases. They are rare in the [place] today, but people who do become infected often have severe complications. Td vaccine is used to protect adolescents and adults from both of these diseases. Both tetanus and diphtheria are infections caused by bacteria. Diphtheria spreads from person to person through coughing or sneezing. Tetanus-causing bacteria enter the body through cuts, scratches, or wounds. TETANUS (lockjaw) causes painful muscle tightening and stiffness, usually all over the body.  It can lead to tightening of muscles in the head and neck so you can't open your mouth, swallow, or sometimes even breathe. Tetanus kills about 1 out of every 10 people who are infected even after receiving the best medical care.  DIPHTHERIA can cause a thick coating to form in the back of the throat.  It can lead to breathing problems, paralysis, heart failure, and death.  Before vaccines, as many as 200,000 cases of diphtheria and hundreds of cases of tetanus were reported in the [place] each year. Since vaccination began, reports of cases for both diseases have dropped by about 99%. 2. Td vaccine Td vaccine can protect adolescents and adults from tetanus and diphtheria. Td is usually given as a booster dose every 10 years but it can also be given earlier after a severe and dirty wound or burn. Another vaccine, called Tdap, which protects against pertussis in addition to tetanus and diphtheria, is sometimes recommended instead  of Td vaccine. Your doctor or the person giving you the vaccine can give you more information. Td may safely be given at the same time as other vaccines. 3. Some people should not get this vaccine  A person who has ever had a life-threatening allergic reaction after a previous dose of any tetanus or diphtheria containing vaccine, OR has a severe allergy to any part of this vaccine, should not get Td vaccine. Tell the person giving the vaccine about any severe allergies.  Talk to your doctor if you: ? had severe pain or swelling after any vaccine containing diphtheria or tetanus, ? ever had a condition called Guillain Barre Syndrome (GBS), ? aren't feeling well on the day the shot is scheduled. 4. What are the risks from Td vaccine? With any medicine, including vaccines, there is a chance of side effects. These are usually mild and go away on their own. Serious reactions are also possible but are rare. Most people who get Td vaccine do not have any problems with it. Mild problems following Td vaccine: (Did not interfere with activities)  Pain where the shot was given (about 8 people in 10)  Redness or swelling where the shot was given (about 1 person in 4)  Mild fever (rare)  Headache (about 1 person in 4)  Tiredness (about 1 person in 4)  Moderate problems following Td vaccine: (Interfered with activities, but did not require medical attention)  Fever over 102F (rare)  Severe problems following Td vaccine: (Unable to perform usual activities; required medical attention)  Swelling, severe  pain, bleeding and/or redness in the arm where the shot was given (rare).  Problems that could happen after any vaccine:  People sometimes faint after a medical procedure, including vaccination. Sitting or lying down for about 15 minutes can help prevent fainting, and injuries caused by a fall. Tell your doctor if you feel dizzy, or have vision changes or ringing in the ears.  Some people  get severe pain in the shoulder and have difficulty moving the arm where a shot was given. This happens very rarely.  Any medication can cause a severe allergic reaction. Such reactions from a vaccine are very rare, estimated at fewer than 1 in a million doses, and would happen within a few minutes to a few hours after the vaccination. As with any medicine, there is a very remote chance of a vaccine causing a serious injury or death. The safety of vaccines is always being monitored. For more information, visit: http://floyd.org/ 5. What if there is a serious reaction? What should I look for? Look for anything that concerns you, such as signs of a severe allergic reaction, very high fever, or unusual behavior. Signs of a severe allergic reaction can include hives, swelling of the face and throat, difficulty breathing, a fast heartbeat, dizziness, and weakness. These would usually start a few minutes to a few hours after the vaccination. What should I do?  If you think it is a severe allergic reaction or other emergency that can't wait, call 9-1-1 or get the person to the nearest hospital. Otherwise, call your doctor.  Afterward, the reaction should be reported to the Vaccine Adverse Event Reporting System (VAERS). Your doctor might file this report, or you can do it yourself through the VAERS web site at www.vaers.LAgents.no, or by calling 1-276-444-0461. ? VAERS does not give medical advice. 6. The National Vaccine Injury Compensation Program The [employer] Vaccine Injury Compensation Program (VICP) is a federal program that was created to compensate people who may have been injured by certain vaccines. Persons who believe they may have been injured by a vaccine can learn about the program and about filing a claim by calling 1-314 343 4491 or visiting the VICP website at SpiritualWord.at. There is a time limit to file a claim for compensation. 7. How can I learn more?  Ask  your doctor. He or she can give you the vaccine package insert or suggest other sources of information.  Call your local or state health department.  Contact the Centers for Disease Control and Prevention (CDC): ? Call 202-053-0115 (1-800-CDC-INFO) ? Visit CDC's website at PicCapture.uy CDC Td Vaccine VIS (12/01/15) This information is not intended to replace advice given to you by your health care provider. Make sure you discuss any questions you have with your health care provider. Document Released: 06/05/2006 Document Revised: 04/28/2016 Document Reviewed: 04/28/2016 Elsevier Interactive Patient Education  2017 Else[employer] Inc. Pneumococcal Polysaccharide Vaccine: What You Need to Know 1. Why get vaccinated? Vaccination can protect older adults (and some children and younger adults) from pneumococcal disease. Pneumococcal disease is caused by bacteria that can spread from person to person through close contact. It can cause ear infections, and it can also lead to more serious infections of the:  Lungs (pneumonia),  Blood (bacteremia), and  Covering of the brain and spinal cord (meningitis). Meningitis can cause deafness and brain damage, and it can be fatal.  Anyone can get pneumococcal disease, but children under 74 years of age, people with certain medical conditions, adults over 65 years of  age, and cigarette smokers are at the highest risk. About 18,000 older adults die each year from pneumococcal disease in the [place]. Treatment of pneumococcal infections with penicillin and other drugs used to be more effective. But some strains of the disease have become resistant to these drugs. This makes prevention of the disease, through vaccination, even more important. 2. Pneumococcal polysaccharide vaccine (PPSV23) Pneumococcal polysaccharide vaccine (PPSV23) protects against 23 types of pneumococcal bacteria. It will not prevent all pneumococcal disease. PPSV23 is recommended  for:  All adults 77 years of age and older,  Anyone 2 through 31 years of age with certain long-term health problems,  Anyone 2 through 31 years of age with a weakened immune system,  Adults 67 through 31 years of age who smoke cigarettes or have asthma.  Most people need only one dose of PPSV. A second dose is recommended for certain high-risk groups. People 5 and older should get a dose even if they have gotten one or more doses of the vaccine before they turned 65. Your healthcare provider can give you more information about these recommendations. Most healthy adults develop protection within 2 to 3 weeks of getting the shot. 3. Some people should not get this vaccine  Anyone who has had a life-threatening allergic reaction to PPSV should not get another dose.  Anyone who has a severe allergy to any component of PPSV should not receive it. Tell your provider if you have any severe allergies.  Anyone who is moderately or severely ill when the shot is scheduled may be asked to wait until they recover before getting the vaccine. Someone with a mild illness can usually be vaccinated.  Children less than 21 years of age should not receive this vaccine.  There is no evidence that PPSV is harmful to either a pregnant woman or to her fetus. However, as a precaution, women who need the vaccine should be vaccinated before becoming pregnant, if possible. 4. Risks of a vaccine reaction With any medicine, including vaccines, there is a chance of side effects. These are usually mild and go away on their own, but serious reactions are also possible. About half of people who get PPSV have mild side effects, such as redness or pain where the shot is given, which go away within about two days. Less than 1 out of 100 people develop a fever, muscle aches, or more severe local reactions. Problems that could happen after any vaccine:  People sometimes faint after a medical procedure, including  vaccination. Sitting or lying down for about 15 minutes can help prevent fainting, and injuries caused by a fall. Tell your doctor if you feel dizzy, or have vision changes or ringing in the ears.  Some people get severe pain in the shoulder and have difficulty moving the arm where a shot was given. This happens very rarely.  Any medication can cause a severe allergic reaction. Such reactions from a vaccine are very rare, estimated at about 1 in a million doses, and would happen within a few minutes to a few hours after the vaccination. As with any medicine, there is a very remote chance of a vaccine causing a serious injury or death. The safety of vaccines is always being monitored. For more information, visit: http://floyd.org/ 5. What if there is a serious reaction? What should I look for? Look for anything that concerns you, such as signs of a severe allergic reaction, very high fever, or unusual behavior. Signs of a severe allergic reaction  can include hives, swelling of the face and throat, difficulty breathing, a fast heartbeat, dizziness, and weakness. These would usually start a few minutes to a few hours after the vaccination. What should I do? If you think it is a severe allergic reaction or other emergency that can't wait, call 9-1-1 or get to the nearest hospital. Otherwise, call your doctor. Afterward, the reaction should be reported to the Vaccine Adverse Event Reporting System (VAERS). Your doctor might file this report, or you can do it yourself through the VAERS web site at www.vaers.LAgents.no, or by calling 1-667-105-1852. VAERS does not give medical advice. 6. How can I learn more?  Ask your doctor. He or she can give you the vaccine package insert or suggest other sources of information.  Call your local or state health department.  Contact the Centers for Disease Control and Prevention (CDC): ? Call 306-272-7452 (1-800-CDC-INFO) or ? Visit CDC's website at  PicCapture.uy CDC Pneumococcal Polysaccharide Vaccine VIS (12/13/13) This information is not intended to replace advice given to you by your health care provider. Make sure you discuss any questions you have with your health care provider. Document Released: 06/05/2006 Document Revised: 04/28/2016 Document Reviewed: 04/28/2016 Elsevier Interactive Patient Education  2017 Elsevier Inc. Influenza Virus Vaccine injection (Fluarix) What is this medicine? INFLUENZA VIRUS VACCINE (in floo EN zuh VAHY ruhs vak SEEN) helps to reduce the risk of getting influenza also known as the flu. This medicine may be used for other purposes; ask your health care provider or pharmacist if you have questions. COMMON BRAND NAME(S): Fluarix, Fluzone What should I tell my health care provider before I take this medicine? They need to know if you have any of these conditions: -bleeding disorder like hemophilia -fever or infection -Guillain-Barre syndrome or other neurological problems -immune system problems -infection with the human immunodeficiency virus (HIV) or AIDS -low blood platelet counts -multiple sclerosis -an unusual or allergic reaction to influenza virus vaccine, eggs, chicken proteins, latex, gentamicin, other medicines, foods, dyes or preservatives -pregnant or trying to get pregnant -breast-feeding How should I use this medicine? This vaccine is for injection into a muscle. It is given by a health care professional. A copy of Vaccine Information Statements will be given before each vaccination. Read this sheet carefully each time. The sheet may change frequently. Talk to your pediatrician regarding the use of this medicine in children. Special care may be needed. Overdosage: If you think you have taken too much of this medicine contact a poison control center or emergency room at once. NOTE: This medicine is only for you. Do not share this medicine with others. What if I miss a dose? This  does not apply. What may interact with this medicine? -chemotherapy or radiation therapy -medicines that lower your immune system like etanercept, anakinra, infliximab, and adalimumab -medicines that treat or prevent blood clots like warfarin -phenytoin -steroid medicines like prednisone or cortisone -theophylline -vaccines This list may not describe all possible interactions. Give your health care provider a list of all the medicines, herbs, non-prescription drugs, or dietary supplements you use. Also tell them if you smoke, drink alcohol, or use illegal drugs. Some items may interact with your medicine. What should I watch for while using this medicine? Report any side effects that do not go away within 3 days to your doctor or health care professional. Call your health care provider if any unusual symptoms occur within 6 weeks of receiving this vaccine. You may still catch the flu, but the  illness is not usually as bad. You cannot get the flu from the vaccine. The vaccine will not protect against colds or other illnesses that may cause fever. The vaccine is needed every year. What side effects may I notice from receiving this medicine? Side effects that you should report to your doctor or health care professional as soon as possible: -allergic reactions like skin rash, itching or hives, swelling of the face, lips, or tongue Side effects that usually do not require medical attention (report to your doctor or health care professional if they continue or are bothersome): -fever -headache -muscle aches and pains -pain, tenderness, redness, or swelling at site where injected -weak or tired This list may not describe all possible side effects. Call your doctor for medical advice about side effects. You may report side effects to FDA at 1-800-FDA-1088. Where should I keep my medicine? This vaccine is only given in a clinic, pharmacy, doctor's office, or other health care setting and will not be  stored at home. NOTE: This sheet is a summary. It may not cover all possible information. If you have questions about this medicine, talk to your doctor, pharmacist, or health care provider.  2018 Elsevier/Gold Standard (2008-03-05 09:30:40)

## 2018-06-09 LAB — BASIC METABOLIC PANEL
BUN/Creatinine Ratio: 16 (ref 9–20)
BUN: 19 mg/dL (ref 6–20)
CALCIUM: 10.1 mg/dL (ref 8.7–10.2)
CO2: 20 mmol/L (ref 20–29)
CREATININE: 1.19 mg/dL (ref 0.76–1.27)
Chloride: 97 mmol/L (ref 96–106)
GFR calc Af Amer: 94 mL/min/{1.73_m2} (ref >59)
GFR, EST NON AFRICAN AMERICAN: 81 mL/min/{1.73_m2} (ref >59)
GLUCOSE: 186 mg/dL — AB (ref 65–99)
POTASSIUM: 4.4 mmol/L (ref 3.5–5.2)
SODIUM: 139 mmol/L (ref 134–144)

## 2018-06-09 LAB — MICROALBUMIN / CREATININE URINE RATIO
CREATININE, UR: 214.2 mg/dL
Microalb/Creat Ratio: 256.7 mg/g creat — ABNORMAL HIGH (ref 0.0–30.0)
Microalbumin, Urine: 549.8 ug/mL

## 2018-06-14 ENCOUNTER — Encounter: Payer: Self-pay | Admitting: Internal Medicine

## 2018-06-14 ENCOUNTER — Ambulatory Visit: Payer: Medicaid Other | Admitting: Internal Medicine

## 2018-06-14 ENCOUNTER — Telehealth: Payer: Self-pay | Admitting: Internal Medicine

## 2018-06-14 NOTE — Telephone Encounter (Signed)
Patient came in and dropped off paper work so he can go back to work

## 2018-06-14 NOTE — Telephone Encounter (Signed)
Put It in pcp form box will contact pt once I receive paperwork

## 2018-06-14 NOTE — Telephone Encounter (Signed)
Patient came back in stating that he needs the paperwork filled out asap.

## 2018-06-15 ENCOUNTER — Telehealth: Payer: Self-pay | Admitting: Internal Medicine

## 2018-06-15 NOTE — Telephone Encounter (Signed)
Patient called back to see if he could get an update for the paperwork he needs completed. Patient was informed of 7-10 day processing time for paperwork. Please follow up with patient.

## 2018-06-18 ENCOUNTER — Telehealth: Payer: Self-pay | Admitting: Internal Medicine

## 2018-06-18 NOTE — Telephone Encounter (Signed)
Patient called to check on the status of his paperwork. Patient is aware of the 7-10 day processing time. Patient says he needs the paperwork completed by Friday because he was informed by his employer if it is not received by then it will be considered termination. Please follow up with patient.

## 2018-06-22 ENCOUNTER — Encounter: Payer: Self-pay | Admitting: Pharmacist

## 2018-06-22 ENCOUNTER — Other Ambulatory Visit: Payer: Self-pay

## 2018-06-22 ENCOUNTER — Encounter (HOSPITAL_COMMUNITY): Payer: Self-pay | Admitting: Emergency Medicine

## 2018-06-22 ENCOUNTER — Emergency Department (HOSPITAL_COMMUNITY)
Admission: EM | Admit: 2018-06-22 | Discharge: 2018-06-23 | Disposition: A | Discharge status: Home / Self Care | Payer: Medicaid Other | Attending: Emergency Medicine | Admitting: Emergency Medicine

## 2018-06-22 ENCOUNTER — Ambulatory Visit: Payer: Medicaid Other | Admitting: Pharmacist

## 2018-06-22 ENCOUNTER — Emergency Department (HOSPITAL_COMMUNITY): Payer: Medicaid Other

## 2018-06-22 DIAGNOSIS — I10 Essential (primary) hypertension: Secondary | ICD-10-CM

## 2018-06-22 DIAGNOSIS — I5042 Chronic combined systolic (congestive) and diastolic (congestive) heart failure: Secondary | ICD-10-CM | POA: Diagnosis not present

## 2018-06-22 DIAGNOSIS — R51 Headache: Secondary | ICD-10-CM | POA: Insufficient documentation

## 2018-06-22 DIAGNOSIS — Z79899 Other long term (current) drug therapy: Secondary | ICD-10-CM | POA: Insufficient documentation

## 2018-06-22 DIAGNOSIS — R519 Headache, unspecified: Secondary | ICD-10-CM

## 2018-06-22 DIAGNOSIS — Z7982 Long term (current) use of aspirin: Secondary | ICD-10-CM | POA: Diagnosis not present

## 2018-06-22 DIAGNOSIS — Z794 Long term (current) use of insulin: Secondary | ICD-10-CM | POA: Insufficient documentation

## 2018-06-22 DIAGNOSIS — R079 Chest pain, unspecified: Secondary | ICD-10-CM | POA: Insufficient documentation

## 2018-06-22 DIAGNOSIS — E119 Type 2 diabetes mellitus without complications: Secondary | ICD-10-CM | POA: Insufficient documentation

## 2018-06-22 DIAGNOSIS — I110 Hypertensive heart disease with heart failure: Secondary | ICD-10-CM | POA: Diagnosis not present

## 2018-06-22 DIAGNOSIS — H538 Other visual disturbances: Secondary | ICD-10-CM | POA: Diagnosis not present

## 2018-06-22 LAB — CBC
HCT: 43.8 % (ref 39.0–52.0)
Hemoglobin: 14.1 g/dL (ref 13.0–17.0)
MCH: 22.7 pg — ABNORMAL LOW (ref 26.0–34.0)
MCHC: 32.2 g/dL (ref 30.0–36.0)
MCV: 70.6 fL — ABNORMAL LOW (ref 80.0–100.0)
NRBC: 0 % (ref 0.0–0.2)
PLATELETS: 312 10*3/uL (ref 150–400)
RBC: 6.2 MIL/uL — AB (ref 4.22–5.81)
RDW: 14.1 % (ref 11.5–15.5)
WBC: 9.8 10*3/uL (ref 4.0–10.5)

## 2018-06-22 LAB — BASIC METABOLIC PANEL
ANION GAP: 13 (ref 5–15)
BUN: 14 mg/dL (ref 6–20)
CO2: 22 mmol/L (ref 22–32)
Calcium: 9.3 mg/dL (ref 8.9–10.3)
Chloride: 101 mmol/L (ref 98–111)
Creatinine, Ser: 1.17 mg/dL (ref 0.61–1.24)
Glucose, Bld: 140 mg/dL — ABNORMAL HIGH (ref 70–99)
Potassium: 3.6 mmol/L (ref 3.5–5.1)
SODIUM: 136 mmol/L (ref 135–145)

## 2018-06-22 LAB — I-STAT TROPONIN, ED: TROPONIN I, POC: 0.03 ng/mL (ref 0.00–0.08)

## 2018-06-22 NOTE — Progress Notes (Deleted)
   S: PCP: Dr. Laural Benes     Patient arrives ***.    Presents to the clinic for hypertension  management. Patient was last seen and referred by Dr. Laural Benes on 06/08/18.  BP elevated at 137/96. Hydralazine was increased to 75 mg TID.   ***CP, SOB, HA or blurred vision.   Patient {Actions; denies-reports:120008} adherence with medications.  Current BP Medications include:   - Amlodipine 10 mg daily - Carvedilol 25 mg BID - Hydralazine 50 mg: 1.5 tablet (75mg ) TID - Losartan-HCTZ 100-25 mg daily  Dietary habits include:  - Salt:  - Caffeine: Exercise habits include: - *** Family / Social history:  - FH: HTN, DM (unknown) - Tobacco: *** - Alcohol: ***  Home BP readings: ***  O:  L arm after 5 minutes rest: ***, HR ***  Last 3 Office BP readings: BP Readings from Last 3 Encounters:  06/08/18 (!) 137/96  02/23/18 132/90  02/14/18 125/84    BMET    Component Value Date/Time   NA 139 06/08/2018 1507   K 4.4 06/08/2018 1507   CL 97 06/08/2018 1507   CO2 20 06/08/2018 1507   GLUCOSE 186 (H) 06/08/2018 1507   GLUCOSE 83 02/07/2018 1251   BUN 19 06/08/2018 1507   CREATININE 1.19 06/08/2018 1507   CREATININE 1.05 11/25/2015 1324   CALCIUM 10.1 06/08/2018 1507   GFRNONAA 81 06/08/2018 1507   GFRAA 94 06/08/2018 1507    Renal function: CrCl cannot be calculated (Unknown ideal weight.).  A/P: Hypertension longstanding currently *** on current medications. BP Goal <130/80 mmHg. Patient {Is/is not:9024} adherent with current medications.   -{Meds adjust:18428} ***.  -Counseled on lifestyle modifications for blood pressure control including reduced dietary sodium, increased exercise, adequate sleep  Results reviewed and written information provided.   Total time in face-to-face counseling *** minutes.   F/U Clinic Visit in ***.  Patient seen with ***

## 2018-06-22 NOTE — ED Triage Notes (Addendum)
Pt to triage via [facility]> c/o headache that started at 3pm followed by 15 min of blurred vision that resolved.  Pt went to bed due to headache and woke up at 7pm with pain to center of chest with nausea.  History of CHF- no more SOB than usual.  EMS administered ASA 324 mg and NTG SL x1.  No neuro deficits noted on triage exam.

## 2018-06-23 NOTE — ED Provider Notes (Signed)
TIME SEEN: 1:26 AM  CHIEF COMPLAINT: Chest pain, headache, blurry vision  HPI: Patient is a 32 year old male with history of obesity, hypertension, CHF, diabetes who presents to the emergency department with complaints of gradual onset diffuse throbbing headache that started around 3 PM with about 15 minutes of blurry vision.  States around 7 PM he developed central chest tightness.  Thinks this is related to his blood pressure.  It was in the 190/120s at home.  Reports compliance with all of his medications.  He has been admitted for hypertensive emergency in the past.  States now that his blood pressure is better his symptoms are completely resolved.  He denies any shortness of breath, numbness, tingling, focal weakness, flashes or floaters in his vision, vision loss.  ROS: See HPI Constitutional: no fever  Eyes: no drainage  ENT: no runny nose   Cardiovascular:   chest pain  Resp: no SOB  GI: no vomiting GU: no dysuria Integumentary: no rash  Allergy: no hives  Musculoskeletal: no leg swelling  Neurological: no slurred speech ROS otherwise negative  PAST MEDICAL HISTORY/PAST SURGICAL HISTORY:  Past Medical History:  Diagnosis Date  . Acute combined systolic and diastolic CHF, NYHA class 3 (HCC) 11/18/2016   EF now 40-45% by echo  . Acute diastolic (congestive) heart failure (HCC) 01/12/2016  . Diabetes mellitus, new onset (HCC)   . Hypertension   . Morbid obesity (HCC)   . OSA (obstructive sleep apnea)     MEDICATIONS:  Prior to Admission medications   Medication Sig Start Date End Date Taking? Authorizing Provider  amLODipine (NORVASC) 10 MG tablet Take 1 tablet (10 mg total) by mouth daily. 02/14/18  Yes Kilroy, Eda Paschal, PA-C  aspirin 81 MG EC tablet Take 1 tablet (81 mg total) by mouth daily. 12/12/17  Yes Marcine Matar, MD  atorvastatin (LIPITOR) 40 MG tablet Take 1 tablet (40 mg total) by mouth daily at 6 PM. 02/14/18  Yes Kilroy, Eda Paschal, PA-C  carvedilol (COREG) 25 MG  tablet Take 1 tablet (25 mg total) by mouth 2 (two) times daily with a meal. 02/14/18  Yes Kilroy, Luke K, PA-C  furosemide (LASIX) 20 MG tablet Take 1 tablet (20 mg total) by mouth daily. 03/07/18  Yes Marcine Matar, MD  glipiZIDE (GLUCOTROL) 10 MG tablet Take 1 tablet (10 mg total) by mouth 2 (two) times daily before a meal. 12/12/17  Yes Marcine Matar, MD  hydrALAZINE (APRESOLINE) 50 MG tablet Take 1.5 tablets (75 mg total) by mouth 3 (three) times daily. 75 mg PO TID 06/08/18  Yes Marcine Matar, MD  Insulin Glargine (LANTUS SOLOSTAR) 100 UNIT/ML Solostar Pen Inject 14 Units into the skin daily at 10 pm. 06/08/18  Yes Marcine Matar, MD  losartan-hydrochlorothiazide (HYZAAR) 100-25 MG tablet Take 1 tablet by mouth daily. 06/08/18  Yes Marcine Matar, MD  metFORMIN (GLUCOPHAGE) 1000 MG tablet Take 1 tablet (1,000 mg total) by mouth 2 (two) times daily with a meal. 06/08/18  Yes Marcine Matar, MD  gabapentin (NEURONTIN) 300 MG capsule Take 1 capsule (300 mg total) by mouth 2 (two) times daily. Patient not taking: Reported on 06/23/2018 02/23/18   Marcine Matar, MD  Insulin Pen Needle 31G X 8 MM MISC Use as directed. 12/12/17   Marcine Matar, MD    ALLERGIES:  Allergies  Allergen Reactions  . Imdur [Isosorbide Dinitrate] Other (See Comments)    Headaches   . Penicillins Other (See Comments)  From childhood; reaction not known: Has patient had a PCN reaction causing immediate rash, facial/tongue/throat swelling, SOB or lightheadedness with hypotension: Unk Has patient had a PCN reaction causing severe rash involving mucus membranes or skin necrosis: Unk Has patient had a PCN reaction that required hospitalization: Unk Has patient had a PCN reaction occurring within the last 10 years: No If all of the above answers are "NO", then may proceed with Cephalosporin use.     SOCIAL HISTORY:  Social History   Tobacco Use  . Smoking status: Never Smoker  .  Smokeless tobacco: Never Used  Substance Use Topics  . Alcohol use: Yes    Alcohol/week: 0.0 standard drinks    FAMILY HISTORY: Family History  Problem Relation Age of Onset  . Hypertension Unknown   . Diabetes Mellitus II Unknown     EXAM: BP 132/85   Pulse 69   Temp 98.8 F (37.1 C) (Oral)   Resp 19   SpO2 96%  CONSTITUTIONAL: Alert and oriented and responds appropriately to questions. Well-appearing; well-nourished HEAD: Normocephalic EYES: Conjunctivae clear, pupils appear equal, EOMI ENT: normal nose; moist mucous membranes NECK: Supple, no meningismus, no nuchal rigidity, no LAD  CARD: RRR; S1 and S2 appreciated; no murmurs, no clicks, no rubs, no gallops RESP: Normal chest excursion without splinting or tachypnea; breath sounds clear and equal bilaterally; no wheezes, no rhonchi, no rales, no hypoxia or respiratory distress, speaking full sentences ABD/GI: Normal bowel sounds; non-distended; soft, non-tender, no rebound, no guarding, no peritoneal signs, no hepatosplenomegaly BACK:  The back appears normal and is non-tender to palpation, there is no CVA tenderness EXT: Normal ROM in all joints; non-tender to palpation; no edema; normal capillary refill; no cyanosis, no calf tenderness or swelling    SKIN: Normal color for age and race; warm; no rash NEURO: Moves all extremities equally, normal sensation diffusely, cranial nerves II through XII intact, normal speech PSYCH: The patient's mood and manner are appropriate. Grooming and personal hygiene are appropriate.  MEDICAL DECISION MAKING: Patient here with headache, blurry vision, chest pain in association with hypertension at home.  Blood pressure now normal.  He is now asymptomatic.  Work-up here has been unremarkable including normal EKG, negative troponin, clear chest x-ray.  No focal neurologic deficits on exam.  Doubt stroke or intracranial hemorrhage.  I do not feel he needs emergent head imaging.  Recommend close  follow-up with his primary care physician.  He verbalized understanding.  He will check his blood pressures closely at home.  At this time, I do not feel there is any life-threatening condition present. I have reviewed and discussed all results (EKG, imaging, lab, urine as appropriate) and exam findings with patient/family. I have reviewed nursing notes and appropriate previous records.  I feel the patient is safe to be discharged home without further emergent workup and can continue workup as an outpatient as needed. Discussed usual and customary return precautions. Patient/family verbalize understanding and are comfortable with this plan.  Outpatient follow-up has been provided if needed. All questions have been answered.      EKG Interpretation  Date/Time:  Friday June 22 2018 21:48:04 EDT Ventricular Rate:  84 PR Interval:  172 QRS Duration: 98 QT Interval:  366 QTC Calculation: 432 R Axis:   39 Text Interpretation:  Normal sinus rhythm Cannot rule out Anterior infarct , age undetermined Abnormal ECG No significant change since last tracing Confirmed by Olegario Emberson, Baxter Hire [phone]) on 06/23/2018 1:00:12 AM  Marlynn Hinckley, Layla Maw, DO 06/23/18 0430

## 2018-06-25 ENCOUNTER — Ambulatory Visit: Payer: Medicaid Other | Attending: Internal Medicine | Admitting: Internal Medicine

## 2018-06-25 VITALS — BP 156/110 | HR 81 | Temp 98.5°F | Resp 16 | Wt >= 6400 oz

## 2018-06-25 DIAGNOSIS — Z794 Long term (current) use of insulin: Secondary | ICD-10-CM | POA: Insufficient documentation

## 2018-06-25 DIAGNOSIS — Z79899 Other long term (current) drug therapy: Secondary | ICD-10-CM | POA: Diagnosis not present

## 2018-06-25 DIAGNOSIS — G4733 Obstructive sleep apnea (adult) (pediatric): Secondary | ICD-10-CM | POA: Insufficient documentation

## 2018-06-25 DIAGNOSIS — E1142 Type 2 diabetes mellitus with diabetic polyneuropathy: Secondary | ICD-10-CM | POA: Insufficient documentation

## 2018-06-25 DIAGNOSIS — I110 Hypertensive heart disease with heart failure: Secondary | ICD-10-CM | POA: Diagnosis not present

## 2018-06-25 DIAGNOSIS — Z833 Family history of diabetes mellitus: Secondary | ICD-10-CM | POA: Diagnosis not present

## 2018-06-25 DIAGNOSIS — Z6841 Body Mass Index (BMI) 40.0 and over, adult: Secondary | ICD-10-CM | POA: Insufficient documentation

## 2018-06-25 DIAGNOSIS — IMO0002 Reserved for concepts with insufficient information to code with codable children: Secondary | ICD-10-CM

## 2018-06-25 DIAGNOSIS — N529 Male erectile dysfunction, unspecified: Secondary | ICD-10-CM | POA: Diagnosis not present

## 2018-06-25 DIAGNOSIS — I10 Essential (primary) hypertension: Secondary | ICD-10-CM

## 2018-06-25 DIAGNOSIS — E1165 Type 2 diabetes mellitus with hyperglycemia: Secondary | ICD-10-CM | POA: Diagnosis not present

## 2018-06-25 DIAGNOSIS — Z880 Allergy status to penicillin: Secondary | ICD-10-CM | POA: Insufficient documentation

## 2018-06-25 DIAGNOSIS — Z7982 Long term (current) use of aspirin: Secondary | ICD-10-CM | POA: Insufficient documentation

## 2018-06-25 DIAGNOSIS — F329 Major depressive disorder, single episode, unspecified: Secondary | ICD-10-CM | POA: Insufficient documentation

## 2018-06-25 MED ORDER — INSULIN GLARGINE 100 UNIT/ML SOLOSTAR PEN: 17.0000 [IU] | PEN_INJECTOR | Freq: Every day | SUBCUTANEOUS | 99 refills | Status: DC

## 2018-06-25 MED ORDER — HYDRALAZINE HCL 100 MG PO TABS: 100.0000 mg | ORAL_TABLET | Freq: Three times a day (TID) | ORAL | 6 refills | Status: DC

## 2018-06-25 NOTE — Progress Notes (Signed)
Patient ID: Paul Lamb, male    DOB: March 07, 1986  MRN: [id number]  CC: Follow-up   Subjective: Paul Lamb is a 32 y.o. male who presents for f/u DM.  His wife is with him. His concerns today include:  Patient with history ofHTN,diastolic CHF with EF of50-55%, HTN, OSA on CPAP, HL, morbid obesity, diabetes, depression.  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  They inquired with the patient has electronic glucometer that allows for download of the information and whether patient brings in log of his blood sugar readings.  On last 2 visits he did not bring in logs so that was indicated on the form.  Patient was subsequently told by DOT that he needs to present me with logs of blood sugar readings over he would have to be taken off insulin. -He presents today with his logbook with readings from July 1-Jun 08, 2018.  A.m. range is 150-170s, after lunch 120-140s and bedtime 100-186.   He reports that he continue to check his blood sugars 3 times a day after Oct 18th  using a meter that he got from his mother but when he tried to set the correct date on the meter it ended up erasing his readings from October 19 forward.  He just purchased a Chief Operating [employer]  ([employer]) 2 days ago.  He has this meter with him today and readings on the higher are 99, 181, 91, 170, and 120.   Denies any hypoglycemic episodes. Eating habits:  3 x a day, smaller portions.  He eats wheat bread, does not eat rice. Drinks mostly water Walking 2 miles most days or at least QOD Reports having had had eye exam at [facility] 3 wks ago.  No changes from DM.  He states that he carried a copy of the eye exam to DOT  Patient seen in the emergency room 2 days ago with headache, blurred vision, chest pain and elevated blood pressure.  Work-up including EKG,  chest x-ray and cardiac enzymes were negative.  Patient reports that he was a bit stressed out because he was being called constantly by [employer] for overdue bills.  He checks his blood pressure twice a day.  Last reading of 138/97 was yesterday evening He reports no further chest pains.  No shortness of breath, PND. Reports compliance with medications. Patient Active Problem List   Diagnosis Date Noted  . Diabetic polyneuropathy associated with type 2 diabetes mellitus (HCC) 02/23/2018  . Diabetes mellitus type 2 in obese (HCC) 12/29/2017  . Diastolic congestive heart failure (HCC) 11/28/2017  . Proteinuria   . Erectile dysfunction 05/19/2017  . Depression 05/19/2017  . Essential hypertension 01/12/2016  . Sleep apnea 01/12/2016  . Abnormal EKG   . Hand pain, left 11/01/2015  . Morbid obesity (HCC) 11/01/2015     Current Outpatient Medications on File Prior to Visit  Medication Sig Dispense Refill  . amLODipine (NORVASC) 10 MG tablet Take 1 tablet (10 mg total) by mouth daily. 30 tablet 6  . aspirin 81 MG EC tablet Take 1 tablet (81 mg total) by mouth daily. 100 tablet 1  . atorvastatin (LIPITOR) 40 MG tablet Take 1 tablet (40 mg total) by mouth daily at 6 PM. 30 tablet 6  . carvedilol (COREG) 25 MG tablet Take 1  tablet (25 mg total) by mouth 2 (two) times daily with a meal. 60 tablet 6  . furosemide (LASIX) 20 MG tablet Take 1 tablet (20 mg total) by mouth daily. 30 tablet 3  . gabapentin (NEURONTIN) 300 MG capsule Take 1 capsule (300 mg total) by mouth 2 (two) times daily. (Patient not taking: Reported on 06/23/2018) 60 capsule 6  . glipiZIDE (GLUCOTROL) 10 MG tablet Take 1 tablet (10 mg total) by mouth 2 (two) times daily before a meal. 60 tablet 6  . hydrALAZINE (APRESOLINE) 50 MG tablet Take 1.5 tablets (75 mg total) by mouth 3 (three) times daily. 75 mg PO TID 135 tablet 6  . Insulin Glargine (LANTUS SOLOSTAR) 100 UNIT/ML Solostar Pen Inject 14 Units into the skin daily at 10  pm. 5 pen PRN  . Insulin Pen Needle 31G X 8 MM MISC Use as directed. 100 each 4  . losartan-hydrochlorothiazide (HYZAAR) 100-25 MG tablet Take 1 tablet by mouth daily. 30 tablet 3  . metFORMIN (GLUCOPHAGE) 1000 MG tablet Take 1 tablet (1,000 mg total) by mouth 2 (two) times daily with a meal. 60 tablet 5   No current facility-administered medications on file prior to visit.     Allergies  Allergen Reactions  . Imdur [Isosorbide Dinitrate] Other (See Comments)    Headaches   . Penicillins Other (See Comments)    From childhood; reaction not known: Has patient had a PCN reaction causing immediate rash, facial/tongue/throat swelling, SOB or lightheadedness with hypotension: Unk Has patient had a PCN reaction causing severe rash involving mucus membranes or skin necrosis: Unk Has patient had a PCN reaction that required hospitalization: Unk Has patient had a PCN reaction occurring within the last 10 years: No If all of the above answers are "NO", then may proceed with Cephalosporin use.     Social History   Socioeconomic History  . Marital status: Single    Spouse name: Not on file  . Number of children: Not on file  . Years of education: Not on file  . Highest education level: Not on file  Occupational History  . Not on file  Social Needs  . Financial resource strain: Not on file  . Food insecurity:    Worry: Not on file    Inability: Not on file  . Transportation needs:    Medical: Not on file    Non-medical: Not on file  Tobacco Use  . Smoking status: Never Smoker  . Smokeless tobacco: Never Used  Substance and Sexual Activity  . Alcohol use: Yes    Alcohol/week: 0.0 standard drinks  . Drug use: No  . Sexual activity: Yes  Lifestyle  . Physical activity:    Days per week: 0 days    Minutes per session: 0 min  . Stress: To some extent  Relationships  . Social connections:    Talks on phone: Patient refused    Gets together: Patient refused    Attends religious  service: Patient refused    Active member of club or organization: Patient refused    Attends meetings of clubs or organizations: Patient refused    Relationship status: Patient refused  . Intimate partner violence:    Fear of current or ex partner: No    Emotionally abused: No    Physically abused: No    Forced sexual activity: No  Other Topics Concern  . Not on file  Social History Narrative  . Not on file    Family History  Problem Relation Age of Onset  . Hypertension Unknown   . Diabetes Mellitus II Unknown     Past Surgical History:  Procedure Laterality Date  . NO PAST SURGERIES      ROS: Review of Systems Negative except as above PHYSICAL EXAM: BP (!) 156/110 (BP Location: Left Arm) Comment: recheck Comment (Cuff Size): thigh  Pulse 81   Temp 98.5 F (36.9 C) (Oral)   Resp 16   Wt (!) 414 lb 9.6 oz (188.1 kg)   SpO2 95%   BMI 53.23 kg/m   Physical Exam BP 160/100  General appearance - alert, well appearing, and in no distress Mental status - normal mood, behavior, speech, dress, motor activity, and thought processes Chest - clear to auscultation, no wheezes, rales or rhonchi, symmetric air entry Heart - normal rate, regular rhythm, normal S1, S2, no murmurs, rubs, clicks or gallops Extremities - peripheral pulses normal, no pedal edema, no clubbing or cyanosis   ASSESSMENT AND PLAN:  1. Essential hypertension Not at goal.  Increase hydralazine to 100 mg 3 times a day Continue to monitor blood pressure with goal being 130/80 or lower. - hydrALAZINE (APRESOLINE) 100 MG tablet; Take 1 tablet (100 mg total) by mouth 3 (three) times daily.   Dispense: 90 tablet; Refill: 6  2. Uncontrolled type 2 diabetes mellitus with peripheral neuropathy (HCC) -I have documented that patient brought in his log book with him today and now has an electronic meter.  Advised him to continue to check blood sugars 3 times a day.  Increase Lantus from 14 units daily to 17 units  daily. Continue to encourage healthy eating habits.  Encouraged him to continue regular exercise.  I have requested that he bring a copy of his eye exam report for me. 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  Dispense: 5 pen; Refill: PRN  Patient was given the opportunity to ask questions.  Patient verbalized understanding of the plan and was able to repeat key elements of the plan.   No orders of the defined types were placed in this encounter.    Requested Prescriptions    No prescriptions requested or ordered in this encounter    No follow-ups on file.  [name], MD, FACP

## 2018-06-25 NOTE — Patient Instructions (Signed)
Increase Lantus insulin to 17 units daily.  Continue to monitor your blood sugars.  The goal before meals is 90-130.  If you check it after meals, check a 2 hours after meal.  It should be less than 180.  Increase hydralazine to 100 mg daily.  Continue to monitor your blood pressure.  Goal is 130/80 or lower.

## 2018-06-28 ENCOUNTER — Telehealth: Payer: Self-pay

## 2018-06-28 NOTE — Telephone Encounter (Signed)
Received a fax from Joy from [facility]. On the fax cover sheet it stated that "we need a statement that it was visually seen electronically 3 months of glucose readings not manually written".    I contacted Joy after receiving the fax and asked her to clarify what she is needing. Per Joy she is needing to where we can download the patients sugar to the computer and print them out and send it with the paperwork.   I informed Joy that we have our patients write their glucose down every time they checked it and bring it with them to their next appointment.   Per Ander Slade that is not acceptable and in order for pt to comply he will need to have a glucometer that is able to download sugars to the computer and again I tried to explain to her that our pt doesn't have a glucometer that can be downloaded.   I hung up the phone with Joy and contacted Dr. Laural Benes to inform her of the situation. I explained to Dr. Laural Benes everything that Ander Slade stated over the phone and what she is needing for the patient  Per Dr. Laural Benes during our phone call we feel that they don't want patient on insulin at all. I explained to Dr. Laural Benes that is is basically their way or no way.   Per Dr. Laural Benes she doesn't want to take him off insulin because of his CHF and his sugars may get out of wack while being off insulin  Per Dr. Brynda Peon pt and see if he wants to be on or off insulin and if he wants to be off insulin she will need to look and see what medication would be appropriate for him   Contacted pt to to go over the situation. I explained to the pt what I was told from [place]. I informed pt that I have made Dr. Laural Benes aware and she is wanting to know if pt would like to be on or insulin because if the situation that is going on  Pt states he feels that they are giving him the run around. Pt stated that they informed him of something totally different then what they told me. Pt stated they told him  that he can bring his sugars in later.  Pt stated that the decision is hard to make because of his condition and he does see where Dr. Laural Benes is coming from regarding the insulin.  Pt states that he would like to try be off insulin for a month and see what his sugars are while being off insulin but he sugars are not where they need to be pt states he would like to be ut back on insulin  I informed pt that I will send Dr. Laural Benes a message regarding his decision and I will contact him once Dr. Laural Benes has made a decision on the mediations

## 2018-06-29 ENCOUNTER — Encounter

## 2018-06-30 MED ORDER — DAPAGLIFLOZIN PROPANEDIOL 5 MG PO TABS: 5.0000 mg | ORAL_TABLET | Freq: Every day | ORAL | 4 refills | Status: DC

## 2018-07-02 NOTE — Telephone Encounter (Signed)
Contacted pt and made aware of Dr. Laural Benes response pt doesn't have any questions or concerns

## 2018-07-04 ENCOUNTER — Encounter: Payer: Self-pay | Admitting: Internal Medicine

## 2018-07-04 NOTE — Progress Notes (Signed)
Letter written for pt

## 2018-07-05 ENCOUNTER — Telehealth: Payer: Self-pay

## 2018-07-05 ENCOUNTER — Encounter: Payer: Self-pay | Admitting: Internal Medicine

## 2018-07-05 NOTE — Telephone Encounter (Signed)
Contacted pt and made aware that letter is ready for pickup. Pt states he will have his wife come by and pickup letter

## 2018-07-05 NOTE — Progress Notes (Signed)
Letter edited

## 2018-07-27 ENCOUNTER — Other Ambulatory Visit: Payer: Self-pay | Admitting: Internal Medicine

## 2018-07-30 ENCOUNTER — Other Ambulatory Visit: Payer: Self-pay | Admitting: Internal Medicine

## 2018-07-30 NOTE — Telephone Encounter (Signed)
Rx was sent on 07/28/18 but was not received by the pharmacy. The rx was resent exactly as Dr. Laural Benes originally sent it on 07/28/18

## 2018-08-13 MED FILL — metFORMIN HCL 1000 MG TABS: 1000 | 30 days supply | Qty: 60 | Fill #1

## 2018-08-13 MED FILL — LOSARTAN-HCTZ 100-25 MG TAB: 100-25 | 30 days supply | Qty: 30 | Fill #1

## 2018-08-13 MED FILL — hydrALAZINE HCL 50 MG TABS: 50 | 30 days supply | Qty: 135 | Fill #1

## 2018-08-13 MED FILL — LANTUS SOLOSTAR 100 UNITS/M: 100 | 30 days supply | Qty: 15 | Fill #1

## 2018-08-13 MED FILL — GABAPENTIN 300 MG CAPSULE: 300 | 30 days supply | Qty: 60 | Fill #2

## 2018-08-13 MED FILL — FARXIGA 5 MG TABLET: 5 | 30 days supply | Qty: 30 | Fill #0

## 2018-08-17 MED FILL — AMLODIPINE BESYLATE 10 MG T: 10 | 30 days supply | Qty: 30 | Fill #2

## 2018-08-17 MED FILL — FUROSEMIDE 20 MG TABLET: 20 | 30 days supply | Qty: 30 | Fill #2

## 2018-08-20 ENCOUNTER — Encounter: Payer: Self-pay | Admitting: Internal Medicine

## 2018-08-20 DIAGNOSIS — IMO0002 Reserved for concepts with insufficient information to code with codable children: Secondary | ICD-10-CM

## 2018-08-20 DIAGNOSIS — E1142 Type 2 diabetes mellitus with diabetic polyneuropathy: Secondary | ICD-10-CM

## 2018-08-20 DIAGNOSIS — E1165 Type 2 diabetes mellitus with hyperglycemia: Principal | ICD-10-CM

## 2018-08-20 MED ORDER — INSULIN GLARGINE 100 UNIT/ML SOLOSTAR PEN: 17.0000 [IU] | PEN_INJECTOR | Freq: Every day | SUBCUTANEOUS | 99 refills | Status: DC

## 2018-08-20 NOTE — Telephone Encounter (Signed)
Mychart concern

## 2018-08-21 ENCOUNTER — Other Ambulatory Visit: Payer: Self-pay | Admitting: Internal Medicine

## 2018-08-25 NOTE — Telephone Encounter (Signed)
RX has been repeatedly sent to pharmacy and is not being received. I will give this as a verbal order on Monday.

## 2018-09-07 ENCOUNTER — Ambulatory Visit: Payer: Medicaid Other | Attending: Internal Medicine | Admitting: Internal Medicine

## 2018-09-07 ENCOUNTER — Encounter: Payer: Self-pay | Admitting: Internal Medicine

## 2018-09-07 ENCOUNTER — Other Ambulatory Visit: Payer: Self-pay

## 2018-09-07 VITALS — BP 136/89 | HR 90 | Temp 98.4°F | Resp 16 | Wt >= 6400 oz

## 2018-09-07 DIAGNOSIS — Z880 Allergy status to penicillin: Secondary | ICD-10-CM | POA: Diagnosis not present

## 2018-09-07 DIAGNOSIS — Z8249 Family history of ischemic heart disease and other diseases of the circulatory system: Secondary | ICD-10-CM | POA: Insufficient documentation

## 2018-09-07 DIAGNOSIS — I10 Essential (primary) hypertension: Secondary | ICD-10-CM

## 2018-09-07 DIAGNOSIS — E1142 Type 2 diabetes mellitus with diabetic polyneuropathy: Secondary | ICD-10-CM | POA: Insufficient documentation

## 2018-09-07 DIAGNOSIS — Z833 Family history of diabetes mellitus: Secondary | ICD-10-CM | POA: Insufficient documentation

## 2018-09-07 DIAGNOSIS — Z79899 Other long term (current) drug therapy: Secondary | ICD-10-CM | POA: Diagnosis not present

## 2018-09-07 DIAGNOSIS — I110 Hypertensive heart disease with heart failure: Secondary | ICD-10-CM | POA: Diagnosis not present

## 2018-09-07 DIAGNOSIS — Z794 Long term (current) use of insulin: Secondary | ICD-10-CM | POA: Diagnosis not present

## 2018-09-07 DIAGNOSIS — Z888 Allergy status to other drugs, medicaments and biological substances status: Secondary | ICD-10-CM | POA: Diagnosis not present

## 2018-09-07 DIAGNOSIS — E1169 Type 2 diabetes mellitus with other specified complication: Secondary | ICD-10-CM

## 2018-09-07 DIAGNOSIS — G4733 Obstructive sleep apnea (adult) (pediatric): Secondary | ICD-10-CM | POA: Diagnosis not present

## 2018-09-07 DIAGNOSIS — Z6841 Body Mass Index (BMI) 40.0 and over, adult: Secondary | ICD-10-CM | POA: Diagnosis not present

## 2018-09-07 DIAGNOSIS — Z7982 Long term (current) use of aspirin: Secondary | ICD-10-CM | POA: Diagnosis not present

## 2018-09-07 DIAGNOSIS — E669 Obesity, unspecified: Secondary | ICD-10-CM | POA: Diagnosis not present

## 2018-09-07 DIAGNOSIS — N529 Male erectile dysfunction, unspecified: Secondary | ICD-10-CM | POA: Insufficient documentation

## 2018-09-07 DIAGNOSIS — M25571 Pain in right ankle and joints of right foot: Secondary | ICD-10-CM | POA: Insufficient documentation

## 2018-09-07 LAB — GLUCOSE, POCT (MANUAL RESULT ENTRY): POC GLUCOSE: 83 mg/dL (ref 70–99)

## 2018-09-07 LAB — POCT GLYCOSYLATED HEMOGLOBIN (HGB A1C): HbA1c, POC (controlled diabetic range): 8.3 % — AB (ref 0.0–7.0)

## 2018-09-07 MED ORDER — INSULIN GLARGINE 100 UNIT/ML SOLOSTAR PEN: 19.0000 [IU] | PEN_INJECTOR | Freq: Every day | SUBCUTANEOUS | 99 refills | Status: DC

## 2018-09-07 NOTE — Progress Notes (Signed)
Patient ID: Paul Lamb, male    DOB: 08-24-1985  MRN: [id number]  CC: Follow-up   Subjective: Paul Lamb is a 33 y.o. male who presents for f/u DM His concerns today include:  Patient with history ofHTN,diastolic CHF with EF of50-55%, HTN, OSA on CPAP, HL, morbid obesity, diabetes, depression.  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  I had him stop the Lantus reluctantly and added [place].  However patient contacted [place] again the end of last month stating that his blood sugars have been more out of control since being off of Lantus.  He wanted to restart the Lantus.  I told him to restart at 17 units daily which he has been doing.  Reports compliance with oral medications including Marcelline Deist he checks blood sugars twice a day and brings in a log with him that dates back to June 24, 2018.  Looks like morning blood sugars have been between 130-196.  Eating twice a day.  Meats are grilled.  He has cut out pork.  He is walking a little bit more.  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   HTN:  Checks blood pressure 1-2 x a day.   Gives range 140/80-90.  He is limiting salt in the foods.  Reports compliance with medications.  Complains of pain in the right ankle x4 days.  Pain is more laterally and over the midfoot.  No initiating factors.  No erythema.  No history of gout.  It was a bit worse today so he wanted to have it evaluated.  He is wearing an ankle support.  Patient Active Problem List   Diagnosis Date Noted  . Diabetic polyneuropathy associated with type 2 diabetes mellitus (HCC) 02/23/2018  . Diabetes mellitus type 2 in obese (HCC) 12/29/2017  . Diastolic congestive heart failure (HCC) 11/28/2017  . Proteinuria   . Erectile dysfunction 05/19/2017  . Depression 05/19/2017  . Essential  hypertension 01/12/2016  . Sleep apnea 01/12/2016  . Abnormal EKG   . Hand pain, left 11/01/2015  . Morbid obesity (HCC) 11/01/2015     Current Outpatient Medications on File Prior to Visit  Medication Sig Dispense Refill  . amLODipine (NORVASC) 10 MG tablet Take 1 tablet (10 mg total) by mouth daily. 30 tablet 6  . aspirin 81 MG EC tablet Take 1 tablet (81 mg total) by mouth daily. 100 tablet 1  . atorvastatin (LIPITOR) 40 MG tablet Take 1 tablet (40 mg total) by mouth daily at 6 PM. 30 tablet 6  . carvedilol (COREG) 25 MG tablet Take 1 tablet (25 mg total) by mouth 2 (two) times daily with a meal. 60 tablet 6  . dapagliflozin propanediol (FARXIGA) 5 MG TABS tablet Take 5 mg by mouth daily. 30 tablet 4  . furosemide (LASIX) 20 MG tablet Take 1 tablet (20 mg total) by mouth daily. 30 tablet 3  . gabapentin (NEURONTIN) 300 MG capsule Take 1 capsule (300 mg total) by mouth 2 (two) times daily. 60 capsule 6  . glipiZIDE (GLUCOTROL) 10 MG tablet Take 1 tablet (10 mg total) by mouth 2 (two) times daily before a meal. 60 tablet 6  . hydrALAZINE (APRESOLINE) 100 MG tablet Take 1 tablet (100 mg total) by mouth 3 (three) times daily. 75 mg PO TID 90 tablet  6  . Insulin Pen Needle 31G X 8 MM MISC Use as directed. 100 each 4  . losartan-hydrochlorothiazide (HYZAAR) 100-25 MG tablet Take 1 tablet by mouth daily. 30 tablet 3  . metFORMIN (GLUCOPHAGE) 1000 MG tablet Take 1 tablet (1,000 mg total) by mouth 2 (two) times daily with a meal. 60 tablet 5  . VIAGRA 100 MG tablet TAKE 1 TABLET BY MOUTH 30 MINUTES BEFORE INTERCOURSE AS NEEDED. LIMIT TO ONE TABLET PER DAY 30 tablet 3   No current facility-administered medications on file prior to visit.     Allergies  Allergen Reactions  . Imdur [Isosorbide Dinitrate] Other (See Comments)    Headaches   . Penicillins Other (See Comments)    From childhood; reaction not known: Has patient had a PCN reaction causing immediate rash, facial/tongue/throat  swelling, SOB or lightheadedness with hypotension: Unk Has patient had a PCN reaction causing severe rash involving mucus membranes or skin necrosis: Unk Has patient had a PCN reaction that required hospitalization: Unk Has patient had a PCN reaction occurring within the last 10 years: No If all of the above answers are "NO", then may proceed with Cephalosporin use.     Social History   Socioeconomic History  . Marital status: Single    Spouse name: Not on file  . Number of children: Not on file  . Years of education: Not on file  . Highest education level: Not on file  Occupational History  . Not on file  Social Needs  . Financial resource strain: Not on file  . Food insecurity:    Worry: Not on file    Inability: Not on file  . Transportation needs:    Medical: Not on file    Non-medical: Not on file  Tobacco Use  . Smoking status: Never Smoker  . Smokeless tobacco: Never Used  Substance and Sexual Activity  . Alcohol use: Yes    Alcohol/week: 0.0 standard drinks  . Drug use: No  . Sexual activity: Yes  Lifestyle  . Physical activity:    Days per week: 0 days    Minutes per session: 0 min  . Stress: To some extent  Relationships  . Social connections:    Talks on phone: Patient refused    Gets together: Patient refused    Attends religious service: Patient refused    Active member of club or organization: Patient refused    Attends meetings of clubs or organizations: Patient refused    Relationship status: Patient refused  . Intimate partner violence:    Fear of current or ex partner: No    Emotionally abused: No    Physically abused: No    Forced sexual activity: No  Other Topics Concern  . Not on file  Social History Narrative  . Not on file    Family History  Problem Relation Age of Onset  . Hypertension Unknown   . Diabetes Mellitus II Unknown     Past Surgical History:  Procedure Laterality Date  . NO PAST SURGERIES      ROS: Review of  Systems Negative except as above. PHYSICAL EXAM: BP 136/89   Pulse 90   Temp 98.4 F (36.9 C) (Oral)   Resp 16   Wt (!) 418 lb (189.6 kg)   SpO2 96%   BMI 53.67 kg/m   Repeat blood pressure BP 132/90 Physical Exam General appearance - alert, well appearing, and in no distress Mental status - normal mood, behavior, speech, dress, motor  activity, and thought processes Neck - supple, no significant adenopathy Chest - clear to auscultation, no wheezes, rales or rhonchi, symmetric air entry Heart - normal rate, regular rhythm, normal S1, S2, no murmurs, rubs, clicks or gallops Musculoskeletal -right ankle: No edema noted.  He has mild tenderness around the lateral malleolus and the midfoot.  Discomfort with inversion of the foot  Results for orders placed or performed in visit on 09/07/18  POCT glucose (manual entry)  Result Value Ref Range   POC Glucose 83 70 - 99 mg/dl  POCT glycosylated hemoglobin (Hb A1C)  Result Value Ref Range   Hemoglobin A1C     HbA1c POC (<> result, manual entry)     HbA1c, POC (prediabetic range)     HbA1c, POC (controlled diabetic range) 8.3 (A) 0.0 - 7.0 %    ASSESSMENT AND PLAN:  1. Diabetes mellitus type 2 in obese [facility]) Discussed the importance of healthy eating habits, regular aerobic exercise (at least 150 minutes a week as tolerated) and medication compliance to achieve or maintain control of diabetes. -Increase Lantus to 19 units daily. I have kept a copy of the electronic log of his blood sugar readings. 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 - POCT glucose (manual entry) - POCT glycosylated hemoglobin (Hb A1C)  2. Essential hypertension Not at goal but this range is okay for him.  Advised to continue current medications and low-salt diet  3. Acute right ankle pain Questionable etiology.  Does not appear to be gout.  He denies any injury.  I  recommend elevation and taking Tylenol as needed.    Patient was given the opportunity to ask questions.  Patient verbalized understanding of the plan and was able to repeat key elements of the plan.   Orders Placed This Encounter  Procedures  . POCT glucose (manual entry)  . POCT glycosylated hemoglobin (Hb A1C)     Requested Prescriptions   Signed Prescriptions Disp Refills  . Insulin Glargine (LANTUS SOLOSTAR) 100 UNIT/ML Solostar Pen 5 pen PRN    Sig: Inject 19 Units into the skin daily at 10 pm.    Return in about 2 months (around 11/06/2018).  [name], MD, FACP

## 2018-09-07 NOTE — Progress Notes (Signed)
F/u DM  Right ankle pain x 3 days.

## 2018-09-10 IMAGING — CT CT HEAD W/O CM
4 series · 15 of 47 positions shown, 17 images · non-contrast
Comparison: 12/13/2006

CLINICAL DATA: Acute severe worst headache of life.

EXAM:
CT HEAD WITHOUT CONTRAST
TECHNIQUE: Contiguous axial images were obtained from the base of the skull
through the vertex without intravenous contrast.

[Series 3: head wo · axial · 0.50mm/px · z∈[-166,-31]mm · 7 of 37 slices shown, 9 images]
[im 5/37  brain]
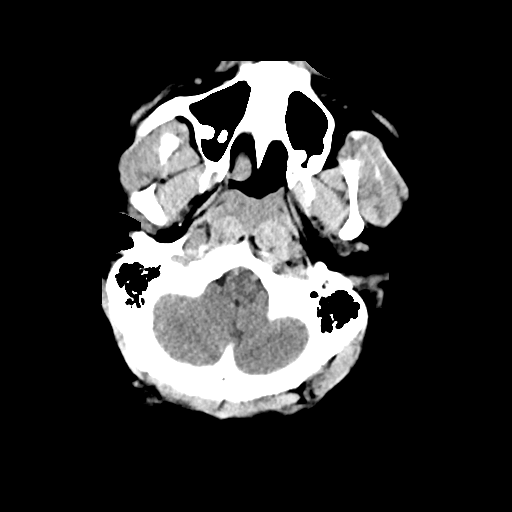
[im 5/37  bone]
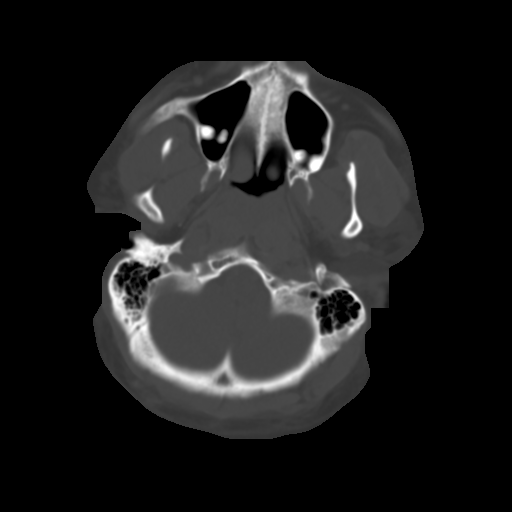
[im 10/37  brain]
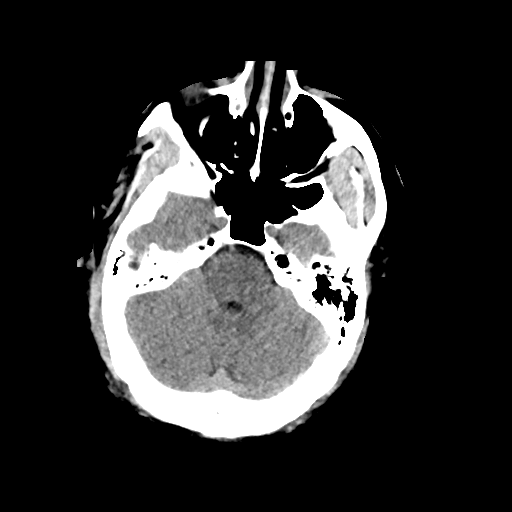
[im 14/37  brain]
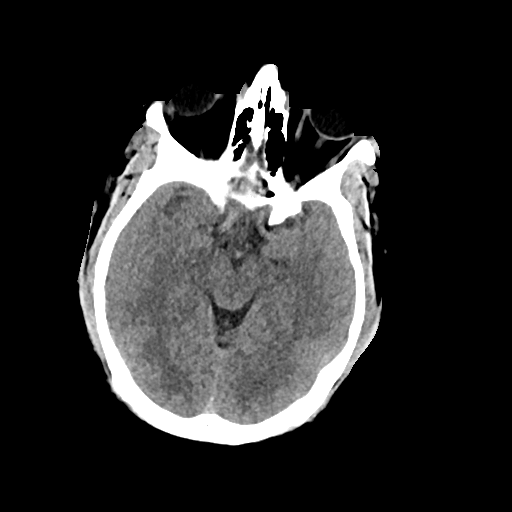
[im 19/37  brain]
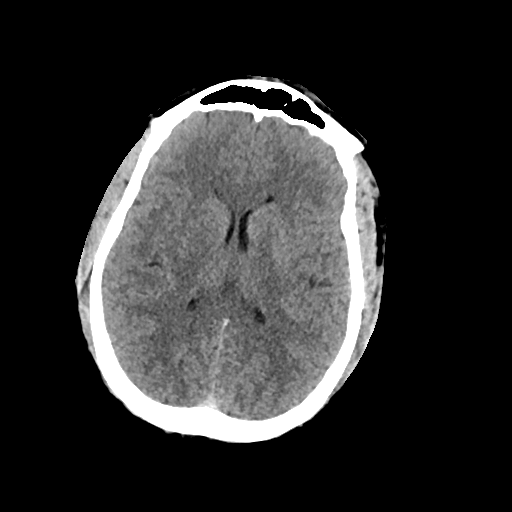
[im 23/37  brain]
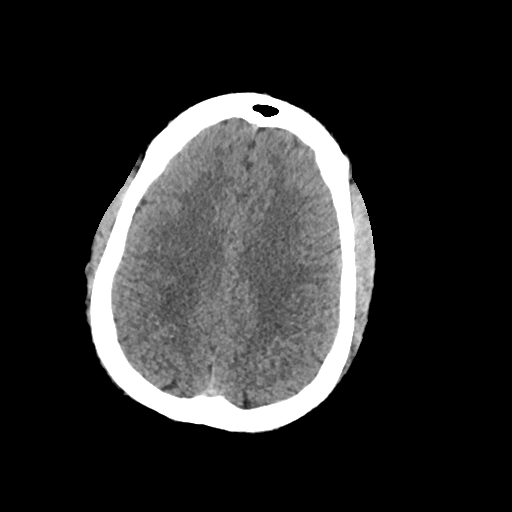
[im 23/37  bone]
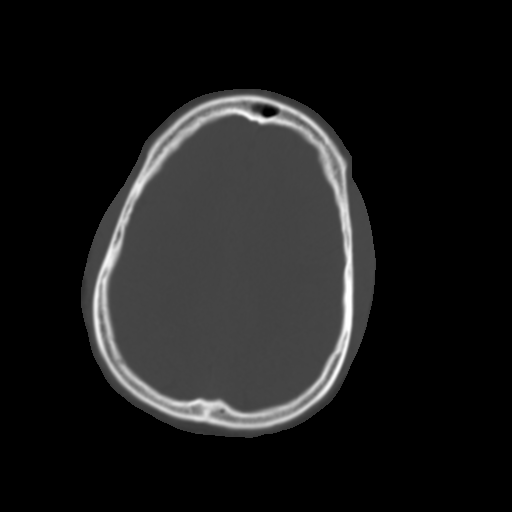
[im 28/37  brain]
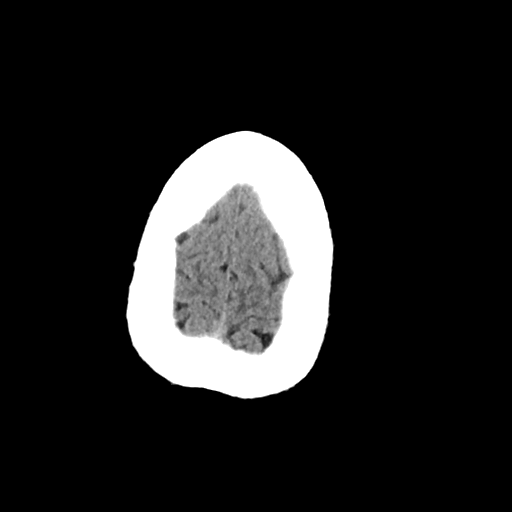
[im 32/37  brain]
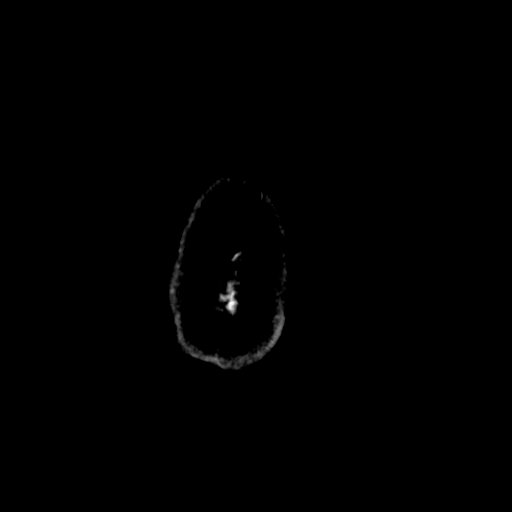

[Series 4: head bone · axial · 0.50mm/px · z∈[-168,-150]mm · 2 of 92 slices shown]
[im 10/92  bone]
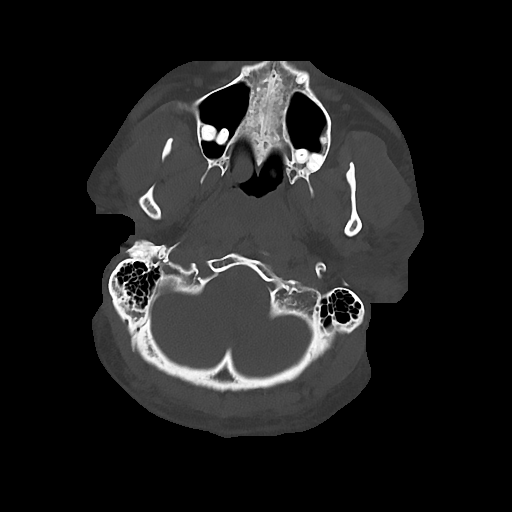
[im 19/92  bone]
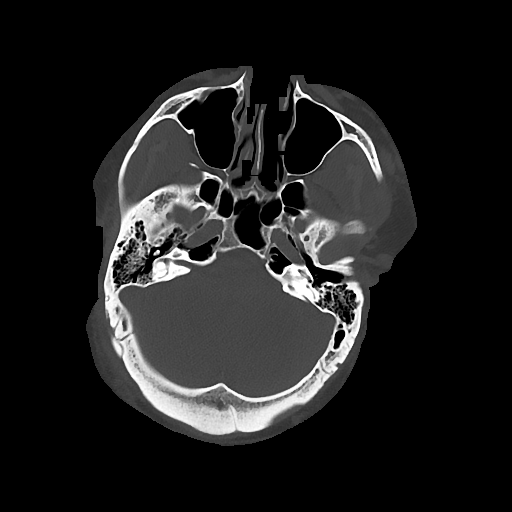

[Series 5: cor soft · coronal · 0.33mm/px · 3 of 79 slices shown]
[im 27/79  brain]
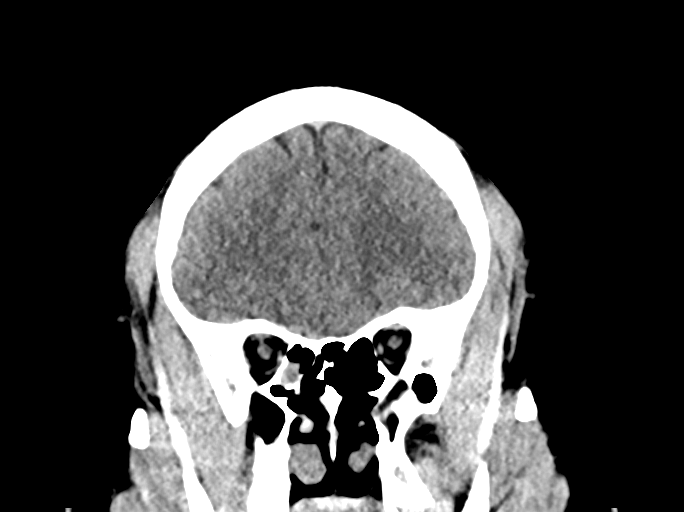
[im 35/79  brain]
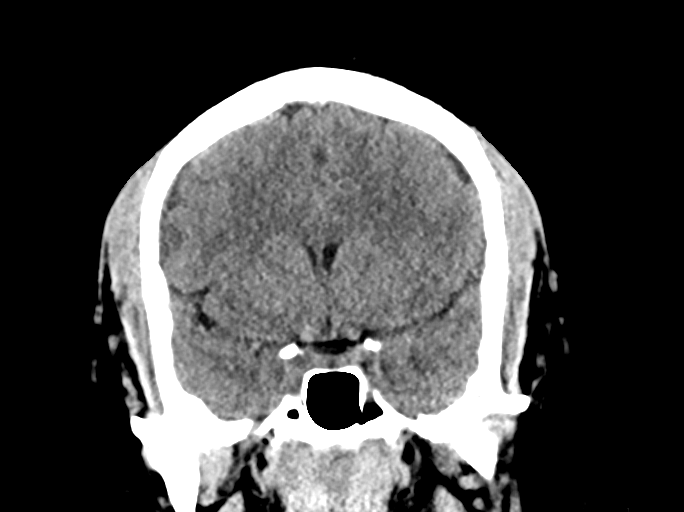
[im 44/79  brain]
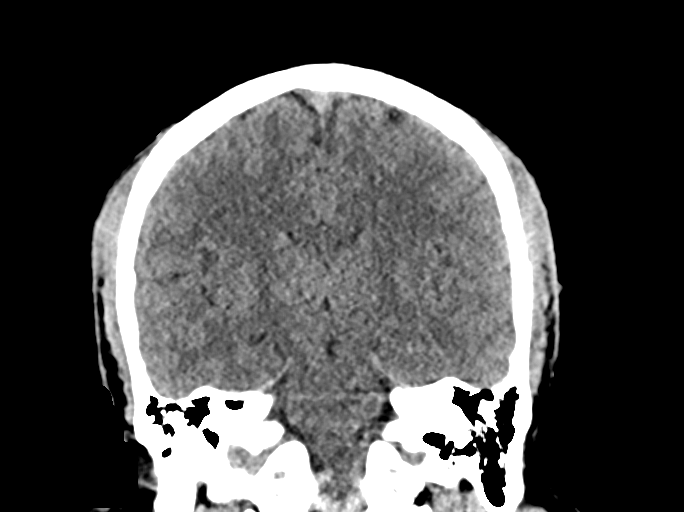

[Series 6: sag soft · sagittal · 0.36mm/px · 3 of 67 slices shown]
[im 23/67  brain]
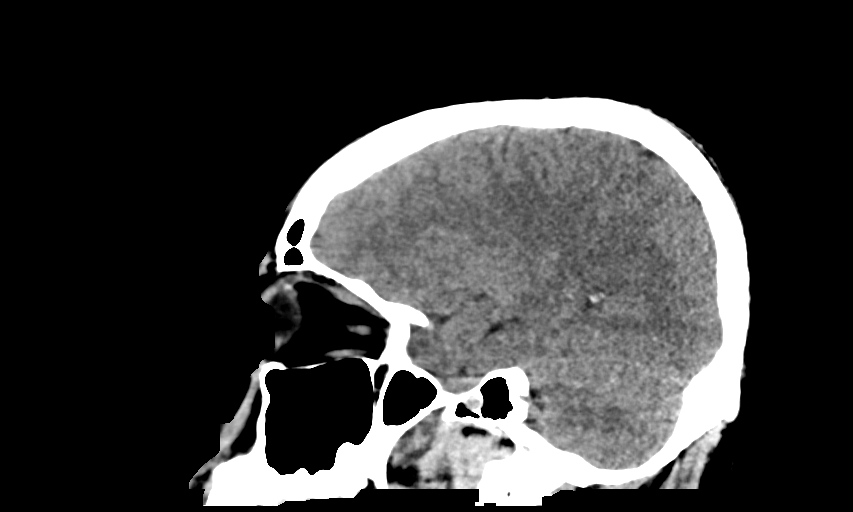
[im 34/67  brain]
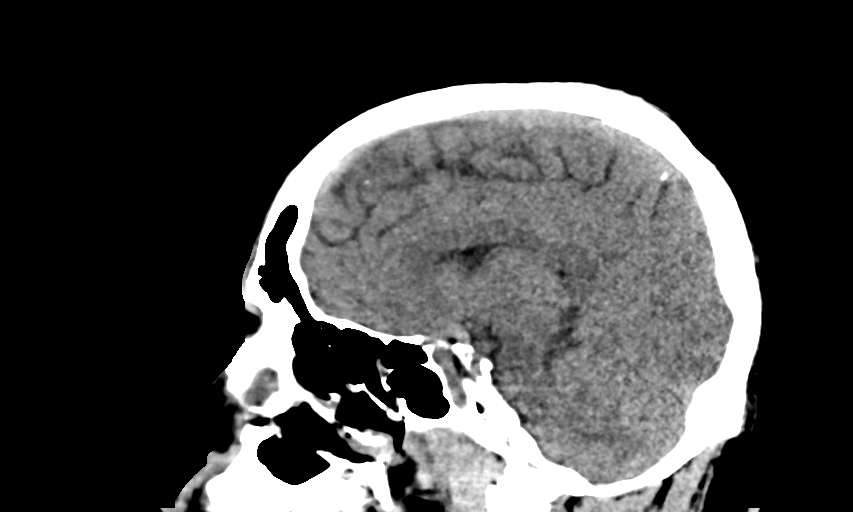
[im 45/67  brain]
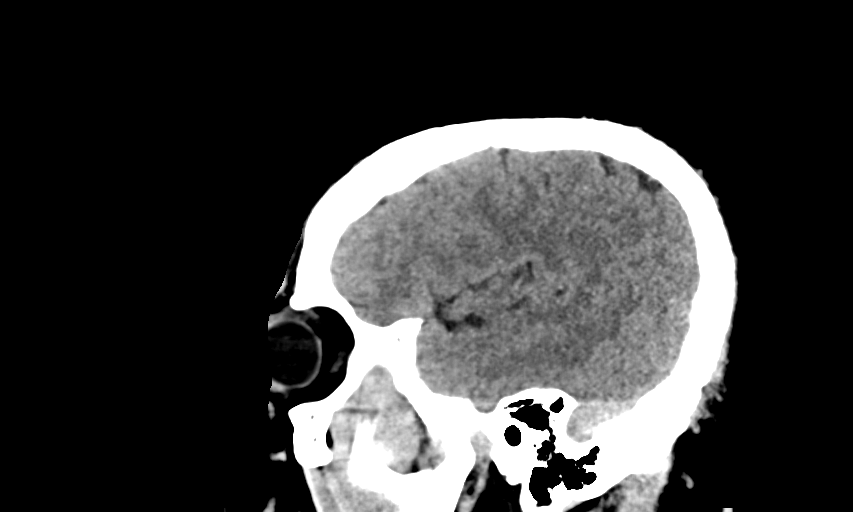

[15 of 47 positions shown; findings below may reference images not displayed]

FINDINGS: Brain: The brain shows a normal appearance without evidence of
malformation, atrophy, old or acute small or large vessel
infarction, mass lesion, hemorrhage, hydrocephalus or extra-axial
collection.

Vascular: No hyperdense vessel. No evidence of atherosclerotic
calcification.

Skull: Normal.  No traumatic finding.  No focal bone lesion.

Sinuses/Orbits: Sinuses are clear. Orbits appear normal. Mastoids
are clear.

Other: None significant
IMPRESSION: Normal head CT

## 2018-09-12 ENCOUNTER — Telehealth: Payer: Self-pay | Admitting: Internal Medicine

## 2018-09-12 NOTE — Telephone Encounter (Signed)
Dr. Laural Benes do you have paperwork from pt

## 2018-09-12 NOTE — Telephone Encounter (Signed)
Patient called to check on the status of his paperwork from his visit on the 17th. Please follow up.

## 2018-09-25 ENCOUNTER — Other Ambulatory Visit: Payer: Self-pay | Admitting: Pharmacist

## 2018-09-25 MED ORDER — HYDROCHLOROTHIAZIDE 25 MG PO TABS: 25.0000 mg | ORAL_TABLET | Freq: Every day | ORAL | 2 refills | Status: DC

## 2018-09-25 MED ORDER — LOSARTAN POTASSIUM 100 MG PO TABS: 100.0000 mg | ORAL_TABLET | Freq: Every day | ORAL | 2 refills | Status: DC

## 2018-09-25 MED FILL — HYDROCHLOROTHIAZIDE 25 MG T: 25 | 30 days supply | Qty: 30 | Fill #0

## 2018-09-25 MED FILL — AMLODIPINE BESYLATE 10 MG T: 10 | 30 days supply | Qty: 30 | Fill #3

## 2018-09-25 MED FILL — LOSARTAN POTASSIUM 100 MG T: 100 | 30 days supply | Qty: 30 | Fill #0

## 2018-09-25 MED FILL — FUROSEMIDE 20 MG TABLET: 20 | 30 days supply | Qty: 30 | Fill #3

## 2018-09-25 MED FILL — ATORVASTATIN CALCIUM 40 MG: 40 | 30 days supply | Qty: 30 | Fill #1

## 2018-09-25 MED FILL — FARXIGA 5 MG TABLET: 5 | 30 days supply | Qty: 30 | Fill #1

## 2018-09-26 ENCOUNTER — Telehealth: Payer: Self-pay | Admitting: Internal Medicine

## 2018-09-26 NOTE — Telephone Encounter (Signed)
Pt called to notify Nurse that he is no longer taking -gabapentin (NEURONTIN) 300 MG capsule  And he recently had a physical at another location but does not recall the locations name, please follow up with the paperwork they faxed over for Dr.Johnson to complete.

## 2018-09-26 NOTE — Telephone Encounter (Signed)
Will forward to pcp

## 2018-09-27 ENCOUNTER — Encounter: Payer: Self-pay | Admitting: Internal Medicine

## 2018-09-27 NOTE — Telephone Encounter (Signed)
Refill request

## 2018-10-05 ENCOUNTER — Encounter (HOSPITAL_COMMUNITY): Payer: Self-pay | Admitting: Emergency Medicine

## 2018-10-05 ENCOUNTER — Other Ambulatory Visit: Payer: Self-pay

## 2018-10-05 ENCOUNTER — Emergency Department (HOSPITAL_COMMUNITY)
Admission: EM | Admit: 2018-10-05 | Discharge: 2018-10-05 | Disposition: A | Discharge status: Home / Self Care | Payer: Medicaid Other | Attending: Emergency Medicine | Admitting: Emergency Medicine

## 2018-10-05 ENCOUNTER — Emergency Department (HOSPITAL_COMMUNITY): Payer: Medicaid Other

## 2018-10-05 DIAGNOSIS — E119 Type 2 diabetes mellitus without complications: Secondary | ICD-10-CM | POA: Diagnosis not present

## 2018-10-05 DIAGNOSIS — Z79899 Other long term (current) drug therapy: Secondary | ICD-10-CM | POA: Insufficient documentation

## 2018-10-05 DIAGNOSIS — I110 Hypertensive heart disease with heart failure: Secondary | ICD-10-CM | POA: Insufficient documentation

## 2018-10-05 DIAGNOSIS — Z794 Long term (current) use of insulin: Secondary | ICD-10-CM | POA: Insufficient documentation

## 2018-10-05 DIAGNOSIS — Z7982 Long term (current) use of aspirin: Secondary | ICD-10-CM | POA: Insufficient documentation

## 2018-10-05 DIAGNOSIS — R1011 Right upper quadrant pain: Secondary | ICD-10-CM | POA: Diagnosis present

## 2018-10-05 DIAGNOSIS — K55069 Acute infarction of intestine, part and extent unspecified: Secondary | ICD-10-CM | POA: Insufficient documentation

## 2018-10-05 DIAGNOSIS — I5040 Unspecified combined systolic (congestive) and diastolic (congestive) heart failure: Secondary | ICD-10-CM | POA: Diagnosis not present

## 2018-10-05 LAB — COMPREHENSIVE METABOLIC PANEL
ALBUMIN: 4.5 g/dL (ref 3.5–5.0)
ALK PHOS: 71 U/L (ref 38–126)
ALT: 18 U/L (ref 0–44)
ANION GAP: 11 (ref 5–15)
AST: 17 U/L (ref 15–41)
BILIRUBIN TOTAL: 1.2 mg/dL (ref 0.3–1.2)
BUN: 18 mg/dL (ref 6–20)
CALCIUM: 9.3 mg/dL (ref 8.9–10.3)
CO2: 27 mmol/L (ref 22–32)
CREATININE: 1.63 mg/dL — AB (ref 0.61–1.24)
Chloride: 100 mmol/L (ref 98–111)
GFR, EST NON AFRICAN AMERICAN: 55 mL/min — AB (ref >60)
GLUCOSE: 85 mg/dL (ref 70–99)
POTASSIUM: 3.6 mmol/L (ref 3.5–5.1)
Sodium: 138 mmol/L (ref 135–145)
Total Protein: 8.8 g/dL — ABNORMAL HIGH (ref 6.5–8.1)

## 2018-10-05 LAB — URINALYSIS, ROUTINE W REFLEX MICROSCOPIC
BILIRUBIN URINE: NEGATIVE
Bacteria, UA: NONE SEEN
Glucose, UA: >=500 mg/dL — AB
Hgb urine dipstick: NEGATIVE
Ketones, ur: NEGATIVE mg/dL
Leukocytes,Ua: NEGATIVE
Nitrite: NEGATIVE
Protein, ur: 30 mg/dL — AB
SPECIFIC GRAVITY, URINE: 1.018 (ref 1.005–1.030)
pH: 5 (ref 5.0–8.0)

## 2018-10-05 LAB — LIPASE, BLOOD: Lipase: 35 U/L (ref 11–51)

## 2018-10-05 LAB — CBC
HCT: 44.1 % (ref 39.0–52.0)
Hemoglobin: 14.3 g/dL (ref 13.0–17.0)
MCH: 23.8 pg — AB (ref 26.0–34.0)
MCHC: 32.4 g/dL (ref 30.0–36.0)
MCV: 73.5 fL — AB (ref 80.0–100.0)
PLATELETS: 265 10*3/uL (ref 150–400)
RBC: 6 MIL/uL — AB (ref 4.22–5.81)
RDW: 14.1 % (ref 11.5–15.5)
WBC: 10.1 10*3/uL (ref 4.0–10.5)
nRBC: 0 % (ref 0.0–0.2)

## 2018-10-05 MED ORDER — ONDANSETRON HCL 4 MG PO TABS: 4.0000 mg | ORAL_TABLET | Freq: Four times a day (QID) | ORAL | 0 refills | Status: DC

## 2018-10-05 MED ORDER — ONDANSETRON HCL 4 MG/2ML IJ SOLN
4.0000 mg | Freq: Once | INTRAMUSCULAR | Status: AC
  Administered 2018-10-05: 4 mg via INTRAVENOUS
  Filled 2018-10-05: qty 2

## 2018-10-05 MED ORDER — TRAMADOL HCL 50 MG PO TABS: 50.0000 mg | ORAL_TABLET | Freq: Four times a day (QID) | ORAL | 0 refills | Status: DC | PRN

## 2018-10-05 MED ORDER — MORPHINE SULFATE (PF) 4 MG/ML IV SOLN
4.0000 mg | Freq: Once | INTRAVENOUS | Status: AC
  Administered 2018-10-05: 4 mg via INTRAVENOUS
  Filled 2018-10-05: qty 1

## 2018-10-05 MED ORDER — IOHEXOL 300 MG/ML  SOLN
30.0000 mL | Freq: Once | INTRAMUSCULAR | Status: AC | PRN
  Administered 2018-10-05: 30 mL via ORAL

## 2018-10-05 MED ORDER — SODIUM CHLORIDE 0.9 % IV BOLUS
1000.0000 mL | Freq: Once | INTRAVENOUS | Status: AC
  Administered 2018-10-05: 1000 mL via INTRAVENOUS

## 2018-10-05 NOTE — ED Provider Notes (Signed)
[facility]-EMERGENCY DEPT Provider Note   CSN: [id number] Arrival date & time: 10/05/18  1210     History   Chief Complaint Chief Complaint  Patient presents with  . Abdominal Pain    HPI Paul Faine Lamb is a 33 y.o. male.  The history is provided by the patient and medical records. No language interpreter was used.  Abdominal Pain     33 year old morbidly obese male with history of diabetes, CHF, hypertension presenting for evaluation of abdominal pain.  Patient report right abdominal pain ongoing for the past 4 days.  Pain is constant, sometimes intensify with movement, coughing, or with activity.  Rates pain a 7 out of 10.  Report decrease in appetite, chills, difficulty having bowel movement.  Pain at times keeping up at night.  No associated fever, chest pain, shortness of breath, productive cough, URI symptoms, dysuria nausea or vomiting.  He tries taking Tylenol with some improvement.  Patient was concerned of appendicitis.  Denies any prior abdominal surgery.  Past Medical History:  Diagnosis Date  . Acute combined systolic and diastolic CHF, NYHA class 3 (HCC) 11/18/2016   EF now 40-45% by echo  . Acute diastolic (congestive) heart failure (HCC) 01/12/2016  . Diabetes mellitus, new onset (HCC)   . Hypertension   . Morbid obesity (HCC)   . OSA (obstructive sleep apnea)     Patient Active Problem List   Diagnosis Date Noted  . Diabetic polyneuropathy associated with type 2 diabetes mellitus (HCC) 02/23/2018  . Diabetes mellitus type 2 in obese (HCC) 12/29/2017  . Diastolic congestive heart failure (HCC) 11/28/2017  . Proteinuria   . Erectile dysfunction 05/19/2017  . Depression 05/19/2017  . Essential hypertension 01/12/2016  . Sleep apnea 01/12/2016  . Abnormal EKG   . Hand pain, left 11/01/2015  . Morbid obesity (HCC) 11/01/2015    Past Surgical History:  Procedure Laterality Date  . NO PAST SURGERIES          Home Medications      Prior to Admission medications   Medication Sig Start Date End Date Taking? Authorizing Provider  amLODipine (NORVASC) 10 MG tablet Take 1 tablet (10 mg total) by mouth daily. 02/14/18   Abelino Derrick, PA-C  aspirin 81 MG EC tablet Take 1 tablet (81 mg total) by mouth daily. 12/12/17   Marcine Matar, MD  atorvastatin (LIPITOR) 40 MG tablet Take 1 tablet (40 mg total) by mouth daily at 6 PM. 02/14/18   Kilroy, Eda Paschal, PA-C  carvedilol (COREG) 25 MG tablet Take 1 tablet (25 mg total) by mouth 2 (two) times daily with a meal. 02/14/18   Kilroy, Eda Paschal, PA-C  dapagliflozin propanediol (FARXIGA) 5 MG TABS tablet Take 5 mg by mouth daily. 06/30/18   Marcine Matar, MD  furosemide (LASIX) 20 MG tablet Take 1 tablet (20 mg total) by mouth daily. 03/07/18   Marcine Matar, MD  gabapentin (NEURONTIN) 300 MG capsule Take 1 capsule (300 mg total) by mouth 2 (two) times daily. 02/23/18   Marcine Matar, MD  glipiZIDE (GLUCOTROL) 10 MG tablet Take 1 tablet (10 mg total) by mouth 2 (two) times daily before a meal. 12/12/17   Marcine Matar, MD  hydrALAZINE (APRESOLINE) 100 MG tablet Take 1 tablet (100 mg total) by mouth 3 (three) times daily. 75 mg PO TID 06/25/18   Marcine Matar, MD  hydrochlorothiazide (HYDRODIURIL) 25 MG tablet Take 1 tablet (25 mg total) by mouth  daily. 09/25/18   Marcine MatarJohnson, Deborah B, MD  Insulin Glargine (LANTUS SOLOSTAR) 100 UNIT/ML Solostar Pen Inject 19 Units into the skin daily at 10 pm. 09/07/18   Marcine MatarJohnson, Deborah B, MD  Insulin Pen Needle 31G X 8 MM MISC Use as directed. 12/12/17   Marcine MatarJohnson, Deborah B, MD  losartan (COZAAR) 100 MG tablet Take 1 tablet (100 mg total) by mouth daily. 09/25/18   Marcine MatarJohnson, Deborah B, MD  metFORMIN (GLUCOPHAGE) 1000 MG tablet Take 1 tablet (1,000 mg total) by mouth 2 (two) times daily with a meal. 06/08/18   Marcine MatarJohnson, Deborah B, MD  VIAGRA 100 MG tablet TAKE 1 TABLET BY MOUTH 30 MINUTES BEFORE INTERCOURSE AS NEEDED. LIMIT TO ONE TABLET PER DAY  08/27/18   Marcine MatarJohnson, Deborah B, MD    Family History Family History  Problem Relation Age of Onset  . Hypertension Other   . Diabetes Mellitus II Other     Social History Social History   Tobacco Use  . Smoking status: Never Smoker  . Smokeless tobacco: Never Used  Substance Use Topics  . Alcohol use: Yes    Alcohol/week: 0.0 standard drinks  . Drug use: No     Allergies   Imdur [isosorbide dinitrate] and Penicillins   Review of Systems Review of Systems  Gastrointestinal: Positive for abdominal pain.  All other systems reviewed and are negative.    Physical Exam Updated Vital Signs BP 114/69 (BP Location: Right Arm)   Pulse 89   Temp 98.1 F (36.7 C) (Oral)   Resp 18   SpO2 97%   Physical Exam Vitals signs and nursing note reviewed.  Constitutional:      General: He is not in acute distress.    Appearance: He is well-developed. He is obese.  HENT:     Head: Atraumatic.  Eyes:     Conjunctiva/sclera: Conjunctivae normal.  Neck:     Musculoskeletal: Neck supple.  Cardiovascular:     Rate and Rhythm: Normal rate and regular rhythm.  Pulmonary:     Effort: Pulmonary effort is normal.     Breath sounds: Normal breath sounds.  Abdominal:     General: Bowel sounds are normal.     Palpations: Abdomen is soft.     Tenderness: There is abdominal tenderness in the right upper quadrant. Positive signs include Murphy's sign. Negative signs include McBurney's sign.     Hernia: No hernia is present.  Skin:    Findings: No rash.  Neurological:     Mental Status: He is alert.      ED Treatments / Results  Labs (all labs ordered are listed, but only abnormal results are displayed) Labs Reviewed  COMPREHENSIVE METABOLIC PANEL - Abnormal; Notable for the following components:      Result Value   Creatinine, Ser 1.63 (*)    Total Protein 8.8 (*)    GFR calc non Af Amer 55 (*)    All other components within normal limits  CBC - Abnormal; Notable for the  following components:   RBC 6.00 (*)    MCV 73.5 (*)    MCH 23.8 (*)    All other components within normal limits  URINALYSIS, ROUTINE W REFLEX MICROSCOPIC - Abnormal; Notable for the following components:   Glucose, UA >=500 (*)    Protein, ur 30 (*)    All other components within normal limits  LIPASE, BLOOD    EKG None  Radiology Ct Abdomen Pelvis Wo Contrast  Result Date: 10/05/2018 CLINICAL  DATA:  Right lower quadrant abdominal pain with chills for 3 days. EXAM: CT ABDOMEN AND PELVIS WITHOUT CONTRAST TECHNIQUE: Multidetector CT imaging of the abdomen and pelvis was performed following the standard protocol without IV contrast. COMPARISON:  Abdominal ultrasound 10/05/2018. Abdominal CT 11/01/2015. FINDINGS: Lower chest: Clear lung bases. No significant pleural or pericardial effusion. Hepatobiliary: The liver demonstrates diffuse steatosis without focal abnormality on noncontrast imaging. The gallbladder is incompletely distended. No evidence of gallstones, gallbladder wall thickening or biliary dilatation. Pancreas: Unremarkable. No pancreatic ductal dilatation or surrounding inflammatory changes. Spleen: Normal in size without focal abnormality.  Small splenule. Adrenals/Urinary Tract: Both adrenal glands appear normal. The kidneys, ureters and bladder appear normal. No evidence of urinary tract calculus or hydronephrosis. Stomach/Bowel: No evidence of bowel wall thickening, distention or surrounding inflammatory change. The appendix appears normal. Vascular/Lymphatic: There are no enlarged abdominal or pelvic lymph nodes. No significant vascular findings on noncontrast imaging. Reproductive: The prostate gland and seminal vesicles appear normal. Other: There is a right upper quadrant inflammatory process centered along the anterior inferior aspect of the liver. There is focally inflamed fat in this area, measuring up to 5.4 cm on image 50/5. There is surrounding inflammation and a small  amount of ascites. This is separate from the hepatic flexure of the colon and most consistent with an omental infarct. Musculoskeletal: No acute or significant osseous findings. IMPRESSION: 1. Focal inflammatory process in the right upper quadrant along the inferior tip of the liver most consistent with an omental infarct. This is most commonly a self-limiting process which can be managed conservatively. 2. No evidence of appendicitis, bowel obstruction or perforation. 3. Hepatic steatosis. Electronically Signed   By: Carey Bullocks M.D.   On: 10/05/2018 19:06   US Abdomen Limited  Result Date: 10/05/2018 CLINICAL DATA:  Upper abdominal pain EXAM: ULTRASOUND ABDOMEN LIMITED RIGHT UPPER QUADRANT COMPARISON:  None. FINDINGS: Gallbladder: No gallstones or wall thickening visualized. There is no pericholecystic fluid. No sonographic Murphy sign noted by sonographer. Common bile duct: Diameter: 9 mm, prominent. Common bile duct appears to taper distally. Portions of the distal common bile duct are not visualized due to overlying gas. No intrahepatic biliary duct dilatation evident. Liver: No focal lesion identified. Liver echogenicity is increased diffusely with suspected focus of fatty sparing near the gallbladder fossa. Portal vein is patent on color Doppler imaging with normal direction of blood flow towards the liver. IMPRESSION: 1.  No demonstrable gallbladder pathology. 2. Prominence of the proximal common bile duct. No biliary duct mass or calculus is evident. Note that portions of the distal common bile duct are obscured by gas. From an imaging standpoint, MRCP would be the imaging study of choice to further evaluate common bile duct. 3. Diffuse increase in liver echogenicity, felt to represent hepatic steatosis. Apparent sparing near the gallbladder fossa region. While no focal liver lesions are evident, it must be cautioned that the sensitivity of ultrasound for detection of focal liver lesions is  diminished in this circumstance. Electronically Signed   By: Bretta Bang III M.D.   On: 10/05/2018 15:58    Procedures Procedures (including critical care time)  Medications Ordered in ED Medications  morphine 4 MG/ML injection 4 mg (4 mg Intravenous Given 10/05/18 1900)  ondansetron (ZOFRAN) injection 4 mg (4 mg Intravenous Given 10/05/18 1900)  sodium chloride 0.9 % bolus 1,000 mL (1,000 mLs Intravenous New Bag/Given (Non-Interop) 10/05/18 1753)  iohexol (OMNIPAQUE) 300 MG/ML solution 30 mL (30 mLs Oral Contrast Given 10/05/18 1845)  Initial Impression / Assessment and Plan / ED Course  I have reviewed the triage vital signs and the nursing notes.  Pertinent labs & imaging results that were available during my care of the patient were reviewed by me and considered in my medical decision making (see chart for details).     BP (!) 158/67   Pulse 95   Temp 98.1 F (36.7 C) (Oral)   Resp 18   Ht 6' (1.829 m)   Wt (!) 186.9 kg   SpO2 98%   BMI 55.88 kg/m    Final Clinical Impressions(s) / ED Diagnoses   Final diagnoses:  Omental infarction [facility])    ED Discharge Orders         Ordered    traMADol (ULTRAM) 50 MG tablet  Every 6 hours PRN     10/05/18 2016    ondansetron (ZOFRAN) 4 MG tablet  Every 6 hours     10/05/18 2016         3:18 PM Patient report R side abdominal pain and was concerned for appendicitis.  His pain is most significant in the right upper quadrant concerning for potential biliary disease.  Will obtain abdominal ultrasound.  4:04 PM Limited abd US obtained showing no demonstrable gallbladder pathology.  Common bile duct was not fully visualized from obscured by gas.  Given this non-finding on [place], and pt's moderate pain on exam, will obtain abd/pelvis CT for further care.  Pt with AKI, Cr 1.63, therefore CT will be without contrast.  IVF given. His remainder labs are otherwise reassuring.   7:19 PM CT of the abdomen and pelvis demonstrate  focal inflammatory process to the right upper quadrant along the inferior tip of the liver most consistent with an omental infarct.  This is most commonly a self-limiting process which can be managed conservatively.  No evidence of appendicitis, bowel obstruction, or perforation.  Evidence of hepatic steatosis.  The finding of the CT is suggestive of the source of patient's current pain.   Fayrene Helper, PA-C 10/05/18 2018    Arby Barrette, MD 10/06/18 0030

## 2018-10-05 NOTE — ED Triage Notes (Signed)
Pt c.o abd pains and constipation for couple days. Denies n/v or urinary problems.

## 2018-10-05 NOTE — ED Notes (Signed)
SUSAN CHARGE RN AT BEDSIDE ATTEMPTING IV WITH BLOOD DRAW

## 2018-10-05 NOTE — ED Notes (Signed)
Pt and visitor verbalized discharge instructions and follow up care. Alert and ambulatory

## 2018-10-05 NOTE — ED Notes (Signed)
ATTEMPT X 1 WITH [place] IV WITHOUT SUCCESS. REQUESTED SUSAN RN TO ASSIST. SHE AGREED. PT VERBALIZED UNDERSTANDING

## 2018-10-05 NOTE — Discharge Instructions (Signed)
You have been diagnosed with omental infarction, a self-limiting condition less severe than acute mesenteric ischemia.  In essence, a portion of your stomach fatty content did not receive adequate blood flow and causing pain.  Take pain medication and antinausea medication as needed.  Your condition will resolve over time without long term consequences.  Return if you have any concerns.

## 2018-10-05 NOTE — ED Notes (Signed)
Patient transported to CT 

## 2018-10-05 NOTE — ED Notes (Signed)
US at bedside

## 2018-11-09 ENCOUNTER — Encounter: Payer: Self-pay | Admitting: Internal Medicine

## 2018-11-09 ENCOUNTER — Ambulatory Visit: Payer: Medicaid Other | Attending: Internal Medicine | Admitting: Internal Medicine

## 2018-11-09 ENCOUNTER — Other Ambulatory Visit: Payer: Self-pay

## 2018-11-09 VITALS — BP 136/83 | HR 92 | Temp 99.8°F | Resp 16 | Wt >= 6400 oz

## 2018-11-09 DIAGNOSIS — Z79899 Other long term (current) drug therapy: Secondary | ICD-10-CM | POA: Diagnosis not present

## 2018-11-09 DIAGNOSIS — E1169 Type 2 diabetes mellitus with other specified complication: Secondary | ICD-10-CM

## 2018-11-09 DIAGNOSIS — E1142 Type 2 diabetes mellitus with diabetic polyneuropathy: Secondary | ICD-10-CM | POA: Diagnosis not present

## 2018-11-09 DIAGNOSIS — G4733 Obstructive sleep apnea (adult) (pediatric): Secondary | ICD-10-CM | POA: Insufficient documentation

## 2018-11-09 DIAGNOSIS — I10 Essential (primary) hypertension: Secondary | ICD-10-CM

## 2018-11-09 DIAGNOSIS — Z794 Long term (current) use of insulin: Secondary | ICD-10-CM | POA: Diagnosis not present

## 2018-11-09 DIAGNOSIS — N529 Male erectile dysfunction, unspecified: Secondary | ICD-10-CM | POA: Insufficient documentation

## 2018-11-09 DIAGNOSIS — I110 Hypertensive heart disease with heart failure: Secondary | ICD-10-CM | POA: Diagnosis not present

## 2018-11-09 DIAGNOSIS — N289 Disorder of kidney and ureter, unspecified: Secondary | ICD-10-CM | POA: Diagnosis not present

## 2018-11-09 DIAGNOSIS — Z882 Allergy status to sulfonamides status: Secondary | ICD-10-CM | POA: Diagnosis not present

## 2018-11-09 DIAGNOSIS — Z6841 Body Mass Index (BMI) 40.0 and over, adult: Secondary | ICD-10-CM | POA: Diagnosis not present

## 2018-11-09 DIAGNOSIS — Z7982 Long term (current) use of aspirin: Secondary | ICD-10-CM | POA: Insufficient documentation

## 2018-11-09 DIAGNOSIS — Z880 Allergy status to penicillin: Secondary | ICD-10-CM | POA: Diagnosis not present

## 2018-11-09 DIAGNOSIS — Z8249 Family history of ischemic heart disease and other diseases of the circulatory system: Secondary | ICD-10-CM | POA: Insufficient documentation

## 2018-11-09 DIAGNOSIS — Z833 Family history of diabetes mellitus: Secondary | ICD-10-CM | POA: Diagnosis not present

## 2018-11-09 DIAGNOSIS — E669 Obesity, unspecified: Secondary | ICD-10-CM | POA: Diagnosis not present

## 2018-11-09 DIAGNOSIS — I5030 Unspecified diastolic (congestive) heart failure: Secondary | ICD-10-CM | POA: Diagnosis not present

## 2018-11-09 LAB — GLUCOSE, POCT (MANUAL RESULT ENTRY): POC GLUCOSE: 88 mg/dL (ref 70–99)

## 2018-11-09 NOTE — Progress Notes (Signed)
Patient ID: Paul Lamb, male    DOB: 10/09/1985  MRN: [id number]  CC: Diabetes and Hypertension   Subjective: Paul Lamb is a 33 y.o. male who presents for chronic ds management. His concerns today include:  Patient with history ofHTN,diastolic CHF with EF of50-55%, HTN, OSA on CPAP, HL, morbid obesity, diabetes, depression.  DM: Since last visit with me 2 months ago.  Patient discontinued the Lantus 1 month ago.  He reports that 1 day he checked his blood sugar and it was around 100 and he felt it was getting too low so he decided to stay off of Lantus.  Been taking just his oral medications which are the glipizide, metformin and Farxiga.  He brings in blood sugar logs that starts from December 23 of last year.  He is checking his blood sugar 2-3 times a day.  Over the past month blood sugar range has been 93-140.   Eating 2-3 x a day with dinner being very light. 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  If I am agreeable to him being off of insulin based on his blood sugar log that he has with him today, then he will need a letter stating that he is no longer on insulin, his A1c and that he is also no longer taking gabapentin.  Patient was seen in the ER last month with abdominal pain and was given a limited supply of tramadol and Zofran.  He is no longer taking those.  He has requested that the letter also states that he is no longer taking those medications and that they be removed from his med list.  HTN:  Taking BP meds. Has BP dev but has not had with him recently  Limiting salt.   No CP/SOB Patient Active Problem List   Diagnosis Date Noted  . Diabetic polyneuropathy associated with type 2 diabetes mellitus (HCC) 02/23/2018  . Diabetes mellitus type 2 in obese (HCC) 12/29/2017  . Diastolic congestive heart failure (HCC) 11/28/2017  . Proteinuria   . Erectile  dysfunction 05/19/2017  . Depression 05/19/2017  . Essential hypertension 01/12/2016  . Sleep apnea 01/12/2016  . Abnormal EKG   . Hand pain, left 11/01/2015  . Morbid obesity (HCC) 11/01/2015     Current Outpatient Medications on File Prior to Visit  Medication Sig Dispense Refill  . amLODipine (NORVASC) 10 MG tablet Take 1 tablet (10 mg total) by mouth daily. 30 tablet 6  . aspirin 81 MG EC tablet Take 1 tablet (81 mg total) by mouth daily. 100 tablet 1  . atorvastatin (LIPITOR) 40 MG tablet Take 1 tablet (40 mg total) by mouth daily at 6 PM. 30 tablet 6  . carvedilol (COREG) 25 MG tablet Take 1 tablet (25 mg total) by mouth 2 (two) times daily with a meal. 60 tablet 6  . dapagliflozin propanediol (FARXIGA) 5 MG TABS tablet Take 5 mg by mouth daily. 30 tablet 4  . furosemide (LASIX) 20 MG tablet Take 1 tablet (20 mg total) by mouth daily. 30 tablet 3  . glipiZIDE (GLUCOTROL) 10 MG tablet Take 1 tablet (10 mg total) by mouth 2 (two) times daily before a meal. 60 tablet 6  . hydrALAZINE (APRESOLINE) 100 MG tablet Take 1 tablet (100 mg total) by mouth 3 (three) times daily. 75 mg PO TID (Patient not taking: Reported on 10/05/2018) 90 tablet 6  .  hydrALAZINE (APRESOLINE) 50 MG tablet Take 50 mg by mouth 2 (two) times daily.     . hydrochlorothiazide (HYDRODIURIL) 25 MG tablet Take 1 tablet (25 mg total) by mouth daily. 30 tablet 2  . Insulin Pen Needle 31G X 8 MM MISC Use as directed. 100 each 4  . losartan (COZAAR) 100 MG tablet Take 1 tablet (100 mg total) by mouth daily. 30 tablet 2  . metFORMIN (GLUCOPHAGE) 1000 MG tablet Take 1 tablet (1,000 mg total) by mouth 2 (two) times daily with a meal. 60 tablet 5  . VIAGRA 100 MG tablet TAKE 1 TABLET BY MOUTH 30 MINUTES BEFORE INTERCOURSE AS NEEDED. LIMIT TO ONE TABLET PER DAY (Patient taking differently: Take 100 mg by mouth daily as needed for erectile dysfunction. ) 30 tablet 3   No current facility-administered medications on file prior to  visit.     Allergies  Allergen Reactions  . Imdur [Isosorbide Dinitrate] Other (See Comments)    Headaches   . Penicillins Other (See Comments)    From childhood; reaction not known: Has patient had a PCN reaction causing immediate rash, facial/tongue/throat swelling, SOB or lightheadedness with hypotension: Unk Has patient had a PCN reaction causing severe rash involving mucus membranes or skin necrosis: Unk Has patient had a PCN reaction that required hospitalization: Unk Has patient had a PCN reaction occurring within the last 10 years: No If all of the above answers are "NO", then may proceed with Cephalosporin use.     Social History   Socioeconomic History  . Marital status: Single    Spouse name: Not on file  . Number of children: Not on file  . Years of education: Not on file  . Highest education level: Not on file  Occupational History  . Not on file  Social Needs  . Financial resource strain: Not on file  . Food insecurity:    Worry: Not on file    Inability: Not on file  . Transportation needs:    Medical: Not on file    Non-medical: Not on file  Tobacco Use  . Smoking status: Never Smoker  . Smokeless tobacco: Never Used  Substance and Sexual Activity  . Alcohol use: Yes    Alcohol/week: 0.0 standard drinks  . Drug use: No  . Sexual activity: Yes  Lifestyle  . Physical activity:    Days per week: 0 days    Minutes per session: 0 min  . Stress: To some extent  Relationships  . Social connections:    Talks on phone: Patient refused    Gets together: Patient refused    Attends religious service: Patient refused    Active member of club or organization: Patient refused    Attends meetings of clubs or organizations: Patient refused    Relationship status: Patient refused  . Intimate partner violence:    Fear of current or ex partner: No    Emotionally abused: No    Physically abused: No    Forced sexual activity: No  Other Topics Concern  . Not on  file  Social History Narrative  . Not on file    Family History  Problem Relation Age of Onset  . Hypertension Other   . Diabetes Mellitus II Other     Past Surgical History:  Procedure Laterality Date  . NO PAST SURGERIES      ROS: Review of Systems Negative except as stated above  PHYSICAL EXAM: BP 136/83   Pulse 92  Temp 99.8 F (37.7 C) (Oral)   Resp 16   Wt (!) 413 lb 9.6 oz (187.6 kg)   SpO2 95%   BMI 56.09 kg/m   Physical Exam General appearance - alert, well appearing, and in no distress Mental status - normal mood, behavior, speech, dress, motor activity, and thought processes Chest - clear to auscultation, no wheezes, rales or rhonchi, symmetric air entry Heart - normal rate, regular rhythm, normal S1, S2, no murmurs, rubs, clicks or gallops Extremities - peripheral pulses normal, no pedal edema, no clubbing or cyanosis  CMP Latest Ref Rng & Units 10/05/2018 06/22/2018 06/08/2018  Glucose 70 - 99 mg/dL 85 330(Q) 762(U)  BUN 6 - 20 mg/dL 18 14 19   Creatinine 0.61 - 1.24 mg/dL 6.33(H) 5.45 6.25  Sodium 135 - 145 mmol/L 138 136 139  Potassium 3.5 - 5.1 mmol/L 3.6 3.6 4.4  Chloride 98 - 111 mmol/L 100 101 97  CO2 22 - 32 mmol/L 27 22 20   Calcium 8.9 - 10.3 mg/dL 9.3 9.3 63.8  Total Protein 6.5 - 8.1 g/dL 9.3(T) - -  Total Bilirubin 0.3 - 1.2 mg/dL 1.2 - -  Alkaline Phos 38 - 126 U/L 71 - -  AST 15 - 41 U/L 17 - -  ALT 0 - 44 U/L 18 - -   Lipid Panel     Component Value Date/Time   CHOL 295 (H) 09/10/2017 0451   TRIG 145 09/10/2017 0451   HDL 39 (L) 09/10/2017 0451   CHOLHDL 7.6 09/10/2017 0451   VLDL 29 09/10/2017 0451   LDLCALC 227 (H) 09/10/2017 0451    CBC    Component Value Date/Time   WBC 10.1 10/05/2018 1400   RBC 6.00 (H) 10/05/2018 1400   HGB 14.3 10/05/2018 1400   HGB 14.8 05/19/2017 1344   HCT 44.1 10/05/2018 1400   HCT 43.8 05/19/2017 1344   PLT 265 10/05/2018 1400   PLT 233 05/19/2017 1344   MCV 73.5 (L) 10/05/2018 1400   MCV  72 (L) 05/19/2017 1344   MCH 23.8 (L) 10/05/2018 1400   MCHC 32.4 10/05/2018 1400   RDW 14.1 10/05/2018 1400   RDW 16.3 (H) 05/19/2017 1344   LYMPHSABS 1.9 11/01/2015 1043   MONOABS 0.6 11/01/2015 1043   EOSABS 0.1 11/01/2015 1043   BASOSABS 0.1 11/01/2015 1043    ASSESSMENT AND PLAN: 1. Diabetes mellitus type 2 in obese (HCC) Patient's blood sugar log shows readings that are very good.  He self stopped Lantus 1 month ago.  Blood sugars look good without it.  I told him that he can remain off of it.  He will continue his oral diabetes medications.  We will plan to recheck his A1c the middle of next month at which time we will give him his letter. Encouraged him to continue healthy eating habits - POCT glucose (manual entry)  2. Essential hypertension Close to goal.  Continue medications  3. Renal insufficiency Creatinine elevated on recent ER visit when compared to previous readings.  I would like to recheck this today. - Basic Metabolic Panel     Patient was given the opportunity to ask questions.  Patient verbalized understanding of the plan and was able to repeat key elements of the plan.   Orders Placed This Encounter  Procedures  . Basic Metabolic Panel  . POCT glucose (manual entry)     Requested Prescriptions    No prescriptions requested or ordered in this encounter    No follow-ups on  file.  [name], MD, FACP

## 2018-11-10 LAB — BASIC METABOLIC PANEL
BUN/Creatinine Ratio: 9 (ref 9–20)
BUN: 12 mg/dL (ref 6–20)
CO2: 15 mmol/L — ABNORMAL LOW (ref 20–29)
Calcium: 10.4 mg/dL — ABNORMAL HIGH (ref 8.7–10.2)
Chloride: 105 mmol/L (ref 96–106)
Creatinine, Ser: 1.27 mg/dL (ref 0.76–1.27)
GFR calc Af Amer: 86 mL/min/{1.73_m2} (ref >59)
GFR, EST NON AFRICAN AMERICAN: 75 mL/min/{1.73_m2} (ref >59)
Glucose: 94 mg/dL (ref 65–99)
Potassium: 5.2 mmol/L (ref 3.5–5.2)
Sodium: 145 mmol/L — ABNORMAL HIGH (ref 134–144)

## 2018-11-14 ENCOUNTER — Other Ambulatory Visit: Payer: Self-pay | Admitting: Internal Medicine

## 2018-11-14 MED FILL — glipiZIDE 10 MG TABS: 10 | 30 days supply | Qty: 60 | Fill #3

## 2018-11-14 MED FILL — CARVEDILOL 25 MG TABLET: 25 | 30 days supply | Qty: 60 | Fill #3

## 2018-11-14 MED FILL — FARXIGA 5 MG TABLET: 5 | 30 days supply | Qty: 30 | Fill #2

## 2018-11-14 MED FILL — ATORVASTATIN CALCIUM 40 MG: 40 | 30 days supply | Qty: 30 | Fill #2

## 2018-11-14 MED FILL — hydrALAZINE HCL 50 MG TABS: 50 | 30 days supply | Qty: 135 | Fill #2

## 2018-11-14 MED FILL — LOSARTAN POTASSIUM 100 MG T: 100 | 30 days supply | Qty: 30 | Fill #1

## 2018-11-14 MED FILL — metFORMIN HCL 1000 MG TABS: 1000 | 30 days supply | Qty: 60 | Fill #2

## 2018-11-14 MED FILL — AMLODIPINE BESYLATE 10 MG T: 10 | 30 days supply | Qty: 30 | Fill #4

## 2018-11-22 MED FILL — HYDROCHLOROTHIAZIDE 25 MG T: 25 | 30 days supply | Qty: 30 | Fill #1

## 2018-12-07 ENCOUNTER — Telehealth: Payer: Self-pay

## 2018-12-07 ENCOUNTER — Other Ambulatory Visit: Payer: Self-pay

## 2018-12-07 ENCOUNTER — Ambulatory Visit: Payer: Medicaid Other | Attending: Family Medicine

## 2018-12-07 DIAGNOSIS — E669 Obesity, unspecified: Secondary | ICD-10-CM | POA: Diagnosis not present

## 2018-12-07 DIAGNOSIS — E1169 Type 2 diabetes mellitus with other specified complication: Secondary | ICD-10-CM | POA: Diagnosis not present

## 2018-12-07 DIAGNOSIS — I10 Essential (primary) hypertension: Secondary | ICD-10-CM

## 2018-12-07 LAB — POCT GLYCOSYLATED HEMOGLOBIN (HGB A1C): HbA1c, POC (controlled diabetic range): 7.5 % — AB (ref 0.0–7.0)

## 2018-12-07 NOTE — Telephone Encounter (Signed)
Per Dr. 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  If I am agreeable to him being off of insulin based on his blood sugar log that he has with him today, then he will need a letter stating that he is no longer on insulin, his A1c and that he is also no longer taking gabapentin.  Patient was seen in the ER last month with abdominal pain and was given a limited supply of tramadol and Zofran.  He is no longer taking those.  He has requested that the letter also states that he is no longer taking those medications and that they be removed from his med list."  Pt came into the office today to get blood work. I got an A1C while pt was in the office.  A1C is now 7.5

## 2018-12-08 LAB — LIPID PANEL
Chol/HDL Ratio: 4.6 ratio (ref 0.0–5.0)
Cholesterol, Total: 137 mg/dL (ref 100–199)
HDL: 30 mg/dL — ABNORMAL LOW (ref >39)
LDL Calculated: 74 mg/dL (ref 0–99)
Triglycerides: 167 mg/dL — ABNORMAL HIGH (ref 0–149)
VLDL Cholesterol Cal: 33 mg/dL (ref 5–40)

## 2018-12-10 NOTE — Telephone Encounter (Signed)
Contacted pt and made aware that letter is ready for pickup 

## 2018-12-24 MED FILL — AMLODIPINE BESYLATE 10 MG T: 10 | 60 days supply | Qty: 60 | Fill #5

## 2018-12-24 MED FILL — FUROSEMIDE 20 MG TABS: 20 | 90 days supply | Qty: 90 | Fill #0

## 2018-12-24 MED FILL — HYDROCHLOROTHIAZIDE 25 MG T: 25 | 30 days supply | Qty: 30 | Fill #2

## 2018-12-24 MED FILL — LOSARTAN POTASSIUM 100 MG T: 100 | 30 days supply | Qty: 30 | Fill #2

## 2018-12-24 MED FILL — FARXIGA 5 MG TABLET: 5 | 60 days supply | Qty: 60 | Fill #3

## 2018-12-24 MED FILL — ATORVASTATIN CALCIUM 40 MG: 40 | 90 days supply | Qty: 90 | Fill #3

## 2019-01-24 ENCOUNTER — Other Ambulatory Visit: Payer: Self-pay | Admitting: Internal Medicine

## 2019-01-24 MED FILL — hydrALAZINE HCL 50 MG TABS: 50 | 30 days supply | Qty: 135 | Fill #3

## 2019-01-24 MED FILL — CARVEDILOL 25 MG TABLET: 25 | 30 days supply | Qty: 60 | Fill #4

## 2019-01-24 MED FILL — glipiZIDE 10 MG TABS: 10 | 30 days supply | Qty: 60 | Fill #0

## 2019-01-24 MED FILL — metFORMIN HCL 1000 MG TABS: 1000 | 30 days supply | Qty: 60 | Fill #3

## 2019-02-08 ENCOUNTER — Encounter: Payer: Self-pay | Admitting: Cardiovascular Disease

## 2019-02-08 ENCOUNTER — Telehealth: Payer: Self-pay

## 2019-02-08 ENCOUNTER — Telehealth (INDEPENDENT_AMBULATORY_CARE_PROVIDER_SITE_OTHER): Payer: Medicaid Other | Admitting: Cardiovascular Disease

## 2019-02-08 DIAGNOSIS — I110 Hypertensive heart disease with heart failure: Secondary | ICD-10-CM

## 2019-02-08 DIAGNOSIS — I5032 Chronic diastolic (congestive) heart failure: Secondary | ICD-10-CM | POA: Diagnosis not present

## 2019-02-08 DIAGNOSIS — E782 Mixed hyperlipidemia: Secondary | ICD-10-CM

## 2019-02-08 DIAGNOSIS — I10 Essential (primary) hypertension: Secondary | ICD-10-CM

## 2019-02-08 DIAGNOSIS — G4733 Obstructive sleep apnea (adult) (pediatric): Secondary | ICD-10-CM

## 2019-02-08 NOTE — Patient Instructions (Addendum)
Medication Instructions:  Your physician recommends that you continue on your current medications as directed. Please refer to the Current Medication list given to you today.  If you need a refill on your cardiac medications before your next appointment, please call your pharmacy.   Lab work: NONE If you have labs (blood work) drawn today and your tests are completely normal, you will receive your results only by: Marland Kitchen MyChart Message (if you have MyChart) OR . A paper copy in the mail If you have any lab test that is abnormal or we need to change your treatment, we will call you to review the results.  Testing/Procedures: NONE  Follow-Up: At [facility], you and your health needs are our priority.  As part of our continuing mission to provide you with exceptional heart care, we have created designated Provider Care Teams.  These Care Teams include your primary Cardiologist (physician) and Advanced Practice Providers (APPs -  Physician Assistants and Nurse Practitioners) who all work together to provide you with the care you need, when you need it. You will need a follow up appointment in 6 months with Kerin Ransom, PA-C and in 12 months with Dr. Gwenlyn Found.  Please call our office 2 months in advance to schedule each appointment.

## 2019-02-08 NOTE — Telephone Encounter (Signed)
Patient and/or DPR-approved person aware of 6/19 AVS instructions and verbalized understanding.  AVS TO BE RELEASED TO [facility]

## 2019-02-08 NOTE — Progress Notes (Signed)
Virtual Visit via Video Note   This visit type was conducted due to national recommendations for restrictions regarding the COVID-19 Pandemic (e.g. social distancing) in an effort to limit this patient's exposure and mitigate transmission in our community.  Due to his co-morbid illnesses, this patient is at least at moderate risk for complications without adequate follow up.  This format is felt to be most appropriate for this patient at this time.  All issues noted in this document were discussed and addressed.  A limited physical exam was performed with this format.  Please refer to the patient's chart for his consent to telehealth for [facility].   Date:  02/08/2019   ID:  Paul Lamb, DOB [birth year], MRN [id number]  Patient Location: Other:  At his place of work Provider Location: Home  PCP:  Ladell Pier, MD  Cardiologist:  Quay Burow, MD  Electrophysiologist:  None   Evaluation Performed:  Follow-Up Visit  Chief Complaint: Follow-up hypertension and diastolic heart failure  History of Present Illness:    Paul Lamb is a 33 y.o. morbidly overweight African-American male who I first met in March 2017 when he was admitted with hypertensive urgency and diastolic heart failure.  He has a history of hypertension, hyperlipidemia, obstructive sleep apnea on CPAP and diastolic heart failure.  His last 2D echo performed 12/30/2017 revealed normal LV function with severe LVH.  He has minimal peripheral edema.  He denies chest pain or but does have some dyspnea on exertion.  The patient does not have symptoms concerning for COVID-19 infection (fever, chills, cough, or new shortness of breath).    Past Medical History:  Diagnosis Date  . Acute combined systolic and diastolic CHF, NYHA class 3 ([place]) 11/18/2016   EF now 40-45% by echo  . Acute diastolic (congestive) heart failure ([place]) 01/12/2016  . Diabetes mellitus, new onset ([place])   . Hypertension   . Morbid obesity  ([place])   . OSA (obstructive sleep apnea)    Past Surgical History:  Procedure Laterality Date  . NO PAST SURGERIES       Current Meds  Medication Sig  . amLODipine (NORVASC) 10 MG tablet Take 1 tablet (10 mg total) by mouth daily.  Marland Kitchen aspirin 81 MG EC tablet Take 1 tablet (81 mg total) by mouth daily.  Marland Kitchen atorvastatin (LIPITOR) 40 MG tablet Take 1 tablet (40 mg total) by mouth daily at 6 PM.  . carvedilol (COREG) 25 MG tablet Take 1 tablet (25 mg total) by mouth 2 (two) times daily with a meal.  . dapagliflozin propanediol (FARXIGA) 5 MG TABS tablet Take 5 mg by mouth daily.  . furosemide (LASIX) 20 MG tablet TAKE 1 TABLET (20 MG TOTAL) BY MOUTH DAILY.  Marland Kitchen glipiZIDE (GLUCOTROL) 10 MG tablet TAKE 1 TABLET (10 MG TOTAL) BY MOUTH 2 (TWO) TIMES DAILY BEFORE A MEAL.  . hydrALAZINE (APRESOLINE) 100 MG tablet Take 1 tablet (100 mg total) by mouth 3 (three) times daily. 75 mg PO TID  . hydrALAZINE (APRESOLINE) 50 MG tablet Take 50 mg by mouth 2 (two) times daily.   . hydrochlorothiazide (HYDRODIURIL) 25 MG tablet Take 1 tablet (25 mg total) by mouth daily.  . Insulin Pen Needle 31G X 8 MM MISC Use as directed.  Marland Kitchen losartan (COZAAR) 100 MG tablet Take 1 tablet (100 mg total) by mouth daily.  . metFORMIN (GLUCOPHAGE) 1000 MG tablet Take 1 tablet (1,000 mg total) by mouth 2 (two) times daily with a  meal.  . VIAGRA 100 MG tablet TAKE 1 TABLET BY MOUTH 30 MINUTES BEFORE INTERCOURSE AS NEEDED. LIMIT TO ONE TABLET PER DAY (Patient taking differently: Take 100 mg by mouth daily as needed for erectile dysfunction. )     Allergies:   Imdur [isosorbide dinitrate] and Penicillins   Social History   Tobacco Use  . Smoking status: Never Smoker  . Smokeless tobacco: Never Used  Substance Use Topics  . Alcohol use: Yes    Alcohol/week: 0.0 standard drinks  . Drug use: No     Family Hx: The patient's family history includes Diabetes Mellitus II in an other family member; Hypertension in an other family  member.  ROS:   Please see the history of present illness.     All other systems reviewed and are negative.   Prior CV studies:   The following studies were reviewed today:  None  Labs/Other Tests and Data Reviewed:    EKG:  No ECG reviewed.  Recent Labs: 10/05/2018: ALT 18; Hemoglobin 14.3; Platelets 265 11/09/2018: BUN 12; Creatinine, Ser 1.27; Potassium 5.2; Sodium 145   Recent Lipid Panel Lab Results  Component Value Date/Time   CHOL 137 12/07/2018 04:13 PM   TRIG 167 (H) 12/07/2018 04:13 PM   HDL 30 (L) 12/07/2018 04:13 PM   CHOLHDL 4.6 12/07/2018 04:13 PM   CHOLHDL 7.6 09/10/2017 04:51 AM   LDLCALC 74 12/07/2018 04:13 PM    Wt Readings from Last 3 Encounters:  02/08/19 (!) 413 lb (187.3 kg)  11/09/18 (!) 413 lb 9.6 oz (187.6 kg)  10/05/18 (!) 412 lb (186.9 kg)     Objective:    Vital Signs:  Wt (!) 413 lb (187.3 kg) Comment: Previous weight  BMI 56.01 kg/m    VITAL SIGNS:  reviewed GEN:  no acute distress RESPIRATORY:  normal respiratory effort, symmetric expansion NEURO:  alert and oriented x 3, no obvious focal deficit PSYCH:  normal affect  ASSESSMENT & PLAN:    1. Diastolic heart failure- on Lasix and hydrochlorothiazide, watch his salt intake and has minimal peripheral edema 2. Essential hypertension- blood pressures usually run in 130/90 range.  He is on amlodipine, carvedilol, hydralazine, losartan and diuretics 3. Hyperlipidemia- history of hyperlipidemia on atorvastatin with lipid profile performed 12/07/2018 revealing total cholesterol 137, LDL 74 and HDL 30 4. Obstructive sleep apnea- history of obstructive sleep apnea on CPAP 5. Morbid obesity- history of morbid obesity with current weight of 413  COVID-19 Education: The signs and symptoms of COVID-19 were discussed with the patient and how to seek care for testing (follow up with PCP or arrange E-visit).  The importance of social distancing was discussed today.  Time:   Today, I have spent  8 minutes with the patient with telehealth technology discussing the above problems.     Medication Adjustments/Labs and Tests Ordered: Current medicines are reviewed at length with the patient today.  Concerns regarding medicines are outlined above.   Tests Ordered: No orders of the defined types were placed in this encounter.   Medication Changes: No orders of the defined types were placed in this encounter.   Follow Up:  In Person in 6 month(s)  Signed, Quay Burow, MD  02/08/2019 11:46 AM    [place]

## 2019-02-13 ENCOUNTER — Other Ambulatory Visit: Payer: Self-pay | Admitting: Internal Medicine

## 2019-02-14 MED FILL — FARXIGA 5 MG TABLET: 5 | 30 days supply | Qty: 30 | Fill #0

## 2019-02-14 MED FILL — LOSARTAN POTASSIUM 100 MG T: 100 | 30 days supply | Qty: 30 | Fill #0

## 2019-02-14 MED FILL — HYDROCHLOROTHIAZIDE 25 MG T: 25 | 30 days supply | Qty: 30 | Fill #0

## 2019-03-01 MED FILL — FARXIGA 5 MG TABLET: 5 | 30 days supply | Qty: 30 | Fill #0

## 2019-03-01 MED FILL — LOSARTAN POTASSIUM 100 MG T: 100 | 30 days supply | Qty: 30 | Fill #0

## 2019-03-01 MED FILL — HYDROCHLOROTHIAZIDE 25 MG T: 25 | 30 days supply | Qty: 30 | Fill #0

## 2019-03-17 ENCOUNTER — Emergency Department (HOSPITAL_COMMUNITY)
Admission: EM | Admit: 2019-03-17 | Discharge: 2019-03-18 | Disposition: A | Discharge status: Home / Self Care | Payer: Medicaid Other | Attending: Emergency Medicine | Admitting: Emergency Medicine

## 2019-03-17 ENCOUNTER — Encounter (HOSPITAL_COMMUNITY): Payer: Self-pay | Admitting: Emergency Medicine

## 2019-03-17 ENCOUNTER — Other Ambulatory Visit: Payer: Self-pay

## 2019-03-17 DIAGNOSIS — I110 Hypertensive heart disease with heart failure: Secondary | ICD-10-CM | POA: Insufficient documentation

## 2019-03-17 DIAGNOSIS — Z7984 Long term (current) use of oral hypoglycemic drugs: Secondary | ICD-10-CM | POA: Diagnosis not present

## 2019-03-17 DIAGNOSIS — Z7982 Long term (current) use of aspirin: Secondary | ICD-10-CM | POA: Insufficient documentation

## 2019-03-17 DIAGNOSIS — E1165 Type 2 diabetes mellitus with hyperglycemia: Secondary | ICD-10-CM | POA: Insufficient documentation

## 2019-03-17 DIAGNOSIS — I5041 Acute combined systolic (congestive) and diastolic (congestive) heart failure: Secondary | ICD-10-CM | POA: Insufficient documentation

## 2019-03-17 DIAGNOSIS — Z79899 Other long term (current) drug therapy: Secondary | ICD-10-CM | POA: Diagnosis not present

## 2019-03-17 LAB — BASIC METABOLIC PANEL
Anion gap: 15 (ref 5–15)
BUN: 15 mg/dL (ref 6–20)
CO2: 22 mmol/L (ref 22–32)
Calcium: 10 mg/dL (ref 8.9–10.3)
Chloride: 93 mmol/L — ABNORMAL LOW (ref 98–111)
Creatinine, Ser: 1.35 mg/dL — ABNORMAL HIGH (ref 0.61–1.24)
GFR calc Af Amer: >60 mL/min (ref >60)
GFR calc non Af Amer: >60 mL/min (ref >60)
Glucose, Bld: 476 mg/dL — ABNORMAL HIGH (ref 70–99)
Potassium: 3.8 mmol/L (ref 3.5–5.1)
Sodium: 130 mmol/L — ABNORMAL LOW (ref 135–145)

## 2019-03-17 LAB — CBC
HCT: 44.8 % (ref 39.0–52.0)
Hemoglobin: 15.6 g/dL (ref 13.0–17.0)
MCH: 24.3 pg — ABNORMAL LOW (ref 26.0–34.0)
MCHC: 34.8 g/dL (ref 30.0–36.0)
MCV: 69.7 fL — ABNORMAL LOW (ref 80.0–100.0)
Platelets: 274 10*3/uL (ref 150–400)
RBC: 6.43 MIL/uL — ABNORMAL HIGH (ref 4.22–5.81)
RDW: 13.3 % (ref 11.5–15.5)
WBC: 9.3 10*3/uL (ref 4.0–10.5)
nRBC: 0 % (ref 0.0–0.2)

## 2019-03-17 LAB — URINALYSIS, ROUTINE W REFLEX MICROSCOPIC
Bacteria, UA: NONE SEEN
Bilirubin Urine: NEGATIVE
Glucose, UA: >=500 mg/dL — AB
Hgb urine dipstick: NEGATIVE
Ketones, ur: NEGATIVE mg/dL
Leukocytes,Ua: NEGATIVE
Nitrite: NEGATIVE
Protein, ur: NEGATIVE mg/dL
Specific Gravity, Urine: 1.025 (ref 1.005–1.030)
pH: 6 (ref 5.0–8.0)

## 2019-03-17 LAB — CBG MONITORING, ED
Glucose-Capillary: 430 mg/dL — ABNORMAL HIGH (ref 70–99)
Glucose-Capillary: 323 mg/dL — ABNORMAL HIGH (ref 70–99)

## 2019-03-17 MED ORDER — SODIUM CHLORIDE 0.9 % IV BOLUS
1000.0000 mL | Freq: Once | INTRAVENOUS | Status: AC
  Administered 2019-03-17: 20:00:00 1000 mL via INTRAVENOUS

## 2019-03-17 MED ORDER — INSULIN ASPART 100 UNIT/ML ~~LOC~~ SOLN
10.0000 [IU] | Freq: Once | SUBCUTANEOUS | Status: AC
  Administered 2019-03-17: 10 [IU] via SUBCUTANEOUS

## 2019-03-17 NOTE — Discharge Instructions (Addendum)
It is important that you stay hydrated by drinking lots of water.  Maintain a low-carb and low sugar diet. Continue taking your diabetes medications as prescribed. Continue checking your blood sugars. Follow-up with your primary care provider regarding your visit today. Return to the ER for new or worsening symptoms.

## 2019-03-17 NOTE — ED Triage Notes (Signed)
Pt states today his CBG was>600. Took insulin and CBG remains>600. Reports increased urination. Denies fevers or infections.

## 2019-03-17 NOTE — ED Provider Notes (Signed)
[name] EMERGENCY DEPARTMENT Provider Note   CSN: [id number] Arrival date & time: 03/17/19  1751    History   Chief Complaint Chief Complaint  Patient presents with  . Hyperglycemia    HPI Paul Lamb is a 33 y.o. male past medical history of type 2 diabetes, hypertension, diastolic heart failure, presenting to the emergency department with complaint of hyperglycemia that began today.  Patient states he woke up this morning not feeling so well checked his blood sugar which was greater than 600.  He states he took all of his diabetic medications and then took a nap.  He states he rechecked his blood sugar and it was still over 600 so he gave himself 17 units of insulin in the afternoon around 12.  He does not currently treat his diabetes with insulin anymore, however had some leftover.  He endorses increased urinary frequency that began yesterday.  Denies any other associated symptoms or recent illnesses.      The history is provided by the patient.    Past Medical History:  Diagnosis Date  . Acute combined systolic and diastolic CHF, NYHA class 3 (HCC) 11/18/2016   EF now 40-45% by echo  . Acute diastolic (congestive) heart failure (HCC) 01/12/2016  . Diabetes mellitus, new onset (HCC)   . Hypertension   . Morbid obesity (HCC)   . OSA (obstructive sleep apnea)     Patient Active Problem List   Diagnosis Date Noted  . Diabetic polyneuropathy associated with type 2 diabetes mellitus (HCC) 02/23/2018  . Diabetes mellitus type 2 in obese (HCC) 12/29/2017  . Diastolic congestive heart failure (HCC) 11/28/2017  . Proteinuria   . Erectile dysfunction 05/19/2017  . Depression 05/19/2017  . Essential hypertension 01/12/2016  . Sleep apnea 01/12/2016  . Abnormal EKG   . Hand pain, left 11/01/2015  . Morbid obesity (HCC) 11/01/2015    Past Surgical History:  Procedure Laterality Date  . NO PAST SURGERIES          Home Medications    Prior to  Admission medications   Medication Sig Start Date End Date Taking? Authorizing Provider  amLODipine (NORVASC) 10 MG tablet Take 1 tablet (10 mg total) by mouth daily. 02/14/18   Abelino Derrick, PA-C  aspirin 81 MG EC tablet Take 1 tablet (81 mg total) by mouth daily. 12/12/17   Marcine Matar, MD  atorvastatin (LIPITOR) 40 MG tablet Take 1 tablet (40 mg total) by mouth daily at 6 PM. 02/14/18   Kilroy, Eda Paschal, PA-C  carvedilol (COREG) 25 MG tablet Take 1 tablet (25 mg total) by mouth 2 (two) times daily with a meal. 02/14/18   Kilroy, Luke K, PA-C  FARXIGA 5 MG TABS tablet TAKE 5 MG BY MOUTH DAILY. 02/14/19   Marcine Matar, MD  furosemide (LASIX) 20 MG tablet TAKE 1 TABLET (20 MG TOTAL) BY MOUTH DAILY. 11/14/18   Marcine Matar, MD  glipiZIDE (GLUCOTROL) 10 MG tablet TAKE 1 TABLET (10 MG TOTAL) BY MOUTH 2 (TWO) TIMES DAILY BEFORE A MEAL. 01/24/19   Marcine Matar, MD  hydrALAZINE (APRESOLINE) 100 MG tablet Take 1 tablet (100 mg total) by mouth 3 (three) times daily. 75 mg PO TID 06/25/18   Marcine Matar, MD  hydrALAZINE (APRESOLINE) 50 MG tablet Take 50 mg by mouth 2 (two) times daily.  08/13/18   [provider]  hydrochlorothiazide (HYDRODIURIL) 25 MG tablet TAKE 1 TABLET (25 MG TOTAL) BY MOUTH  DAILY. 02/14/19   Ladell Pier, MD  Insulin Pen Needle 31G X 8 MM MISC Use as directed. 12/12/17   Ladell Pier, MD  losartan (COZAAR) 100 MG tablet TAKE 1 TABLET (100 MG TOTAL) BY MOUTH DAILY. 02/14/19   Ladell Pier, MD  metFORMIN (GLUCOPHAGE) 1000 MG tablet Take 1 tablet (1,000 mg total) by mouth 2 (two) times daily with a meal. 06/08/18   Ladell Pier, MD  VIAGRA 100 MG tablet TAKE 1 TABLET BY MOUTH 30 MINUTES BEFORE INTERCOURSE AS NEEDED. LIMIT TO ONE TABLET PER DAY Patient taking differently: Take 100 mg by mouth daily as needed for erectile dysfunction.  08/27/18   Ladell Pier, MD    Family History Family History  Problem Relation Age of Onset  .  Hypertension Other   . Diabetes Mellitus II Other     Social History Social History   Tobacco Use  . Smoking status: Never Smoker  . Smokeless tobacco: Never Used  Substance Use Topics  . Alcohol use: Yes    Alcohol/week: 0.0 standard drinks  . Drug use: No     Allergies   Imdur [isosorbide dinitrate] and Penicillins   Review of Systems Review of Systems  Constitutional: Negative for fever.  Endocrine:       Hyperglycemia  Genitourinary: Positive for frequency.  All other systems reviewed and are negative.    Physical Exam Updated Vital Signs BP 120/79 (BP Location: Right Arm)   Pulse 88   Temp 98.7 F (37.1 C)   Resp 16   SpO2 96%   Physical Exam Vitals signs and nursing note reviewed.  Constitutional:      General: He is not in acute distress.    Appearance: He is well-developed. He is obese.  HENT:     Head: Normocephalic and atraumatic.  Eyes:     Conjunctiva/sclera: Conjunctivae normal.  Cardiovascular:     Rate and Rhythm: Regular rhythm. Tachycardia present.  Pulmonary:     Effort: Pulmonary effort is normal.     Breath sounds: Normal breath sounds.  Abdominal:     General: Bowel sounds are normal.     Palpations: Abdomen is soft.     Tenderness: There is no abdominal tenderness. There is no guarding or rebound.  Musculoskeletal:     Right lower leg: Edema present.     Left lower leg: Edema present.  Skin:    General: Skin is warm.  Neurological:     Mental Status: He is alert.  Psychiatric:        Behavior: Behavior normal.      ED Treatments / Results  Labs (all labs ordered are listed, but only abnormal results are displayed) Labs Reviewed  BASIC METABOLIC PANEL - Abnormal; Notable for the following components:      Result Value   Sodium 130 (*)    Chloride 93 (*)    Glucose, Bld 476 (*)    Creatinine, Ser 1.35 (*)    All other components within normal limits  CBC - Abnormal; Notable for the following components:   RBC 6.43  (*)    MCV 69.7 (*)    MCH 24.3 (*)    All other components within normal limits  URINALYSIS, ROUTINE W REFLEX MICROSCOPIC - Abnormal; Notable for the following components:   Color, Urine STRAW (*)    Glucose, UA >=500 (*)    All other components within normal limits  CBG MONITORING, ED - Abnormal; Notable for the  following components:   Glucose-Capillary 430 (*)    All other components within normal limits  CBG MONITORING, ED - Abnormal; Notable for the following components:   Glucose-Capillary 323 (*)    All other components within normal limits    EKG None  Radiology No results found.  Procedures Procedures (including critical care time)  Medications Ordered in ED Medications  sodium chloride 0.9 % bolus 1,000 mL (0 mLs Intravenous Stopped 03/17/19 2308)  insulin aspart (novoLOG) injection 10 Units (10 Units Subcutaneous Given 03/17/19 2022)     Initial Impression / Assessment and Plan / ED Course  I have reviewed the triage vital signs and the nursing notes.  Pertinent labs & imaging results that were available during my care of the patient were reviewed by me and considered in my medical decision making (see chart for details).        Patient is a type II diabetic here with hyperglycemia.  Labs here do not show evidence of DKA.  He has no infectious symptoms and is in no distress.  Patient administered insulin and IV fluids with improvement in blood glucose.    At this time patient is safe for discharge.  Recommend good hydration, low carb and low sugar diet, and close follow-up with PCP to discuss hyperglycemia despite being compliant with medications.  Strict return precautions discussed.  Patient agreeable to plan and safe discharge.  Patient discussed with Dr. Silverio LayYao, who agrees with work-up and care plan for discharge.  Discussed results, findings, treatment and follow up. Patient advised of return precautions. Patient verbalized understanding and agreed with plan.    Final Clinical Impressions(s) / ED Diagnoses   Final diagnoses:  Type 2 diabetes mellitus with hyperglycemia, without long-term current use of insulin [facility](HCC)    ED Discharge Orders    None       [place] Souter, [place] N, PA-C 03/17/19 2338    Charlynne PanderYao, David Hsienta, MD 03/19/19 [phone]

## 2019-03-18 LAB — CBG MONITORING, ED: Glucose-Capillary: 450 mg/dL — ABNORMAL HIGH (ref 70–99)

## 2019-03-26 ENCOUNTER — Other Ambulatory Visit: Payer: Self-pay | Admitting: Cardiology

## 2019-03-26 DIAGNOSIS — I10 Essential (primary) hypertension: Secondary | ICD-10-CM

## 2019-03-26 MED FILL — glipiZIDE 10 MG TABS: 10 | 30 days supply | Qty: 60 | Fill #1

## 2019-03-26 MED FILL — metFORMIN HCL 1000 MG TABS: 1000 | 30 days supply | Qty: 60 | Fill #4

## 2019-03-26 MED FILL — HYDROCHLOROTHIAZIDE 25 MG T: 25 | 30 days supply | Qty: 30 | Fill #1

## 2019-03-26 MED FILL — CARVEDILOL 25 MG TABLET: 25 | 30 days supply | Qty: 60 | Fill #0

## 2019-03-26 MED FILL — FARXIGA 5 MG TABLET: 5 | 30 days supply | Qty: 30 | Fill #1

## 2019-03-26 MED FILL — LOSARTAN POTASSIUM 100 MG T: 100 | 30 days supply | Qty: 30 | Fill #1

## 2019-03-26 MED FILL — AMLODIPINE BESYLATE 10 MG T: 10 | 30 days supply | Qty: 30 | Fill #0

## 2019-04-30 ENCOUNTER — Other Ambulatory Visit: Payer: Self-pay | Admitting: Cardiology

## 2019-04-30 ENCOUNTER — Other Ambulatory Visit: Payer: Self-pay | Admitting: Internal Medicine

## 2019-04-30 DIAGNOSIS — E785 Hyperlipidemia, unspecified: Secondary | ICD-10-CM

## 2019-04-30 DIAGNOSIS — I10 Essential (primary) hypertension: Secondary | ICD-10-CM

## 2019-04-30 MED FILL — HYDROCHLOROTHIAZIDE 25 MG T: 25 | 30 days supply | Qty: 30 | Fill #2

## 2019-04-30 MED FILL — CARVEDILOL 25 MG TABLET: 25 | 30 days supply | Qty: 60 | Fill #1

## 2019-04-30 MED FILL — hydrALAZINE HCL 50 MG TABS: 50 | 30 days supply | Qty: 135 | Fill #4

## 2019-04-30 MED FILL — FARXIGA 5 MG TABLET: 5 | 30 days supply | Qty: 30 | Fill #2

## 2019-04-30 MED FILL — ATORVASTATIN CALCIUM 40 MG: 40 | 30 days supply | Qty: 30 | Fill #0

## 2019-04-30 MED FILL — FUROSEMIDE 20 MG TABS: 20 | 30 days supply | Qty: 30 | Fill #0

## 2019-04-30 MED FILL — LOSARTAN POTASSIUM 100 MG T: 100 | 30 days supply | Qty: 30 | Fill #2

## 2019-04-30 MED FILL — AMLODIPINE BESYLATE 10 MG T: 10 | 30 days supply | Qty: 30 | Fill #1

## 2019-04-30 MED FILL — glipiZIDE 10 MG TABS: 10 | 30 days supply | Qty: 60 | Fill #2

## 2019-04-30 MED FILL — metFORMIN HCL 1000 MG TABS: 1000 | 30 days supply | Qty: 60 | Fill #5

## 2019-06-06 ENCOUNTER — Other Ambulatory Visit: Payer: Self-pay | Admitting: Internal Medicine

## 2019-06-06 DIAGNOSIS — I10 Essential (primary) hypertension: Secondary | ICD-10-CM

## 2019-06-06 MED FILL — ATORVASTATIN CALCIUM 40 MG: 40 | 30 days supply | Qty: 30 | Fill #1

## 2019-06-06 MED FILL — hydrALAZINE HCL 50 MG TABS: 50 | 30 days supply | Qty: 135 | Fill #5

## 2019-06-06 MED FILL — AMLODIPINE BESYLATE 10 MG T: 10 | 30 days supply | Qty: 30 | Fill #2

## 2019-06-06 MED FILL — CARVEDILOL 25 MG TABLET: 25 | 30 days supply | Qty: 60 | Fill #2

## 2019-06-10 ENCOUNTER — Other Ambulatory Visit: Payer: Self-pay | Admitting: Internal Medicine

## 2019-06-10 DIAGNOSIS — I10 Essential (primary) hypertension: Secondary | ICD-10-CM

## 2019-06-11 ENCOUNTER — Other Ambulatory Visit: Payer: Self-pay | Admitting: Internal Medicine

## 2019-06-11 DIAGNOSIS — I10 Essential (primary) hypertension: Secondary | ICD-10-CM

## 2019-06-11 MED FILL — LOSARTAN POTASSIUM 100 MG T: 100 | 30 days supply | Qty: 30 | Fill #0

## 2019-06-11 MED FILL — metFORMIN HCL 1000 MG TABS: 1000 | 30 days supply | Qty: 60 | Fill #0

## 2019-06-11 MED FILL — HYDROCHLOROTHIAZIDE 25 MG T: 25 | 30 days supply | Qty: 30 | Fill #0

## 2019-06-11 MED FILL — FARXIGA 5 MG TABLET: 5 | 30 days supply | Qty: 30 | Fill #0

## 2019-06-11 MED FILL — FUROSEMIDE 20 MG TABS: 20 | 30 days supply | Qty: 30 | Fill #0

## 2019-06-11 MED FILL — glipiZIDE 10 MG TABS: 10 | 30 days supply | Qty: 60 | Fill #0

## 2019-06-28 ENCOUNTER — Other Ambulatory Visit: Payer: Self-pay

## 2019-06-28 ENCOUNTER — Ambulatory Visit: Payer: Medicaid Other | Attending: Internal Medicine | Admitting: Internal Medicine

## 2019-06-28 ENCOUNTER — Encounter: Payer: Self-pay | Admitting: Internal Medicine

## 2019-06-28 ENCOUNTER — Encounter: Payer: Self-pay | Admitting: Pharmacist

## 2019-06-28 ENCOUNTER — Other Ambulatory Visit: Payer: Self-pay | Admitting: Internal Medicine

## 2019-06-28 ENCOUNTER — Ambulatory Visit (HOSPITAL_BASED_OUTPATIENT_CLINIC_OR_DEPARTMENT_OTHER): Payer: Medicaid Other | Admitting: Pharmacist

## 2019-06-28 VITALS — BP 148/99 | HR 82 | Temp 98.8°F | Resp 16 | Wt >= 6400 oz

## 2019-06-28 DIAGNOSIS — E785 Hyperlipidemia, unspecified: Secondary | ICD-10-CM | POA: Diagnosis not present

## 2019-06-28 DIAGNOSIS — Z7984 Long term (current) use of oral hypoglycemic drugs: Secondary | ICD-10-CM | POA: Diagnosis not present

## 2019-06-28 DIAGNOSIS — I110 Hypertensive heart disease with heart failure: Secondary | ICD-10-CM | POA: Diagnosis not present

## 2019-06-28 DIAGNOSIS — I10 Essential (primary) hypertension: Secondary | ICD-10-CM

## 2019-06-28 DIAGNOSIS — Z888 Allergy status to other drugs, medicaments and biological substances status: Secondary | ICD-10-CM | POA: Insufficient documentation

## 2019-06-28 DIAGNOSIS — E1169 Type 2 diabetes mellitus with other specified complication: Secondary | ICD-10-CM

## 2019-06-28 DIAGNOSIS — N529 Male erectile dysfunction, unspecified: Secondary | ICD-10-CM | POA: Insufficient documentation

## 2019-06-28 DIAGNOSIS — Z79899 Other long term (current) drug therapy: Secondary | ICD-10-CM | POA: Insufficient documentation

## 2019-06-28 DIAGNOSIS — Z7982 Long term (current) use of aspirin: Secondary | ICD-10-CM | POA: Insufficient documentation

## 2019-06-28 DIAGNOSIS — Z7189 Other specified counseling: Secondary | ICD-10-CM

## 2019-06-28 DIAGNOSIS — Z6841 Body Mass Index (BMI) 40.0 and over, adult: Secondary | ICD-10-CM

## 2019-06-28 DIAGNOSIS — E1142 Type 2 diabetes mellitus with diabetic polyneuropathy: Secondary | ICD-10-CM | POA: Diagnosis not present

## 2019-06-28 DIAGNOSIS — Z880 Allergy status to penicillin: Secondary | ICD-10-CM | POA: Insufficient documentation

## 2019-06-28 DIAGNOSIS — G4733 Obstructive sleep apnea (adult) (pediatric): Secondary | ICD-10-CM

## 2019-06-28 DIAGNOSIS — E119 Type 2 diabetes mellitus without complications: Secondary | ICD-10-CM | POA: Diagnosis present

## 2019-06-28 DIAGNOSIS — I5030 Unspecified diastolic (congestive) heart failure: Secondary | ICD-10-CM | POA: Diagnosis not present

## 2019-06-28 DIAGNOSIS — Z9989 Dependence on other enabling machines and devices: Secondary | ICD-10-CM

## 2019-06-28 LAB — POCT GLYCOSYLATED HEMOGLOBIN (HGB A1C): HbA1c, POC (controlled diabetic range): 8.2 % — AB (ref 0.0–7.0)

## 2019-06-28 LAB — GLUCOSE, POCT (MANUAL RESULT ENTRY): POC Glucose: 115 mg/dl — AB (ref 70–99)

## 2019-06-28 MED ORDER — CARVEDILOL 25 MG PO TABS: 25.0000 mg | ORAL_TABLET | Freq: Two times a day (BID) | ORAL | 6 refills | Status: DC

## 2019-06-28 MED ORDER — METFORMIN HCL 1000 MG PO TABS: 1000.0000 mg | ORAL_TABLET | Freq: Two times a day (BID) | ORAL | 11 refills | Status: DC

## 2019-06-28 MED ORDER — AMLODIPINE BESYLATE 10 MG PO TABS: 10.0000 mg | ORAL_TABLET | Freq: Every day | ORAL | 6 refills | Status: DC

## 2019-06-28 MED ORDER — LOSARTAN POTASSIUM 100 MG PO TABS: 100.0000 mg | ORAL_TABLET | Freq: Every day | ORAL | 11 refills | Status: DC

## 2019-06-28 MED ORDER — TRUEPLUS PEN NEEDLES 32G X 4 MM MISC: 3 refills | Status: DC

## 2019-06-28 MED ORDER — VICTOZA 18 MG/3ML ~~LOC~~ SOPN: PEN_INJECTOR | SUBCUTANEOUS | 6 refills | Status: DC

## 2019-06-28 MED ORDER — HYDROCHLOROTHIAZIDE 25 MG PO TABS: 25.0000 mg | ORAL_TABLET | Freq: Every day | ORAL | 11 refills | Status: DC

## 2019-06-28 MED ORDER — FUROSEMIDE 20 MG PO TABS: 20.0000 mg | ORAL_TABLET | Freq: Every day | ORAL | 6 refills | Status: DC

## 2019-06-28 MED ORDER — GLIPIZIDE 10 MG PO TABS: 10.0000 mg | ORAL_TABLET | Freq: Two times a day (BID) | ORAL | 11 refills | Status: DC

## 2019-06-28 MED ORDER — HYDRALAZINE HCL 100 MG PO TABS: 100.0000 mg | ORAL_TABLET | Freq: Three times a day (TID) | ORAL | 6 refills | Status: DC

## 2019-06-28 MED ORDER — ATORVASTATIN CALCIUM 40 MG PO TABS: 40.0000 mg | ORAL_TABLET | Freq: Every day | ORAL | 6 refills | Status: DC

## 2019-06-28 MED FILL — VICTOZA 2-PAK 18 MG/3 ML PE: 18 | 17 days supply | Qty: 3 | Fill #0

## 2019-06-28 MED FILL — TRUEPLUS PEN NDL 32GX5/32: 32G X 4 MM | 30 days supply | Qty: 100 | Fill #0

## 2019-06-28 MED FILL — TRUEPLUS PEN NDL 32GX5/32": 32G X 4 MM | 30 days supply | Qty: 100 | Fill #0

## 2019-06-28 NOTE — Patient Instructions (Addendum)
Please give patient an appointment with the clinical pharmacist in 2 weeks for blood pressure recheck.   Start Victoza daily injection.  Continue to monitor blood sugars.   Please remember to get your eye exam done before the end of the year.

## 2019-06-28 NOTE — Progress Notes (Addendum)
Patient ID: Paul Lamb, male    DOB: September 23, 1985  MRN: [id number]  CC: Diabetes and Hypertension   Subjective: Paul Lamb is a 33 y.o. male who presents for chronic ds management His concerns today include:  Patient with history ofHTN,diastolic CHF with EF of50-55%, HTN, OSA on CPAP, HL, morbid obesity, diabetes with neuropathy, depression.  HYPERTENSION/dCHF Currently taking: see medication list.  He is on hydralazine 100 mg 3 times a day, carvedilol, hydrochlorothiazide, amlodipine, furosemide Med Adherence: [x]  Yes    []  No Medication side effects: []  Yes    [x]  No Adherence with salt restriction: []  Yes    [x]  No Home Monitoring?: []  Yes    [x]  No - device no longer works Monitoring Frequency: []  Yes    []  No Home BP results range: []  Yes    []  No SOB? []  Yes    [x]  No Chest Pain?: []  Yes    [x]  No Leg swelling?: []  Yes    [x]  No Headaches?: []  Yes    [x]  No Dizziness? []  Yes    [x]  No Comments:   DIABETES TYPE 2 Last A1C:   Results for orders placed or performed in visit on 06/28/19  Glucose (CBG)  Result Value Ref Range   POC Glucose 115 (A) 70 - 99 mg/dl  HgB  Result Value Ref Range   Hemoglobin A1C     HbA1c POC (<> result, manual entry)     HbA1c, POC (prediabetic range)     HbA1c, POC (controlled diabetic range) 8.2 (A) 0.0 - 7.0 %    Med Adherence:  [x]  Yes -he is on glipizide, Metformin and Farxiga   []  No Medication side effects:  []  Yes    []  No Home Monitoring?  []  Yes - 2-3 x a day    []  No Home glucose results range: a.m BS 130-160, afternoons before dinner 130-140 Diet Adherence: [x]  Yes - eating 2 meals.  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 []  Yes    [x]  No Retinopathy hx? []  Yes    [x]  No Last eye exam:  Over due for eye exam Comments:   OSA:  Compliant with CPAP  Patient Active  Problem List   Diagnosis Date Noted  . Diabetic polyneuropathy associated with type 2 diabetes mellitus (HCC) 02/23/2018  . Diabetes mellitus type 2 in obese (HCC) 12/29/2017  . Diastolic congestive heart failure (HCC) 11/28/2017  . Proteinuria   . Erectile dysfunction 05/19/2017  . Depression 05/19/2017  . Essential hypertension 01/12/2016  . Sleep apnea 01/12/2016  . Abnormal EKG   . Hand pain, left 11/01/2015  . Morbid obesity (HCC) 11/01/2015     Current Outpatient Medications on File Prior to Visit  Medication Sig Dispense Refill  . amLODipine (NORVASC) 10 MG tablet TAKE 1 TABLET (10 MG TOTAL) BY MOUTH DAILY. 30 tablet 6  . aspirin 81 MG EC tablet Take 1 tablet (81 mg total) by mouth daily. 100 tablet 1  . atorvastatin (LIPITOR) 40 MG tablet TAKE 1 TABLET (40 MG TOTAL) BY MOUTH DAILY AT 6 PM. 30 tablet 6  . carvedilol (COREG) 25 MG tablet TAKE 1 TABLET (25 MG TOTAL) BY MOUTH 2 (TWO) TIMES DAILY WITH A MEAL. 60 tablet 6  . FARXIGA 5 MG TABS tablet TAKE 1 TABLET  BY MOUTH ONCE A DAY 30 tablet 0  . furosemide (LASIX) 20 MG tablet TAKE 1 TABLET (20 MG TOTAL) BY MOUTH DAILY. 30 tablet 0  . glipiZIDE (GLUCOTROL) 10 MG tablet TAKE 1 TABLET BY MOUTH 2 TIMES DAILY BEFORE A MEAL. 60 tablet 0  . hydrALAZINE (APRESOLINE) 100 MG tablet Take 1 tablet (100 mg total) by mouth 3 (three) times daily. 75 mg PO TID 90 tablet 6  . hydrALAZINE (APRESOLINE) 50 MG tablet Take 50 mg by mouth 2 (two) times daily.     . hydrochlorothiazide (HYDRODIURIL) 25 MG tablet TAKE 1 TABLET (25 MG TOTAL) BY MOUTH DAILY. 30 tablet 0  . Insulin Pen Needle 31G X 8 MM MISC Use as directed. 100 each 4  . losartan (COZAAR) 100 MG tablet TAKE 1 TABLET (100 MG TOTAL) BY MOUTH DAILY. 30 tablet 0  . metFORMIN (GLUCOPHAGE) 1000 MG tablet TAKE 1 TABLET (1,000 MG TOTAL) BY MOUTH 2 (TWO) TIMES DAILY WITH A MEAL. 60 tablet 0  . VIAGRA 100 MG tablet TAKE 1 TABLET BY MOUTH 30 MINUTES BEFORE INTERCOURSE AS NEEDED. LIMIT TO ONE TABLET PER  DAY (Patient taking differently: Take 100 mg by mouth daily as needed for erectile dysfunction. ) 30 tablet 3   No current facility-administered medications on file prior to visit.     Allergies  Allergen Reactions  . Imdur [Isosorbide Dinitrate] Other (See Comments)    Headaches   . Penicillins Other (See Comments)    From childhood; reaction not known: Has patient had a PCN reaction causing immediate rash, facial/tongue/throat swelling, SOB or lightheadedness with hypotension: Unk Has patient had a PCN reaction causing severe rash involving mucus membranes or skin necrosis: Unk Has patient had a PCN reaction that required hospitalization: Unk Has patient had a PCN reaction occurring within the last 10 years: No If all of the above answers are "NO", then may proceed with Cephalosporin use.     Social History   Socioeconomic History  . Marital status: Single    Spouse name: Not on file  . Number of children: Not on file  . Years of education: Not on file  . Highest education level: Not on file  Occupational History  . Not on file  Social Needs  . Financial resource strain: Not on file  . Food insecurity    Worry: Not on file    Inability: Not on file  . Transportation needs    Medical: Not on file    Non-medical: Not on file  Tobacco Use  . Smoking status: Never Smoker  . Smokeless tobacco: Never Used  Substance and Sexual Activity  . Alcohol use: Yes    Alcohol/week: 0.0 standard drinks  . Drug use: No  . Sexual activity: Yes  Lifestyle  . Physical activity    Days per week: 0 days    Minutes per session: 0 min  . Stress: To some extent  Relationships  . Social Musicianconnections    Talks on phone: Patient refused    Gets together: Patient refused    Attends religious service: Patient refused    Active member of club or organization: Patient refused    Attends meetings of clubs or organizations: Patient refused    Relationship status: Patient refused  . Intimate  partner violence    Fear of current or ex partner: No    Emotionally abused: No    Physically abused: No    Forced sexual activity: No  Other Topics Concern  .  Not on file  Social History Narrative  . Not on file    Family History  Problem Relation Age of Onset  . Hypertension Other   . Diabetes Mellitus II Other     Past Surgical History:  Procedure Laterality Date  . NO PAST SURGERIES      ROS: Review of Systems Negative except as stated above  PHYSICAL EXAM: BP (!) 152/105   Pulse 82   Temp 98.8 F (37.1 C) (Oral)   Resp 16   Wt (!) 418 lb (189.6 kg)   SpO2 96%   BMI 56.69 kg/m   Wt Readings from Last 3 Encounters:  06/28/19 (!) 418 lb (189.6 kg)  02/08/19 (!) 413 lb (187.3 kg)  11/09/18 (!) 413 lb 9.6 oz (187.6 kg)   BP 148/99 Physical Exam General appearance - alert, well appearing, morbidly obese young male and in no distress Mental status - normal mood, behavior, speech, dress, motor activity, and thought processes Mouth - mucous membranes moist, pharynx normal without lesions Neck - supple, no significant adenopathy Chest - clear to auscultation, no wheezes, rales or rhonchi, symmetric air entry Heart - normal rate, regular rhythm, normal S1, S2, no murmurs, rubs, clicks or gallops Extremities - peripheral pulses normal, no pedal edema, no clubbing or cyanosis  CMP Latest Ref Rng & Units 03/17/2019 11/09/2018 10/05/2018  Glucose 70 - 99 mg/dL 625(W) 94 85  BUN 6 - 20 mg/dL 15 12 18   Creatinine 0.61 - 1.24 mg/dL 3.89(H) 7.34 2.87(G)  Sodium 135 - 145 mmol/L 130(L) 145(H) 138  Potassium 3.5 - 5.1 mmol/L 3.8 5.2 3.6  Chloride 98 - 111 mmol/L 93(L) 105 100  CO2 22 - 32 mmol/L 22 15(L) 27  Calcium 8.9 - 10.3 mg/dL 81.1 10.4(H) 9.3  Total Protein 6.5 - 8.1 g/dL - - 8.8(H)  Total Bilirubin 0.3 - 1.2 mg/dL - - 1.2  Alkaline Phos 38 - 126 U/L - - 71  AST 15 - 41 U/L - - 17  ALT 0 - 44 U/L - - 18   Lipid Panel     Component Value Date/Time   CHOL 137  12/07/2018 1613   TRIG 167 (H) 12/07/2018 1613   HDL 30 (L) 12/07/2018 1613   CHOLHDL 4.6 12/07/2018 1613   CHOLHDL 7.6 09/10/2017 0451   VLDL 29 09/10/2017 0451   LDLCALC 74 12/07/2018 1613    CBC    Component Value Date/Time   WBC 9.3 03/17/2019 1900   RBC 6.43 (H) 03/17/2019 1900   HGB 15.6 03/17/2019 1900   HGB 14.8 05/19/2017 1344   HCT 44.8 03/17/2019 1900   HCT 43.8 05/19/2017 1344   PLT 274 03/17/2019 1900   PLT 233 05/19/2017 1344   MCV 69.7 (L) 03/17/2019 1900   MCV 72 (L) 05/19/2017 1344   MCH 24.3 (L) 03/17/2019 1900   MCHC 34.8 03/17/2019 1900   RDW 13.3 03/17/2019 1900   RDW 16.3 (H) 05/19/2017 1344   LYMPHSABS 1.9 11/01/2015 1043   MONOABS 0.6 11/01/2015 1043   EOSABS 0.1 11/01/2015 1043   BASOSABS 0.1 11/01/2015 1043    ASSESSMENT AND PLAN:  1. Type 2 diabetes mellitus with diabetic polyneuropathy, without long-term current use of insulin (HCC) Not controlled.  Continue current oral medications. His job is very sedentary driving trucks.  Encouraged him to try to walk at least 30 minutes a day.  He is wanting to get his weight down more.  He has been doing better with his eating habits.  We have discussed adding Victoza.  I went over how Victoza work and how it can help with weight loss.  Advised patient that it can cause some nausea due to the fact that it slows down gastric emptying.  He is willing to give the medication a try.  Advised him to continue to monitor blood sugars. Patient reminded to get eye exam done. - Glucose (CBG) - HgB A1c - Microalbumin/Creatinine Ratio, Urine - Basic Metabolic Panel - liraglutide (VICTOZA) 18 MG/3ML SOPN; Start 0.6mg  SQ once a day for 7 days, then increase to 1.2mg  once a day  Dispense: 4 pen; Refill: 6 - glipiZIDE (GLUCOTROL) 10 MG tablet; Take 1 tablet (10 mg total) by mouth 2 (two) times daily before a meal.  Dispense: 60 tablet; Refill: 11 - metFORMIN (GLUCOPHAGE) 1000 MG tablet; Take 1 tablet (1,000 mg total) by  mouth 2 (two) times daily with a meal.  Dispense: 60 tablet; Refill: 11  2. Essential hypertension Not at goal but he has not taken the second dose of hydralazine for the day.  I will have him follow-up with our clinical pharmacist in about 2 weeks for repeat blood pressure check.  We will continue current medications I will give a prescription to our caseworker to see if she can get blood pressure monitoring device for him through Medicaid - amLODipine (NORVASC) 10 MG tablet; Take 1 tablet (10 mg total) by mouth daily.  Dispense: 30 tablet; Refill: 6 - hydrALAZINE (APRESOLINE) 100 MG tablet; Take 1 tablet (100 mg total) by mouth 3 (three) times daily.  Dispense: 90 tablet; Refill: 6 - losartan (COZAAR) 100 MG tablet; Take 1 tablet (100 mg total) by mouth daily.  Dispense: 30 tablet; Refill: 11 - carvedilol (COREG) 25 MG tablet; Take 1 tablet (25 mg total) by mouth 2 (two) times daily with a meal.  Dispense: 60 tablet; Refill: 6 - furosemide (LASIX) 20 MG tablet; Take 1 tablet (20 mg total) by mouth daily.  Dispense: 30 tablet; Refill: 6 - hydrochlorothiazide (HYDRODIURIL) 25 MG tablet; Take 1 tablet (25 mg total) by mouth daily.  Dispense: 30 tablet; Refill: 11  3. Hyperlipidemia associated with type 2 diabetes mellitus (HCC) - atorvastatin (LIPITOR) 40 MG tablet; Take 1 tablet (40 mg total) by mouth daily at 6 PM.  Dispense: 30 tablet; Refill: 6  4. Diastolic congestive heart failure, unspecified HF chronicity ([place]) Compensated.  Continue current medications and low-salt diet - furosemide (LASIX) 20 MG tablet; Take 1 tablet (20 mg total) by mouth daily.  Dispense: 30 tablet; Refill: 6  5. Morbid obesity ([place]) See #1 above  6. OSA on CPAP Commended him on compliance with CPAP and encouraged him to continue to be compliant.   Patient was given the opportunity to ask questions.  Patient verbalized understanding of the plan and was able to repeat key elements of the plan.   Addendum:  Pt  reports and is benefiting from use of his CPAP machine.  Orders Placed This Encounter  Procedures  . Microalbumin/Creatinine Ratio, Urine  . Glucose (CBG)  . HgB A1c     Requested Prescriptions    No prescriptions requested or ordered in this encounter    No follow-ups on file.  Karle Plumber, MD, FACP

## 2019-06-28 NOTE — Progress Notes (Signed)
Patient was educated on the use of the Victoza pen. Reviewed necessary supplies and operation of the pen. Also reviewed goal blood glucose levels. Patient was able to demonstrate use. All questions and concerns were addressed.  

## 2019-06-29 LAB — BASIC METABOLIC PANEL
BUN/Creatinine Ratio: 14 (ref 9–20)
BUN: 15 mg/dL (ref 6–20)
CO2: 26 mmol/L (ref 20–29)
Calcium: 9.6 mg/dL (ref 8.7–10.2)
Chloride: 99 mmol/L (ref 96–106)
Creatinine, Ser: 1.07 mg/dL (ref 0.76–1.27)
GFR calc Af Amer: 106 mL/min/{1.73_m2} (ref >59)
GFR calc non Af Amer: 92 mL/min/{1.73_m2} (ref >59)
Glucose: 87 mg/dL (ref 65–99)
Potassium: 3.7 mmol/L (ref 3.5–5.2)
Sodium: 140 mmol/L (ref 134–144)

## 2019-07-01 ENCOUNTER — Telehealth: Payer: Self-pay

## 2019-07-01 NOTE — Telephone Encounter (Signed)
Call placed to patient to inquire if he has a preference for DME provider for home BP monitor. He had no preference and referral was faxed to [place]  He then stated that he has been using his CPAP but does not feel that it is working as well as usual.. He said that the CPAP machine that he has was " given" to him by [facility] when he did not have insurance.  Informed him that this CM would need to check to see if he would be eligible for a new machine now that he has medicaid.  He could also call [employer] and inquire if they would provide any service to the current machine.   Call placed to [employer], spoke to [place] who stated that they could submit and order for medicaid for a new machine even though the sleep study was done in 2017.  She said that the PCP would need to have a face to face encounter with the patient stating his compliance with use of the machine as well as the benefit to him from use of the machine. PCP's noted from 06/28/2019 indicates patient's compliance.  Sonia Baller stated that an addendum to the note is needed to indicate the benefit from use of the machine.

## 2019-07-08 ENCOUNTER — Telehealth: Payer: Self-pay

## 2019-07-08 NOTE — Telephone Encounter (Signed)
Order for CPAP faxed to [place]. Addendum has been added to the provider's note.

## 2019-07-11 MED FILL — CARVEDILOL 25 MG TABLET: 25 | 30 days supply | Qty: 60 | Fill #0

## 2019-07-11 MED FILL — FARXIGA 5 MG TABLET: 5 | 30 days supply | Qty: 30 | Fill #0

## 2019-07-11 MED FILL — LOSARTAN POTASSIUM 100 MG T: 100 | 30 days supply | Qty: 30 | Fill #0

## 2019-07-11 MED FILL — HYDROCHLOROTHIAZIDE 25 MG T: 25 | 30 days supply | Qty: 30 | Fill #0

## 2019-07-11 MED FILL — AMLODIPINE BESYLATE 10 MG T: 10 | 30 days supply | Qty: 30 | Fill #0

## 2019-07-11 MED FILL — glipiZIDE 10 MG TABS: 10 | 30 days supply | Qty: 60 | Fill #0

## 2019-07-11 MED FILL — FUROSEMIDE 20 MG TABS: 20 | 30 days supply | Qty: 30 | Fill #0

## 2019-07-11 MED FILL — metFORMIN HCL 1000 MG TABS: 1000 | 30 days supply | Qty: 60 | Fill #0

## 2019-07-11 MED FILL — ATORVASTATIN CALCIUM 40 MG: 40 | 30 days supply | Qty: 30 | Fill #0

## 2019-07-15 ENCOUNTER — Other Ambulatory Visit: Payer: Self-pay

## 2019-07-15 ENCOUNTER — Other Ambulatory Visit: Payer: Self-pay | Admitting: Pharmacist

## 2019-07-15 ENCOUNTER — Ambulatory Visit: Payer: Medicaid Other | Attending: Internal Medicine | Admitting: Pharmacist

## 2019-07-15 ENCOUNTER — Encounter: Payer: Self-pay | Admitting: Pharmacist

## 2019-07-15 VITALS — BP 119/81 | HR 97

## 2019-07-15 DIAGNOSIS — I10 Essential (primary) hypertension: Secondary | ICD-10-CM | POA: Insufficient documentation

## 2019-07-15 DIAGNOSIS — Z8249 Family history of ischemic heart disease and other diseases of the circulatory system: Secondary | ICD-10-CM | POA: Diagnosis not present

## 2019-07-15 MED ORDER — VICTOZA 18 MG/3ML ~~LOC~~ SOPN: 1.8000 mg | PEN_INJECTOR | Freq: Every day | SUBCUTANEOUS | 2 refills | Status: DC

## 2019-07-15 MED FILL — VICTOZA 18 MG/3 ML INJECT P: 18 | 30 days supply | Qty: 3 | Fill #0

## 2019-07-15 NOTE — Patient Instructions (Signed)

## 2019-07-15 NOTE — Progress Notes (Signed)
   S:    PCP: Dr. Wynetta Emery  Patient arrives in good spirits.    Presents to the clinic for hypertension evaluation, counseling, and management.  Patient was referred and last seen by Primary Care Provider on 06/28/19. BP at that visit was 148/99.   Patient reports adherence with medications.   Denies chest pain, dyspnea, HA or blurred vision.   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  Family / Social history:  - FHx: HTN, DM - Tobacco: never smoker - Alcohol: denies use   O:  Vitals:   07/15/19 0855  BP: 119/81  Pulse: 97    Home BP readings: not taking   Last 3 Office BP readings: BP Readings from Last 3 Encounters:  07/15/19 119/81  06/28/19 (!) 148/99  03/17/19 115/80   BMET    Component Value Date/Time   NA 140 06/28/2019 1515   K 3.7 06/28/2019 1515   CL 99 06/28/2019 1515   CO2 26 06/28/2019 1515   GLUCOSE 87 06/28/2019 1515   GLUCOSE 476 (H) 03/17/2019 1900   BUN 15 06/28/2019 1515   CREATININE 1.07 06/28/2019 1515   CREATININE 1.05 11/25/2015 1324   CALCIUM 9.6 06/28/2019 1515   GFRNONAA 92 06/28/2019 1515   GFRAA 106 06/28/2019 1515    Renal function: CrCl cannot be calculated (Unknown ideal weight.).  Clinical ASCVD: No  The ASCVD Risk score Mikey Bussing DC Jr., et al., 2013) failed to calculate for the following reasons:   The 2013 ASCVD risk score is only valid for ages 52 to 52  A/P: Hypertension longstanding currently controlled on current medications. BP Goal = <130/80 mmHg. Patient reports adherence to medications; took this AM before appointment. His elevated BP in clinic last time was most likely due to him not having his afternoon hydralazine dose before seeing [place]. No recommendations for  changes at this time.  -Continued current regimen.  -Counseled on lifestyle modifications for blood pressure control including reduced dietary sodium, increased exercise, adequate sleep  Results reviewed and written information provided.   Total time in face-to-face counseling 15 minutes.   F/U Clinic Visit in Feb with PCP.   Benard Halsted, PharmD, [place] [phone]

## 2019-08-05 ENCOUNTER — Telehealth: Payer: Self-pay

## 2019-08-05 NOTE — Telephone Encounter (Signed)
Call placed to [facility], spoke to [place] who confirmed that the patient was set up with his CPAP on 07/19/2019

## 2019-08-12 MED FILL — LOSARTAN POTASSIUM 100 MG T: 100 | 90 days supply | Qty: 90 | Fill #1

## 2019-08-12 MED FILL — FARXIGA 5 MG TABLET: 5 | 30 days supply | Qty: 30 | Fill #1

## 2019-08-12 MED FILL — AMLODIPINE BESYLATE 10 MG T: 10 | 90 days supply | Qty: 90 | Fill #1

## 2019-08-12 MED FILL — ATORVASTATIN CALCIUM 40 MG: 40 | 90 days supply | Qty: 90 | Fill #1

## 2019-08-12 MED FILL — glipiZIDE 10 MG TABS: 10 | 90 days supply | Qty: 180 | Fill #1

## 2019-08-12 MED FILL — FUROSEMIDE 20 MG TABS: 20 | 90 days supply | Qty: 90 | Fill #1

## 2019-08-12 MED FILL — VICTOZA 2-PAK 18 MG/3 ML PE: 18 | 20 days supply | Qty: 6 | Fill #1

## 2019-08-12 MED FILL — HYDROCHLOROTHIAZIDE 25 MG T: 25 | 90 days supply | Qty: 90 | Fill #1

## 2019-09-01 ENCOUNTER — Other Ambulatory Visit: Payer: Self-pay

## 2019-09-01 ENCOUNTER — Emergency Department (HOSPITAL_COMMUNITY): Payer: Medicaid Other

## 2019-09-01 ENCOUNTER — Emergency Department (HOSPITAL_COMMUNITY)
Admission: EM | Admit: 2019-09-01 | Discharge: 2019-09-01 | Disposition: A | Discharge status: Home / Self Care | Payer: Medicaid Other | Attending: Emergency Medicine | Admitting: Emergency Medicine

## 2019-09-01 DIAGNOSIS — E119 Type 2 diabetes mellitus without complications: Secondary | ICD-10-CM | POA: Diagnosis not present

## 2019-09-01 DIAGNOSIS — I110 Hypertensive heart disease with heart failure: Secondary | ICD-10-CM | POA: Insufficient documentation

## 2019-09-01 DIAGNOSIS — Z7984 Long term (current) use of oral hypoglycemic drugs: Secondary | ICD-10-CM | POA: Insufficient documentation

## 2019-09-01 DIAGNOSIS — Z79899 Other long term (current) drug therapy: Secondary | ICD-10-CM | POA: Insufficient documentation

## 2019-09-01 DIAGNOSIS — M109 Gout, unspecified: Secondary | ICD-10-CM | POA: Diagnosis not present

## 2019-09-01 DIAGNOSIS — Z7982 Long term (current) use of aspirin: Secondary | ICD-10-CM | POA: Insufficient documentation

## 2019-09-01 DIAGNOSIS — I5032 Chronic diastolic (congestive) heart failure: Secondary | ICD-10-CM | POA: Insufficient documentation

## 2019-09-01 DIAGNOSIS — M79672 Pain in left foot: Secondary | ICD-10-CM | POA: Diagnosis present

## 2019-09-01 MED ORDER — INDOMETHACIN 50 MG PO CAPS: 50.0000 mg | ORAL_CAPSULE | Freq: Two times a day (BID) | ORAL | 0 refills | Status: DC

## 2019-09-01 MED ORDER — INDOMETHACIN 50 MG PO CAPS: 50.0000 mg | ORAL_CAPSULE | Freq: Two times a day (BID) | ORAL | 0 refills | Status: AC

## 2019-09-01 MED ORDER — NAPROXEN 500 MG PO TABS
500.0000 mg | ORAL_TABLET | Freq: Once | ORAL | Status: AC
  Administered 2019-09-01: 500 mg via ORAL
  Filled 2019-09-01: qty 1

## 2019-09-01 MED ORDER — HYDROCODONE-ACETAMINOPHEN 5-325 MG PO TABS
1.0000 | ORAL_TABLET | Freq: Once | ORAL | Status: AC
  Administered 2019-09-01: 1 via ORAL
  Filled 2019-09-01: qty 1

## 2019-09-01 NOTE — ED Notes (Signed)
Pt verbalizes understanding of DC instructions. Pt belongings returned and is ambulatory out of ED.  

## 2019-09-01 NOTE — ED Provider Notes (Signed)
[facility] DEPT Provider Note   CSN: [id number] Arrival date & time: 09/01/19  1424     History Chief Complaint  Patient presents with  . Foot Pain    Left    Aztlan Coll Goyne is a 34 y.o. male.  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  Patient denies any injury or trauma.  Reports constant pain but it is worse with putting pressure on the foot.  The pain is worse in the big toe.  Reports history of the same in the past but the pain resolved on its own so he never had it evaluated.  Denies any fever, chills, numbness, tingling        Past Medical History:  Diagnosis Date  . Acute combined systolic and diastolic CHF, NYHA class 3 ([place]) 11/18/2016   EF now 40-45% by echo  . Acute diastolic (congestive) heart failure ([place]) 01/12/2016  . Diabetes mellitus, new onset ([place])   . Hypertension   . Morbid obesity ([place])   . OSA (obstructive sleep apnea)     Patient Active Problem List   Diagnosis Date Noted  . Diabetic polyneuropathy associated with type 2 diabetes mellitus ([place]) 02/23/2018  . Diabetes mellitus type 2 in obese ([place]) 12/29/2017  . Diastolic congestive heart failure ([place]) 11/28/2017  . Proteinuria   . Erectile dysfunction 05/19/2017  . Depression 05/19/2017  . Essential hypertension 01/12/2016  . Sleep apnea 01/12/2016  . Abnormal EKG   . Hand pain, left 11/01/2015  . Morbid obesity ([place]) 11/01/2015    Past Surgical History:  Procedure Laterality Date  . NO PAST SURGERIES         Family History  Problem Relation Age of Onset  . Hypertension Other   . Diabetes Mellitus II Other     Social History   Tobacco Use  . Smoking status: Never Smoker  . Smokeless tobacco: Never Used  Substance Use Topics  . Alcohol use: Yes    Alcohol/week: 0.0 standard drinks  . Drug use: No    Home  Medications Prior to Admission medications   Medication Sig Start Date End Date Taking? Authorizing Provider  amLODipine (NORVASC) 10 MG tablet Take 1 tablet (10 mg total) by mouth daily. 06/28/19   Ladell Pier, MD  aspirin 81 MG EC tablet Take 1 tablet (81 mg total) by mouth daily. 12/12/17   Ladell Pier, MD  atorvastatin (LIPITOR) 40 MG tablet Take 1 tablet (40 mg total) by mouth daily at 6 PM. 06/28/19   Ladell Pier, MD  carvedilol (COREG) 25 MG tablet Take 1 tablet (25 mg total) by mouth 2 (two) times daily with a meal. 06/28/19   Ladell Pier, MD  FARXIGA 5 MG TABS tablet TAKE 1 TABLET BY MOUTH ONCE A DAY 06/28/19   Ladell Pier, MD  furosemide (LASIX) 20 MG tablet Take 1 tablet (20 mg total) by mouth daily. 06/28/19   Ladell Pier, MD  glipiZIDE (GLUCOTROL) 10 MG tablet Take 1 tablet (10 mg total) by mouth 2 (two) times daily before a meal. 06/28/19   Ladell Pier, MD  hydrALAZINE (APRESOLINE) 100 MG tablet Take 1 tablet (100 mg total) by mouth 3 (three) times daily. 06/28/19   Ladell Pier, MD  hydrochlorothiazide (HYDRODIURIL) 25 MG tablet Take 1 tablet (25 mg total) by mouth daily. 06/28/19   Wynetta Emery,  Binnie Rail, MD  Insulin Pen Needle (TRUEPLUS PEN NEEDLES) 32G X 4 MM MISC Use to inject Victoza daily. 06/28/19   Marcine Matar, MD  liraglutide (VICTOZA) 18 MG/3ML SOPN Inject 0.3 mLs (1.8 mg total) into the skin daily. 07/15/19   Marcine Matar, MD  losartan (COZAAR) 100 MG tablet Take 1 tablet (100 mg total) by mouth daily. 06/28/19   Marcine Matar, MD  metFORMIN (GLUCOPHAGE) 1000 MG tablet Take 1 tablet (1,000 mg total) by mouth 2 (two) times daily with a meal. 06/28/19   Marcine Matar, MD  VIAGRA 100 MG tablet TAKE 1 TABLET BY MOUTH 30 MINUTES BEFORE INTERCOURSE AS NEEDED. LIMIT TO ONE TABLET PER DAY Patient taking differently: Take 100 mg by mouth daily as needed for erectile dysfunction.  08/27/18   Marcine Matar, MD     Allergies    Imdur [isosorbide dinitrate] and Penicillins  Review of Systems   Review of Systems  Constitutional: Negative for chills and fever.  Gastrointestinal: Negative for nausea and vomiting.  Musculoskeletal: Positive for arthralgias, gait problem and joint swelling. Negative for back pain and neck pain.  Skin: Negative for rash and wound.  Neurological: Negative for dizziness, light-headedness and numbness.    Physical Exam Updated Vital Signs BP (!) 141/95   Pulse 99   Temp 98.3 F (36.8 C)   Resp 18   Ht 6' (1.829 m)   Wt (!) 190.1 kg   SpO2 97%   BMI 56.83 kg/m   Physical Exam Vitals and nursing note reviewed.  Constitutional:      General: He is not in acute distress.    Appearance: Normal appearance. He is obese. He is not ill-appearing, toxic-appearing or diaphoretic.  HENT:     Head: Normocephalic.     Mouth/Throat:     Mouth: Mucous membranes are moist.  Eyes:     Conjunctiva/sclera: Conjunctivae normal.  Pulmonary:     Effort: Pulmonary effort is normal.  Musculoskeletal:       Feet:  Feet:     Comments: Diffuse swelling of the MTP of the great toe extending to the plantar arch with some TTP in the plantar arch. No heel of achilles tenderness. Normal distal pulses and ROM Skin:    General: Skin is warm and dry.     Capillary Refill: Capillary refill takes less than 2 seconds.     Coloration: Skin is not jaundiced or pale.     Findings: No bruising, erythema, lesion or rash.  Neurological:     General: No focal deficit present.     Mental Status: He is alert.     Cranial Nerves: No cranial nerve deficit.     Motor: No weakness.  Psychiatric:        Mood and Affect: Mood normal.     ED Results / Procedures / Treatments   Labs (all labs ordered are listed, but only abnormal results are displayed) Labs Reviewed - No data to display  EKG None  Radiology No results found.  Procedures Procedures (including critical care time)   Medications Ordered in ED Medications  naproxen (NAPROSYN) tablet 500 mg (has no administration in time range)  HYDROcodone-acetaminophen (NORCO/VICODIN) 5-325 MG per tablet 1 tablet (has no administration in time range)    ED Course  I have reviewed the triage vital signs and the nursing notes.  Pertinent labs & imaging results that were available during my care of the patient were reviewed by me and  considered in my medical decision making (see chart for details).  Clinical Course as of Sep 02 830  Wynelle Link Sep 01, 2019  1530 Atraumatic L foot pain, mostly in the [place] tow MTP. Xray normal. Likely gout. Has diabetes. Last a!c over 8 and last creatinin was 1.26. will try indomethacin for now. F/u pmd   [KM]    Clinical Course User Index [KM] Jeral Pinch   MDM Rules/Calculators/A&P                      Based on review of vitals, medical screening exam, lab work and/or imaging, there does not appear to be an acute, emergent etiology for the patient's symptoms. Counseled pt on good return precautions and encouraged both PCP and ED follow-up as needed.  Prior to discharge, I also discussed incidental imaging findings with patient in detail and advised appropriate, recommended follow-up in detail.  Clinical Impression: 1. Acute gout involving toe of left foot, unspecified cause     Disposition: Discharge  Prior to providing a prescription for a controlled substance, I independently reviewed the patient's recent prescription history on the [place] Controlled Substance Reporting System. The patient had no recent or regular prescriptions and was deemed appropriate for a brief, less than 3 day prescription of narcotic for acute analgesia.  This note was prepared with assistance of Conservation officer, historic buildings. Occasional wrong-word or sound-a-like substitutions may have occurred due to the inherent limitations of voice recognition software.  Final Clinical Impression(s)  / ED Diagnoses Final diagnoses:  None    Rx / DC Orders ED Discharge Orders    None       Gerhard Munch, MD 09/04/19 1628

## 2019-09-01 NOTE — ED Triage Notes (Addendum)
Pt arrived POV ambulatory into ED from home CC left foot pain X2-3 days. Pt denies injury, open wounds, sweelling or OTC pain relief. Pt states it hurts with or without pressure and this happened last year but resolved itself.   VSS Afebrile   Hx HTN. 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

## 2019-09-01 NOTE — ED Notes (Signed)
Pt ambulatory to RR w/o assistance

## 2019-09-01 NOTE — Discharge Instructions (Addendum)
I think you probably have gout. Your xray was normal. Thank you for allowing me to care for you today. Please return to the emergency department if you have new or worsening symptoms. Take your medications as instructed.

## 2019-09-01 NOTE — ED Notes (Signed)
PA at bedside.

## 2019-09-02 ENCOUNTER — Telehealth: Payer: Self-pay | Admitting: Cardiovascular Disease

## 2019-09-02 DIAGNOSIS — I517 Cardiomegaly: Secondary | ICD-10-CM

## 2019-09-02 MED FILL — INDOMETHACIN 50 MG CAPSULE: 50 | 5 days supply | Qty: 10 | Fill #0

## 2019-09-02 NOTE — Telephone Encounter (Signed)
That is fine with me to get a 2D echo this gentleman

## 2019-09-02 NOTE — Telephone Encounter (Signed)
New Message  Patient is calling in to request an order for an echocardiogram be put on his chart. Patient states that he needs an echocardiogram for work. Please assist with this request.

## 2019-09-03 MED FILL — CARVEDILOL 25 MG TABLET: 25 | 30 days supply | Qty: 60 | Fill #1

## 2019-09-03 MED FILL — metFORMIN HCL 1000 MG TABS: 1000 | 30 days supply | Qty: 60 | Fill #1

## 2019-09-03 MED FILL — hydrALAZINE HCL 100 MG TABS: 100 | 30 days supply | Qty: 90 | Fill #0

## 2019-09-12 ENCOUNTER — Other Ambulatory Visit: Payer: Self-pay | Admitting: Internal Medicine

## 2019-09-12 MED FILL — FARXIGA 5 MG TABLET: 5 | 30 days supply | Qty: 30 | Fill #2

## 2019-09-13 ENCOUNTER — Ambulatory Visit (HOSPITAL_COMMUNITY): Payer: Medicaid Other | Attending: Cardiology

## 2019-09-13 ENCOUNTER — Other Ambulatory Visit: Payer: Self-pay

## 2019-09-13 DIAGNOSIS — I517 Cardiomegaly: Secondary | ICD-10-CM | POA: Diagnosis not present

## 2019-09-13 MED ORDER — PERFLUTREN LIPID MICROSPHERE
1.0000 mL | INTRAVENOUS | Status: AC | PRN
  Administered 2019-09-13: 3 mL via INTRAVENOUS

## 2019-09-13 MED FILL — VICTOZA 18 MG/3 ML INJECT P: 18 | 30 days supply | Qty: 9 | Fill #0

## 2019-09-30 ENCOUNTER — Other Ambulatory Visit: Payer: Self-pay

## 2019-09-30 ENCOUNTER — Ambulatory Visit: Payer: Medicaid Other | Attending: Internal Medicine | Admitting: Internal Medicine

## 2019-09-30 DIAGNOSIS — I10 Essential (primary) hypertension: Secondary | ICD-10-CM | POA: Diagnosis not present

## 2019-09-30 DIAGNOSIS — G4733 Obstructive sleep apnea (adult) (pediatric): Secondary | ICD-10-CM

## 2019-09-30 DIAGNOSIS — E1142 Type 2 diabetes mellitus with diabetic polyneuropathy: Secondary | ICD-10-CM | POA: Diagnosis not present

## 2019-09-30 DIAGNOSIS — E1169 Type 2 diabetes mellitus with other specified complication: Secondary | ICD-10-CM | POA: Diagnosis not present

## 2019-09-30 DIAGNOSIS — Z9989 Dependence on other enabling machines and devices: Secondary | ICD-10-CM

## 2019-09-30 DIAGNOSIS — E785 Hyperlipidemia, unspecified: Secondary | ICD-10-CM

## 2019-09-30 DIAGNOSIS — I5030 Unspecified diastolic (congestive) heart failure: Secondary | ICD-10-CM | POA: Diagnosis not present

## 2019-09-30 NOTE — Addendum Note (Signed)
Addended byMemory Dance on: 09/30/2019 10:22 AM   Modules accepted: Orders

## 2019-09-30 NOTE — Progress Notes (Signed)
Virtual Visit via Telephone Note Due to current restrictions/limitations of in-office visits due to the COVID-19 pandemic, this scheduled clinical appointment was converted to a telehealth visit  I connected with Paul Lamb on 09/30/19 at 8:42 a.m by telephone and verified that I am speaking with the correct person using two identifiers. I am in my office.  The patient is at home.  Only the patient and myself participated in this encounter.  I discussed the limitations, risks, security and privacy concerns of performing an evaluation and management service by telephone and the availability of in person appointments. I also discussed with the patient that there may be a patient responsible charge related to this service. The patient expressed understanding and agreed to proceed.   History of Present Illness: Patient with history ofHTN,diastolic CHF with EF [id number]%, HTN, OSA on CPAP, HL, morbid obesity, diabetes with neuropathy, depression.  Last seen 06/2019.  See in ER 09/01/19 with pain LT foot mainly the big toe. Dx with possible gout and given Indocin.  X-ray was negative.  No   DM:  Checking BS before meals in a.m and at bedtime.  Range before BF range is 90-100 and bedtime 120-130 mark. Last A1C was 8.2 -Med: Victozia added on last visit.  Compliant with oral meds.  Reports dec appetite.  Still feels he has room for improvement "trying to figure out whats good for me and what isn't." -Exercise:  "I still try to do my walking when I can and I do jumping jacks." Plans to get eye exam done within the next few wks  HYPERTENSION/CHF Currently taking: see medication list Med Adherence: [x]  Yes    []  No Medication side effects: []  Yes    []  No Adherence with salt restriction: [x]  Yes    []  No Home Monitoring?: [x]  Yes    []  No Monitoring Frequency: 2-3 x/day Home BP results range: 113/85 this a.m.  Checks 2-3 x a day SOB? []  Yes    [x]  No Chest Pain?: []  Yes    [x]  No Leg  swelling?: [x]  Yes but not more than usual    []  No Headaches?: []  Yes    [x]  No Dizziness? []  Yes    [x]  No Comments: Recent echo done 1/21 by Dr. Alvester Chou revealed normal LV function, grade 2 diastolic dysfunction with severe LVH  OSA:  Reports compliance with CPAP  HL compliant with Lipitor  Outpatient Encounter Medications as of 09/30/2019  Medication Sig  . amLODipine (NORVASC) 10 MG tablet Take 1 tablet (10 mg total) by mouth daily.  Marland Kitchen aspirin 81 MG EC tablet Take 1 tablet (81 mg total) by mouth daily.  Marland Kitchen atorvastatin (LIPITOR) 40 MG tablet Take 1 tablet (40 mg total) by mouth daily at 6 PM.  . carvedilol (COREG) 25 MG tablet Take 1 tablet (25 mg total) by mouth 2 (two) times daily with a meal.  . FARXIGA 5 MG TABS tablet TAKE 1 TABLET BY MOUTH ONCE A DAY  . furosemide (LASIX) 20 MG tablet Take 1 tablet (20 mg total) by mouth daily.  Marland Kitchen glipiZIDE (GLUCOTROL) 10 MG tablet Take 1 tablet (10 mg total) by mouth 2 (two) times daily before a meal.  . hydrALAZINE (APRESOLINE) 100 MG tablet Take 1 tablet (100 mg total) by mouth 3 (three) times daily.  . hydrochlorothiazide (HYDRODIURIL) 25 MG tablet Take 1 tablet (25 mg total) by mouth daily.  . Insulin Pen Needle (TRUEPLUS PEN NEEDLES) 32G X 4 MM MISC Use to  inject Victoza daily.  Marland Kitchen losartan (COZAAR) 100 MG tablet Take 1 tablet (100 mg total) by mouth daily.  . metFORMIN (GLUCOPHAGE) 1000 MG tablet Take 1 tablet (1,000 mg total) by mouth 2 (two) times daily with a meal.  . VIAGRA 100 MG tablet TAKE 1 TABLET BY MOUTH 30 MINUTES BEFORE INTERCOURSE AS NEEDED. LIMIT TO ONE TABLET PER DAY (Patient taking differently: Take 100 mg by mouth daily as needed for erectile dysfunction. )  . VICTOZA 18 MG/3ML SOPN INJECT 0.3 MLS (1.8 MG TOTAL) INTO THE SKIN DAILY.   No facility-administered encounter medications on file as of 09/30/2019.      Observations/Objective: Results for orders placed or performed in visit on 06/28/19  Basic Metabolic Panel   Result Value Ref Range   Glucose 87 65 - 99 mg/dL   BUN 15 6 - 20 mg/dL   Creatinine, Ser 6.62 0.76 - 1.27 mg/dL   GFR calc non Af Amer 92 >59 mL/min/1.73   GFR calc Af Amer 106 >59 mL/min/1.73   BUN/Creatinine Ratio 14 9 - 20   Sodium 140 134 - 144 mmol/L   Potassium 3.7 3.5 - 5.2 mmol/L   Chloride 99 96 - 106 mmol/L   CO2 26 20 - 29 mmol/L   Calcium 9.6 8.7 - 10.2 mg/dL  Glucose (CBG)  Result Value Ref Range   POC Glucose 115 (A) 70 - 99 mg/dl  HgB H4T  Result Value Ref Range   Hemoglobin A1C     HbA1c POC (<> result, manual entry)     HbA1c, POC (prediabetic range)     HbA1c, POC (controlled diabetic range) 8.2 (A) 0.0 - 7.0 %     Assessment and Plan: 1. Type 2 diabetes mellitus with diabetic polyneuropathy, without long-term current use of insulin (HCC) Commended him on medication compliance and trying to improve his eating habits.  He is agreeable to referral to nutritionist. Encourage regular exercise Encouraged him to get his eye exam done as soon as possible.  He declines having a schedule it for him stating that he will do it himself within the next few weeks. - Hemoglobin A1c; Future  2. Essential hypertension Diastolic blood pressure not at goal but close.  No changes made in medications.  Continue low-salt diet  3. Diastolic congestive heart failure, unspecified HF chronicity (HCC) By history he is clinically stable and compensated  4. Morbid obesity (HCC) - Amb ref to Medical Nutrition Therapy-MNT  5. Hyperlipidemia associated with type 2 diabetes mellitus (HCC) Continue atorvastatin  6. OSA on CPAP Continue compliance with CPAP   Follow Up Instructions: 3 mths   I discussed the assessment and treatment plan with the patient. The patient was provided an opportunity to ask questions and all were answered. The patient agreed with the plan and demonstrated an understanding of the instructions.   The patient was advised to call back or seek an in-person  evaluation if the symptoms worsen or if the condition fails to improve as anticipated.  I provided 12 minutes of non-face-to-face time during this encounter.   Paul Blue, MD

## 2019-10-01 LAB — HEMOGLOBIN A1C
Est. average glucose Bld gHb Est-mCnc: 154 mg/dL
Hgb A1c MFr Bld: 7 % — ABNORMAL HIGH (ref 4.8–5.6)

## 2019-10-03 ENCOUNTER — Encounter: Payer: Self-pay | Admitting: Internal Medicine

## 2019-10-03 MED ORDER — SILDENAFIL CITRATE 100 MG PO TABS: ORAL_TABLET | ORAL | 3 refills | Status: DC

## 2019-10-13 ENCOUNTER — Other Ambulatory Visit: Payer: Self-pay | Admitting: Internal Medicine

## 2019-10-18 ENCOUNTER — Other Ambulatory Visit: Payer: Self-pay | Admitting: Internal Medicine

## 2019-10-18 MED FILL — hydrALAZINE HCL 100 MG TABS: 100 | 30 days supply | Qty: 90 | Fill #1

## 2019-10-18 MED FILL — CARVEDILOL 25 MG TABLET: 25 | 30 days supply | Qty: 60 | Fill #2

## 2019-10-18 MED FILL — FARXIGA 5 MG TABLET: 5 | 30 days supply | Qty: 30 | Fill #0

## 2019-10-26 ENCOUNTER — Encounter: Payer: Self-pay | Admitting: Internal Medicine

## 2019-10-28 ENCOUNTER — Encounter: Payer: Self-pay | Admitting: Registered"

## 2019-10-28 ENCOUNTER — Other Ambulatory Visit: Payer: Self-pay

## 2019-10-28 ENCOUNTER — Encounter: Payer: Medicaid Other | Attending: Internal Medicine | Admitting: Registered"

## 2019-10-28 DIAGNOSIS — E669 Obesity, unspecified: Secondary | ICD-10-CM | POA: Diagnosis present

## 2019-10-28 MED FILL — VICTOZA 18 MG/3 ML INJECT P: 18 | 30 days supply | Qty: 9 | Fill #1

## 2019-10-28 NOTE — Progress Notes (Signed)
Diabetes Self-Management Education  Visit Type: First/Initial  Appt. Start Time: 0800 Appt. End Time: 2440  10/28/2019  Paul Lamb, identified by name and date of birth, is a 34 y.o. male with a diagnosis of Diabetes: Type 2.   Dietetic intern, Huston Foley, led the counseling portion for this appointment under the supervision of the registered dietitian.   ASSESSMENT  Height 6' (1.829 m), weight (!) 432 lb 4.8 oz (196.1 kg). Body mass index is 58.63 kg/m.   Pt reports having a family history of diabetes. Reports he has not received prior diabetes education. Pt checks blood sugar 2 times daily-fasting and after meals, however, reports checking about 3 or more hours after eating rather than 1-2 hours.   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 Reports he may try to get one installed but it will take a few weeks. Reports he tried having a cooler he could plug in but it kept burning a fuse.   Pt reports he tries to walk everyday, but when on the road he doesn't always feel safe walking outside his truck because it's often late when he stops.   Diabetes Self-Management Education - 10/28/19 0806      Visit Information   Visit Type  First/Initial      Initial Visit   Diabetes Type  Type 2    Are you currently following a meal plan?  No    Are you taking your medications as prescribed?  Yes      Health Coping   How would you rate your overall health?  Poor      Psychosocial Assessment   Patient Belief/Attitude about Diabetes  Afraid    How often do you need to have someone help you when you read instructions, pamphlets, or other written materials from your doctor or pharmacy?  1 - Never    What is the last grade level you completed in school?  2 years college      Complications   Last HgB A1C per patient/outside source  7 %    How often do you check your blood sugar?  1-2  times/day    Fasting Blood glucose range (mg/dL)  70-129    Postprandial Blood glucose range (mg/dL)  130-179    Have you had a dilated eye exam in the past 12 months?  No    Have you had a dental exam in the past 12 months?  No    Are you checking your feet?  Yes    How many days per week are you checking your feet?  7      Dietary Intake   Breakfast  skipped    Snack (morning)  no    Lunch  grilled chicken ramen noodles water    Snack (afternoon)  no    Dinner  salad ranch dressing with bacon bits water    Snack (evening)  no    Beverage(s)  water sometime green tea      Exercise   Exercise Type  Light (walking / raking leaves)    How many days per week to you exercise?  7    How many minutes per day do you exercise?  30    Total minutes per week of exercise  210      Patient Education   Previous Diabetes Education  No  Disease state   Definition of diabetes, type 1 and 2, and the diagnosis of diabetes    Nutrition management   Role of diet in the treatment of diabetes and the relationship between the three main macronutrients and blood glucose level;Carbohydrate counting;Reviewed blood glucose goals for pre and post meals and how to evaluate the patients' food intake on their blood glucose level.    Physical activity and exercise   Role of exercise on diabetes management, blood pressure control and cardiac health.    Monitoring  Purpose and frequency of SMBG.    Chronic complications  Reviewed with patient heart disease, higher risk of, and prevention    Psychosocial adjustment  Worked with patient to identify barriers to care and solutions;Helped patient identify a support system for diabetes management      Individualized Goals (developed by patient)   Nutrition  General guidelines for healthy choices and portions discussed    Physical Activity  Exercise 5-7 days per week;30 minutes per day    Medications  take my medication as prescribed    Monitoring   test my blood  glucose as discussed    Problem Solving  using cold-pack to keep food at right temperature, choosing foods that are not temperature sensitive      Post-Education Assessment   Patient understands the diabetes disease and treatment process.  Demonstrates understanding / competency    Patient understands incorporating nutritional management into lifestyle.  Demonstrates understanding / competency    Patient undertands incorporating physical activity into lifestyle.  Demonstrates understanding / competency    Patient understands using medications safely.  Demonstrates understanding / competency    Patient understands monitoring blood glucose, interpreting and using results  Demonstrates understanding / competency    Patient understands prevention, detection, and treatment of acute complications.  Needs Instruction    Patient understands prevention, detection, and treatment of chronic complications.  Needs Instruction    Patient understands how to develop strategies to address psychosocial issues.  Demonstrates understanding / competency    Patient understands how to develop strategies to promote health/change behavior.  Demonstrates understanding / competency      Outcomes   Expected Outcomes  Demonstrated interest in learning. Expect positive outcomes    Future DMSE  Other (comment)   3 weeks   Program Status  Not Completed       Individualized Plan for Diabetes Self-Management Training:   Learning Objective:  Patient will have a greater understanding of diabetes self-management. Patient education plan is to attend individual and/or group sessions per assessed needs and concerns.   Plan:  Provided education regarding importance of consistent carbohydrate intake, carbohydrate counting, pathophysiology of type 2 diabetes, and effects of exercise on blood sugar.   Instructions/Goals:  Eat 3 meals a day with 1-2 snacks most days of the week  Eat 3-4 carb choices per meal and 1-2 carb  choices per snack  Continue exercising most days of the week for 30 minutes  Continue to drink water and other low-sugar beverages  Continue to monitor blood glucose fasting and 2 hr post meal as much as possible   Patient Instructions  Eat 3 meals a day with 1-2 snacks most days of the week Eat 3-4 carb choices per meal and 1-2 carb choices per snack  Continue exercising most days of the week for 30 minutes  Continue to drink water and other low-sugar beverages  Continue to monitor blood glucose fasting and 2 hr post meal as much as  possible   Expected Outcomes:  Demonstrated interest in learning. Expect positive outcomes  Education material provided: ADA - How to Thrive: A Guide for Your Journey with Diabetes and Meal plan card  If problems or questions, patient to contact team via:  Phone and Email  Future DSME appointment: Other (comment)(3 weeks)

## 2019-10-28 NOTE — Patient Instructions (Signed)
Eat 3 meals a day with 1-2 snacks most days of the week Eat 3-4 carb choices per meal and 1-2 carb choices per snack  Continue exercising most days of the week for 30 minutes  Continue to drink water and other low-sugar beverages  Continue to monitor blood glucose fasting and 2 hr post meal as much as possible

## 2019-11-07 ENCOUNTER — Encounter: Payer: Self-pay | Admitting: Internal Medicine

## 2019-11-07 MED ORDER — COLCHICINE 0.6 MG PO TABS: ORAL_TABLET | ORAL | 0 refills | Status: DC

## 2019-11-11 MED FILL — metFORMIN HCL 1000 MG TABS: 1000 | 30 days supply | Qty: 60 | Fill #2

## 2019-11-18 ENCOUNTER — Ambulatory Visit: Payer: Medicaid Other | Admitting: Registered"

## 2019-11-21 MED FILL — AMLODIPINE BESYLATE 10 MG T: 10 | 90 days supply | Qty: 90 | Fill #2

## 2019-11-21 MED FILL — FUROSEMIDE 20 MG TABS: 20 | 90 days supply | Qty: 90 | Fill #2

## 2019-11-21 MED FILL — ATORVASTATIN CALCIUM 40 MG: 40 | 90 days supply | Qty: 90 | Fill #2

## 2019-11-21 MED FILL — HYDROCHLOROTHIAZIDE 25 MG T: 25 | 90 days supply | Qty: 90 | Fill #2

## 2019-11-21 MED FILL — LOSARTAN POTASSIUM 100 MG T: 100 | 90 days supply | Qty: 90 | Fill #2

## 2019-12-13 MED FILL — CARVEDILOL 25 MG TABLET: 25 | 30 days supply | Qty: 60 | Fill #3

## 2019-12-13 MED FILL — FARXIGA 5 MG TABLET: 5 | 30 days supply | Qty: 30 | Fill #1

## 2019-12-13 MED FILL — hydrALAZINE HCL 100 MG TABS: 100 | 30 days supply | Qty: 90 | Fill #2

## 2019-12-27 MED FILL — VICTOZA 18 MG/3 ML INJECT P: 18 | 17 days supply | Qty: 3 | Fill #1

## 2019-12-27 MED FILL — hydrALAZINE HCL 100 MG TABS: 100 | 30 days supply | Qty: 90 | Fill #2

## 2019-12-27 MED FILL — CARVEDILOL 25 MG TABLET: 25 | 30 days supply | Qty: 60 | Fill #3

## 2019-12-27 MED FILL — metFORMIN HCL 1000 MG TABS: 1000 | 30 days supply | Qty: 60 | Fill #3

## 2019-12-27 MED FILL — TRUEPLUS PEN NDL 32GX5/32: 32G X 4 MM | 30 days supply | Qty: 100 | Fill #1

## 2019-12-27 MED FILL — FARXIGA 5 MG TABLET: 5 | 30 days supply | Qty: 30 | Fill #1

## 2020-02-19 ENCOUNTER — Other Ambulatory Visit: Payer: Self-pay | Admitting: Internal Medicine

## 2020-02-19 DIAGNOSIS — I5030 Unspecified diastolic (congestive) heart failure: Secondary | ICD-10-CM

## 2020-02-19 DIAGNOSIS — I10 Essential (primary) hypertension: Secondary | ICD-10-CM

## 2020-02-19 MED FILL — CARVEDILOL 25 MG TABLET: 25 | 90 days supply | Qty: 180 | Fill #4

## 2020-02-19 MED FILL — FARXIGA 5 MG TABLET: 5 | 30 days supply | Qty: 30 | Fill #2

## 2020-02-19 MED FILL — ATORVASTATIN CALCIUM 40 MG: 40 | 90 days supply | Qty: 90 | Fill #2

## 2020-02-19 MED FILL — glipiZIDE 10 MG TABS: 10 | 90 days supply | Qty: 180 | Fill #2

## 2020-02-19 MED FILL — AMLODIPINE BESYLATE 10 MG T: 10 | 30 days supply | Qty: 30 | Fill #3

## 2020-02-19 MED FILL — HYDROCHLOROTHIAZIDE 25 MG T: 25 | 90 days supply | Qty: 90 | Fill #3

## 2020-02-19 MED FILL — FUROSEMIDE 20 MG TABS: 20 | 30 days supply | Qty: 30 | Fill #0

## 2020-02-19 MED FILL — LOSARTAN POTASSIUM 100 MG T: 100 | 90 days supply | Qty: 90 | Fill #3

## 2020-02-19 MED FILL — metFORMIN HCL 1000 MG TABS: 1000 | 90 days supply | Qty: 180 | Fill #4

## 2020-02-19 MED FILL — hydrALAZINE HCL 100 MG TABS: 100 | 30 days supply | Qty: 90 | Fill #3

## 2020-02-19 NOTE — Telephone Encounter (Signed)
Requested medication (s) are due for refill today:  Yes  Requested medication (s) are on the active medication list:  Yes  Future visit scheduled:  No  Last Refill: 06/28/19; #30; Refill x 6  Notes to clinic: attempted to contact the pt.; Rec'd message that "the subscriber you are trying to reach is not in service, and to try call again later."   Requested Prescriptions  Pending Prescriptions Disp Refills   furosemide (LASIX) 20 MG tablet [Pharmacy Med Name: FUROSEMIDE 20 MG TABS 20 Tablet] 90 tablet 6    Sig: Take 1 tablet (20 mg total) by mouth daily.      Cardiovascular:  Diuretics - Loop Failed - 02/19/2020 10:53 AM      Failed - Last BP in normal range    BP Readings from Last 1 Encounters:  09/01/19 (!) 141/95          Failed - Valid encounter within last 6 months    Recent Outpatient Visits           4 months ago Type 2 diabetes mellitus with diabetic polyneuropathy, without long-term current use of insulin (HCC)   [place] [facility] Marcine Matar, MD   7 months ago Essential hypertension   [facility] [place], Cornelius Moras, RPH-CPP   7 months ago Encounter for medication counseling   [facility] [place], Cornelius Moras, RPH-CPP   7 months ago Type 2 diabetes mellitus with diabetic polyneuropathy, without long-term current use of insulin (HCC)   [place] [facility] Marcine Matar, MD   1 year ago Diabetes mellitus type 2 in obese [facility])   [place] Marcine Matar, MD              Passed - K in normal range and within 360 days    Potassium  Date Value Ref Range Status  06/28/2019 3.7 3.5 - 5.2 mmol/L Final          Passed - Ca in normal range and within 360 days    Calcium  Date Value Ref Range Status  06/28/2019 9.6 8.7 - 10.2 mg/dL Final          Passed - Na in normal range and within 360 days    Sodium   Date Value Ref Range Status  06/28/2019 140 134 - 144 mmol/L Final          Passed - Cr in normal range and within 360 days    Creat  Date Value Ref Range Status  11/25/2015 1.05 0.60 - 1.35 mg/dL Final   Creatinine, Ser  Date Value Ref Range Status  06/28/2019 1.07 0.76 - 1.27 mg/dL Final

## 2020-02-20 ENCOUNTER — Telehealth: Payer: Self-pay | Admitting: Internal Medicine

## 2020-02-20 NOTE — Telephone Encounter (Signed)
Rep attempted to contact patient to schedule an office visit with pcp for further refills. Patients phone stated that the subscriber is not able to take calls at this time, please try again later. @ attempts were made.

## 2020-03-23 ENCOUNTER — Telehealth: Payer: Self-pay | Admitting: Cardiovascular Disease

## 2020-03-23 NOTE — Telephone Encounter (Signed)
Attempted to contact patient to schedule follow up visit with Allyson Sabal from recall list

## 2020-03-31 MED FILL — VICTOZA 2-PAK 18 MG/3 ML PE: 18 | 30 days supply | Qty: 6 | Fill #2

## 2020-04-01 ENCOUNTER — Other Ambulatory Visit: Payer: Self-pay | Admitting: Internal Medicine

## 2020-04-01 NOTE — Telephone Encounter (Signed)
Requested medication (s) are due for refill today: no  Requested medication (s) are on the active medication list: yes  Last refill:  02/19/2020  Future visit scheduled: no  Notes to clinic:  Attempted to contact patient but line keeps ringing busy Overdue for follow up   Requested Prescriptions  Pending Prescriptions Disp Refills   FARXIGA 5 MG TABS tablet [Pharmacy Med Name: FARXIGA 5 MG TABLET 5 Tablet] 30 tablet 2    Sig: TAKE [place]      Endocrinology:  Diabetes - SGLT2 Inhibitors Failed - 04/01/2020 10:48 AM      Failed - LDL in normal range and within 360 days    LDL Calculated  Date Value Ref Range Status  12/07/2018 74 0 - 99 mg/dL Final          Failed - HBA1C is between 0 and 7.9 and within 180 days    HbA1c, POC (controlled diabetic range)  Date Value Ref Range Status  06/28/2019 8.2 (A) 0.0 - 7.0 % Final   Hgb A1c MFr Bld  Date Value Ref Range Status  09/30/2019 7.0 (H) 4.8 - 5.6 % Final    Comment:             Prediabetes: 5.7 - 6.4          Diabetes: >6.4          Glycemic control for adults with diabetes: <7.0           Failed - Valid encounter within last 6 months    Recent Outpatient Visits           6 months ago Type 2 diabetes mellitus with diabetic polyneuropathy, without long-term current use of insulin ([place])   [place], Deborah B, MD   8 months ago Essential hypertension   [place], Jarome Matin, RPH-CPP   9 months ago Encounter for medication counseling   [place], Annie Main L, RPH-CPP   9 months ago Type 2 diabetes mellitus with diabetic polyneuropathy, without long-term current use of insulin ([place])   [place], Neoma Laming B, MD   1 year ago Diabetes mellitus type 2 in obese [facility])   [place], MD               Passed - Cr in normal range and within 360 days    Creat  Date Value Ref Range Status  11/25/2015 1.05 0.60 - 1.35 mg/dL Final   Creatinine, Ser  Date Value Ref Range Status  06/28/2019 1.07 0.76 - 1.27 mg/dL Final          Passed - eGFR in normal range and within 360 days    GFR calc Af Amer  Date Value Ref Range Status  06/28/2019 106 >59 mL/min/1.73 Final   GFR calc non Af Amer  Date Value Ref Range Status  06/28/2019 92 >59 mL/min/1.73 Final

## 2020-04-02 MED FILL — FARXIGA 5 MG TABLET: 5 | 30 days supply | Qty: 30 | Fill #0

## 2020-04-13 ENCOUNTER — Other Ambulatory Visit: Payer: Self-pay | Admitting: Cardiovascular Disease

## 2020-04-13 DIAGNOSIS — I10 Essential (primary) hypertension: Secondary | ICD-10-CM

## 2020-04-13 MED FILL — hydrALAZINE HCL 100 MG TABS: 100 | 30 days supply | Qty: 90 | Fill #4

## 2020-04-13 MED FILL — FARXIGA 5 MG TABLET: 5 | 30 days supply | Qty: 30 | Fill #0

## 2020-04-13 MED FILL — AMLODIPINE BESYLATE 10 MG T: 10 | 90 days supply | Qty: 90 | Fill #0

## 2020-05-08 ENCOUNTER — Other Ambulatory Visit: Payer: Self-pay | Admitting: Internal Medicine

## 2020-05-08 DIAGNOSIS — I10 Essential (primary) hypertension: Secondary | ICD-10-CM

## 2020-05-08 DIAGNOSIS — I5030 Unspecified diastolic (congestive) heart failure: Secondary | ICD-10-CM

## 2020-05-08 MED FILL — FUROSEMIDE 20 MG TABS: 20 | 30 days supply | Qty: 30 | Fill #0

## 2020-05-08 MED FILL — VICTOZA 2-PAK 18 MG/3 ML PE: 18 | 30 days supply | Qty: 6 | Fill #3

## 2020-05-13 MED FILL — FARXIGA 5 MG TABLET: 5 | 30 days supply | Qty: 30 | Fill #0

## 2020-06-12 ENCOUNTER — Encounter: Payer: Self-pay | Admitting: Internal Medicine

## 2020-06-12 ENCOUNTER — Ambulatory Visit: Payer: Medicaid Other | Attending: Internal Medicine | Admitting: Internal Medicine

## 2020-06-12 ENCOUNTER — Other Ambulatory Visit: Payer: Self-pay

## 2020-06-12 ENCOUNTER — Other Ambulatory Visit: Payer: Self-pay | Admitting: Internal Medicine

## 2020-06-12 DIAGNOSIS — G4733 Obstructive sleep apnea (adult) (pediatric): Secondary | ICD-10-CM | POA: Diagnosis not present

## 2020-06-12 DIAGNOSIS — E785 Hyperlipidemia, unspecified: Secondary | ICD-10-CM

## 2020-06-12 DIAGNOSIS — I10 Essential (primary) hypertension: Secondary | ICD-10-CM

## 2020-06-12 DIAGNOSIS — I5030 Unspecified diastolic (congestive) heart failure: Secondary | ICD-10-CM

## 2020-06-12 DIAGNOSIS — Z9989 Dependence on other enabling machines and devices: Secondary | ICD-10-CM

## 2020-06-12 DIAGNOSIS — Z2821 Immunization not carried out because of patient refusal: Secondary | ICD-10-CM

## 2020-06-12 DIAGNOSIS — E1169 Type 2 diabetes mellitus with other specified complication: Secondary | ICD-10-CM

## 2020-06-12 MED ORDER — TRUEPLUS PEN NEEDLES 32G X 4 MM MISC: 3 refills | Status: DC

## 2020-06-12 MED ORDER — DAPAGLIFLOZIN PROPANEDIOL 10 MG PO TABS: 10.0000 mg | ORAL_TABLET | Freq: Every day | ORAL | 2 refills | Status: DC

## 2020-06-12 MED ORDER — FUROSEMIDE 20 MG PO TABS: 20.0000 mg | ORAL_TABLET | Freq: Every day | ORAL | 2 refills | Status: DC

## 2020-06-12 MED ORDER — ATORVASTATIN CALCIUM 40 MG PO TABS: 40.0000 mg | ORAL_TABLET | Freq: Every day | ORAL | 2 refills | Status: DC

## 2020-06-12 MED ORDER — HYDROCHLOROTHIAZIDE 25 MG PO TABS: 25.0000 mg | ORAL_TABLET | Freq: Every day | ORAL | 11 refills | Status: DC

## 2020-06-12 MED ORDER — METFORMIN HCL 1000 MG PO TABS: 1000.0000 mg | ORAL_TABLET | Freq: Two times a day (BID) | ORAL | 2 refills | Status: DC

## 2020-06-12 MED ORDER — GLIPIZIDE 10 MG PO TABS: 10.0000 mg | ORAL_TABLET | Freq: Two times a day (BID) | ORAL | 2 refills | Status: DC

## 2020-06-12 MED ORDER — HYDRALAZINE HCL 100 MG PO TABS: 100.0000 mg | ORAL_TABLET | Freq: Three times a day (TID) | ORAL | 2 refills | Status: DC

## 2020-06-12 MED ORDER — CARVEDILOL 25 MG PO TABS: 25.0000 mg | ORAL_TABLET | Freq: Two times a day (BID) | ORAL | 2 refills | Status: DC

## 2020-06-12 MED ORDER — LOSARTAN POTASSIUM 100 MG PO TABS: 100.0000 mg | ORAL_TABLET | Freq: Every day | ORAL | 11 refills | Status: DC

## 2020-06-12 MED ORDER — VICTOZA 18 MG/3ML ~~LOC~~ SOPN: 1.8000 mg | PEN_INJECTOR | Freq: Every day | SUBCUTANEOUS | 2 refills | Status: DC

## 2020-06-12 MED ORDER — AMLODIPINE BESYLATE 10 MG PO TABS: 10.0000 mg | ORAL_TABLET | Freq: Every day | ORAL | 2 refills | Status: DC

## 2020-06-12 MED FILL — TRUEPLUS PEN NDL 32GX5/32: 32G X 4 MM | 30 days supply | Qty: 100 | Fill #2

## 2020-06-12 NOTE — Progress Notes (Signed)
[id number]-[id number] a1c-8.7

## 2020-06-12 NOTE — Progress Notes (Signed)
Patient ID: Paul Lamb, male    DOB: 1986-07-24  MRN: [id number]  CC: Diabetes and Hypertension   Subjective: Paul Lamb is a 33 y.o. male who presents for chronic disease management His concerns today include:  Patient with history ofHTN,diastolic CHF with EF of50-55%, HTN, OSA on CPAP, HL, morbid obesity, diabeteswith neuropathy, depression.  HYPERTENSION/CHF Currently taking: see medication list Med Adherence: compliant but ran out of Cozaar, HCTZ and Hydralazine ysterday Medication side effects: []  Yes    [x]  No Adherence with salt restriction: [x]  Yes    []  No Home Monitoring?: [x]  Yes    []  No Monitoring Frequency: BID Home BP results range: 140/90-105 SOB? []  Yes    [x]  No Chest Pain?: []  Yes    [x]  No Leg swelling?: [x]  Yes in ankles that go down at nights Headaches?: []  Yes    [x]  No Dizziness? []  Yes    [x]  No Comments:   DIABETES TYPE 2 Last A1C:   BS: 110 A1C 8.7  Med Adherence:  [x]  Yes    []  No Medication side effects:  []  Yes    [x]  No Home Monitoring?  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 []  Yes    [x]  No Retinopathy hx? []  Yes    []  No Last eye exam:  Reports having eye exam around 6/201 at [facility] on Paul Lamb Comments:   OSA:  Using his CPAP consistently.  Denies any daytime sleepiness.  He feels he benefits from using the CPAP machine.  HL:  compliant with atorvastatin but needs refill. Patient Active Problem List   Diagnosis Date Noted  . Diabetic polyneuropathy associated with type 2 diabetes mellitus (HCC) 02/23/2018  . Diabetes mellitus type 2 in obese (HCC) 12/29/2017  . Diastolic congestive heart failure (HCC) 11/28/2017  . Proteinuria   .  Erectile dysfunction 05/19/2017  . Depression 05/19/2017  . Essential hypertension 01/12/2016  . Sleep apnea 01/12/2016  . Abnormal EKG   . Hand pain, left 11/01/2015  . Morbid obesity (HCC) 11/01/2015     Current Outpatient Medications on File Prior to Visit  Medication Sig Dispense Refill  . aspirin 81 MG EC tablet Take 1 tablet (81 mg total) by mouth daily. 100 tablet 1  . colchicine 0.6 MG tablet Take 1 tab PO Q 8 hrs on day 1.  Then 1 tab PO daily x 3 days. 6 tablet 0  . sildenafil (VIAGRA) 100 MG tablet Take 1 tab 1/2-1 hr prior to intercourse PRN. 15 tablet 3   No current facility-administered medications on file prior to visit.    Allergies  Allergen Reactions  . Imdur [Isosorbide Dinitrate] Other (See Comments)    Headaches   . Penicillins Other (See Comments)    From childhood; reaction not known: Has patient had a PCN reaction causing immediate rash, facial/tongue/throat swelling, SOB or lightheadedness with hypotension: Unk Has patient had a PCN reaction causing severe rash involving mucus membranes or skin necrosis: Unk Has patient had a PCN reaction that required hospitalization: Unk Has patient had a PCN reaction occurring within the last 10 years: No If all of the above answers are "  NO", then may proceed with Cephalosporin use.     Social History   Socioeconomic History  . Marital status: Single    Spouse name: Not on file  . Number of children: Not on file  . Years of education: Not on file  . Highest education level: Not on file  Occupational History  . Not on file  Tobacco Use  . Smoking status: Never Smoker  . Smokeless tobacco: Never Used  Vaping Use  . Vaping Use: Never used  Substance and Sexual Activity  . Alcohol use: Yes    Alcohol/week: 0.0 standard drinks  . Drug use: No  . Sexual activity: Yes  Other Topics Concern  . Not on file  Social History Narrative  . Not on file   Social Determinants of Health   Financial Resource  Strain:   . Difficulty of Paying Living Expenses: Not on file  Food Insecurity:   . Worried About Programme researcher, broadcasting/film/video in the Last Year: Not on file  . Ran Out of Food in the Last Year: Not on file  Transportation Needs:   . Lack of Transportation (Medical): Not on file  . Lack of Transportation (Non-Medical): Not on file  Physical Activity:   . Days of Exercise per Week: Not on file  . Minutes of Exercise per Session: Not on file  Stress:   . Feeling of Stress : Not on file  Social Connections:   . Frequency of Communication with Friends and Family: Not on file  . Frequency of Social Gatherings with Friends and Family: Not on file  . Attends Religious Services: Not on file  . Active Member of Clubs or Organizations: Not on file  . Attends Banker Meetings: Not on file  . Marital Status: Not on file  Intimate Partner Violence:   . Fear of Current or Ex-Partner: Not on file  . Emotionally Abused: Not on file  . Physically Abused: Not on file  . Sexually Abused: Not on file    Family History  Problem Relation Age of Onset  . Hypertension Other   . Diabetes Mellitus II Other     Past Surgical History:  Procedure Laterality Date  . NO PAST SURGERIES      ROS: Review of Systems Negative except as stated above  PHYSICAL EXAM: BP 129/87   Pulse 91   Resp 16   Ht 6\' 2"  (1.88 m)   Wt (!) 403 lb (182.8 kg)   SpO2 96%   BMI 51.74 kg/m   Wt Readings from Last 3 Encounters:  06/12/20 (!) 403 lb (182.8 kg)  10/28/19 (!) 432 lb 4.8 oz (196.1 kg)  09/01/19 (!) 419 lb (190.1 kg)    Physical Exam General appearance - alert, well appearing, and in no distress Mental status - normal mood, behavior, speech, dress, motor activity, and thought processes Neck - supple, no significant adenopathy Chest - clear to auscultation, no wheezes, rales or rhonchi, symmetric air entry Heart - normal rate, regular rhythm, normal S1, S2, no murmurs, rubs, clicks or  gallops Extremities - peripheral pulses normal, no pedal edema, no clubbing or cyanosis Diabetic Foot Exam - Simple   Simple Foot Form Visual Inspection See comments: Yes Sensation Testing Intact to touch and monofilament testing bilaterally: Yes Pulse Check Posterior Tibialis and Dorsalis pulse intact bilaterally: Yes Comments Nonulcerative callus on the medial aspect of both big toes.     CMP Latest Ref Rng & Units 06/28/2019 03/17/2019 11/09/2018  Glucose 65 - 99 mg/dL 87 725(D) 94  BUN 6 - 20 mg/dL 15 15 12   Creatinine 0.76 - 1.27 mg/dL 6.64) 4.03(K  Sodium 134 - 144 mmol/L 140 130(L) 145(H)  Potassium 3.5 - 5.2 mmol/L 3.7 3.8 5.2  Chloride 96 - 106 mmol/L 99 93(L) 105  CO2 20 - 29 mmol/L 26 22 15(L)  Calcium 8.7 - 10.2 mg/dL 9.6 7.42 10.4(H)  Total Protein 6.5 - 8.1 g/dL - - -  Total Bilirubin 0.3 - 1.2 mg/dL - - -  Alkaline Phos 38 - 126 U/L - - -  AST 15 - 41 U/L - - -  ALT 0 - 44 U/L - - -   Lipid Panel     Component Value Date/Time   CHOL 137 12/07/2018 1613   TRIG 167 (H) 12/07/2018 1613   HDL 30 (L) 12/07/2018 1613   CHOLHDL 4.6 12/07/2018 1613   CHOLHDL 7.6 09/10/2017 0451   VLDL 29 09/10/2017 0451   LDLCALC 74 12/07/2018 1613    CBC    Component Value Date/Time   WBC 9.3 03/17/2019 1900   RBC 6.43 (H) 03/17/2019 1900   HGB 15.6 03/17/2019 1900   HGB 14.8 05/19/2017 1344   HCT 44.8 03/17/2019 1900   HCT 43.8 05/19/2017 1344   PLT 274 03/17/2019 1900   PLT 233 05/19/2017 1344   MCV 69.7 (L) 03/17/2019 1900   MCV 72 (L) 05/19/2017 1344   MCH 24.3 (L) 03/17/2019 1900   MCHC 34.8 03/17/2019 1900   RDW 13.3 03/17/2019 1900   RDW 16.3 (H) 05/19/2017 1344   LYMPHSABS 1.9 11/01/2015 1043   MONOABS 0.6 11/01/2015 1043   EOSABS 0.1 11/01/2015 1043   BASOSABS 0.1 11/01/2015 1043    ASSESSMENT AND PLAN: 1. Type 2 diabetes mellitus with morbid obesity (HCC) A1c not at goal. Commended him on 29 pounds weight loss since last visit.  Encouraged him to  keep up the good works. Encouraged him to avoid skipping meals so that he does not overeat at the next meal.  Encouraged him to continue regular exercise.  We will continue his current medications including Victoza, glipizide, Metformin and Farxiga.  Increase the dose of Farxiga to 10 mg daily. -Patient to sign a release today for 01/01/2016 to get the results of his vision exam from [facility]. - liraglutide (VICTOZA) 18 MG/3ML SOPN; Inject 1.8 mg into the skin daily.  Dispense: 12 mL; Refill: 2 - Insulin Pen Needle (TRUEPLUS PEN NEEDLES) 32G X 4 MM MISC; Use to inject Victoza daily.  Dispense: 100 each; Refill: 3 - dapagliflozin propanediol (FARXIGA) 10 MG TABS tablet; Take 1 tablet (10 mg total) by mouth daily.  Dispense: 90 tablet; Refill: 2 - glipiZIDE (GLUCOTROL) 10 MG tablet; Take 1 tablet (10 mg total) by mouth 2 (two) times daily before a meal.  Dispense: 180 tablet; Refill: 2 - metFORMIN (GLUCOPHAGE) 1000 MG tablet; Take 1 tablet (1,000 mg total) by mouth 2 (two) times daily with a meal.  Dispense: 180 tablet; Refill: 2 - Microalbumin / creatinine urine ratio - CBC - Comprehensive metabolic panel - Lipid panel - POCT glycosylated hemoglobin (Hb A1C) - POCT glucose (manual entry)  2. Essential hypertension Close to goal.  Continue current medications and low-salt diet - losartan (COZAAR) 100 MG tablet; Take 1 tablet (100 mg total) by mouth daily.  Dispense: 30 tablet; Refill: 11 - hydrochlorothiazide (HYDRODIURIL) 25 MG tablet; Take 1 tablet (25 mg total) by mouth daily.  Dispense: 30 tablet; Refill:  11 - hydrALAZINE (APRESOLINE) 100 MG tablet; Take 1 tablet (100 mg total) by mouth 3 (three) times daily.  Dispense: 270 tablet; Refill: 2 - furosemide (LASIX) 20 MG tablet; Take 1 tablet (20 mg total) by mouth daily.  Dispense: 90 tablet; Refill: 2 - carvedilol (COREG) 25 MG tablet; Take 1 tablet (25 mg total) by mouth 2 (two) times daily with a meal.  Dispense: 180 tablet; Refill: 2 - amLODipine  (NORVASC) 10 MG tablet; Take 1 tablet (10 mg total) by mouth daily.  Dispense: 90 tablet; Refill: 2  3. Diastolic congestive heart failure, unspecified HF chronicity (HCC) Compensated.  Continue current medications and furosemide - furosemide (LASIX) 20 MG tablet; Take 1 tablet (20 mg total) by mouth daily.  Dispense: 90 tablet; Refill: 2  4. Hyperlipidemia associated with type 2 diabetes mellitus (HCC) - atorvastatin (LIPITOR) 40 MG tablet; Take 1 tablet (40 mg total) by mouth daily at 6 PM.  Dispense: 90 tablet; Refill: 2  5. OSA on CPAP Continue CPAP.  6. Influenza vaccination declined This was offered.  Patient declined.     Requested Prescriptions   Signed Prescriptions Disp Refills  . liraglutide (VICTOZA) 18 MG/3ML SOPN 12 mL 2    Sig: Inject 1.8 mg into the skin daily.  Marland Kitchen losartan (COZAAR) 100 MG tablet 30 tablet 11    Sig: Take 1 tablet (100 mg total) by mouth daily.  . hydrochlorothiazide (HYDRODIURIL) 25 MG tablet 30 tablet 11    Sig: Take 1 tablet (25 mg total) by mouth daily.  . Insulin Pen Needle (TRUEPLUS PEN NEEDLES) 32G X 4 MM MISC 100 each 3    Sig: Use to inject Victoza daily.  . hydrALAZINE (APRESOLINE) 100 MG tablet 270 tablet 2    Sig: Take 1 tablet (100 mg total) by mouth 3 (three) times daily.  . furosemide (LASIX) 20 MG tablet 90 tablet 2    Sig: Take 1 tablet (20 mg total) by mouth daily.  . dapagliflozin propanediol (FARXIGA) 10 MG TABS tablet 90 tablet 2    Sig: Take 1 tablet (10 mg total) by mouth daily.  . carvedilol (COREG) 25 MG tablet 180 tablet 2    Sig: Take 1 tablet (25 mg total) by mouth 2 (two) times daily with a meal.  . atorvastatin (LIPITOR) 40 MG tablet 90 tablet 2    Sig: Take 1 tablet (40 mg total) by mouth daily at 6 PM.  . amLODipine (NORVASC) 10 MG tablet 90 tablet 2    Sig: Take 1 tablet (10 mg total) by mouth daily.  Marland Kitchen glipiZIDE (GLUCOTROL) 10 MG tablet 180 tablet 2    Sig: Take 1 tablet (10 mg total) by mouth 2 (two) times  daily before a meal.  . metFORMIN (GLUCOPHAGE) 1000 MG tablet 180 tablet 2    Sig: Take 1 tablet (1,000 mg total) by mouth 2 (two) times daily with a meal.    Return in about 4 months (around 10/13/2020) for Sign release for [place] to get records from [place].  [name], MD, FACP

## 2020-06-13 LAB — MICROALBUMIN / CREATININE URINE RATIO
Creatinine, Urine: 218.5 mg/dL
Microalb/Creat Ratio: 8 mg/g{creat} (ref 0–29)
Microalbumin, Urine: 17.9 ug/mL

## 2020-06-13 LAB — CBC
Hematocrit: 43.8 % (ref 37.5–51.0)
Hemoglobin: 15 g/dL (ref 13.0–17.7)
MCH: 24.3 pg — ABNORMAL LOW (ref 26.6–33.0)
MCHC: 34.2 g/dL (ref 31.5–35.7)
MCV: 71 fL — ABNORMAL LOW (ref 79–97)
Platelets: 279 10*3/uL (ref 150–450)
RBC: 6.17 x10E6/uL — ABNORMAL HIGH (ref 4.14–5.80)
RDW: 15.7 % — ABNORMAL HIGH (ref 11.6–15.4)
WBC: 8.4 10*3/uL (ref 3.4–10.8)

## 2020-06-13 LAB — COMPREHENSIVE METABOLIC PANEL
ALT: 21 IU/L (ref 0–44)
AST: 14 IU/L (ref 0–40)
Albumin/Globulin Ratio: 1.6 (ref 1.2–2.2)
Albumin: 4.5 g/dL (ref 4.0–5.0)
Alkaline Phosphatase: 99 IU/L (ref 44–121)
BUN/Creatinine Ratio: 14 (ref 9–20)
BUN: 16 mg/dL (ref 6–20)
Bilirubin Total: 0.9 mg/dL (ref 0.0–1.2)
CO2: 25 mmol/L (ref 20–29)
Calcium: 10.3 mg/dL — ABNORMAL HIGH (ref 8.7–10.2)
Chloride: 101 mmol/L (ref 96–106)
Creatinine, Ser: 1.14 mg/dL (ref 0.76–1.27)
GFR calc Af Amer: 98 mL/min/{1.73_m2} (ref >59)
GFR calc non Af Amer: 85 mL/min/{1.73_m2} (ref >59)
Globulin, Total: 2.8 g/dL (ref 1.5–4.5)
Glucose: 72 mg/dL (ref 65–99)
Potassium: 4 mmol/L (ref 3.5–5.2)
Sodium: 140 mmol/L (ref 134–144)
Total Protein: 7.3 g/dL (ref 6.0–8.5)

## 2020-06-13 LAB — LIPID PANEL
Chol/HDL Ratio: 5.4 ratio — ABNORMAL HIGH (ref 0.0–5.0)
Cholesterol, Total: 161 mg/dL (ref 100–199)
HDL: 30 mg/dL — ABNORMAL LOW
LDL Chol Calc (NIH): 91 mg/dL (ref 0–99)
Triglycerides: 234 mg/dL — ABNORMAL HIGH (ref 0–149)
VLDL Cholesterol Cal: 40 mg/dL (ref 5–40)

## 2020-06-15 LAB — GLUCOSE, POCT (MANUAL RESULT ENTRY): POC Glucose: 110 mg/dl — AB (ref 70–99)

## 2020-06-15 LAB — POCT GLYCOSYLATED HEMOGLOBIN (HGB A1C): HbA1c, POC (controlled diabetic range): 8.7 % — AB (ref 0.0–7.0)

## 2020-06-15 MED FILL — METFORMIN HCL 1000 MG TABS: 1000 | 90 days supply | Qty: 180 | Fill #0

## 2020-06-15 MED FILL — VICTOZA 18 MG/3 ML INJECT P: 18 | 30 days supply | Qty: 9 | Fill #0

## 2020-06-15 MED FILL — CARVEDILOL 25 MG TABLET: 25 | 90 days supply | Qty: 180 | Fill #0

## 2020-06-15 MED FILL — glipiZIDE 10 MG TABS: 10 | 90 days supply | Qty: 180 | Fill #0

## 2020-06-15 MED FILL — HYDROCHLOROTHIAZIDE 25 MG T: 25 | 30 days supply | Qty: 30 | Fill #0

## 2020-06-15 MED FILL — LOSARTAN POTASSIUM 100 MG T: 100 | 30 days supply | Qty: 30 | Fill #0

## 2020-06-15 MED FILL — hydrALAZINE HCL 100 MG TABS: 100 | 90 days supply | Qty: 270 | Fill #0

## 2020-06-15 MED FILL — FUROSEMIDE 20 MG TABS: 20 | 90 days supply | Qty: 90 | Fill #0

## 2020-06-15 MED FILL — ATORVASTATIN CALCIUM 40 MG: 40 | 90 days supply | Qty: 90 | Fill #0

## 2020-06-15 MED FILL — FARXIGA 10 MG TABLET: 10 | 90 days supply | Qty: 90 | Fill #0

## 2020-07-27 MED FILL — HYDROCHLOROTHIAZIDE 25 MG T: 25 | 90 days supply | Qty: 90 | Fill #1

## 2020-07-27 MED FILL — LOSARTAN POTASSIUM 100 MG T: 100 | 90 days supply | Qty: 90 | Fill #1

## 2020-07-27 MED FILL — AMLODIPINE BESYLATE 10 MG T: 10 | 90 days supply | Qty: 90 | Fill #0

## 2020-09-22 MED FILL — ATORVASTATIN CALCIUM 40 MG: 40 | 90 days supply | Qty: 90 | Fill #1

## 2020-09-22 MED FILL — FARXIGA 10 MG TABLET: 10 | 90 days supply | Qty: 90 | Fill #1

## 2020-10-28 MED FILL — FUROSEMIDE 20 MG TABS: 20 | 90 days supply | Qty: 90 | Fill #1

## 2020-11-11 MED FILL — AMLODIPINE BESYLATE 10 MG T: 10 | 90 days supply | Qty: 90 | Fill #1

## 2020-11-11 MED FILL — LOSARTAN POTASSIUM 100 MG T: 100 | 90 days supply | Qty: 90 | Fill #2

## 2020-11-11 MED FILL — HYDROCHLOROTHIAZIDE 25 MG T: 25 | 90 days supply | Qty: 90 | Fill #2

## 2020-11-29 MED FILL — Liraglutide Soln Pen-injector 18 MG/3ML (6 MG/ML): SUBCUTANEOUS | 30 days supply | Qty: 9 | Fill #0 | Status: CN

## 2020-11-30 ENCOUNTER — Other Ambulatory Visit: Payer: Self-pay

## 2020-12-07 ENCOUNTER — Other Ambulatory Visit: Payer: Self-pay

## 2020-12-07 MED FILL — Liraglutide Soln Pen-injector 18 MG/3ML (6 MG/ML): SUBCUTANEOUS | 30 days supply | Qty: 9 | Fill #0 | Status: AC

## 2020-12-08 ENCOUNTER — Other Ambulatory Visit: Payer: Self-pay

## 2020-12-08 MED FILL — Hydralazine HCl Tab 100 MG: ORAL | 90 days supply | Qty: 270 | Fill #0 | Status: AC

## 2020-12-08 MED FILL — Metformin HCl Tab 1000 MG: ORAL | 90 days supply | Qty: 180 | Fill #0 | Status: AC

## 2020-12-16 ENCOUNTER — Other Ambulatory Visit: Payer: Self-pay

## 2020-12-16 ENCOUNTER — Encounter: Payer: Self-pay | Admitting: Physician Assistant

## 2020-12-16 ENCOUNTER — Ambulatory Visit: Payer: Medicaid Other | Attending: Physician Assistant | Admitting: Physician Assistant

## 2020-12-16 DIAGNOSIS — E785 Hyperlipidemia, unspecified: Secondary | ICD-10-CM | POA: Diagnosis not present

## 2020-12-16 DIAGNOSIS — E1169 Type 2 diabetes mellitus with other specified complication: Secondary | ICD-10-CM | POA: Diagnosis not present

## 2020-12-16 MED ORDER — DAPAGLIFLOZIN PROPANEDIOL 10 MG PO TABS
ORAL_TABLET | Freq: Every day | ORAL | 2 refills | Status: DC
  Filled 2020-12-16 – 2020-12-28 (×2): qty 90, 90d supply, fill #0
  Filled 2021-04-12: qty 90, 90d supply, fill #1

## 2020-12-16 MED ORDER — ATORVASTATIN CALCIUM 40 MG PO TABS
ORAL_TABLET | Freq: Every day | ORAL | 2 refills | Status: DC
  Filled 2020-12-16 – 2020-12-28 (×2): qty 90, 90d supply, fill #0
  Filled 2021-04-12: qty 90, 90d supply, fill #1

## 2020-12-16 NOTE — Progress Notes (Signed)
Virtual Visit via Telephone Note  I connected with Paul Lamb on 12/16/20 at  3:30 PM EDT by telephone and verified that I am speaking with the correct person using two identifiers.  Location: Patient: home Provider: [facility] office   I discussed the limitations, risks, security and privacy concerns of performing an evaluation and management service by telephone and the availability of in person appointments. I also discussed with the patient that there may be a patient responsible charge related to this service. The patient expressed understanding and agreed to proceed.   History of Present Illness:  [facility] earlier this week.  It was 8.4.  Last checked in our office 05/2020=8.7.  He admits to poor diet and drinking a lot of sweet tea.  He says his blood sugars are bt 140-180 when he checks them.  No polyuria/polydipsia    Observations/Objective: NAD.  A&Ox3   Assessment and Plan: 1. Type 2 diabetes mellitus with morbid obesity (HCC) He is on max victoza, metformin, and glipizide.  Work hard on diabetic diet, check blood sugars bid(at least) and record and bring to visit with Franky Macho in 3-4 months.  Med changes if needed then - Comprehensive metabolic panel; Future - Lipid panel; Future - CBC with Differential/Platelet; Future - dapagliflozin propanediol (FARXIGA) 10 MG TABS tablet; TAKE 1 TABLET (10 MG TOTAL) BY MOUTH DAILY.  Dispense: 90 tablet; Refill: 2  2. Hyperlipidemia associated with type 2 diabetes mellitus (HCC) - atorvastatin (LIPITOR) 40 MG tablet; TAKE 1 TABLET (40 MG TOTAL) BY MOUTH DAILY AT 6 PM.  Dispense: 90 tablet; Refill: 2    Follow Up Instructions: appt with Franky Macho in 3-4 weeks for DM/med changes/titration and PCP in 4 months   I discussed the assessment and treatment plan with the patient. The patient was provided an opportunity to ask questions and all were answered. The patient agreed with the plan and demonstrated an  understanding of the instructions.   The patient was advised to call back or seek an in-person evaluation if the symptoms worsen or if the condition fails to improve as anticipated.  I provided 14 minutes of non-face-to-face time during this encounter.   Georgian Co, PA-C  Patient ID: Paul Lamb, male   DOB: 1986/07/05, 35 y.o.   MRN: [id number]

## 2020-12-17 ENCOUNTER — Other Ambulatory Visit: Payer: Self-pay

## 2020-12-22 DIAGNOSIS — I5032 Chronic diastolic (congestive) heart failure: Secondary | ICD-10-CM

## 2020-12-24 ENCOUNTER — Other Ambulatory Visit: Payer: Self-pay

## 2020-12-28 ENCOUNTER — Other Ambulatory Visit: Payer: Self-pay

## 2021-01-07 ENCOUNTER — Encounter: Payer: Self-pay | Admitting: Internal Medicine

## 2021-01-07 ENCOUNTER — Other Ambulatory Visit: Payer: Self-pay

## 2021-01-07 MED ORDER — SILDENAFIL CITRATE 100 MG PO TABS: ORAL_TABLET | ORAL | 3 refills | Status: DC

## 2021-01-13 ENCOUNTER — Ambulatory Visit: Payer: Medicaid Other | Admitting: Pharmacist

## 2021-01-25 ENCOUNTER — Ambulatory Visit (HOSPITAL_COMMUNITY): Payer: Medicaid Other | Attending: Cardiology

## 2021-01-25 ENCOUNTER — Other Ambulatory Visit: Payer: Self-pay

## 2021-01-25 DIAGNOSIS — I5032 Chronic diastolic (congestive) heart failure: Secondary | ICD-10-CM | POA: Diagnosis not present

## 2021-01-25 LAB — ECHOCARDIOGRAM COMPLETE: Area-P 1/2: 3.08 cm2

## 2021-01-25 MED ORDER — PERFLUTREN LIPID MICROSPHERE
1.0000 mL | INTRAVENOUS | Status: AC | PRN
  Administered 2021-01-25: 2 mL via INTRAVENOUS

## 2021-01-26 ENCOUNTER — Encounter: Payer: Self-pay | Admitting: Internal Medicine

## 2021-02-08 ENCOUNTER — Other Ambulatory Visit: Payer: Self-pay

## 2021-02-08 MED FILL — Furosemide Tab 20 MG: ORAL | 90 days supply | Qty: 90 | Fill #0 | Status: AC

## 2021-02-08 MED FILL — Glipizide Tab 10 MG: ORAL | 90 days supply | Qty: 180 | Fill #0 | Status: AC

## 2021-02-19 ENCOUNTER — Other Ambulatory Visit: Payer: Self-pay

## 2021-02-19 ENCOUNTER — Other Ambulatory Visit: Payer: Self-pay | Admitting: Internal Medicine

## 2021-02-19 MED ORDER — BD PEN NEEDLE NANO U/F 32G X 4 MM MISC
3 refills | Status: DC
  Filled 2021-02-19: qty 100, 90d supply, fill #0
  Filled 2021-02-26: qty 100, 30d supply, fill #0

## 2021-02-19 MED FILL — Amlodipine Besylate Tab 10 MG (Base Equivalent): ORAL | 90 days supply | Qty: 90 | Fill #0 | Status: CN

## 2021-02-19 MED FILL — Hydrochlorothiazide Tab 25 MG: ORAL | 90 days supply | Qty: 90 | Fill #0 | Status: CN

## 2021-02-19 MED FILL — Liraglutide Soln Pen-injector 18 MG/3ML (6 MG/ML): SUBCUTANEOUS | 30 days supply | Qty: 9 | Fill #1 | Status: CN

## 2021-02-19 MED FILL — Losartan Potassium Tab 100 MG: ORAL | 90 days supply | Qty: 90 | Fill #0 | Status: CN

## 2021-02-19 MED FILL — Carvedilol Tab 25 MG: ORAL | 90 days supply | Qty: 180 | Fill #0 | Status: CN

## 2021-02-26 ENCOUNTER — Other Ambulatory Visit: Payer: Self-pay

## 2021-02-26 MED FILL — Amlodipine Besylate Tab 10 MG (Base Equivalent): ORAL | 90 days supply | Qty: 90 | Fill #0 | Status: AC

## 2021-02-26 MED FILL — Liraglutide Soln Pen-injector 18 MG/3ML (6 MG/ML): SUBCUTANEOUS | 60 days supply | Qty: 18 | Fill #1 | Status: AC

## 2021-02-26 MED FILL — Losartan Potassium Tab 100 MG: ORAL | 90 days supply | Qty: 90 | Fill #0 | Status: AC

## 2021-02-26 MED FILL — Hydrochlorothiazide Tab 25 MG: ORAL | 90 days supply | Qty: 90 | Fill #0 | Status: AC

## 2021-02-26 MED FILL — Carvedilol Tab 25 MG: ORAL | 90 days supply | Qty: 180 | Fill #0 | Status: AC

## 2021-03-03 ENCOUNTER — Other Ambulatory Visit: Payer: Self-pay

## 2021-04-12 ENCOUNTER — Other Ambulatory Visit: Payer: Self-pay

## 2021-04-15 ENCOUNTER — Ambulatory Visit: Payer: Medicaid Other | Admitting: Internal Medicine

## 2021-04-18 ENCOUNTER — Other Ambulatory Visit: Payer: Self-pay | Admitting: Internal Medicine

## 2021-04-18 DIAGNOSIS — I10 Essential (primary) hypertension: Secondary | ICD-10-CM

## 2021-04-18 DIAGNOSIS — I5030 Unspecified diastolic (congestive) heart failure: Secondary | ICD-10-CM

## 2021-04-18 MED FILL — Metformin HCl Tab 1000 MG: ORAL | 90 days supply | Qty: 180 | Fill #1 | Status: CN

## 2021-04-19 ENCOUNTER — Other Ambulatory Visit: Payer: Self-pay

## 2021-04-19 MED ORDER — FUROSEMIDE 20 MG PO TABS
ORAL_TABLET | Freq: Every day | ORAL | 0 refills | Status: DC
  Filled 2021-04-19: qty 90, 90d supply, fill #0

## 2021-05-03 ENCOUNTER — Other Ambulatory Visit: Payer: Self-pay

## 2021-05-10 ENCOUNTER — Other Ambulatory Visit: Payer: Self-pay

## 2021-05-10 ENCOUNTER — Ambulatory Visit: Payer: Medicaid Other | Attending: Internal Medicine | Admitting: Internal Medicine

## 2021-05-10 DIAGNOSIS — I10 Essential (primary) hypertension: Secondary | ICD-10-CM

## 2021-05-10 DIAGNOSIS — I5030 Unspecified diastolic (congestive) heart failure: Secondary | ICD-10-CM | POA: Diagnosis not present

## 2021-05-10 DIAGNOSIS — E1169 Type 2 diabetes mellitus with other specified complication: Secondary | ICD-10-CM

## 2021-05-10 DIAGNOSIS — E785 Hyperlipidemia, unspecified: Secondary | ICD-10-CM

## 2021-05-10 DIAGNOSIS — G4733 Obstructive sleep apnea (adult) (pediatric): Secondary | ICD-10-CM | POA: Diagnosis not present

## 2021-05-10 DIAGNOSIS — Z9989 Dependence on other enabling machines and devices: Secondary | ICD-10-CM

## 2021-05-10 MED ORDER — LIRAGLUTIDE 18 MG/3ML ~~LOC~~ SOPN
1.8000 mg | PEN_INJECTOR | Freq: Every day | SUBCUTANEOUS | 4 refills | Status: DC
  Filled 2021-05-10: qty 9, 30d supply, fill #0
  Filled 2021-06-11: qty 12, 40d supply, fill #0
  Filled 2021-10-25: qty 9, 30d supply, fill #0
  Filled 2021-10-25: qty 12, 40d supply, fill #1

## 2021-05-10 MED ORDER — HYDRALAZINE HCL 100 MG PO TABS
ORAL_TABLET | Freq: Three times a day (TID) | ORAL | 2 refills | Status: DC
  Filled 2021-05-10 – 2021-09-13 (×3): qty 270, 90d supply, fill #0
  Filled 2022-01-26: qty 270, 90d supply, fill #1

## 2021-05-10 MED ORDER — FUROSEMIDE 20 MG PO TABS
ORAL_TABLET | Freq: Every day | ORAL | 2 refills | Status: DC
  Filled 2021-05-10 – 2021-09-13 (×3): qty 90, 90d supply, fill #0
  Filled 2021-12-19: qty 90, 90d supply, fill #1

## 2021-05-10 MED ORDER — BD PEN NEEDLE NANO U/F 32G X 4 MM MISC
3 refills | Status: DC
  Filled 2021-05-10: qty 100, 30d supply, fill #0
  Filled 2021-07-11: qty 100, 25d supply, fill #0
  Filled 2021-10-14: qty 100, 25d supply, fill #1
  Filled 2021-10-14 – 2021-10-26 (×2): qty 100, 25d supply, fill #0
  Filled 2022-03-17 – 2022-04-01 (×2): qty 100, 25d supply, fill #1

## 2021-05-10 MED ORDER — AMLODIPINE BESYLATE 10 MG PO TABS
ORAL_TABLET | Freq: Every day | ORAL | 2 refills | Status: DC
  Filled 2021-05-10: qty 90, fill #0
  Filled 2021-06-11 – 2021-09-13 (×2): qty 90, 90d supply, fill #0
  Filled 2021-12-19: qty 90, 90d supply, fill #1

## 2021-05-10 MED ORDER — HYDROCHLOROTHIAZIDE 25 MG PO TABS
ORAL_TABLET | Freq: Every day | ORAL | 11 refills | Status: DC
  Filled 2021-05-10: qty 30, fill #0
  Filled 2021-06-11 – 2021-09-13 (×2): qty 90, 90d supply, fill #0
  Filled 2021-12-19: qty 90, 90d supply, fill #1
  Filled 2022-03-17 – 2022-03-25 (×2): qty 90, 90d supply, fill #2

## 2021-05-10 MED ORDER — DAPAGLIFLOZIN PROPANEDIOL 10 MG PO TABS
ORAL_TABLET | Freq: Every day | ORAL | 2 refills | Status: DC
  Filled 2021-05-10: qty 90, fill #0
  Filled 2021-06-11: qty 90, 90d supply, fill #0
  Filled 2021-08-23: qty 90, 90d supply, fill #1
  Filled 2021-11-29: qty 90, 90d supply, fill #0

## 2021-05-10 MED ORDER — LANTUS SOLOSTAR 100 UNIT/ML ~~LOC~~ SOPN
12.0000 [IU] | PEN_INJECTOR | Freq: Every day | SUBCUTANEOUS | 99 refills | Status: DC
  Filled 2021-05-10: qty 3, 25d supply, fill #0
  Filled 2021-06-11: qty 3, 25d supply, fill #1
  Filled 2021-07-11: qty 3, 25d supply, fill #2
  Filled 2021-08-23: qty 3, 25d supply, fill #3

## 2021-05-10 MED ORDER — METFORMIN HCL 1000 MG PO TABS
ORAL_TABLET | Freq: Two times a day (BID) | ORAL | 2 refills | Status: DC
  Filled 2021-05-10 – 2021-09-13 (×3): qty 180, 90d supply, fill #0
  Filled 2022-03-17 – 2022-03-25 (×2): qty 180, 90d supply, fill #1

## 2021-05-10 MED ORDER — GLIPIZIDE 10 MG PO TABS
ORAL_TABLET | Freq: Two times a day (BID) | ORAL | 2 refills | Status: DC
  Filled 2021-05-10 – 2021-06-11 (×2): qty 180, 90d supply, fill #0
  Filled 2021-08-23: qty 180, 90d supply, fill #1

## 2021-05-10 MED ORDER — CARVEDILOL 25 MG PO TABS
ORAL_TABLET | Freq: Two times a day (BID) | ORAL | 2 refills | Status: DC
  Filled 2021-05-10: qty 180, fill #0
  Filled 2021-06-11 – 2021-09-13 (×2): qty 180, 90d supply, fill #0
  Filled 2022-03-17: qty 180, 90d supply, fill #1

## 2021-05-10 MED ORDER — ATORVASTATIN CALCIUM 40 MG PO TABS
ORAL_TABLET | Freq: Every day | ORAL | 2 refills | Status: DC
  Filled 2021-05-10: qty 90, fill #0
  Filled 2021-06-11 – 2021-09-13 (×2): qty 90, 90d supply, fill #0
  Filled 2021-12-19: qty 90, 90d supply, fill #1

## 2021-05-10 MED ORDER — LOSARTAN POTASSIUM 100 MG PO TABS
ORAL_TABLET | Freq: Every day | ORAL | 2 refills | Status: DC
  Filled 2021-05-10: qty 90, fill #0
  Filled 2021-06-11 – 2021-09-13 (×2): qty 90, 90d supply, fill #0
  Filled 2021-12-19: qty 90, 90d supply, fill #1

## 2021-05-10 NOTE — Progress Notes (Signed)
Patient ID: Paul Lamb, male   DOB: 1986-02-27, 35 y.o.   MRN: [id number] Virtual Visit via Telephone Note  I connected with Paul Lamb on 05/10/2021 at 2:48 PM by telephone and verified that I am speaking with the correct person using two identifiers  Location: Patient: home Provider: office  Participants: Myself Patient   I discussed the limitations, risks, security and privacy concerns of performing an evaluation and management service by telephone and the availability of in person appointments. I also discussed with the patient that there may be a patient responsible charge related to this service. The patient expressed understanding and agreed to proceed.   History of Present Illness: Patient with history of HTN, diastolic CHF with EF of 50-55%, HTN, OSA on CPAP, HL, morbid obesity, diabetes with neuropathy, depression.  Last saw me 05/2020.  DM:  reports BS 220-240 range.  In the 300s a few times when he drank sodas. Checks BS before BF and evening before dinner or at bedtime.  #s have been in the 200.   Last wgh was 369 lbs a few wks ago Had A1C done 11/2020 at [facility] 8.4 Reports compliance with Victozia, Metformin, Glucotrol and Farxgia -Usually eats 2 meals a daily, rarely 3 -has eye appt in a few wks at [facility].    HYPERTENSION/Diastolic CHF Currently taking: see medication list Med Adherence: [x]  Yes -Cozaar, HCTZ, Hydralazine and Furosemide Medication side effects: []  Yes    [x]  No Adherence with salt restriction: [x]  Yes    []  No Home Monitoring?: [x]  Yes    []  No Monitoring Frequency: 122-130/77-90 Home BP results range:  SOB? []  Yes    [x]  No Chest Pain?: []  Yes    [x]  No Leg swelling?: []  Yes    [x]  No Headaches?: []  Yes    [x]  No Dizziness? []  Yes    [x]  No Comments:   HL:  tolerating Lipitor  OSA: Reports he is using his CPAP consistently with good results.  HM: Had 2 COVID vaccines.  Due for booster.   Outpatient Encounter  Medications as of 05/10/2021  Medication Sig   amLODipine (NORVASC) 10 MG tablet TAKE 1 TABLET (10 MG TOTAL) BY MOUTH DAILY.   aspirin 81 MG EC tablet Take 1 tablet (81 mg total) by mouth daily.   atorvastatin (LIPITOR) 40 MG tablet TAKE 1 TABLET (40 MG TOTAL) BY MOUTH DAILY AT 6 PM.   carvedilol (COREG) 25 MG tablet TAKE 1 TABLET (25 MG TOTAL) BY MOUTH 2 (TWO) TIMES DAILY WITH A MEAL.   colchicine 0.6 MG tablet Take 1 tab PO Q 8 hrs on day 1.  Then 1 tab PO daily x 3 days.   dapagliflozin propanediol (FARXIGA) 10 MG TABS tablet TAKE 1 TABLET (10 MG TOTAL) BY MOUTH DAILY.   furosemide (LASIX) 20 MG tablet TAKE 1 TABLET (20 MG TOTAL) BY MOUTH DAILY.   glipiZIDE (GLUCOTROL) 10 MG tablet TAKE 1 TABLET (10 MG TOTAL) BY MOUTH 2 (TWO) TIMES DAILY BEFORE A MEAL.   hydrALAZINE (APRESOLINE) 100 MG tablet TAKE 1 TABLET (100 MG TOTAL) BY MOUTH 3 (THREE) TIMES DAILY.   hydrochlorothiazide (HYDRODIURIL) 25 MG tablet TAKE 1 TABLET (25 MG TOTAL) BY MOUTH DAILY.   Insulin Pen Needle (BD PEN NEEDLE NANO U/F) 32G X 4 MM MISC USE TO INJECT VICTOZA DAILY   liraglutide (VICTOZA) 18 MG/3ML SOPN INJECT 1.8 MG INTO THE SKIN DAILY.   losartan (COZAAR) 100 MG tablet TAKE 1 TABLET (100  MG TOTAL) BY MOUTH DAILY.   metFORMIN (GLUCOPHAGE) 1000 MG tablet TAKE 1 TABLET (1,000 MG TOTAL) BY MOUTH 2 (TWO) TIMES DAILY WITH A MEAL.   sildenafil (VIAGRA) 100 MG tablet Take 1 tab 1/2-1 hr prior to intercourse PRN.   No facility-administered encounter medications on file as of 05/10/2021.      Observations/Objective:   Chemistry      Component Value Date/Time   NA 140 06/12/2020 1650   K 4.0 06/12/2020 1650   CL 101 06/12/2020 1650   CO2 25 06/12/2020 1650   BUN 16 06/12/2020 1650   CREATININE 1.14 06/12/2020 1650   CREATININE 1.05 11/25/2015 1324      Component Value Date/Time   CALCIUM 10.3 (H) 06/12/2020 1650   ALKPHOS 99 06/12/2020 1650   AST 14 06/12/2020 1650   ALT 21 06/12/2020 1650   BILITOT 0.9 06/12/2020 1650      Lab Results  Component Value Date   WBC 8.4 06/12/2020   HGB 15.0 06/12/2020   HCT 43.8 06/12/2020   MCV 71 (L) 06/12/2020   PLT 279 06/12/2020   Lab Results  Component Value Date   CHOL 161 06/12/2020   HDL 30 (L) 06/12/2020   LDLCALC 91 06/12/2020   TRIG 234 (H) 06/12/2020   CHOLHDL 5.4 (H) 06/12/2020     Assessment and Plan: 1. Type 2 diabetes mellitus with morbid obesity (HCC) Reported blood sugars not at goal.  He will continue his current medications.  We discussed adding Lantus 12 units at bedtime.  If after 3 days, his morning blood sugars are still higher than 140, he should increase the Lantus insulin to 16 units daily.  He is agreeable to this.  Encourage healthy eating habits and avoid sugary drinks.  Follow-up with the clinical pharmacist in 3 weeks with blood sugar log. - CBC; Future - Comprehensive metabolic panel; Future - Lipid panel; Future - Microalbumin / creatinine urine ratio; Future - insulin glargine (LANTUS SOLOSTAR) 100 UNIT/ML Solostar Pen; Inject 12 Units into the skin at bedtime.  Dispense: 15 mL; Refill: PRN - Insulin Pen Needle (BD PEN NEEDLE NANO U/F) 32G X 4 MM MISC; USE TO INJECT VICTOZA DAILY  Dispense: 100 each; Refill: 3 - liraglutide (VICTOZA) 18 MG/3ML SOPN; INJECT 1.8 MG INTO THE SKIN DAILY.  Dispense: 12 mL; Refill: 4 - glipiZIDE (GLUCOTROL) 10 MG tablet; TAKE 1 TABLET (10 MG TOTAL) BY MOUTH 2 (TWO) TIMES DAILY BEFORE A MEAL.  Dispense: 180 tablet; Refill: 2 - metFORMIN (GLUCOPHAGE) 1000 MG tablet; TAKE 1 TABLET (1,000 MG TOTAL) BY MOUTH 2 (TWO) TIMES DAILY WITH A MEAL.  Dispense: 180 tablet; Refill: 2 - dapagliflozin propanediol (FARXIGA) 10 MG TABS tablet; TAKE 1 TABLET (10 MG TOTAL) BY MOUTH DAILY.  Dispense: 90 tablet; Refill: 2 - Hemoglobin A1c; Future - Hemoglobin A1c - Microalbumin / creatinine urine ratio - Lipid panel - Comprehensive metabolic panel - CBC  2. Essential hypertension Diastolic blood pressure still somewhat  above goal but acceptable.  Continue current medications and low-salt diet. - carvedilol (COREG) 25 MG tablet; TAKE 1 TABLET (25 MG TOTAL) BY MOUTH 2 (TWO) TIMES DAILY WITH A MEAL.  Dispense: 180 tablet; Refill: 2 - hydrALAZINE (APRESOLINE) 100 MG tablet; TAKE 1 TABLET (100 MG TOTAL) BY MOUTH 3 (THREE) TIMES DAILY.  Dispense: 270 tablet; Refill: 2 - amLODipine (NORVASC) 10 MG tablet; TAKE 1 TABLET (10 MG TOTAL) BY MOUTH DAILY.  Dispense: 90 tablet; Refill: 2 - furosemide (LASIX) 20 MG tablet; TAKE  1 TABLET (20 MG TOTAL) BY MOUTH DAILY.  Dispense: 90 tablet; Refill: 2 - hydrochlorothiazide (HYDRODIURIL) 25 MG tablet; TAKE 1 TABLET (25 MG TOTAL) BY MOUTH DAILY.  Dispense: 30 tablet; Refill: 11 - losartan (COZAAR) 100 MG tablet; TAKE 1 TABLET (100 MG TOTAL) BY MOUTH DAILY.  Dispense: 90 tablet; Refill: 2  3. Hyperlipidemia associated with type 2 diabetes mellitus (HCC) - atorvastatin (LIPITOR) 40 MG tablet; TAKE 1 TABLET (40 MG TOTAL) BY MOUTH DAILY AT 6 PM.  Dispense: 90 tablet; Refill: 2  4. Diastolic congestive heart failure, unspecified HF chronicity (HCC) Stable and compensated. - furosemide (LASIX) 20 MG tablet; TAKE 1 TABLET (20 MG TOTAL) BY MOUTH DAILY.  Dispense: 90 tablet; Refill: 2  5. OSA on CPAP Continue daily use of CPAP.  He is stable and benefits from its use.   Follow Up Instructions: 4 mths Give appt with clinical pharmacist in 3 wks for recheck DM and to get flu shot   I discussed the assessment and treatment plan with the patient. The patient was provided an opportunity to ask questions and all were answered. The patient agreed with the plan and demonstrated an understanding of the instructions.   The patient was advised to call back or seek an in-person evaluation if the symptoms worsen or if the condition fails to improve as anticipated.  I  Spent 18 minutes on this telephone encounter  [name], MD

## 2021-05-11 LAB — COMPREHENSIVE METABOLIC PANEL
ALT: 18 IU/L (ref 0–44)
AST: 11 IU/L (ref 0–40)
Albumin/Globulin Ratio: 1.7 (ref 1.2–2.2)
Albumin: 4.3 g/dL (ref 4.0–5.0)
Alkaline Phosphatase: 110 IU/L (ref 44–121)
BUN/Creatinine Ratio: 9 (ref 9–20)
BUN: 9 mg/dL (ref 6–20)
Bilirubin Total: 1.1 mg/dL (ref 0.0–1.2)
CO2: 24 mmol/L (ref 20–29)
Calcium: 9.2 mg/dL (ref 8.7–10.2)
Chloride: 102 mmol/L (ref 96–106)
Creatinine, Ser: 0.99 mg/dL (ref 0.76–1.27)
Globulin, Total: 2.5 g/dL (ref 1.5–4.5)
Glucose: 282 mg/dL — ABNORMAL HIGH (ref 65–99)
Potassium: 4 mmol/L (ref 3.5–5.2)
Sodium: 138 mmol/L (ref 134–144)
Total Protein: 6.8 g/dL (ref 6.0–8.5)
eGFR: 103 mL/min/{1.73_m2} (ref >59)

## 2021-05-11 LAB — CBC
Hematocrit: 43.4 % (ref 37.5–51.0)
Hemoglobin: 14.7 g/dL (ref 13.0–17.7)
MCH: 24.1 pg — ABNORMAL LOW (ref 26.6–33.0)
MCHC: 33.9 g/dL (ref 31.5–35.7)
MCV: 71 fL — ABNORMAL LOW (ref 79–97)
Platelets: 224 10*3/uL (ref 150–450)
RBC: 6.09 x10E6/uL — ABNORMAL HIGH (ref 4.14–5.80)
RDW: 15.2 % (ref 11.6–15.4)
WBC: 6.7 10*3/uL (ref 3.4–10.8)

## 2021-05-11 LAB — LIPID PANEL
Chol/HDL Ratio: 5.5 ratio — ABNORMAL HIGH (ref 0.0–5.0)
Cholesterol, Total: 149 mg/dL (ref 100–199)
HDL: 27 mg/dL — ABNORMAL LOW (ref >39)
LDL Chol Calc (NIH): 73 mg/dL (ref 0–99)
Triglycerides: 302 mg/dL — ABNORMAL HIGH (ref 0–149)
VLDL Cholesterol Cal: 49 mg/dL — ABNORMAL HIGH (ref 5–40)

## 2021-05-11 LAB — HEMOGLOBIN A1C
Est. average glucose Bld gHb Est-mCnc: 301 mg/dL
Hgb A1c MFr Bld: 12.1 % — ABNORMAL HIGH (ref 4.8–5.6)

## 2021-05-11 LAB — MICROALBUMIN / CREATININE URINE RATIO
Creatinine, Urine: 96.8 mg/dL
Microalb/Creat Ratio: 98 mg/g creat — ABNORMAL HIGH (ref 0–29)
Microalbumin, Urine: 94.4 ug/mL

## 2021-05-17 ENCOUNTER — Other Ambulatory Visit: Payer: Self-pay

## 2021-05-31 ENCOUNTER — Other Ambulatory Visit: Payer: Self-pay

## 2021-06-11 ENCOUNTER — Other Ambulatory Visit: Payer: Self-pay

## 2021-06-11 ENCOUNTER — Ambulatory Visit: Payer: Medicaid Other | Admitting: Pharmacist

## 2021-06-14 ENCOUNTER — Other Ambulatory Visit: Payer: Self-pay

## 2021-06-21 ENCOUNTER — Other Ambulatory Visit: Payer: Self-pay

## 2021-07-12 ENCOUNTER — Other Ambulatory Visit: Payer: Self-pay

## 2021-07-13 ENCOUNTER — Other Ambulatory Visit: Payer: Self-pay

## 2021-08-02 ENCOUNTER — Encounter: Payer: Self-pay | Admitting: Internal Medicine

## 2021-08-03 ENCOUNTER — Encounter: Payer: Self-pay | Admitting: Internal Medicine

## 2021-08-23 ENCOUNTER — Other Ambulatory Visit: Payer: Self-pay

## 2021-09-13 ENCOUNTER — Ambulatory Visit: Payer: Medicaid Other | Attending: Internal Medicine | Admitting: Internal Medicine

## 2021-09-13 ENCOUNTER — Encounter: Payer: Self-pay | Admitting: Internal Medicine

## 2021-09-13 ENCOUNTER — Other Ambulatory Visit: Payer: Self-pay

## 2021-09-13 DIAGNOSIS — Z6841 Body Mass Index (BMI) 40.0 and over, adult: Secondary | ICD-10-CM

## 2021-09-13 DIAGNOSIS — I10 Essential (primary) hypertension: Secondary | ICD-10-CM

## 2021-09-13 DIAGNOSIS — E1169 Type 2 diabetes mellitus with other specified complication: Secondary | ICD-10-CM | POA: Diagnosis not present

## 2021-09-13 DIAGNOSIS — Z23 Encounter for immunization: Secondary | ICD-10-CM | POA: Diagnosis not present

## 2021-09-13 DIAGNOSIS — I5030 Unspecified diastolic (congestive) heart failure: Secondary | ICD-10-CM | POA: Diagnosis not present

## 2021-09-13 DIAGNOSIS — G4733 Obstructive sleep apnea (adult) (pediatric): Secondary | ICD-10-CM

## 2021-09-13 DIAGNOSIS — Z9989 Dependence on other enabling machines and devices: Secondary | ICD-10-CM

## 2021-09-13 DIAGNOSIS — E785 Hyperlipidemia, unspecified: Secondary | ICD-10-CM

## 2021-09-13 LAB — GLUCOSE, POCT (MANUAL RESULT ENTRY): POC Glucose: 99 mg/dl (ref 70–99)

## 2021-09-13 LAB — POCT GLYCOSYLATED HEMOGLOBIN (HGB A1C): HbA1c, POC (controlled diabetic range): 8.8 % — AB (ref 0.0–7.0)

## 2021-09-13 MED ORDER — DEXCOM G6 TRANSMITTER MISC
1.0000 | Freq: Every day | 4 refills | Status: DC
  Filled 2021-09-13: qty 1, 28d supply, fill #0

## 2021-09-13 MED ORDER — LANTUS SOLOSTAR 100 UNIT/ML ~~LOC~~ SOPN
18.0000 [IU] | PEN_INJECTOR | Freq: Every day | SUBCUTANEOUS | 6 refills | Status: DC
  Filled 2021-09-13: qty 15, 83d supply, fill #0
  Filled 2021-09-20 – 2021-10-26 (×3): qty 6, 33d supply, fill #0

## 2021-09-13 MED ORDER — DEXCOM G6 SENSOR MISC
1.0000 | Freq: Every day | 11 refills | Status: DC
  Filled 2021-09-13: qty 3, 28d supply, fill #0

## 2021-09-13 MED ORDER — DEXCOM G6 RECEIVER DEVI
1.0000 | Freq: Every day | 0 refills | Status: DC
  Filled 2021-09-13: qty 1, 30d supply, fill #0

## 2021-09-13 NOTE — Patient Instructions (Signed)
Increase insulin to 18 units daily.  Healthy Eating Following a healthy eating pattern may help you to achieve and maintain a healthy body weight, reduce the risk of chronic disease, and live a long and productive life. It is important to follow a healthy eating pattern at an appropriate calorie level for your body. Your nutritional needs should be met primarily through food by choosing a variety of nutrient-rich foods. What are tips for following this plan? Reading food labels Read labels and choose the following: Reduced or low sodium. Juices with 100% fruit juice. Foods with low saturated fats and high polyunsaturated and monounsaturated fats. Foods with whole grains, such as whole wheat, cracked wheat, brown rice, and wild rice. Whole grains that are fortified with folic acid. This is recommended for women who are pregnant or who want to become pregnant. Read labels and avoid the following: Foods with a lot of added sugars. These include foods that contain brown sugar, corn sweetener, corn syrup, dextrose, fructose, glucose, high-fructose corn syrup, honey, invert sugar, lactose, malt syrup, maltose, molasses, raw sugar, sucrose, trehalose, or turbinado sugar. Do not eat more than the following amounts of added sugar per day: 6 teaspoons (25 g) for women. 9 teaspoons (38 g) for men. Foods that contain processed or refined starches and grains. Refined grain products, such as white flour, degermed cornmeal, white bread, and white rice. Shopping Choose nutrient-rich snacks, such as vegetables, whole fruits, and nuts. Avoid high-calorie and high-sugar snacks, such as potato chips, fruit snacks, and candy. Use oil-based dressings and spreads on foods instead of solid fats such as butter, stick margarine, or cream cheese. Limit pre-made sauces, mixes, and "instant" products such as flavored rice, instant noodles, and ready-made pasta. Try more plant-protein sources, such as tofu, tempeh, black  beans, edamame, lentils, nuts, and seeds. Explore eating plans such as the Mediterranean diet or vegetarian diet. Cooking Use oil to saut or stir-fry foods instead of solid fats such as butter, stick margarine, or lard. Try baking, boiling, grilling, or broiling instead of frying. Remove the fatty part of meats before cooking. Steam vegetables in water or broth. Meal planning  At meals, imagine dividing your plate into fourths: One-half of your plate is fruits and vegetables. One-fourth of your plate is whole grains. One-fourth of your plate is protein, especially lean meats, poultry, eggs, tofu, beans, or nuts. Include low-fat dairy as part of your daily diet. Lifestyle Choose healthy options in all settings, including home, work, school, restaurants, or stores. Prepare your food safely: Wash your hands after handling raw meats. Keep food preparation surfaces clean by regularly washing with hot, soapy water. Keep raw meats separate from ready-to-eat foods, such as fruits and vegetables. Cook seafood, meat, poultry, and eggs to the recommended internal temperature. Store foods at safe temperatures. In general: Keep cold foods at 55F (4.4C) or below. Keep hot foods at 155F (60C) or above. Keep your freezer at [facility] (-17.8C) or below. Foods are no longer safe to eat when they have been between the temperatures of 40-155F (4.4-60C) for more than 2 hours. What foods should I eat? Fruits Aim to eat 2 cup-equivalents of fresh, canned (in natural juice), or frozen fruits each day. Examples of 1 cup-equivalent of fruit include 1 small apple, 8 large strawberries, 1 cup canned fruit,  cup dried fruit, or 1 cup 100% juice. Vegetables Aim to eat 2-3 cup-equivalents of fresh and frozen vegetables each day, including different varieties and colors. Examples of 1 cup-equivalent of vegetables  include 2 medium carrots, 2 cups raw, leafy greens, 1 cup chopped vegetable (raw or cooked), or 1  medium baked potato. Grains Aim to eat 6 ounce-equivalents of whole grains each day. Examples of 1 ounce-equivalent of grains include 1 slice of bread, 1 cup ready-to-eat cereal, 3 cups popcorn, or  cup cooked rice, pasta, or cereal. Meats and other proteins Aim to eat 5-6 ounce-equivalents of protein each day. Examples of 1 ounce-equivalent of protein include 1 egg, 1/2 cup nuts or seeds, or 1 tablespoon (16 g) peanut butter. A cut of meat or fish that is the size of a deck of cards is about 3-4 ounce-equivalents. Of the protein you eat each week, try to have at least 8 ounces come from seafood. This includes salmon, trout, herring, and anchovies. Dairy Aim to eat 3 cup-equivalents of fat-free or low-fat dairy each day. Examples of 1 cup-equivalent of dairy include 1 cup (240 mL) milk, 8 ounces (250 g) yogurt, 1 ounces (44 g) natural cheese, or 1 cup (240 mL) fortified soy milk. Fats and oils Aim for about 5 teaspoons (21 g) per day. Choose monounsaturated fats, such as canola and olive oils, avocados, peanut butter, and most nuts, or polyunsaturated fats, such as sunflower, corn, and soybean oils, walnuts, pine nuts, sesame seeds, sunflower seeds, and flaxseed. Beverages Aim for six 8-oz glasses of water per day. Limit coffee to three to five 8-oz cups per day. Limit caffeinated beverages that have added calories, such as soda and energy drinks. Limit alcohol intake to no more than 1 drink a day for nonpregnant women and 2 drinks a day for men. One drink equals 12 oz of beer (355 mL), 5 oz of wine (148 mL), or 1 oz of hard liquor (44 mL). Seasoning and other foods Avoid adding excess amounts of salt to your foods. Try flavoring foods with herbs and spices instead of salt. Avoid adding sugar to foods. Try using oil-based dressings, sauces, and spreads instead of solid fats. This information is based on general [place] nutrition guidelines. For more information, visit [employer]. Exact  amounts may vary based on your nutrition needs. Summary A healthy eating plan may help you to maintain a healthy weight, reduce the risk of chronic diseases, and stay active throughout your life. Plan your meals. Make sure you eat the right portions of a variety of nutrient-rich foods. Try baking, boiling, grilling, or broiling instead of frying. Choose healthy options in all settings, including home, work, school, restaurants, or stores. This information is not intended to replace advice given to you by your health care provider. Make sure you discuss any questions you have with your health care provider. Document Revised: 04/06/2021 Document Reviewed: 04/06/2021 Elsevier Patient Education  [place].

## 2021-09-13 NOTE — Progress Notes (Signed)
Patient ID: Paul Lamb, male    DOB: [birth year]  MRN: [id number]  CC: Diabetes and Hypertension   Subjective: Paul Lamb is a 36 y.o. male who presents for chronic ds management His concerns today include:  Patient with history of HTN, diastolic CHF with EF of 99991111, HTN, OSA on CPAP, HL, morbid obesity, diabetes with neuropathy, depression.  HYPERTENSION/CHF Currently taking: see medication list.  On Farxiga 10 mg, Norvasc 10 mg, Coreg 25 mg BID, Cozaar 100 mg, Hydralazine 100 mg TID, HCTZ 25 and lasix 20 mg Med Adherence: [x]  Yes    []  No Medication side effects: [x]  Yes  -gets HA about 30 mins after taking his meds.  Last 30-60 mins.  Adherence with salt restriction: [x]  Yes    []  No Home Monitoring?: []  Yes    [x]  No Monitoring Frequency:  Home BP results range:  SOB? []  Yes    [x]  No Chest Pain?: []  Yes    [x]  No Leg swelling?: []  Yes    [x]  No Headaches?: [x]  Yes  - see above  []  No Dizziness? []  Yes    [x]  No Comments:   OSA: using CPAP consistently  [id number] and tolerating Lipitor  DIABETES TYPE 2/Obesity Last A1C:   Results for orders placed or performed in visit on 09/13/21  POCT glucose (manual entry)  Result Value Ref Range   POC Glucose 99 70 - 99 mg/dl  POCT glycosylated hemoglobin (Hb A1C)  Result Value Ref Range   Hemoglobin A1C     HbA1c POC (<> result, manual entry)     HbA1c, POC (prediabetic range)     HbA1c, POC (controlled diabetic range) 8.8 (A) 0.0 - 7.0 %    Med Adherence:  [x]  Yes  -Lantus 16 units, Victoza daily, Glucotrol 10 mg BID, Metformin 1 gram 1 BID Medication side effects:  []  Yes    [x]  No Home Monitoring?  [x]  Yes; once a day in mornings.  Would like to get Dexcom Home glucose results range: in a.m before BF 99-140.  Highest 160s.  No more 200s since increase insulin Diet Adherence:  trying to eat healthy even when on the road Exercise: [x]  Yes  -goes to gym QOD - 1-2 miles on TM and light wgh lifting.  Gained 38 lbs  since last visit 04/2021. He was restarted on insulin on that visit; A1C was 12.1 Hypoglycemic episodes?: []  Yes    [x]  No.  He keeps candies and glucose tablets in his truck Numbness of the feet? []  Yes    [x]  No Retinopathy hx? []  Yes    []  No Last eye exam: no blurred vision. Had eye exam but his insurance does not pay for the DM part of the exam. Cost $60 which he can not afford.   Comments:   HM: needs flu shot.  He has not rec COVID Booster Patient Active Problem List   Diagnosis Date Noted   Diabetic polyneuropathy associated with type 2 diabetes mellitus ([place]) 02/23/2018   Diabetes mellitus type 2 in obese ([place]) [id number]   Diastolic congestive heart failure ([place]) 11/28/2017   Proteinuria    Erectile dysfunction 05/19/2017   Depression 05/19/2017   Essential hypertension 01/12/2016   Sleep apnea 01/12/2016   Abnormal EKG    Hand pain, left 11/01/2015   Morbid obesity ([place]) 11/01/2015     Current Outpatient Medications on File Prior to Visit  Medication Sig Dispense Refill   amLODipine (NORVASC) 10 MG tablet TAKE  1 TABLET (10 MG TOTAL) BY MOUTH DAILY. 90 tablet 2   aspirin 81 MG EC tablet Take 1 tablet (81 mg total) by mouth daily. 100 tablet 1   atorvastatin (LIPITOR) 40 MG tablet TAKE 1 TABLET (40 MG TOTAL) BY MOUTH DAILY AT 6 PM. 90 tablet 2   carvedilol (COREG) 25 MG tablet TAKE 1 TABLET (25 MG TOTAL) BY MOUTH 2 (TWO) TIMES DAILY WITH A MEAL. 180 tablet 2   colchicine 0.6 MG tablet Take 1 tab PO Q 8 hrs on day 1.  Then 1 tab PO daily x 3 days. (Patient not taking: Reported on 09/13/2021) 6 tablet 0   dapagliflozin propanediol (FARXIGA) 10 MG TABS tablet TAKE 1 TABLET (10 MG TOTAL) BY MOUTH DAILY. 90 tablet 2   furosemide (LASIX) 20 MG tablet TAKE 1 TABLET (20 MG TOTAL) BY MOUTH DAILY. 90 tablet 2   glipiZIDE (GLUCOTROL) 10 MG tablet TAKE 1 TABLET (10 MG TOTAL) BY MOUTH 2 (TWO) TIMES DAILY BEFORE A MEAL. 180 tablet 2   hydrALAZINE (APRESOLINE) 100 MG tablet TAKE 1 TABLET  (100 MG TOTAL) BY MOUTH 3 (THREE) TIMES DAILY. 270 tablet 2   hydrochlorothiazide (HYDRODIURIL) 25 MG tablet TAKE 1 TABLET (25 MG TOTAL) BY MOUTH DAILY. 30 tablet 11   Insulin Pen Needle (BD PEN NEEDLE NANO U/F) 32G X 4 MM MISC USE TO INJECT VICTOZA DAILY 100 each 3   liraglutide (VICTOZA) 18 MG/3ML SOPN INJECT 1.8 MG INTO THE SKIN DAILY. 12 mL 4   losartan (COZAAR) 100 MG tablet TAKE 1 TABLET (100 MG TOTAL) BY MOUTH DAILY. 90 tablet 2   metFORMIN (GLUCOPHAGE) 1000 MG tablet TAKE 1 TABLET (1,000 MG TOTAL) BY MOUTH 2 (TWO) TIMES DAILY WITH A MEAL. 180 tablet 2   sildenafil (VIAGRA) 100 MG tablet Take 1 tab 1/2-1 hr prior to intercourse PRN. 15 tablet 3   No current facility-administered medications on file prior to visit.    Allergies  Allergen Reactions   Imdur [Isosorbide Dinitrate] Other (See Comments)    Headaches    Penicillins Other (See Comments)    From childhood; reaction not known: Has patient had a PCN reaction causing immediate rash, facial/tongue/throat swelling, SOB or lightheadedness with hypotension: Unk Has patient had a PCN reaction causing severe rash involving mucus membranes or skin necrosis: Unk Has patient had a PCN reaction that required hospitalization: Unk Has patient had a PCN reaction occurring within the last 10 years: No If all of the above answers are "NO", then may proceed with Cephalosporin use.     Social History   Socioeconomic History   Marital status: Single    Spouse name: Not on file   Number of children: Not on file   Years of education: Not on file   Highest education level: Not on file  Occupational History   Not on file  Tobacco Use   Smoking status: Never   Smokeless tobacco: Never  Vaping Use   Vaping Use: Never used  Substance and Sexual Activity   Alcohol use: Yes    Alcohol/week: 0.0 standard drinks   Drug use: No   Sexual activity: Yes  Other Topics Concern   Not on file  Social History Narrative   Not on file   Social  Determinants of Health   Financial Resource Strain: Not on file  Food Insecurity: Not on file  Transportation Needs: Not on file  Physical Activity: Not on file  Stress: Not on file  Social Connections: Not  on file  Intimate Partner Violence: Not on file    Family History  Problem Relation Age of Onset   Hypertension Other    Diabetes Mellitus II Other     Past Surgical History:  Procedure Laterality Date   NO PAST SURGERIES      ROS: Review of Systems Negative except as stated above  PHYSICAL EXAM: BP 123/86    Pulse 84    Resp 16    Wt (!) 407 lb (184.6 kg)    SpO2 97%    BMI 52.26 kg/m   Wt Readings from Last 3 Encounters:  09/13/21 (!) 407 lb (184.6 kg)  05/10/21 (!) 369 lb (167.4 kg)  06/12/20 (!) 403 lb (182.8 kg)    Physical Exam  General appearance - alert, well appearing, and in no distress Mental status - normal mood, behavior, speech, dress, motor activity, and thought processes Neck - supple, no significant adenopathy Chest - clear to auscultation, no wheezes, rales or rhonchi, symmetric air entry Heart - normal rate, regular rhythm, normal S1, S2, no murmurs, rubs, clicks or gallops Extremities - trace LE edema Diabetic Foot Exam - Simple   Simple Foot Form Diabetic Foot exam was performed with the following findings: Yes 09/13/2021 12:59 PM  Visual Inspection See comments: Yes Sensation Testing Intact to touch and monofilament testing bilaterally: Yes Pulse Check Posterior Tibialis and Dorsalis pulse intact bilaterally: Yes Comments Toe nails are overgrown. Callous on medial aspect of Lamb feet.  A lot of dead skin build up on medial heels BP.     CMP Latest Ref Rng & Units 05/10/2021 06/12/2020 06/28/2019  Glucose 65 - 99 mg/dL 282(H) 72 87  BUN 6 - 20 mg/dL 9 16 15   Creatinine 0.76 - 1.27 mg/dL 0.99 1.14 1.07  Sodium 134 - 144 mmol/L 138 140 140  Potassium 3.5 - 5.2 mmol/L 4.0 4.0 3.7  Chloride 96 - 106 mmol/L 102 101 99  CO2 20 - 29 mmol/L  24 25 26   Calcium 8.7 - 10.2 mg/dL 9.2 10.3(H) 9.6  Total Protein 6.0 - 8.5 g/dL 6.8 7.3 -  Total Bilirubin 0.0 - 1.2 mg/dL 1.1 0.9 -  Alkaline Phos 44 - 121 IU/L 110 99 -  AST 0 - 40 IU/L 11 14 -  ALT 0 - 44 IU/L 18 21 -   Lipid Panel     Component Value Date/Time   CHOL 149 05/10/2021 1550   TRIG 302 (H) 05/10/2021 1550   HDL 27 (L) 05/10/2021 1550   CHOLHDL 5.5 (H) 05/10/2021 1550   CHOLHDL 7.6 09/10/2017 0451   VLDL 29 09/10/2017 0451   LDLCALC 73 05/10/2021 1550    CBC    Component Value Date/Time   WBC 6.7 05/10/2021 1550   WBC 9.3 03/17/2019 1900   RBC 6.09 (H) 05/10/2021 1550   RBC 6.43 (H) 03/17/2019 1900   HGB 14.7 05/10/2021 1550   HCT 43.4 05/10/2021 1550   PLT 224 05/10/2021 1550   MCV 71 (L) 05/10/2021 1550   MCH 24.1 (L) 05/10/2021 1550   MCH 24.3 (L) 03/17/2019 1900   MCHC 33.9 05/10/2021 1550   MCHC 34.8 03/17/2019 1900   RDW 15.2 05/10/2021 1550   LYMPHSABS 1.9 11/01/2015 1043   MONOABS 0.6 11/01/2015 1043   EOSABS 0.1 11/01/2015 1043   BASOSABS 0.1 11/01/2015 1043    ASSESSMENT AND PLAN: 1. Type 2 diabetes mellitus with morbid obesity ([place]) -commended him on improving his eating habits and moving more.  Wgh  gain seen partially due to addition of insulin on last visit. Commended him on the improvement in A1C compared to last visit.  Recommend slight increase Lantus from 16 to 18 units.  Rec checking BS TID before meals with goal being 90-130.  Rxn sent for Dexcom to see if his insurance would cover.  Went over signs/symptoms of hypoglycemic and how to treat.  Encourage him to get DM eye exam as soon as he is able to afford - POCT glucose (manual entry) - POCT glycosylated hemoglobin (Hb A1C) - insulin glargine (LANTUS SOLOSTAR) 100 UNIT/ML Solostar Pen; Inject 18 Units into the skin at bedtime.  Dispense: 45 mL; Refill: 6 - Continuous Blood Gluc Transmit (DEXCOM G6 TRANSMITTER) MISC; 1 packet by Does not apply route daily.  Dispense: 1 each; Refill:  4 - Continuous Blood Gluc Sensor (DEXCOM G6 SENSOR) MISC; use 1 route daily.  Dispense: 3 each; Refill: 11 - Continuous Blood Gluc Receiver (DEXCOM G6 RECEIVER) DEVI; use device route daily.  Dispense: 1 each; Refill: 0  2. Essential hypertension Repeat BP closer to goal.  Pt on max dose of all meds.  No changes made at this time.  Continue to limit salt in the foods  3. Hyperlipidemia associated with type 2 diabetes mellitus ([facility]) Continue Lipitor 40 mg  4. Diastolic congestive heart failure, unspecified HF chronicity ([place]) Compensated.  Continue Furosemide, Farxiga and current BP meds  5. OSA on CPAP Commended him on compliance with CPAP.  Continue to use daily  6. Need for influenza vaccination Given today.     Patient was given the opportunity to ask questions.  Patient verbalized understanding of the plan and was able to repeat key elements of the plan.   Orders Placed This Encounter  Procedures   POCT glucose (manual entry)   POCT glycosylated hemoglobin (Hb A1C)     Requested Prescriptions   Signed Prescriptions Disp Refills   insulin glargine (LANTUS SOLOSTAR) 100 UNIT/ML Solostar Pen 45 mL 6    Sig: Inject 18 Units into the skin at bedtime.   Continuous Blood Gluc Transmit (DEXCOM G6 TRANSMITTER) MISC 1 each 4    Sig: 1 packet by Does not apply route daily.   Continuous Blood Gluc Sensor (DEXCOM G6 SENSOR) MISC 3 each 11    Sig: use 1 route daily.   Continuous Blood Gluc Receiver (DEXCOM G6 RECEIVER) DEVI 1 each 0    Sig: use device route daily.    Return in about 4 months (around 01/11/2022).  Karle Plumber, MD, FACP

## 2021-09-14 ENCOUNTER — Other Ambulatory Visit: Payer: Self-pay

## 2021-09-16 ENCOUNTER — Encounter: Payer: Self-pay | Admitting: Internal Medicine

## 2021-09-16 MED ORDER — FREESTYLE LIBRE SENSOR SYSTEM MISC
12 refills | Status: DC
  Filled 2021-09-16: qty 2, 14d supply, fill #0

## 2021-09-16 MED ORDER — FREESTYLE LIBRE READER DEVI
1.0000 | Freq: Once | 0 refills | Status: AC
  Filled 2021-09-16: qty 1, 14d supply, fill #0

## 2021-09-17 ENCOUNTER — Other Ambulatory Visit: Payer: Self-pay

## 2021-09-20 ENCOUNTER — Other Ambulatory Visit: Payer: Self-pay

## 2021-09-21 ENCOUNTER — Other Ambulatory Visit: Payer: Self-pay

## 2021-09-21 ENCOUNTER — Telehealth: Payer: Self-pay

## 2021-09-21 NOTE — Telephone Encounter (Signed)
Patient aware that PA for Dexcom and Freestyle Elenor Legato has been denied.  Medical criteria not met.

## 2021-10-05 ENCOUNTER — Encounter: Payer: Self-pay | Admitting: Cardiovascular Disease

## 2021-10-15 ENCOUNTER — Other Ambulatory Visit: Payer: Self-pay

## 2021-10-22 ENCOUNTER — Other Ambulatory Visit: Payer: Self-pay

## 2021-10-25 ENCOUNTER — Other Ambulatory Visit: Payer: Self-pay

## 2021-10-26 ENCOUNTER — Other Ambulatory Visit: Payer: Self-pay

## 2021-10-26 MED ORDER — INSULIN GLARGINE 100 UNITS/ML SOLOSTAR PEN
PEN_INJECTOR | SUBCUTANEOUS | 0 refills | Status: DC
  Filled 2021-10-26: qty 15, 83d supply, fill #0
  Filled 2022-01-29: qty 15, 83d supply, fill #1
  Filled 2022-06-23: qty 15, 83d supply, fill #2

## 2021-11-29 ENCOUNTER — Other Ambulatory Visit: Payer: Self-pay

## 2021-11-30 ENCOUNTER — Other Ambulatory Visit: Payer: Self-pay

## 2021-11-30 ENCOUNTER — Encounter: Payer: Self-pay | Admitting: Internal Medicine

## 2021-11-30 ENCOUNTER — Telehealth: Payer: Self-pay | Admitting: Internal Medicine

## 2021-11-30 ENCOUNTER — Ambulatory Visit: Payer: Medicaid Other | Attending: Internal Medicine | Admitting: Internal Medicine

## 2021-11-30 DIAGNOSIS — E1169 Type 2 diabetes mellitus with other specified complication: Secondary | ICD-10-CM | POA: Diagnosis not present

## 2021-11-30 DIAGNOSIS — I5030 Unspecified diastolic (congestive) heart failure: Secondary | ICD-10-CM | POA: Diagnosis not present

## 2021-11-30 DIAGNOSIS — I10 Essential (primary) hypertension: Secondary | ICD-10-CM

## 2021-11-30 DIAGNOSIS — G4733 Obstructive sleep apnea (adult) (pediatric): Secondary | ICD-10-CM | POA: Diagnosis not present

## 2021-11-30 DIAGNOSIS — Z9989 Dependence on other enabling machines and devices: Secondary | ICD-10-CM

## 2021-11-30 LAB — POCT GLYCOSYLATED HEMOGLOBIN (HGB A1C): HbA1c, POC (controlled diabetic range): 8.5 % — AB (ref 0.0–7.0)

## 2021-11-30 LAB — GLUCOSE, POCT (MANUAL RESULT ENTRY): POC Glucose: 146 mg/dl — AB (ref 70–99)

## 2021-11-30 MED ORDER — TIRZEPATIDE 5 MG/0.5ML ~~LOC~~ SOAJ
5.0000 mg | SUBCUTANEOUS | 6 refills | Status: DC
  Filled 2021-11-30: qty 2, 28d supply, fill #0

## 2021-11-30 MED ORDER — TRULICITY 1.5 MG/0.5ML ~~LOC~~ SOAJ
1.5000 mg | SUBCUTANEOUS | 6 refills | Status: DC
  Filled 2021-11-30: qty 2, 28d supply, fill #0
  Filled 2021-12-19 – 2021-12-22 (×2): qty 2, 28d supply, fill #1
  Filled 2022-01-26: qty 2, 28d supply, fill #2
  Filled 2022-03-17: qty 6, 84d supply, fill #3
  Filled 2022-03-25: qty 2, 28d supply, fill #3
  Filled 2022-06-23: qty 2, 28d supply, fill #4
  Filled 2022-08-15: qty 2, 28d supply, fill #5

## 2021-11-30 MED ORDER — GLIPIZIDE 5 MG PO TABS
5.0000 mg | ORAL_TABLET | Freq: Two times a day (BID) | ORAL | 2 refills | Status: DC
  Filled 2021-11-30: qty 60, 30d supply, fill #0
  Filled 2022-03-17: qty 120, 60d supply, fill #1
  Filled 2022-03-25: qty 60, 30d supply, fill #1
  Filled 2022-06-23: qty 60, 30d supply, fill #2

## 2021-11-30 NOTE — Telephone Encounter (Signed)
-----   Message from Drucilla Chalet, RPH-CPP sent at 11/30/2021  4:29 PM EDT ----- ?Hey Dr. Laural Benes,  ? ?I thought it's be easier to staff message you.  ? ?Mr. Wymore found out how to download his libre readings on to his computer. I asked him to print these off for [place] and bring them back.  ? ?Also, his insurance requires a PA for his Mounjaro, however, the PA requires that he tries and fails two GLP-1 agents and I think he's only ever been on Victoza. They will require him to try Trulicity or Ozempic before covering Mounjaro. I did show him how to use Moujaro with my sample Trulicity pen. These two agents use the same pen injecting device.  ? ?Just wanted to let you know.  ? ?Franky Macho ? ?

## 2021-11-30 NOTE — Progress Notes (Addendum)
? ? ?Patient ID: Paul Lamb, male    DOB: 06-14-1986  MRN: [id number] ? ?CC: Diabetes and Hypertension ? ? ?Subjective: ?Paul Lamb is a 36 y.o. male who presents for chronic ds management and to request form be completed for work. ?His concerns today include:  ?Patient with history of HTN, diastolic CHF with EF of [id number], HTN, OSA on CPAP, HL, morbid obesity, diabetes with neuropathy, depression. ? ?DM ?Results for orders placed or performed in visit on 11/30/21  ?POCT glucose (manual entry)  ?Result Value Ref Range  ? POC Glucose 146 (A) 70 - 99 mg/dl  ?POCT glycosylated hemoglobin (Hb A1C)  ?Result Value Ref Range  ? Hemoglobin A1C    ? HbA1c POC (<> result, manual entry)    ? HbA1c, POC (prediabetic range)    ? HbA1c, POC (controlled diabetic range) 8.5 (A) 0.0 - 7.0 %  ?Has form that needs to be completed.  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  Because he is now on insulin, he is requested to have completed insulin treated diabetes mellitus assessment form ?Has Libre meter but not sure how to down load his information ?Reports BS on avg 150-180 before meals, up to 280 after a meal.  I was able to see on his device that over the past 3 months his blood sugars have been within goal 65% of the time, above goal 34% of the time and below goal 1% of the time.  He has been having low BS episodes more so in afternoons b/w lunch and dinner.  On 11/24/2021 he had several lows.  Most days he eats BF and lunch but some days he does not eat dinner.  Does not feel hungry. He keeps snack on hand to eat whenever his libre device alerts him that his blood sugar is low. ?Confirms taking meds that include Lantus insulin 18 units daily, Victoza, glipizide 10 mg twice a day, Farxiga 10 mg daily and metformin 1 g twice a day ?Feels he does well with eating habits ?-Reports having had an eye exam [facility] less than a year ago but his insurance did not pay for the diabetic part of the exam and he was unable to afford at the  time. ?-denies any numbness or tingling in the feet or legs.  Not on any meds for neuropathy ? ?HYPERTENSION/CHF ?Currently taking: see medication list.  On Farxiga 10 mg, Norvasc 10 mg, Coreg 25 mg BID, Cozaar 100 mg, Hydralazine 100 mg TID, HCTZ 25 and lasix 20 mg ?Med Adherence: [x]  Yes    []  No ?Medication side effects: []  Yes   ?Adherence with salt restriction: [x]  Yes    []  No ?Home Monitoring?: [x]  Yes    []  No ?Monitoring Frequency: BID ?Home BP results range: SBP 120-140s/DBP 70-90 ?SOB? []  Yes    [x]  No ?Chest Pain?: []  Yes    [x]  No ?Leg swelling?: []  Yes    [x]  No ?Headaches?: []    [x]  No ?Dizziness? []  Yes    [x]  No ?Comments:  ?  ?OSA: using CPAP consistently and feels that he benefits from using it.  He wakes up feeling refreshed. ?  ?[id number] and tolerating Lipitor ?  ? ?Patient Active Problem List  ? Diagnosis Date Noted  ? Diabetes mellitus type 2 in obese ([place]) 12/29/2017  ? Diastolic congestive heart failure ([place]) 11/28/2017  ? Proteinuria   ? Erectile dysfunction 05/19/2017  ? Depression 05/19/2017  ? Essential hypertension 01/12/2016  ? Sleep apnea 01/12/2016  ?  Abnormal EKG   ? Hand pain, left 11/01/2015  ? Morbid obesity ([place]) 11/01/2015  ?  ? ?Current Outpatient Medications on File Prior to Visit  ?Medication Sig Dispense Refill  ? amLODipine (NORVASC) 10 MG tablet TAKE 1 TABLET (10 MG TOTAL) BY MOUTH DAILY. 90 tablet 2  ? aspirin 81 MG EC tablet Take 1 tablet (81 mg total) by mouth daily. 100 tablet 1  ? atorvastatin (LIPITOR) 40 MG tablet TAKE 1 TABLET (40 MG TOTAL) BY MOUTH DAILY AT 6 PM. 90 tablet 2  ? carvedilol (COREG) 25 MG tablet TAKE 1 TABLET (25 MG TOTAL) BY MOUTH 2 (TWO) TIMES DAILY WITH A MEAL. 180 tablet 2  ? colchicine 0.6 MG tablet Take 1 tab PO Q 8 hrs on day 1.  Then 1 tab PO daily x 3 days. (Patient not taking: Reported on 09/13/2021) 6 tablet 0  ? Continuous Blood Gluc Sensor (FREESTYLE LIBRE SENSOR SYSTEM) MISC Change sensor Q 2 wks 2 each 12  ? dapagliflozin propanediol  (FARXIGA) 10 MG TABS tablet TAKE 1 TABLET (10 MG TOTAL) BY MOUTH DAILY. 90 tablet 2  ? furosemide (LASIX) 20 MG tablet TAKE 1 TABLET (20 MG TOTAL) BY MOUTH DAILY. 90 tablet 2  ? hydrALAZINE (APRESOLINE) 100 MG tablet TAKE 1 TABLET (100 MG TOTAL) BY MOUTH 3 (THREE) TIMES DAILY. 270 tablet 2  ? hydrochlorothiazide (HYDRODIURIL) 25 MG tablet TAKE 1 TABLET (25 MG TOTAL) BY MOUTH DAILY. 30 tablet 11  ? insulin glargine (LANTUS) 100 unit/mL SOPN inject 18 units into the skin at bedtime 309 mL 0  ? Insulin Pen Needle (BD PEN NEEDLE NANO U/F) 32G X 4 MM MISC USE TO INJECT VICTOZA DAILY 100 each 3  ? losartan (COZAAR) 100 MG tablet TAKE 1 TABLET (100 MG TOTAL) BY MOUTH DAILY. 90 tablet 2  ? metFORMIN (GLUCOPHAGE) 1000 MG tablet TAKE 1 TABLET (1,000 MG TOTAL) BY MOUTH 2 (TWO) TIMES DAILY WITH A MEAL. 180 tablet 2  ? sildenafil (VIAGRA) 100 MG tablet Take 1 tab 1/2-1 hr prior to intercourse PRN. 15 tablet 3  ? ?No current facility-administered medications on file prior to visit.  ? ? ?Allergies  ?Allergen Reactions  ? Imdur [Isosorbide Dinitrate] Other (See Comments)  ?  Headaches ?  ? Penicillins Other (See Comments)  ?  From childhood; reaction not known: ?Has patient had a PCN reaction causing immediate rash, facial/tongue/throat swelling, SOB or lightheadedness with hypotension: Unk ?Has patient had a PCN reaction causing severe rash involving mucus membranes or skin necrosis: Unk ?Has patient had a PCN reaction that required hospitalization: Unk ?Has patient had a PCN reaction occurring within the last 10 years: No ?If all of the above answers are "NO", then may proceed with Cephalosporin use. ?  ? ? ?Social History  ? ?Socioeconomic History  ? Marital status: Single  ?  Spouse name: Not on file  ? Number of children: Not on file  ? Years of education: Not on file  ? Highest education level: Not on file  ?Occupational History  ? Not on file  ?Tobacco Use  ? Smoking status: Never  ? Smokeless tobacco: Never  ?Vaping Use  ?  Vaping Use: Never used  ?Substance and Sexual Activity  ? Alcohol use: Yes  ?  Alcohol/week: 0.0 standard drinks  ? Drug use: No  ? Sexual activity: Yes  ?Other Topics Concern  ? Not on file  ?Social History Narrative  ? Not on file  ? ?Social Determinants of  Health  ? ?Financial Resource Strain: Not on file  ?Food Insecurity: Not on file  ?Transportation Needs: Not on file  ?Physical Activity: Not on file  ?Stress: Not on file  ?Social Connections: Not on file  ?Intimate Partner Violence: Not on file  ? ? ?Family History  ?Problem Relation Age of Onset  ? Hypertension Other   ? Diabetes Mellitus II Other   ? ? ?Past Surgical History:  ?Procedure Laterality Date  ? NO PAST SURGERIES    ? ? ?ROS: ?Review of Systems ?Negative except as stated above ? ?PHYSICAL EXAM: ?BP 129/89   Pulse 79   Resp 16   Wt (!) 411 lb 6.4 oz (186.6 kg)   SpO2 95%   BMI 52.82 kg/m?   ?Wt Readings from Last 3 Encounters:  ?11/30/21 (!) 411 lb 6.4 oz (186.6 kg)  ?09/13/21 (!) 407 lb (184.6 kg)  ?05/10/21 (!) 369 lb (167.4 kg)  ? ? ?Physical Exam ? ?General appearance - alert, well appearing, obese young African-American male and in no distress ?Mental status - normal mood, behavior, speech, dress, motor activity, and thought processes ?Neck - supple, no significant adenopathy ?Chest - clear to auscultation, no wheezes, rales or rhonchi, symmetric air entry ?Heart - normal rate, regular rhythm, normal S1, S2, no murmurs, rubs, clicks or gallops ?Extremities - peripheral pulses normal, no pedal edema, no clubbing or cyanosis ?Foot exam revealed no ulcers.  Good sensation on leap exam. ? ?  Latest Ref Rng & Units 05/10/2021  ?  3:50 PM 06/12/2020  ?  4:50 PM 06/28/2019  ?  3:15 PM  ?CMP  ?Glucose 65 - 99 mg/dL 282   72   87    ?BUN 6 - 20 mg/dL 9   16   15     ?Creatinine 0.76 - 1.27 mg/dL 0.99   1.14   1.07    ?Sodium 134 - 144 mmol/L 138   140   140    ?Potassium 3.5 - 5.2 mmol/L 4.0   4.0   3.7    ?Chloride 96 - 106 mmol/L 102   101   99     ?CO2 20 - 29 mmol/L 24   25   26     ?Calcium 8.7 - 10.2 mg/dL 9.2   10.3   9.6    ?Total Protein 6.0 - 8.5 g/dL 6.8   7.3     ?Total Bilirubin 0.0 - 1.2 mg/dL 1.1   0.9     ?Alkaline Phos 44 - 121 IU/L 110

## 2021-11-30 NOTE — Patient Instructions (Signed)
Stop Victoza. ?Start Mounjaro once a week. ?Decrease Glipizide to 5 mg twice a day for one week.  If blood sugars are staying between 90-140 before meals, you can stop the Glipizide after 1-2 weeks.  ?Please bring me information on the date of your last eye exam and the findings. Please know that this form requires you to have a diabetic eye exam ?

## 2021-12-01 ENCOUNTER — Other Ambulatory Visit: Payer: Self-pay

## 2021-12-01 ENCOUNTER — Encounter: Payer: Self-pay | Admitting: Internal Medicine

## 2021-12-06 ENCOUNTER — Encounter: Payer: Self-pay | Admitting: Internal Medicine

## 2021-12-20 ENCOUNTER — Other Ambulatory Visit: Payer: Self-pay

## 2021-12-22 ENCOUNTER — Other Ambulatory Visit: Payer: Self-pay

## 2021-12-30 LAB — HM DIABETES EYE EXAM

## 2022-01-16 ENCOUNTER — Other Ambulatory Visit: Payer: Self-pay | Admitting: Internal Medicine

## 2022-01-26 ENCOUNTER — Other Ambulatory Visit: Payer: Self-pay

## 2022-01-31 ENCOUNTER — Other Ambulatory Visit: Payer: Self-pay

## 2022-02-17 ENCOUNTER — Other Ambulatory Visit: Payer: Self-pay

## 2022-03-17 ENCOUNTER — Other Ambulatory Visit: Payer: Self-pay | Admitting: Internal Medicine

## 2022-03-17 ENCOUNTER — Other Ambulatory Visit: Payer: Self-pay

## 2022-03-17 DIAGNOSIS — I10 Essential (primary) hypertension: Secondary | ICD-10-CM

## 2022-03-17 DIAGNOSIS — E1169 Type 2 diabetes mellitus with other specified complication: Secondary | ICD-10-CM

## 2022-03-17 DIAGNOSIS — I5030 Unspecified diastolic (congestive) heart failure: Secondary | ICD-10-CM

## 2022-03-17 MED ORDER — AMLODIPINE BESYLATE 10 MG PO TABS
ORAL_TABLET | Freq: Every day | ORAL | 0 refills | Status: DC
  Filled 2022-03-17 – 2022-03-25 (×2): qty 90, 90d supply, fill #0

## 2022-03-17 MED ORDER — LOSARTAN POTASSIUM 100 MG PO TABS
ORAL_TABLET | Freq: Every day | ORAL | 0 refills | Status: DC
  Filled 2022-03-17 – 2022-03-25 (×2): qty 90, 90d supply, fill #0

## 2022-03-17 MED ORDER — ATORVASTATIN CALCIUM 40 MG PO TABS
ORAL_TABLET | Freq: Every day | ORAL | 0 refills | Status: DC
  Filled 2022-03-17 – 2022-03-25 (×2): qty 90, 90d supply, fill #0

## 2022-03-17 MED ORDER — DAPAGLIFLOZIN PROPANEDIOL 10 MG PO TABS
ORAL_TABLET | Freq: Every day | ORAL | 0 refills | Status: DC
  Filled 2022-03-17 – 2022-03-25 (×2): qty 90, 90d supply, fill #0

## 2022-03-17 MED ORDER — FUROSEMIDE 20 MG PO TABS
ORAL_TABLET | Freq: Every day | ORAL | 0 refills | Status: DC
  Filled 2022-03-17 – 2022-03-25 (×2): qty 90, 90d supply, fill #0

## 2022-03-24 ENCOUNTER — Other Ambulatory Visit: Payer: Self-pay

## 2022-03-25 ENCOUNTER — Other Ambulatory Visit: Payer: Self-pay

## 2022-04-01 ENCOUNTER — Other Ambulatory Visit: Payer: Self-pay

## 2022-05-02 ENCOUNTER — Encounter: Payer: Self-pay | Admitting: Internal Medicine

## 2022-05-02 ENCOUNTER — Other Ambulatory Visit: Payer: Self-pay | Admitting: Internal Medicine

## 2022-05-02 DIAGNOSIS — S62101A Fracture of unspecified carpal bone, right wrist, initial encounter for closed fracture: Secondary | ICD-10-CM

## 2022-05-03 ENCOUNTER — Encounter: Payer: Self-pay | Admitting: Sports Medicine

## 2022-05-03 ENCOUNTER — Ambulatory Visit (INDEPENDENT_AMBULATORY_CARE_PROVIDER_SITE_OTHER): Payer: Worker's Compensation

## 2022-05-03 ENCOUNTER — Ambulatory Visit (INDEPENDENT_AMBULATORY_CARE_PROVIDER_SITE_OTHER): Payer: Worker's Compensation | Admitting: Sports Medicine

## 2022-05-03 ENCOUNTER — Ambulatory Visit: Payer: Self-pay | Admitting: *Deleted

## 2022-05-03 VITALS — BP 153/96 | HR 90 | Ht 72.05 in | Wt 389.0 lb

## 2022-05-03 DIAGNOSIS — M25531 Pain in right wrist: Secondary | ICD-10-CM

## 2022-05-03 DIAGNOSIS — S52551A Other extraarticular fracture of lower end of right radius, initial encounter for closed fracture: Secondary | ICD-10-CM

## 2022-05-03 DIAGNOSIS — E1169 Type 2 diabetes mellitus with other specified complication: Secondary | ICD-10-CM

## 2022-05-03 DIAGNOSIS — S93402A Sprain of unspecified ligament of left ankle, initial encounter: Secondary | ICD-10-CM | POA: Diagnosis not present

## 2022-05-03 DIAGNOSIS — M25572 Pain in left ankle and joints of left foot: Secondary | ICD-10-CM | POA: Diagnosis not present

## 2022-05-03 DIAGNOSIS — E669 Obesity, unspecified: Secondary | ICD-10-CM

## 2022-05-03 NOTE — Progress Notes (Signed)
Paul Lamb - 36 y.o. male MRN [id number]  Date of birth: 1986-06-24  Office Visit Note: Visit Date: 05/03/2022 PCP: Marcine Matar, MD Referred by: Marcine Matar, MD  Subjective: Chief Complaint  Patient presents with   Right Wrist - Pain   Left Ankle - Pain   HPI: Paul Lamb is a pleasant 36 y.o. male who presents today for acute right wrist pain, left ankle and foot pain.  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  He had some immediate pain about the wrist and ankle.  He drove himself to the ED in [place] and did have x-rays of the right wrist.  Independent review of the ED note in care everywhere did label a nondisplaced fracture of the distal radial metaphysis, however I cannot see their imaging on my EMR.  Patient was placed in a volar wrist slab and told to follow-up with orthopedics. He states his left ankle is still bothersome as well.  There is pain and swelling over the anterior and medial aspect of the ankle.  Unsure if he suffered an inversion or eversion injury.  He was given Tylenol 3 for pain but has not taken it yet.  No other treatment modalities for the rest of the ankle today.  Pertinent ROS were reviewed with the patient and found to be negative unless otherwise specified above in HPI.   Assessment & Plan: Visit Diagnoses:  1. Other closed extra-articular fracture of distal end of right radius, initial encounter   2. Pain in left ankle and joints of left foot   3. Pain in right wrist   4. Sprain of left ankle, unspecified ligament, initial encounter   5. Diabetes mellitus type 2 in obese [facility])    Plan: Discussed with [name] that he did suffer a distal radius fracture.  Initial x-rays are reassuring as this is extra-articular and there is no significant displacement or angulation, however we did discuss that at  times these do require surgical fixation given his risk factors of diabetes. We will continue him in his volar splint and he will follow-up in about 7-10 days we will we will repeat x-rays out of the splint.  As long as the fracture maintains appropriate angulation we will transition him likely into a short arm cast.  Discussed with him immobilization length is usually between 4 and 6 weeks.  In terms of the ankle, it does appears that he has more of a soft tissue injury with an ankle sprain.  X-rays were reassuring against any fracture.  We did fit him with a lace up ASO ankle brace today.  Recommended rest, elevation and icing for the ankle.  He may take Tylenol.  Discussed for both the wrist and the ankle he may take occasional NSAIDs, although would avoid prolonged consistent therapy with a theoretical risk of delaying fracture healing.  Recommend tighter control of diabetes as this can delay fracture healing, last A1c was 12.1. We will have him follow-up in the next 7-10 days with repeat wrist x-rays out of the splint.  Follow-up: Return for Follow-up in the next 7-10 days with Dr. Shon Baton for repeat x-ray and transition to cast for wrist.   Meds & Orders: No orders of the defined types were placed in this encounter.   Orders Placed This Encounter  Procedures   XR Ankle Complete Left  XR Foot Complete Left   XR Wrist 2 Views Right     Procedures: No procedures performed      Clinical History: No specialty comments available.  He reports that he has never smoked. He has never used smokeless tobacco.  Recent Labs    05/10/21 1550 09/13/21 1116 11/30/21 1121  HGBA1C 12.1* 8.8* 8.5*    Objective:   Vital Signs: BP (!) 153/96   Pulse 90   Ht 6' 0.05" (1.83 m)   Wt (!) 389 lb (176.4 kg)   BMI 52.68 kg/m   Physical Exam  Gen: Well-appearing, in no acute distress; non-toxic CV: Regular Rate. Well-perfused. Warm.  Resp: Breathing unlabored on room air; no wheezing. Psych: Fluid  speech in conversation; appropriate affect; normal thought process Neuro: Sensation intact throughout. No gross coordination deficits.   Ortho Exam -Right wrist: There is a volar splint obtained to the right wrist.  He has intact range of motion of all 5 fingers.  Cap refill less than 2 seconds of all 5 fingers.  There is some soft tissue swelling of the dorsum of the hand.  Full range of motion of the elbow and shoulder.  -Left foot and ankle: Inspection demonstrates some soft tissue swelling over the medial and lateral aspects of the ankle.  There is no appreciable ecchymosis.  There is tenderness to palpation over the ATFL region as well as the medial aspect of the deltoid ligament.  Some limitation with dorsiflexion, no pain with plantarflexion.  Mildly lax with anterior drawer, negative Klieger's test.  Negative syndesmotic squeeze test.  Imaging: XR Foot Complete Left  Result Date: 05/03/2022 3 views of the left foot including AP, mortise and lateral film were obtained.  No acute fracture noted.  Ankle mortise appears intact.  Pes planovalgus noted, otherwise no abnormality.  XR Ankle Complete Left  Result Date: 05/03/2022 3 views of the left ankle including AP, oblique and lateral films were ordered and reviewed by myself.  X-rays demonstrate no acute fracture or bony abnormality.  XR Wrist 2 Views Right  Result Date: 05/03/2022 2 views of the right wrist including AP and lateral films were ordered and reviewed by myself.  X-rays demonstrate a transverse to obliquely oriented distal radius fracture at the metaphyseal region.  This is nondisplaced and there is no significant dorsal or volar tilt nor shortening.    Past Medical/Family/Surgical/Social History: Medications & Allergies reviewed per EMR, new medications updated. Patient Active Problem List   Diagnosis Date Noted   Diabetes mellitus type 2 in obese (HCC) 12/29/2017   Diastolic congestive heart failure (HCC) 11/28/2017    Proteinuria    Erectile dysfunction 05/19/2017   Depression 05/19/2017   Essential hypertension 01/12/2016   Sleep apnea 01/12/2016   Abnormal EKG    Hand pain, left 11/01/2015   Morbid obesity (HCC) 11/01/2015   Past Medical History:  Diagnosis Date   Acute combined systolic and diastolic CHF, NYHA class 3 (HCC) 11/18/2016   EF now 40-45% by echo   Acute diastolic (congestive) heart failure (HCC) 01/12/2016   Diabetes mellitus, new onset (HCC)    Hypertension    Morbid obesity (HCC)    OSA (obstructive sleep apnea)    Family History  Problem Relation Age of Onset   Hypertension Other    Diabetes Mellitus II Other    Past Surgical History:  Procedure Laterality Date   NO PAST SURGERIES     Social History   Occupational History   Not on  file  Tobacco Use   Smoking status: Never   Smokeless tobacco: Never  Vaping Use   Vaping Use: Never used  Substance and Sexual Activity   Alcohol use: Yes    Alcohol/week: 0.0 standard drinks of alcohol   Drug use: No   Sexual activity: Yes

## 2022-05-03 NOTE — Telephone Encounter (Signed)
Routing to CMA 

## 2022-05-03 NOTE — Progress Notes (Signed)
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 Reported to the ED where xrays of the wrist were taken. He is here today with a limp and complaining of pain also in that left foot wanting it to be xrayed today.He is in a splint for his wrist fracture.  Was given Tylenol #3 for pain, but has not taken it yet.

## 2022-05-03 NOTE — Telephone Encounter (Signed)
Summary: wrist discomfort / rx req   The patient would like to be prescribed medication for their recently broken right wrist   The patient injured themselves in [place] and was prescribed medication by Emergency Room staff but declined to have the prescription filled until they came home and are now unable to have the prescription filled at their preferred pharmacy   Please contact the patient further when possible        Chief Complaint: requesting pain medication  Symptoms: right wrist pain  Frequency: na Pertinent Negatives: Patient denies na Disposition: [] ED /[] Urgent Care (no appt availability in office) / [] Appointment(In office/virtual)/ []  [facility]/ [] Home Care/ [] Refused Recommended Disposition /[x] [facility]/ []  Follow-up with PCP Additional Notes:   No available appt until Oct. With PCP.  Recommended [facility] or [facility]. Please advise if patient can get appt today .      Reason for Disposition  Prescription request for new medicine (not a refill)  Answer Assessment - Initial Assessment Questions 1. NAME of MEDICINE: "What medicine(s) are you calling about?"     A medication for pain  for broken right wrist  2. QUESTION: "What is your question?" (e.g., double dose of medicine, side effect)     Can patient get Rx for wrist pain?  3. PRESCRIBER: "Who prescribed the medicine?" Reason: if prescribed by specialist, call should be referred to that group.     MD in  4. SYMPTOMS: "Do you have any symptoms?" If Yes, ask: "What symptoms are you having?"  "How bad are the symptoms (e.g., mild, moderate, severe)     Pain right wrist 5. PREGNANCY:  "Is there any chance that you are pregnant?" "When was your last menstrual period?"     na  Protocols used: Medication Question Call-A-AH

## 2022-05-04 NOTE — Telephone Encounter (Signed)
Please advise.----DD,RMA  

## 2022-05-06 NOTE — Telephone Encounter (Signed)
Pt was called and he states that he does not need appointment due to pharmacy filling his pain medication.

## 2022-05-10 ENCOUNTER — Ambulatory Visit (INDEPENDENT_AMBULATORY_CARE_PROVIDER_SITE_OTHER): Payer: Worker's Compensation | Admitting: Sports Medicine

## 2022-05-10 ENCOUNTER — Ambulatory Visit (INDEPENDENT_AMBULATORY_CARE_PROVIDER_SITE_OTHER): Payer: Worker's Compensation

## 2022-05-10 ENCOUNTER — Encounter: Payer: Self-pay | Admitting: Sports Medicine

## 2022-05-10 DIAGNOSIS — S52551A Other extraarticular fracture of lower end of right radius, initial encounter for closed fracture: Secondary | ICD-10-CM

## 2022-05-10 DIAGNOSIS — M25531 Pain in right wrist: Secondary | ICD-10-CM

## 2022-05-10 DIAGNOSIS — S62021A Displaced fracture of middle third of navicular [scaphoid] bone of right wrist, initial encounter for closed fracture: Secondary | ICD-10-CM | POA: Diagnosis not present

## 2022-05-10 NOTE — Progress Notes (Signed)
Paul Lamb - 36 y.o. male MRN [id number]  Date of birth: [birth year]  Office Visit Note: Visit Date: 05/10/2022 PCP: Ladell Pier, MD Referred by: Ladell Pier, MD  Subjective: Chief Complaint  Patient presents with   Right Wrist - Follow-up   HPI: Paul Lamb is a pleasant 36 y.o. male who presents today for follow-up of right wrist pain and known distal radius fracture.  DOI: 05/02/2022.  Patient has remained in the sugar-tong splint.  He does report that his swelling and pain has improved.  He is anxious to get out of immobilization, but is acknowledging that this is what well protected fracture.  Able to wiggle all 5 fingers without difficulty.  Reports his pain is a 2/10.  Taking Tylenol occasionally for pain control, but nothing consistently.  Denies any new injury.  Pertinent ROS were reviewed with the patient and found to be negative unless otherwise specified above in HPI.   Assessment & Plan: Visit Diagnoses:  1. Pain in right wrist   2. Other closed extra-articular fracture of distal end of right radius, initial encounter   3. Closed displaced fracture of middle third of scaphoid bone of right wrist, initial encounter    Plan: Discussed with Paul Lamb today that his distal radius fracture is maintaining great alignment and is showing early stages of bony healing.  I did discuss however with him that it does appear that he has an associated scaphoid waist fracture that is minimally displaced  It is interesting enough that he does not have much tenderness to palpation on physical exam today.  This was incompletely viewed and not visualized on previous x-rays commented on by the radiologist from the outside emergency department as well as our repeat x-rays in the splint during his last visit.  We did transition him from the splint into a short arm thumb spica cast today.  Discussed with him that his distal radius fracture usually will need immobilization for up to  6 weeks.  His scaphoid fracture may need an additional casting somewhere between 8-12 weeks, although he is anxious to get out of the cast.  We will see him back in about 4 weeks and reevaluate for bony healing and clinical healing with physical exam.  If he is moving well without pain, may consider placing back in a cast versus transitioning to an Exos wrist brace for support.  Can take Tylenol, recommend avoiding NSAIDs given theoretical risk of delaying bone healing.  He will call sooner if any issues arise, otherwise follow-up in 4 weeks with repeat x-rays.  Follow-up: Return in about 4 weeks (around 06/07/2022) for with repeat x-rays.   Meds & Orders: No orders of the defined types were placed in this encounter.   Orders Placed This Encounter  Procedures   XR Wrist 2 Views Right     Procedures: No procedures performed      Clinical History: No specialty comments available.  He reports that he has never smoked. He has never used smokeless tobacco.  Recent Labs    09/13/21 1116 11/30/21 1121  HGBA1C 8.8* 8.5*    Objective:    Physical Exam  Gen: Well-appearing, in no acute distress; non-toxic CV: Regular Rate. Well-perfused. Warm.  Resp: Breathing unlabored on room air; no wheezing. Psych: Fluid speech in conversation; appropriate affect; normal thought process Neuro: Sensation intact throughout. No gross coordination deficits.   Ortho Exam -Right wrist/hand: There is improvement in soft tissue swelling, trace to none today.  There is some very mild TTP with deep palpation of the distal radius as well as the volar aspect of the scaphoid bone.  There is no true TTP within the anatomical snuffbox or with [facility] grind test.  Able to wiggle all 5 fingers without difficulty.  EHL tendon intact with active and passive range of motion.  Neurovascular intact distally, radial pulse palpated.  Imaging: XR Wrist 2 Views Right  Result Date: 05/10/2022 3 views of the right wrist including  AP, tunnel view, and lateral films were ordered and reviewed by myself.  X-rays redemonstrate a nondisplaced oblique fracture of the distal radius, extra-articular.  There is a small degree of bony healing with good fracture alignment without malrotation or angulation.  Today's x-rays do show a minimally displaced mid body scaphoid fracture with some evidence of bony healing.  This does not visualized on previous x-rays although views were different at that time and patient was in a splint.   Past Medical/Family/Surgical/Social History: Medications & Allergies reviewed per EMR, new medications updated. Patient Active Problem List   Diagnosis Date Noted   Diabetes mellitus type 2 in obese (HCC) 12/29/2017   Diastolic congestive heart failure (HCC) 11/28/2017   Proteinuria    Erectile dysfunction 05/19/2017   Depression 05/19/2017   Essential hypertension 01/12/2016   Sleep apnea 01/12/2016   Abnormal EKG    Hand pain, left 11/01/2015   Morbid obesity (HCC) 11/01/2015   Past Medical History:  Diagnosis Date   Acute combined systolic and diastolic CHF, NYHA class 3 (HCC) 11/18/2016   EF now 40-45% by echo   Acute diastolic (congestive) heart failure (HCC) 01/12/2016   Diabetes mellitus, new onset (HCC)    Hypertension    Morbid obesity (HCC)    OSA (obstructive sleep apnea)    Family History  Problem Relation Age of Onset   Hypertension Other    Diabetes Mellitus II Other    Past Surgical History:  Procedure Laterality Date   NO PAST SURGERIES     Social History   Occupational History   Not on file  Tobacco Use   Smoking status: Never   Smokeless tobacco: Never  Vaping Use   Vaping Use: Never used  Substance and Sexual Activity   Alcohol use: Yes    Alcohol/week: 0.0 standard drinks of alcohol   Drug use: No   Sexual activity: Yes

## 2022-05-10 NOTE — Progress Notes (Signed)
States his wrist is feeling better.

## 2022-06-08 ENCOUNTER — Ambulatory Visit (INDEPENDENT_AMBULATORY_CARE_PROVIDER_SITE_OTHER): Payer: Worker's Compensation | Admitting: Sports Medicine

## 2022-06-08 ENCOUNTER — Ambulatory Visit (INDEPENDENT_AMBULATORY_CARE_PROVIDER_SITE_OTHER): Payer: Medicaid Other

## 2022-06-08 ENCOUNTER — Encounter: Payer: Self-pay | Admitting: Sports Medicine

## 2022-06-08 DIAGNOSIS — S62021G Displaced fracture of middle third of navicular [scaphoid] bone of right wrist, subsequent encounter for fracture with delayed healing: Secondary | ICD-10-CM

## 2022-06-08 DIAGNOSIS — S52551D Other extraarticular fracture of lower end of right radius, subsequent encounter for closed fracture with routine healing: Secondary | ICD-10-CM

## 2022-06-08 DIAGNOSIS — M25531 Pain in right wrist: Secondary | ICD-10-CM

## 2022-06-08 NOTE — Progress Notes (Signed)
Jackston [place] Samara - 36 y.o. male MRN [id number]  Date of birth: 08-Dec-1985  Office Visit Note: Visit Date: 06/08/2022 PCP: Ladell Pier, MD Referred by: Ladell Pier, MD  Subjective: Chief Complaint  Patient presents with   Right Wrist - Pain   HPI: Tyquan Carmickle Gonyer is a pleasant 36 y.o. male who presents today for follow-up of right wrist fracture and scaphoid fracture.  Patient has continue with immobilization in his thumb spica cast.  Denies any new injury.  Reports he is having no pain about the wrist or hand.  Not taking any medication for the pain.  He has been able to work with the cast in place.  Initial DOI: 05/02/22  Pertinent ROS were reviewed with the patient and found to be negative unless otherwise specified above in HPI.   Assessment & Plan: Visit Diagnoses:  1. Pain in right wrist   2. Other closed extra-articular fracture of distal end of right radius with routine healing, subsequent encounter   3. Closed displaced fracture of middle third of scaphoid of right wrist with delayed healing, subsequent encounter    Plan: Discussed with Joneen Boers that he does have x-ray evidence of a healing distal radius fracture.  I did discuss with him that based on his lax x-ray I believe he did suffer a scaphoid waist fracture.  This is incompletely evaluated on x-ray, I would like to obtain a CT scan of the wrist with thin slices to quantify the degree of his scaphoid fracture.  If this does not have significant displacement, we can continue treatment in the thumb spica cast, but would likely need between 8-12 weeks of immobilization.  If there is more significant displacement or factors that would increase nonunion, he may need surgical fixation at that time.  I will call him after results of the CT scan to discuss next steps and follow-up.  Follow-up: Will call to determine when to see next based on CT-scan    Meds & Orders: No orders of the defined types were placed in this  encounter.   Orders Placed This Encounter  Procedures   XR Wrist 2 Views Right   CT WRIST RIGHT WO CONTRAST     Procedures: No procedures performed      Clinical History: No specialty comments available.  He reports that he has never smoked. He has never used smokeless tobacco.  Recent Labs    09/13/21 1116 11/30/21 1121  HGBA1C 8.8* 8.5*    Objective:    Physical Exam  Gen: Well-appearing, in no acute distress; non-toxic CV: Regular Rate. Well-perfused. Warm.  Resp: Breathing unlabored on room air; no wheezing. Psych: Fluid speech in conversation; appropriate affect; normal thought process Neuro: Sensation intact throughout. No gross coordination deficits.   Ortho Exam -Right hand and wrist in thumb spica cast.  Able to wiggle all 4 fingers without difficulty.  Cap refill less than 2 seconds.  Full range of motion at the elbow and shoulder.  Neurovascular intact distally.  Imaging: XR Wrist 2 Views Right  Result Date: 06/08/2022 2 views of the right wrist including AP and lateral films were ordered and reviewed by myself.  X-ray was obtained in the cast.  There is evidence of a healing nondisplaced, oblique fracture of the distal radius in good alignment.  Incompletely visualized scaphoid waist fracture.   Past Medical/Family/Surgical/Social History: Medications & Allergies reviewed per EMR, new medications updated. Patient Active Problem List   Diagnosis Date Noted   Diabetes  mellitus type 2 in obese (HCC) 12/29/2017   Diastolic congestive heart failure (HCC) 11/28/2017   Proteinuria    Erectile dysfunction 05/19/2017   Depression 05/19/2017   Essential hypertension 01/12/2016   Sleep apnea 01/12/2016   Abnormal EKG    Hand pain, left 11/01/2015   Morbid obesity (HCC) 11/01/2015   Past Medical History:  Diagnosis Date   Acute combined systolic and diastolic CHF, NYHA class 3 (HCC) 11/18/2016   EF now 40-45% by echo   Acute diastolic (congestive) heart  failure (HCC) 01/12/2016   Diabetes mellitus, new onset (HCC)    Hypertension    Morbid obesity (HCC)    OSA (obstructive sleep apnea)    Family History  Problem Relation Age of Onset   Hypertension Other    Diabetes Mellitus II Other    Past Surgical History:  Procedure Laterality Date   NO PAST SURGERIES     Social History   Occupational History   Not on file  Tobacco Use   Smoking status: Never   Smokeless tobacco: Never  Vaping Use   Vaping Use: Never used  Substance and Sexual Activity   Alcohol use: Yes    Alcohol/week: 0.0 standard drinks of alcohol   Drug use: No   Sexual activity: Yes

## 2022-06-08 NOTE — Progress Notes (Signed)
Minimal pain today; doing ok in the cast

## 2022-06-13 ENCOUNTER — Ambulatory Visit
Admission: RE | Admit: 2022-06-13 | Discharge: 2022-06-13 | Disposition: A | Discharge status: Home / Self Care | Payer: No Typology Code available for payment source | Source: Ambulatory Visit | Attending: Sports Medicine | Admitting: Sports Medicine

## 2022-06-13 DIAGNOSIS — M25531 Pain in right wrist: Secondary | ICD-10-CM

## 2022-06-16 ENCOUNTER — Telehealth: Payer: Self-pay | Admitting: Sports Medicine

## 2022-06-16 ENCOUNTER — Other Ambulatory Visit: Payer: Self-pay | Admitting: Sports Medicine

## 2022-06-16 NOTE — Telephone Encounter (Signed)
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 Hey,  Can we notify him that his CT-scan showed a non-displaced scaphoid fracture and as we discussed at the visit this has a high likelihood of healing well with the cast only and not needing surgery. But we will need to follow-up with serial x-rays to ensure this. Can we please schedule an appointment for him in 4 weeks? We will remove the cast and repeat x-rays at that time.  In terms of the work note. His restrictions are as follows:  - Able to perform activity with the right hand as able as long as remaining in his wrist cast - 15 lb weight lifting restriction for the right upper extremity only  Dr. Brooks ----- Message ----- From: Lamanda Rudder C, LAT Sent: 06/13/2022   8:01 AM EDT To: Dana Brooks, DO Subject: FW: SECURE: Needing work status note please #   ----- Message ----- From: Zawatski, Tisha K Sent: 06/10/2022  10:54 AM EDT To: Ilze Roselli C Jelicia Nantz, LAT Subject: SECURE: Needing work status note please for #  Adam Demary  Needing a work note for date of service 06-08-22 visit please. Thx Tisha     

## 2022-06-16 NOTE — Telephone Encounter (Signed)
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 Hey,  Can we notify him that his CT-scan showed a non-displaced scaphoid fracture and as we discussed at the visit this has a high likelihood of healing well with the cast only and not needing surgery. But we will need to follow-up with serial x-rays to ensure this. Can we please schedule an appointment for him in 4 weeks? We will remove the cast and repeat x-rays at that time.  In terms of the work note. His restrictions are as follows:  - Able to perform activity with the right hand as able as long as remaining in his wrist cast - 15 lb weight lifting restriction for the right upper extremity only  Dr. Rolena Infante ----- Message ----- From: Otelia Sergeant, LAT Sent: 06/13/2022   8:01 AM EDT To: Elba Barman, DO Subject: FW: SECURE: Needing work status note please #   ----- Message ----- From: Deboraha Sprang Sent: 06/10/2022  10:54 AM EDT To: Otelia Sergeant, LAT Subject: SECURE: Needing work status note please for #  Lilia Pro  Needing a work note for date of service 06-08-22 visit please. Thx Tisha

## 2022-06-23 ENCOUNTER — Other Ambulatory Visit: Payer: Self-pay

## 2022-06-23 ENCOUNTER — Other Ambulatory Visit: Payer: Self-pay | Admitting: Internal Medicine

## 2022-06-23 DIAGNOSIS — I5030 Unspecified diastolic (congestive) heart failure: Secondary | ICD-10-CM

## 2022-06-23 DIAGNOSIS — E1169 Type 2 diabetes mellitus with other specified complication: Secondary | ICD-10-CM

## 2022-06-23 DIAGNOSIS — I10 Essential (primary) hypertension: Secondary | ICD-10-CM

## 2022-06-23 MED ORDER — HYDROCHLOROTHIAZIDE 25 MG PO TABS
25.0000 mg | ORAL_TABLET | Freq: Every day | ORAL | 0 refills | Status: DC
  Filled 2022-06-23: qty 30, 30d supply, fill #0

## 2022-06-23 MED ORDER — DAPAGLIFLOZIN PROPANEDIOL 10 MG PO TABS
10.0000 mg | ORAL_TABLET | Freq: Every day | ORAL | 0 refills | Status: DC
  Filled 2022-06-23: qty 30, 30d supply, fill #0

## 2022-06-23 MED ORDER — HYDRALAZINE HCL 100 MG PO TABS
100.0000 mg | ORAL_TABLET | Freq: Three times a day (TID) | ORAL | 0 refills | Status: DC
  Filled 2022-06-23: qty 90, 30d supply, fill #0

## 2022-06-23 MED ORDER — LOSARTAN POTASSIUM 100 MG PO TABS
100.0000 mg | ORAL_TABLET | Freq: Every day | ORAL | 0 refills | Status: DC
  Filled 2022-06-23: qty 30, 30d supply, fill #0

## 2022-06-23 MED ORDER — METFORMIN HCL 1000 MG PO TABS
1000.0000 mg | ORAL_TABLET | Freq: Two times a day (BID) | ORAL | 0 refills | Status: DC
  Filled 2022-06-23: qty 60, 30d supply, fill #0

## 2022-06-23 MED ORDER — FUROSEMIDE 20 MG PO TABS
20.0000 mg | ORAL_TABLET | Freq: Every day | ORAL | 0 refills | Status: DC
  Filled 2022-06-23: qty 30, 30d supply, fill #0

## 2022-06-23 MED ORDER — AMLODIPINE BESYLATE 10 MG PO TABS
10.0000 mg | ORAL_TABLET | Freq: Every day | ORAL | 0 refills | Status: DC
  Filled 2022-06-23: qty 30, 30d supply, fill #0

## 2022-06-23 MED ORDER — CARVEDILOL 25 MG PO TABS
25.0000 mg | ORAL_TABLET | Freq: Two times a day (BID) | ORAL | 0 refills | Status: DC
  Filled 2022-06-23: qty 60, 30d supply, fill #0

## 2022-06-23 MED ORDER — ATORVASTATIN CALCIUM 40 MG PO TABS
40.0000 mg | ORAL_TABLET | Freq: Every day | ORAL | 0 refills | Status: DC
  Filled 2022-06-23: qty 30, 30d supply, fill #0

## 2022-06-23 NOTE — Telephone Encounter (Signed)
Requested medication (s) are due for refill today:   Yes for all 9  Requested medication (s) are on the active medication list:   Yes for all 9  Future visit scheduled:   No  LOV 11/30/2021   Was for follow up 03/2022 but no such visit noted   Last ordered: All 9 medications are due refills  Returned because he did not have the 4 month follow up for Aug. 2023 and he has several labs due per protocol.   Requested Prescriptions  Pending Prescriptions Disp Refills   carvedilol (COREG) 25 MG tablet 180 tablet 2    Sig: TAKE 1 TABLET (25 MG TOTAL) BY MOUTH 2 (TWO) TIMES DAILY WITH A MEAL.     Cardiovascular: Beta Blockers 3 Failed - 06/23/2022  8:20 AM      Failed - Cr in normal range and within 360 days    Creat  Date Value Ref Range Status  11/25/2015 1.05 0.60 - 1.35 mg/dL Final   Creatinine, Ser  Date Value Ref Range Status  05/10/2021 0.99 0.76 - 1.27 mg/dL Final         Failed - AST in normal range and within 360 days    AST  Date Value Ref Range Status  05/10/2021 11 0 - 40 IU/L Final         Failed - ALT in normal range and within 360 days    ALT  Date Value Ref Range Status  05/10/2021 18 0 - 44 IU/L Final         Failed - Last BP in normal range    BP Readings from Last 1 Encounters:  05/03/22 (!) 153/96         Failed - Valid encounter within last 6 months    Recent Outpatient Visits           6 months ago Type 2 diabetes mellitus with morbid obesity ([place])   [place], Neoma Laming B, MD   9 months ago Type 2 diabetes mellitus with morbid obesity [facility])   [place], Deborah B, MD   1 year ago Type 2 diabetes mellitus with morbid obesity [facility])   [place], Deborah B, MD   1 year ago Type 2 diabetes mellitus with morbid obesity [facility])   [place], [place], [place]   2 years ago Type 2 diabetes mellitus with  morbid obesity [facility])   [place] Ladell Pier, MD       Future Appointments             In 2 weeks Elba Barman, DO [place] - Last Heart Rate in normal range    Pulse Readings from Last 1 Encounters:  05/03/22 90          metFORMIN (GLUCOPHAGE) 1000 MG tablet 180 tablet 2    Sig: TAKE 1 TABLET (1,000 MG TOTAL) BY MOUTH 2 (TWO) TIMES DAILY WITH A MEAL.     Endocrinology:  Diabetes - Biguanides Failed - 06/23/2022  8:20 AM      Failed - Cr in normal range and within 360 days    Creat  Date Value Ref Range Status  11/25/2015 1.05 0.60 - 1.35 mg/dL Final   Creatinine, Ser  Date Value Ref Range Status  05/10/2021 0.99 0.76 - 1.27 mg/dL Final         Failed - HBA1C is between 0 and 7.9 and within 180 days    HbA1c, POC (controlled diabetic range)  Date Value Ref Range Status  11/30/2021 8.5 (A) 0.0 - 7.0 % Final         Failed - eGFR in normal range and within 360 days    GFR calc Af Amer  Date Value Ref Range Status  06/12/2020 98 >59 mL/min/1.73 Final    Comment:    **In accordance with recommendations from the NKF-ASN Task force,**   [facility] is in the process of updating its eGFR calculation to the   2021 CKD-EPI creatinine equation that estimates kidney function   without a race variable.    GFR calc non Af Amer  Date Value Ref Range Status  06/12/2020 85 >59 mL/min/1.73 Final   eGFR  Date Value Ref Range Status  05/10/2021 103 >59 mL/min/1.73 Final         Failed - B12 Level in normal range and within 720 days    Vitamin B-12  Date Value Ref Range Status  12/31/2017 327 180 - 914 pg/mL Final    Comment:    (NOTE) This assay is not validated for testing neonatal or myeloproliferative syndrome specimens for Vitamin B12 levels. Performed at [facility], [place] [address] [place], [place] [address]          Failed - Valid encounter within last 6 months    Recent  Outpatient Visits           6 months ago Type 2 diabetes mellitus with morbid obesity ([place])   [place] Karle Plumber B, MD   9 months ago Type 2 diabetes mellitus with morbid obesity ([place])   [place] Ladell Pier, MD   1 year ago Type 2 diabetes mellitus with morbid obesity ([place])   [place] Ladell Pier, MD   1 year ago Type 2 diabetes mellitus with morbid obesity [facility])   [place], [place], [place]   2 years ago Type 2 diabetes mellitus with morbid obesity [facility])   [place], MD       Future Appointments             In 2 weeks Elba Barman, DO Normal            Failed - CBC within normal limits and completed in the last 12 months    WBC  Date Value Ref Range Status  05/10/2021 6.7 3.4 - 10.8 x10E3/uL Final  03/17/2019 9.3 4.0 - 10.5 K/uL Final   RBC  Date Value Ref Range Status  05/10/2021 6.09 (H) 4.14 - 5.80 x10E6/uL Final  03/17/2019 6.43 (H) 4.22 - 5.81 MIL/uL Final   Hemoglobin  Date Value Ref Range Status  05/10/2021 14.7 13.0 - 17.7 g/dL Final   Hematocrit  Date Value Ref Range Status  05/10/2021 43.4 37.5 - 51.0 % Final   MCHC  Date Value Ref Range Status  05/10/2021 33.9 31.5 - 35.7 g/dL Final  03/17/2019 34.8 30.0 - 36.0 g/dL Final   [facility]  Date Value Ref Range Status  05/10/2021 24.1 (L) 26.6 - 33.0 pg Final  03/17/2019 24.3 (L) 26.0 - 34.0 pg Final   MCV  Date Value Ref  Range Status  05/10/2021 71 (L) 79 - 97 fL Final   No results found for: "PLTCOUNTKUC", "LABPLAT", "POCPLA" RDW  Date Value Ref Range Status  05/10/2021 15.2 11.6 - 15.4 % Final          hydrALAZINE (APRESOLINE) 100 MG tablet 270 tablet 2    Sig: TAKE 1 TABLET (100 MG TOTAL) BY MOUTH 3 (THREE) TIMES DAILY.     Cardiovascular:  Vasodilators Failed -  06/23/2022  8:20 AM      Failed - HCT in normal range and within 360 days    Hematocrit  Date Value Ref Range Status  05/10/2021 43.4 37.5 - 51.0 % Final         Failed - HGB in normal range and within 360 days    Hemoglobin  Date Value Ref Range Status  05/10/2021 14.7 13.0 - 17.7 g/dL Final         Failed - RBC in normal range and within 360 days    RBC  Date Value Ref Range Status  05/10/2021 6.09 (H) 4.14 - 5.80 x10E6/uL Final  03/17/2019 6.43 (H) 4.22 - 5.81 MIL/uL Final         Failed - WBC in normal range and within 360 days    WBC  Date Value Ref Range Status  05/10/2021 6.7 3.4 - 10.8 x10E3/uL Final  03/17/2019 9.3 4.0 - 10.5 K/uL Final         Failed - PLT in normal range and within 360 days    Platelets  Date Value Ref Range Status  05/10/2021 224 150 - 450 x10E3/uL Final         Failed - ANA Screen, Ifa, Serum in normal range and within 360 days    No results found for: "ANA", "ANATITER", "LABANTI"       Failed - Last BP in normal range    BP Readings from Last 1 Encounters:  05/03/22 (!) 153/96         Passed - Valid encounter within last 12 months    Recent Outpatient Visits           6 months ago Type 2 diabetes mellitus with morbid obesity ([place])   [place], Neoma Laming B, MD   9 months ago Type 2 diabetes mellitus with morbid obesity ([place])   [place], Deborah B, MD   1 year ago Type 2 diabetes mellitus with morbid obesity [facility])   [place], Deborah B, MD   1 year ago Type 2 diabetes mellitus with morbid obesity [facility])   [place], [place], [place]   2 years ago Type 2 diabetes mellitus with morbid obesity [facility])   [place], MD       Future Appointments             In 2 weeks Elba Barman, DO [facility]              hydrochlorothiazide (HYDRODIURIL) 25 MG tablet 30 tablet 11    Sig: TAKE 1 TABLET (25 MG TOTAL) BY MOUTH DAILY.     Cardiovascular: Diuretics - Thiazide Failed - 06/23/2022  8:20 AM      Failed - Cr in normal range and within 180 days    Creat  Date Value Ref Range Status  11/25/2015 1.05 0.60 - 1.35 mg/dL Final  Creatinine, Ser  Date Value Ref Range Status  05/10/2021 0.99 0.76 - 1.27 mg/dL Final         Failed - K in normal range and within 180 days    Potassium  Date Value Ref Range Status  05/10/2021 4.0 3.5 - 5.2 mmol/L Final         Failed - Na in normal range and within 180 days    Sodium  Date Value Ref Range Status  05/10/2021 138 134 - 144 mmol/L Final         Failed - Last BP in normal range    BP Readings from Last 1 Encounters:  05/03/22 (!) 153/96         Failed - Valid encounter within last 6 months    Recent Outpatient Visits           6 months ago Type 2 diabetes mellitus with morbid obesity ([place])   [place] Karle Plumber B, MD   9 months ago Type 2 diabetes mellitus with morbid obesity [facility])   [place] Karle Plumber B, MD   1 year ago Type 2 diabetes mellitus with morbid obesity [facility])   [place], Deborah B, MD   1 year ago Type 2 diabetes mellitus with morbid obesity [facility])   [place], [place], [place]   2 years ago Type 2 diabetes mellitus with morbid obesity [facility])   [place], MD       Future Appointments             In 2 weeks Elba Barman, DO [place]             amLODipine (NORVASC) 10 MG tablet 90 tablet 0    Sig: TAKE 1 TABLET (10 MG TOTAL) BY MOUTH DAILY.     Cardiovascular: Calcium Channel Blockers 2 Failed - 06/23/2022  8:20 AM      Failed - Last BP in normal range    BP Readings from Last 1 Encounters:   05/03/22 (!) 153/96         Failed - Valid encounter within last 6 months    Recent Outpatient Visits           6 months ago Type 2 diabetes mellitus with morbid obesity ([place])   [place] Karle Plumber B, MD   9 months ago Type 2 diabetes mellitus with morbid obesity [facility])   [place], Deborah B, MD   1 year ago Type 2 diabetes mellitus with morbid obesity [facility])   [place], Deborah B, MD   1 year ago Type 2 diabetes mellitus with morbid obesity [facility])   [place], [place], [place]   2 years ago Type 2 diabetes mellitus with morbid obesity [facility])   [place], MD       Future Appointments             In 2 weeks Elba Barman, DO [place] in normal range    Pulse Readings from Last 1 Encounters:  05/03/22 90  atorvastatin (LIPITOR) 40 MG tablet 90 tablet 0    Sig: TAKE 1 TABLET (40 MG TOTAL) BY MOUTH DAILY AT 6 PM.     Cardiovascular:  Antilipid - Statins Failed - 06/23/2022  8:20 AM      Failed - Lipid Panel in normal range within the last 12 months    Cholesterol, Total  Date Value Ref Range Status  05/10/2021 149 100 - 199 mg/dL Final   LDL Chol Calc (NIH)  Date Value Ref Range Status  05/10/2021 73 0 - 99 mg/dL Final   HDL  Date Value Ref Range Status  05/10/2021 27 (L) >39 mg/dL Final   Triglycerides  Date Value Ref Range Status  05/10/2021 302 (H) 0 - 149 mg/dL Final         Passed - Patient is not pregnant      Passed - Valid encounter within last 12 months    Recent Outpatient Visits           6 months ago Type 2 diabetes mellitus with morbid obesity ([place])   [place], Neoma Laming B, MD   9 months ago Type 2 diabetes mellitus with morbid obesity  ([place])   [place], Deborah B, MD   1 year ago Type 2 diabetes mellitus with morbid obesity ([place])   [place], Deborah B, MD   1 year ago Type 2 diabetes mellitus with morbid obesity [facility])   [place], [place], [place]   2 years ago Type 2 diabetes mellitus with morbid obesity [facility])   [place] Ladell Pier, MD       Future Appointments             In 2 weeks Elba Barman, DO [place]             dapagliflozin propanediol (FARXIGA) 10 MG TABS tablet 90 tablet 0    Sig: TAKE 1 TABLET (10 MG TOTAL) BY MOUTH DAILY.     Endocrinology:  Diabetes - SGLT2 Inhibitors Failed - 06/23/2022  8:20 AM      Failed - Cr in normal range and within 360 days    Creat  Date Value Ref Range Status  11/25/2015 1.05 0.60 - 1.35 mg/dL Final   Creatinine, Ser  Date Value Ref Range Status  05/10/2021 0.99 0.76 - 1.27 mg/dL Final         Failed - HBA1C is between 0 and 7.9 and within 180 days    HbA1c, POC (controlled diabetic range)  Date Value Ref Range Status  11/30/2021 8.5 (A) 0.0 - 7.0 % Final         Failed - eGFR in normal range and within 360 days    GFR calc Af Amer  Date Value Ref Range Status  06/12/2020 98 >59 mL/min/1.73 Final    Comment:    **In accordance with recommendations from the NKF-ASN Task force,**   [facility] is in the process of updating its eGFR calculation to the   2021 CKD-EPI creatinine equation that estimates kidney function   without a race variable.    GFR calc non Af Amer  Date Value Ref Range Status  06/12/2020 85 >59 mL/min/1.73 Final   eGFR  Date Value Ref Range Status  05/10/2021 103 >59 mL/min/1.73 Final         Failed - Valid  encounter within last 6 months    Recent Outpatient Visits           6 months ago Type 2 diabetes mellitus with morbid obesity ([place])   [place] Karle Plumber B, MD   9 months ago Type 2 diabetes mellitus with morbid obesity [facility])   [place] Ladell Pier, MD   1 year ago Type 2 diabetes mellitus with morbid obesity [facility])   [place], Deborah B, MD   1 year ago Type 2 diabetes mellitus with morbid obesity [facility])   [place], [place], [place]   2 years ago Type 2 diabetes mellitus with morbid obesity [facility])   [place], MD       Future Appointments             In 2 weeks Elba Barman, DO [place]             furosemide (LASIX) 20 MG tablet 90 tablet 0    Sig: TAKE 1 TABLET (20 MG TOTAL) BY MOUTH DAILY.     Cardiovascular:  Diuretics - Loop Failed - 06/23/2022  8:20 AM      Failed - K in normal range and within 180 days    Potassium  Date Value Ref Range Status  05/10/2021 4.0 3.5 - 5.2 mmol/L Final         Failed - Ca in normal range and within 180 days    Calcium  Date Value Ref Range Status  05/10/2021 9.2 8.7 - 10.2 mg/dL Final         Failed - Na in normal range and within 180 days    Sodium  Date Value Ref Range Status  05/10/2021 138 134 - 144 mmol/L Final         Failed - Cr in normal range and within 180 days    Creat  Date Value Ref Range Status  11/25/2015 1.05 0.60 - 1.35 mg/dL Final   Creatinine, Ser  Date Value Ref Range Status  05/10/2021 0.99 0.76 - 1.27 mg/dL Final         Failed - Cl in normal range and within 180 days    Chloride  Date Value Ref Range Status  05/10/2021 102 96 - 106 mmol/L Final         Failed - Mg Level in normal range and within 180 days    Magnesium  Date Value Ref Range Status  09/12/2017 1.8 1.7 - 2.4 mg/dL Final         Failed - Last BP in normal range    BP Readings from Last 1 Encounters:  05/03/22 (!) 153/96         Failed - Valid  encounter within last 6 months    Recent Outpatient Visits           6 months ago Type 2 diabetes mellitus with morbid obesity ([place])   [place] Karle Plumber B, MD   9 months ago Type 2 diabetes mellitus with morbid obesity [facility])   [place] Ladell Pier, MD   1 year ago Type 2 diabetes mellitus with morbid obesity [facility])   [place] Ladell Pier, MD   1 year ago Type 2 diabetes mellitus with morbid  ob[facility])   [place], [place], [place]   2 years ago Type 2 diabetes mellitus with morbid obesity [facility])   [place], MD       Future Appointments             In 2 weeks Elba Barman, DO [place]             losartan (COZAAR) 100 MG tablet 90 tablet 0    Sig: TAKE 1 TABLET (100 MG TOTAL) BY MOUTH DAILY.     Cardiovascular:  Angiotensin Receptor Blockers Failed - 06/23/2022  8:20 AM      Failed - Cr in normal range and within 180 days    Creat  Date Value Ref Range Status  11/25/2015 1.05 0.60 - 1.35 mg/dL Final   Creatinine, Ser  Date Value Ref Range Status  05/10/2021 0.99 0.76 - 1.27 mg/dL Final         Failed - K in normal range and within 180 days    Potassium  Date Value Ref Range Status  05/10/2021 4.0 3.5 - 5.2 mmol/L Final         Failed - Last BP in normal range    BP Readings from Last 1 Encounters:  05/03/22 (!) 153/96         Failed - Valid encounter within last 6 months    Recent Outpatient Visits           6 months ago Type 2 diabetes mellitus with morbid obesity ([place])   [place] Karle Plumber B, MD   9 months ago Type 2 diabetes mellitus with morbid obesity ([place])   [place], Deborah B, MD   1 year ago Type 2 diabetes mellitus with morbid obesity [facility])    [place], Deborah B, MD   1 year ago Type 2 diabetes mellitus with morbid obesity [facility] [place] [facility])   [place], [place], [place]   2 years ago Type 2 diabetes mellitus with morbid obesity [facility])   [place], MD       Future Appointments             In 2 weeks Elba Barman, [place] - Patient is not pregnant

## 2022-06-27 ENCOUNTER — Other Ambulatory Visit: Payer: Self-pay

## 2022-07-13 ENCOUNTER — Ambulatory Visit (INDEPENDENT_AMBULATORY_CARE_PROVIDER_SITE_OTHER): Payer: Worker's Compensation

## 2022-07-13 ENCOUNTER — Encounter: Payer: Self-pay | Admitting: Sports Medicine

## 2022-07-13 ENCOUNTER — Ambulatory Visit (INDEPENDENT_AMBULATORY_CARE_PROVIDER_SITE_OTHER): Payer: Worker's Compensation | Admitting: Sports Medicine

## 2022-07-13 DIAGNOSIS — M25531 Pain in right wrist: Secondary | ICD-10-CM

## 2022-07-13 DIAGNOSIS — S52551D Other extraarticular fracture of lower end of right radius, subsequent encounter for closed fracture with routine healing: Secondary | ICD-10-CM | POA: Diagnosis not present

## 2022-07-13 DIAGNOSIS — S62024D Nondisplaced fracture of middle third of navicular [scaphoid] bone of right wrist, subsequent encounter for fracture with routine healing: Secondary | ICD-10-CM | POA: Diagnosis not present

## 2022-07-13 NOTE — Progress Notes (Signed)
Paul Lamb - 36 y.o. male MRN [id number]  Date of birth: [birth year]  Office Visit Note: Visit Date: 07/13/2022 PCP: Marcine Matar, MD Referred by: Marcine Matar, MD  Subjective: Chief Complaint  Patient presents with   Right Wrist - Fracture, Follow-up   HPI: Paul Lamb is a pleasant 36 y.o. male who presents today for follow-up of right distal radius fracture and middle third scaphoid fracture.   - Initial DOI: 05/02/22  - Placed in Thumb spica cast 05/10/22  *10-weeks from injury *9-weeks from thumb spica  Today Paul Lamb states that he is doing well.  He has maintained in the thumb spica cast.  Occasionally get a little bit of pain proximal scaphoid or distal radius but not very bothersome to him.  He is not taking any medication.  Reports his diabetic control has been up and down.  Has been working in the thumb spica cast with appropriate restrictions of 15 pounds max lift for RUE only. He is anxious to get out of the cast.  Pertinent ROS were reviewed with the patient and found to be negative unless otherwise specified above in HPI.   Assessment & Plan: Visit Diagnoses:  1. Closed nondisplaced fracture of middle third of scaphoid of right wrist with routine healing, subsequent encounter   2. Pain in right wrist   3. Other closed extra-articular fracture of distal end of right radius with routine healing, subsequent encounter    Plan: Discussed with Paul Lamb the nature of both his scaphoid and distal radius fracture.  He does have bony healing of both of these, although the radius fracture is slightly behind what I would expect at this time point.  Did discuss with him working hard to control his diabetes and glucose levels to help promote healing.  He has not had full immobilization yet for the scaphoid fracture, will place him back in a thumb spica cast for an additional 2 weeks.  We will follow-up at that time with repeat x-rays out of the cast.  Hopefully at  this point if he has continued radiographic healing we will be able to transition him to a thumb spica brace and start occupational therapy for the hand and wrist.  Note provided for work with activities to remain in the cast and continued max 15 pound lifting restriction for RUE only.   Follow-up: Return in about 2 weeks (around 07/27/2022) for with Dr. Shon Baton for wrist f/u .   Meds & Orders: No orders of the defined types were placed in this encounter.   Orders Placed This Encounter  Procedures   XR Wrist Complete Right     Procedures: No procedures performed      Clinical History: No specialty comments available.  He reports that he has never smoked. He has never used smokeless tobacco.  Recent Labs    09/13/21 1116 11/30/21 1121  HGBA1C 8.8* 8.5*    Objective:    Physical Exam  Gen: Well-appearing, in no acute distress; non-toxic CV: Regular Rate. Well-perfused. Warm.  Resp: Breathing unlabored on room air; no wheezing. Psych: Fluid speech in conversation; appropriate affect; normal thought process Neuro: Sensation intact throughout. No gross coordination deficits.   Ortho Exam - Right wrist: The wrist and hand was evaluated out of the thumb spica cast.  There is some dry skin noted as well as some relative atrophy of the wrist extensor musculature as to be expected.  He can wiggle all 5 fingers without difficulty.  There is about 20 degrees of loss of flexion and 15 degrees of loss of extension.  Neurovascular intact distally.  No true bony TTP at this point.  Imaging: XR Wrist Complete Right  Result Date: 07/13/2022 4 views of the right wrist including AP, oblique, lateral and ulnar deviated views were ordered and reviewed by myself.  X-rays demonstrate a healing middle one third scaphoid waist fracture with some bony healing, has not completed union yet.  X-rays redemonstrate an oblique nondisplaced fracture of the distal radial diametaphysis, this does have healing,  although not completely united in the middle third of the fracture.  Signs of healing, although slightly delayed radius fracture.   Past Medical/Family/Surgical/Social History: Medications & Allergies reviewed per EMR, new medications updated. Patient Active Problem List   Diagnosis Date Noted   Diabetes mellitus type 2 in obese (HCC) 12/29/2017   Diastolic congestive heart failure (HCC) 11/28/2017   Proteinuria    Erectile dysfunction 05/19/2017   Depression 05/19/2017   Essential hypertension 01/12/2016   Sleep apnea 01/12/2016   Abnormal EKG    Hand pain, left 11/01/2015   Morbid obesity (HCC) 11/01/2015   Past Medical History:  Diagnosis Date   Acute combined systolic and diastolic CHF, NYHA class 3 (HCC) 11/18/2016   EF now 40-45% by echo   Acute diastolic (congestive) heart failure (HCC) 01/12/2016   Diabetes mellitus, new onset (HCC)    Hypertension    Morbid obesity (HCC)    OSA (obstructive sleep apnea)    Family History  Problem Relation Age of Onset   Hypertension Other    Diabetes Mellitus II Other    Past Surgical History:  Procedure Laterality Date   NO PAST SURGERIES     Social History   Occupational History   Not on file  Tobacco Use   Smoking status: Never   Smokeless tobacco: Never  Vaping Use   Vaping Use: Never used  Substance and Sexual Activity   Alcohol use: Yes    Alcohol/week: 0.0 standard drinks of alcohol   Drug use: No   Sexual activity: Yes

## 2022-07-25 ENCOUNTER — Other Ambulatory Visit: Payer: Self-pay | Admitting: Sports Medicine

## 2022-07-25 DIAGNOSIS — S52551D Other extraarticular fracture of lower end of right radius, subsequent encounter for closed fracture with routine healing: Secondary | ICD-10-CM

## 2022-07-25 DIAGNOSIS — S62024D Nondisplaced fracture of middle third of navicular [scaphoid] bone of right wrist, subsequent encounter for fracture with routine healing: Secondary | ICD-10-CM

## 2022-07-26 ENCOUNTER — Ambulatory Visit (INDEPENDENT_AMBULATORY_CARE_PROVIDER_SITE_OTHER): Payer: Worker's Compensation

## 2022-07-26 ENCOUNTER — Encounter: Payer: Self-pay | Admitting: Sports Medicine

## 2022-07-26 ENCOUNTER — Ambulatory Visit (INDEPENDENT_AMBULATORY_CARE_PROVIDER_SITE_OTHER): Payer: Worker's Compensation | Admitting: Sports Medicine

## 2022-07-26 DIAGNOSIS — M25531 Pain in right wrist: Secondary | ICD-10-CM

## 2022-07-26 DIAGNOSIS — S52551D Other extraarticular fracture of lower end of right radius, subsequent encounter for closed fracture with routine healing: Secondary | ICD-10-CM | POA: Diagnosis not present

## 2022-07-26 DIAGNOSIS — S62024D Nondisplaced fracture of middle third of navicular [scaphoid] bone of right wrist, subsequent encounter for fracture with routine healing: Secondary | ICD-10-CM

## 2022-07-26 NOTE — Patient Instructions (Addendum)
[place]:  -You are to wear the thumb spica brace with any lifting or manual labor/physical activity.  You may take this off if you are just resting, do not need to wear this to sleep. -You will have a 20 pound max lift weight restriction for upper extremity  *Follow-up with me in 1 month  - Dr. Shon Baton

## 2022-07-26 NOTE — Progress Notes (Signed)
Paul Lamb - 36 y.o. male MRN [id number]  Date of birth: 1986/08/10  Office Visit Note: Visit Date: 07/26/2022 PCP: Paul Matar, MD Referred by: Paul Matar, MD  Subjective: Chief Complaint  Patient presents with   Right Wrist - Pain, Follow-up   HPI: Paul Lamb is a pleasant 36 y.o. male who presents today for follow-up of right distal radius fracture and middle waist scaphoid fracture.    - Initial DOI: 05/02/22  - Placed in Thumb spica cast 05/10/22  Paul Lamb has been doing well.  Reports his pain is essentially none.  He does have some stiffness in the wrist and thumb to be expected.  Has been able to do his job without difficulty.  He is adhering to the lifting restrictions.  Pertinent ROS were reviewed with the patient and found to be negative unless otherwise specified above in HPI.   Assessment & Plan: Visit Diagnoses:  1. Closed nondisplaced fracture of middle third of scaphoid of right wrist with routine healing, subsequent encounter   2. Pain in right wrist   3. Other closed extra-articular fracture of distal end of right radius with routine healing, subsequent encounter    Plan: Discussed with Paul Lamb today that he can transition out of the thumb spica cast.  He does have a well-healed radius fracture and a healing scaphoid fracture.  For protection of the scaphoid fracture specifically, I would like him to wear a thumb spica brace with any lifting or manual labor/physical activity.  Outside of this he may completely remove this.  I did give him some rest range of motion and gentle strengthening exercises for him to begin on his own, although I feel it is imperative for him to get set up with occupational therapy to improve his stiffness and regain his muscle strength and wrist/hand dexterity.  I did have him meet our occupational therapist, Paul Lamb, today to build a relationship.  He still needs approval from his [employer]. insurance.  He will  follow-up with me in 1 month for repeat x-rays and I will recheck on the fractures and how he is doing with rehab.  Note provided for work.  Still has a 25 lb max lift restriction for the upper extremity.  Follow-up: Return in 4 weeks (on 08/23/2022) for with Dr. Shon Lamb with repeat x-rays of wrist.   Meds & Orders: No orders of the defined types were placed in this encounter.   Orders Placed This Encounter  Procedures   XR Wrist Complete Right     Procedures: No procedures performed      Clinical History: No specialty comments available.  He reports that he has never smoked. He has never used smokeless tobacco.  Recent Labs    09/13/21 1116 11/30/21 1121  HGBA1C 8.8* 8.5*    Objective:   Vital Signs: There were no vitals taken for this visit.  Physical Exam  Gen: Well-appearing, in no acute distress; non-toxic CV: Regular Rate. Well-perfused. Warm.  Resp: Breathing unlabored on room air; no wheezing. Psych: Fluid speech in conversation; appropriate affect; normal thought process Neuro: Sensation intact throughout. No gross coordination deficits.   Ortho Exam -Right wrist: The wrist and hand was evaluated out in the thumb spica cast.  There is some skin xerosis noted.  There is some atrophy of the wrist extensors as to be expected.  He can wiggle all 5 fingers without difficulty, some limited stiffness at the [facility] and MCP joint of the thumb.  There is no bony tenderness within the anatomical snuffbox or scaphoid tubercle.  No bony TTP over the radius. NVI.  Imaging: XR Wrist Complete Right  Result Date: 07/26/2022 4 views of the right wrist including AP, oblique, lateral and tunnel views were ordered and reviewed by myself.  X-rays demonstrate a well-healing oblique, nondisplaced fracture of the distal radial diametaphysis with callus formation.  There is a middle one third scaphoid waist fracture with bony healing, there is some callus formation although a faint lucency on the  radial aspect of the scaphoid fracture.  There is no dorsal or volar angulation malalignment on lateral view.   Past Medical/Family/Surgical/Social History: Medications & Allergies reviewed per EMR, new medications updated. Patient Active Problem List   Diagnosis Date Noted   Diabetes mellitus type 2 in obese (HCC) 12/29/2017   Diastolic congestive heart failure (HCC) 11/28/2017   Proteinuria    Erectile dysfunction 05/19/2017   Depression 05/19/2017   Essential hypertension 01/12/2016   Sleep apnea 01/12/2016   Abnormal EKG    Hand pain, left 11/01/2015   Morbid obesity (HCC) 11/01/2015   Past Medical History:  Diagnosis Date   Acute combined systolic and diastolic CHF, NYHA class 3 (HCC) 11/18/2016   EF now 40-45% by echo   Acute diastolic (congestive) heart failure (HCC) 01/12/2016   Diabetes mellitus, new onset (HCC)    Hypertension    Morbid obesity (HCC)    OSA (obstructive sleep apnea)    Family History  Problem Relation Age of Onset   Hypertension Other    Diabetes Mellitus II Other    Past Surgical History:  Procedure Laterality Date   NO PAST SURGERIES     Social History   Occupational History   Not on file  Tobacco Use   Smoking status: Never   Smokeless tobacco: Never  Vaping Use   Vaping Use: Never used  Substance and Sexual Activity   Alcohol use: Yes    Alcohol/week: 0.0 standard drinks of alcohol   Drug use: No   Sexual activity: Yes

## 2022-08-16 ENCOUNTER — Other Ambulatory Visit: Payer: Self-pay

## 2022-08-28 ENCOUNTER — Other Ambulatory Visit: Payer: Self-pay | Admitting: Internal Medicine

## 2022-08-28 DIAGNOSIS — I10 Essential (primary) hypertension: Secondary | ICD-10-CM

## 2022-08-28 DIAGNOSIS — E1169 Type 2 diabetes mellitus with other specified complication: Secondary | ICD-10-CM

## 2022-08-29 ENCOUNTER — Other Ambulatory Visit: Payer: Self-pay

## 2022-08-29 NOTE — Telephone Encounter (Signed)
Requested medications are due for refill today.  yes  Requested medications are on the active medications list.  yes  Last refill. 06/23/2022 #30 0 rf  - courtesy refills  Future visit scheduled.   no  Notes to clinic.  Labs are expired. Pt already given a courtesy refill. Pt is 3 months overdue for ov.    Requested Prescriptions  Pending Prescriptions Disp Refills   dapagliflozin propanediol (FARXIGA) 10 MG TABS tablet 30 tablet 0    Sig: Take 1 tablet (10 mg total) by mouth daily.     Endocrinology:  Diabetes - SGLT2 Inhibitors Failed - 08/28/2022 10:55 AM      Failed - Cr in normal range and within 360 days    Creat  Date Value Ref Range Status  11/25/2015 1.05 0.60 - 1.35 mg/dL Final   Creatinine, Ser  Date Value Ref Range Status  05/10/2021 0.99 0.76 - 1.27 mg/dL Final         Failed - HBA1C is between 0 and 7.9 and within 180 days    HbA1c, POC (controlled diabetic range)  Date Value Ref Range Status  11/30/2021 8.5 (A) 0.0 - 7.0 % Final         Failed - eGFR in normal range and within 360 days    GFR calc Af Amer  Date Value Ref Range Status  06/12/2020 98 >59 mL/min/1.73 Final    Comment:    **In accordance with recommendations from the NKF-ASN Task force,**   [facility] is in the process of updating its eGFR calculation to the   2021 CKD-EPI creatinine equation that estimates kidney function   without a race variable.    GFR calc non Af Amer  Date Value Ref Range Status  06/12/2020 85 >59 mL/min/1.73 Final   eGFR  Date Value Ref Range Status  05/10/2021 103 >59 mL/min/1.73 Final         Failed - Valid encounter within last 6 months    Recent Outpatient Visits           9 months ago Type 2 diabetes mellitus with morbid obesity (HCC)   [place] [facility] [name] B, MD   11 months ago Type 2 diabetes mellitus with morbid obesity [facility])   [place] Marcine Matar, MD   1 year ago Type 2  diabetes mellitus with morbid obesity [facility])   [place] [facility] [name] B, MD   1 year ago Type 2 diabetes mellitus with morbid obesity [facility])   [place] [facility] [place], [place], [place]   2 years ago Type 2 diabetes mellitus with morbid obesity [facility])   [place] [facility] Marcine Matar, MD       Future Appointments             In 2 weeks Madelyn Brunner, DO [facility]             losartan (COZAAR) 100 MG tablet 30 tablet 0    Sig: Take 1 tablet (100 mg total) by mouth daily.     Cardiovascular:  Angiotensin Receptor Blockers Failed - 08/28/2022 10:55 AM      Failed - Cr in normal range and within 180 days    Creat  Date Value Ref Range Status  11/25/2015 1.05 0.60 - 1.35 mg/dL Final   Creatinine, Ser  Date Value Ref Range Status  05/10/2021 0.99  0.76 - 1.27 mg/dL Final         Failed - K in normal range and within 180 days    Potassium  Date Value Ref Range Status  05/10/2021 4.0 3.5 - 5.2 mmol/L Final         Failed - Last BP in normal range    BP Readings from Last 1 Encounters:  05/03/22 (!) 153/96         Failed - Valid encounter within last 6 months    Recent Outpatient Visits           9 months ago Type 2 diabetes mellitus with morbid obesity ([place])   [place] Karle Plumber B, MD   11 months ago Type 2 diabetes mellitus with morbid obesity [facility])   [place], Deborah B, MD   1 year ago Type 2 diabetes mellitus with morbid obesity [facility])   [place], [place]   1 year ago Type 2 diabetes mellitus with morbid obesity [facility])   [place], [place], [place]   2 years ago Type 2 diabetes mellitus with morbid obesity [facility])   [place], MD       Future  Appointments             In 2 weeks Elba Barman, [place] - Patient is not pregnant

## 2022-08-29 NOTE — Telephone Encounter (Signed)
Requested medications are due for refill today.  yes  Requested medications are on the active medications list.  yes  Last refill. 01/17/2022 #15 1 rf  Future visit scheduled.   no  Notes to clinic.  Labs are expired.    Requested Prescriptions  Pending Prescriptions Disp Refills   sildenafil (VIAGRA) 100 MG tablet [Pharmacy Med Name: SILDENAFIL 100 MG TABLET] 15 tablet 1    Sig: TAKE 1 TABLET BY MOUTH HALF TO ONE HOUR PRIOR TO INTERCOURSE AS NEEDED     Urology: Erectile Dysfunction Agents Failed - 08/28/2022 11:16 AM      Failed - AST in normal range and within 360 days    AST  Date Value Ref Range Status  05/10/2021 11 0 - 40 IU/L Final         Failed - ALT in normal range and within 360 days    ALT  Date Value Ref Range Status  05/10/2021 18 0 - 44 IU/L Final         Failed - Last BP in normal range    BP Readings from Last 1 Encounters:  05/03/22 (!) 153/96         Passed - Valid encounter within last 12 months    Recent Outpatient Visits           9 months ago Type 2 diabetes mellitus with morbid obesity ([place])   [place], Neoma Laming B, MD   11 months ago Type 2 diabetes mellitus with morbid obesity ([place])   [place], Deborah B, MD   1 year ago Type 2 diabetes mellitus with morbid obesity [facility])   [place], Deborah B, MD   1 year ago Type 2 diabetes mellitus with morbid obesity [facility])   [place], [place], [place]   2 years ago Type 2 diabetes mellitus with morbid obesity [facility])   [place], MD       Future Appointments             In 2 weeks Elba Barman, [place]

## 2022-09-01 ENCOUNTER — Other Ambulatory Visit: Payer: Self-pay | Admitting: Internal Medicine

## 2022-09-01 ENCOUNTER — Encounter: Payer: Self-pay | Admitting: Internal Medicine

## 2022-09-01 DIAGNOSIS — E1169 Type 2 diabetes mellitus with other specified complication: Secondary | ICD-10-CM

## 2022-09-01 MED ORDER — DAPAGLIFLOZIN PROPANEDIOL 10 MG PO TABS
10.0000 mg | ORAL_TABLET | Freq: Every day | ORAL | 0 refills | Status: DC
  Filled 2022-09-01: qty 30, 30d supply, fill #0

## 2022-09-01 MED ORDER — SILDENAFIL CITRATE 100 MG PO TABS
ORAL_TABLET | ORAL | 1 refills | Status: DC
  Filled 2022-09-01: qty 10, 30d supply, fill #0
  Filled 2022-10-11: qty 10, 30d supply, fill #1
  Filled 2022-12-09: qty 10, 30d supply, fill #2

## 2022-09-02 ENCOUNTER — Other Ambulatory Visit: Payer: Self-pay | Admitting: Internal Medicine

## 2022-09-02 ENCOUNTER — Other Ambulatory Visit: Payer: Self-pay

## 2022-09-02 DIAGNOSIS — I10 Essential (primary) hypertension: Secondary | ICD-10-CM

## 2022-09-02 DIAGNOSIS — E1169 Type 2 diabetes mellitus with other specified complication: Secondary | ICD-10-CM

## 2022-09-02 DIAGNOSIS — I5030 Unspecified diastolic (congestive) heart failure: Secondary | ICD-10-CM

## 2022-09-02 MED ORDER — FUROSEMIDE 20 MG PO TABS
20.0000 mg | ORAL_TABLET | Freq: Every day | ORAL | 0 refills | Status: DC
  Filled 2022-09-02: qty 30, 30d supply, fill #0

## 2022-09-02 MED ORDER — GLIPIZIDE 5 MG PO TABS
5.0000 mg | ORAL_TABLET | Freq: Two times a day (BID) | ORAL | 0 refills | Status: DC
  Filled 2022-09-02: qty 60, 30d supply, fill #0

## 2022-09-02 MED ORDER — AMLODIPINE BESYLATE 10 MG PO TABS
10.0000 mg | ORAL_TABLET | Freq: Every day | ORAL | 0 refills | Status: DC
  Filled 2022-09-02: qty 30, 30d supply, fill #0

## 2022-09-02 MED ORDER — LOSARTAN POTASSIUM 100 MG PO TABS
100.0000 mg | ORAL_TABLET | Freq: Every day | ORAL | 0 refills | Status: DC
  Filled 2022-09-02: qty 30, 30d supply, fill #0

## 2022-09-02 MED ORDER — HYDRALAZINE HCL 100 MG PO TABS
100.0000 mg | ORAL_TABLET | Freq: Three times a day (TID) | ORAL | 0 refills | Status: DC
  Filled 2022-09-02: qty 90, 30d supply, fill #0

## 2022-09-02 MED ORDER — ATORVASTATIN CALCIUM 40 MG PO TABS
40.0000 mg | ORAL_TABLET | Freq: Every day | ORAL | 0 refills | Status: DC
  Filled 2022-09-02: qty 30, 30d supply, fill #0

## 2022-09-02 MED ORDER — CARVEDILOL 25 MG PO TABS
25.0000 mg | ORAL_TABLET | Freq: Two times a day (BID) | ORAL | 0 refills | Status: DC
  Filled 2022-09-02: qty 60, 30d supply, fill #0

## 2022-09-02 MED ORDER — HYDROCHLOROTHIAZIDE 25 MG PO TABS
25.0000 mg | ORAL_TABLET | Freq: Every day | ORAL | 0 refills | Status: DC
  Filled 2022-09-02: qty 30, 30d supply, fill #0

## 2022-09-02 MED ORDER — METFORMIN HCL 1000 MG PO TABS
1000.0000 mg | ORAL_TABLET | Freq: Two times a day (BID) | ORAL | 0 refills | Status: DC
  Filled 2022-09-02: qty 60, 30d supply, fill #0

## 2022-09-14 ENCOUNTER — Ambulatory Visit: Payer: Medicaid Other | Admitting: Sports Medicine

## 2022-09-19 ENCOUNTER — Encounter: Payer: Self-pay | Admitting: Sports Medicine

## 2022-09-19 ENCOUNTER — Ambulatory Visit (INDEPENDENT_AMBULATORY_CARE_PROVIDER_SITE_OTHER): Payer: Worker's Compensation

## 2022-09-19 ENCOUNTER — Ambulatory Visit (INDEPENDENT_AMBULATORY_CARE_PROVIDER_SITE_OTHER): Payer: Worker's Compensation | Admitting: Sports Medicine

## 2022-09-19 DIAGNOSIS — M25531 Pain in right wrist: Secondary | ICD-10-CM

## 2022-09-19 DIAGNOSIS — S52551D Other extraarticular fracture of lower end of right radius, subsequent encounter for closed fracture with routine healing: Secondary | ICD-10-CM | POA: Diagnosis not present

## 2022-09-19 DIAGNOSIS — S62024D Nondisplaced fracture of middle third of navicular [scaphoid] bone of right wrist, subsequent encounter for fracture with routine healing: Secondary | ICD-10-CM

## 2022-09-19 NOTE — Progress Notes (Signed)
Paul Lamb - 37 y.o. male MRN [id number]  Date of birth: 12-Jan-1986  Office Visit Note: Visit Date: 09/19/2022 PCP: Ladell Pier, MD Referred by: Ladell Pier, MD  Subjective: Chief Complaint  Patient presents with   Right Wrist - Pain   HPI: Paul Lamb is a pleasant 37 y.o. male who presents today for follow-up of right distal radius fracture and middle waist scaphoid fracture.     - Initial DOI: 05/02/22  - Placed in Thumb spica cast 05/10/22  He has been started in occupational therapy.  He is going twice a week.  He states his therapist told him that he has progressed and does not think that he needs to continue this further in a formal setting.  He did give me a note from [facility] but states he has had 5 sessions, has 1 left and they feel he is safe to be discharged from PT/OT as he has made excellent improvement.  Report his grip strength improved from 58 to 85%.   Paul Lamb continues to work and has not noticed any issues.  He feels confident he can fully return to work.  He no longer wears a brace, only at times when he is lifting something heavy more so for support/comfort.  Every blue moon he will have some mild pain that he describes as more fatigue of the wrist with lifting activities.  Not taking any medication for the wrist/scaphoid.  Pertinent ROS were reviewed with the patient and found to be negative unless otherwise specified above in HPI.   Assessment & Plan: Visit Diagnoses:  1. Pain in right wrist   2. Other closed extra-articular fracture of distal end of right radius with routine healing, subsequent encounter   3. Closed nondisplaced fracture of middle third of scaphoid of right wrist with routine healing, subsequent encounter    Plan: Discussed with Paul Lamb today both radiographic and clinical evidence of healing distal radius and scaphoid fracture.  His recent Occupational Therapy note states that he vastly improved on his  strength and range of motion, his exam supports this today.  I did discuss with him and write a note for full clearance to return to work without restrictions.  If he is lifting heavy objects, he may wear his thumb spica brace for comfort only, although this is not required.  His radiographs show good evidence of bony healing.  Given the mid-waist scaphoid fracture, I would like to see him back in 3 months for repeat x-ray to ensure complete union and maintained alignment.  If he is doing well clinically and radiographically, we will likely completely release him from my care at that time.  If there is any question for clinical healing, may consider CT scan of the wrist at that time.  Will follow-up in 3 months.  Follow-up: Return in about 3 months (around 12/19/2022) for for right wrist (repeat x-rays).   Meds & Orders: No orders of the defined types were placed in this encounter.   Orders Placed This Encounter  Procedures   XR Wrist Complete Right     Procedures: No procedures performed      Clinical History: No specialty comments available.  He reports that he has never smoked. He has never used smokeless tobacco.  Recent Labs    11/30/21 1121  HGBA1C 8.5*    Objective:    Physical Exam  Gen: Well-appearing, in no acute distress; non-toxic CV: Well-perfused. Warm.  Resp: Breathing unlabored on  room air; no wheezing. Psych: Fluid speech in conversation; appropriate affect; normal thought process Neuro: Sensation intact throughout. No gross coordination deficits.   Ortho Exam - Right wrist: No skin abnormalities, swelling or redness.  There is vast improvement of atrophy of the wrist extensors.  He can wiggle all 5 fingers without difficulty.  Very trivial TTP with deep palpation at the radial aspect of the distal radius.  No scaphoid or snuffbox TTP.  There is mildly diminished grip strength compared to contralateral side, although resisted wrist flexion and extension is equivocal  with full strength.  Imaging: XR Wrist Complete Right  Result Date: 09/19/2022 4 views of the right wrist including AP, lateral, oblique and scaphoid view with ulnar deviation was ordered and reviewed by myself.  X-rays demonstrate a healed oblique, nondisplaced fracture of the distal radial diamethaphysis.  There is evidence of a previous middle one third scaphoid waist fracture with bony healing and callus formation.  There is resolution of lucency within the scaphoid waist, indicative of a healed fracture.  There is some callus and bony spurring off the radial aspect of the the scaphoid waist.    Past Medical/Family/Surgical/Social History: Medications & Allergies reviewed per EMR, new medications updated. Patient Active Problem List   Diagnosis Date Noted   Diabetes mellitus type 2 in obese ([place]) 36/14/4315   Diastolic congestive heart failure ([place]) 11/28/2017   Proteinuria    Erectile dysfunction 05/19/2017   Depression 05/19/2017   Essential hypertension 01/12/2016   Sleep apnea 01/12/2016   Abnormal EKG    Hand pain, left 11/01/2015   Morbid obesity ([place]) 11/01/2015   Past Medical History:  Diagnosis Date   Acute combined systolic and diastolic CHF, NYHA class 3 ([place]) 11/18/2016   EF now 40-45% by echo   Acute diastolic (congestive) heart failure ([place]) 01/12/2016   Diabetes mellitus, new onset ([place])    Hypertension    Morbid obesity (HCC)    OSA (obstructive sleep apnea)    Family History  Problem Relation Age of Onset   Hypertension Other    Diabetes Mellitus II Other    Past Surgical History:  Procedure Laterality Date   NO PAST SURGERIES     Social History   Occupational History   Not on file  Tobacco Use   Smoking status: Never   Smokeless tobacco: Never  Vaping Use   Vaping Use: Never used  Substance and Sexual Activity   Alcohol use: Yes    Alcohol/week: 0.0 standard drinks of alcohol   Drug use: No   Sexual activity: Yes

## 2022-09-21 ENCOUNTER — Encounter: Payer: Self-pay | Admitting: Internal Medicine

## 2022-09-23 ENCOUNTER — Other Ambulatory Visit: Payer: Self-pay | Admitting: Pharmacist

## 2022-09-23 ENCOUNTER — Ambulatory Visit: Payer: Medicaid Other | Attending: Internal Medicine | Admitting: Internal Medicine

## 2022-09-23 ENCOUNTER — Encounter: Payer: Self-pay | Admitting: Internal Medicine

## 2022-09-23 ENCOUNTER — Other Ambulatory Visit: Payer: Self-pay

## 2022-09-23 DIAGNOSIS — I152 Hypertension secondary to endocrine disorders: Secondary | ICD-10-CM

## 2022-09-23 DIAGNOSIS — E1169 Type 2 diabetes mellitus with other specified complication: Secondary | ICD-10-CM | POA: Diagnosis not present

## 2022-09-23 DIAGNOSIS — I5030 Unspecified diastolic (congestive) heart failure: Secondary | ICD-10-CM | POA: Diagnosis not present

## 2022-09-23 DIAGNOSIS — E785 Hyperlipidemia, unspecified: Secondary | ICD-10-CM

## 2022-09-23 DIAGNOSIS — Z23 Encounter for immunization: Secondary | ICD-10-CM

## 2022-09-23 DIAGNOSIS — E1159 Type 2 diabetes mellitus with other circulatory complications: Secondary | ICD-10-CM

## 2022-09-23 DIAGNOSIS — Z7984 Long term (current) use of oral hypoglycemic drugs: Secondary | ICD-10-CM

## 2022-09-23 LAB — GLUCOSE, POCT (MANUAL RESULT ENTRY): POC Glucose: 137 mg/dl — AB (ref 70–99)

## 2022-09-23 LAB — POCT GLYCOSYLATED HEMOGLOBIN (HGB A1C): HbA1c, POC (controlled diabetic range): 8.4 % — AB (ref 0.0–7.0)

## 2022-09-23 MED ORDER — HYDROCHLOROTHIAZIDE 25 MG PO TABS
25.0000 mg | ORAL_TABLET | Freq: Every day | ORAL | 1 refills | Status: DC
  Filled 2022-09-23 – 2022-10-11 (×2): qty 90, 90d supply, fill #0
  Filled 2023-01-27: qty 90, 90d supply, fill #1

## 2022-09-23 MED ORDER — DEXCOM G7 RECEIVER DEVI: 0 refills | Status: DC

## 2022-09-23 MED ORDER — LOSARTAN POTASSIUM 100 MG PO TABS
100.0000 mg | ORAL_TABLET | Freq: Every day | ORAL | 1 refills | Status: DC
  Filled 2022-09-23 – 2022-10-11 (×2): qty 90, 90d supply, fill #0
  Filled 2023-01-20 – 2023-01-27 (×2): qty 90, 90d supply, fill #1

## 2022-09-23 MED ORDER — CARVEDILOL 25 MG PO TABS
25.0000 mg | ORAL_TABLET | Freq: Two times a day (BID) | ORAL | 1 refills | Status: DC
  Filled 2022-09-23 – 2022-10-11 (×2): qty 180, 90d supply, fill #0
  Filled 2023-01-27: qty 180, 90d supply, fill #1

## 2022-09-23 MED ORDER — HYDRALAZINE HCL 100 MG PO TABS
100.0000 mg | ORAL_TABLET | Freq: Three times a day (TID) | ORAL | 1 refills | Status: DC
  Filled 2022-09-23 – 2022-10-11 (×2): qty 270, 90d supply, fill #0
  Filled 2023-01-27: qty 270, 90d supply, fill #1

## 2022-09-23 MED ORDER — INSULIN GLARGINE 100 UNITS/ML SOLOSTAR PEN: 18.0000 [IU] | PEN_INJECTOR | Freq: Every day | SUBCUTANEOUS | 1 refills | Status: DC

## 2022-09-23 MED ORDER — GLIPIZIDE 5 MG PO TABS
5.0000 mg | ORAL_TABLET | Freq: Two times a day (BID) | ORAL | 1 refills | Status: DC
  Filled 2022-09-23 – 2022-10-11 (×2): qty 180, 90d supply, fill #0

## 2022-09-23 MED ORDER — DEXCOM G7 SENSOR MISC: 6 refills | Status: DC

## 2022-09-23 MED ORDER — DAPAGLIFLOZIN PROPANEDIOL 10 MG PO TABS
10.0000 mg | ORAL_TABLET | Freq: Every day | ORAL | 1 refills | Status: DC
  Filled 2022-09-23 – 2022-10-11 (×2): qty 90, 90d supply, fill #0
  Filled 2023-01-20: qty 30, 30d supply, fill #1
  Filled 2023-02-27: qty 60, 60d supply, fill #2

## 2022-09-23 MED ORDER — TRULICITY 3 MG/0.5ML ~~LOC~~ SOAJ
3.0000 mg | SUBCUTANEOUS | 6 refills | Status: DC
  Filled 2022-09-23: qty 2, 28d supply, fill #0
  Filled 2022-10-28: qty 2, 28d supply, fill #1
  Filled 2022-12-09: qty 2, 28d supply, fill #2
  Filled 2023-01-20: qty 2, 28d supply, fill #3
  Filled 2023-03-03 (×2): qty 2, 28d supply, fill #4
  Filled 2023-04-27 (×2): qty 2, 28d supply, fill #5
  Filled 2023-06-23 – 2023-08-10 (×2): qty 2, 28d supply, fill #6

## 2022-09-23 MED ORDER — ATORVASTATIN CALCIUM 40 MG PO TABS
40.0000 mg | ORAL_TABLET | Freq: Every day | ORAL | 1 refills | Status: DC
  Filled 2022-09-23 – 2022-10-11 (×2): qty 90, 90d supply, fill #0
  Filled 2023-01-20 – 2023-01-27 (×2): qty 90, 90d supply, fill #1

## 2022-09-23 MED ORDER — INSULIN GLARGINE 100 UNITS/ML SOLOSTAR PEN
18.0000 [IU] | PEN_INJECTOR | Freq: Every day | SUBCUTANEOUS | 6 refills | Status: DC
  Filled 2022-09-23: qty 15, 83d supply, fill #0
  Filled 2022-09-23: qty 309, fill #0

## 2022-09-23 MED ORDER — METFORMIN HCL 1000 MG PO TABS
1000.0000 mg | ORAL_TABLET | Freq: Two times a day (BID) | ORAL | 1 refills | Status: DC
  Filled 2022-09-23 – 2022-10-11 (×2): qty 180, 90d supply, fill #0
  Filled 2023-01-27: qty 180, 90d supply, fill #1

## 2022-09-23 MED ORDER — FUROSEMIDE 20 MG PO TABS
20.0000 mg | ORAL_TABLET | Freq: Every day | ORAL | 1 refills | Status: DC
  Filled 2022-09-23 – 2022-10-11 (×2): qty 90, 90d supply, fill #0
  Filled 2023-01-20 – 2023-01-27 (×2): qty 90, 90d supply, fill #1

## 2022-09-23 MED ORDER — AMLODIPINE BESYLATE 10 MG PO TABS
10.0000 mg | ORAL_TABLET | Freq: Every day | ORAL | 1 refills | Status: DC
  Filled 2022-09-23 – 2022-10-11 (×2): qty 90, 90d supply, fill #0
  Filled 2023-01-20 – 2023-01-27 (×2): qty 90, 90d supply, fill #1

## 2022-09-23 NOTE — Progress Notes (Signed)
Patient ID: Paul Lamb, male    DOB: September 06, 1985  MRN: [id number]  CC: Diabetes (Dm f/u. Med refill. /Requesting to switch from [place] to [facility] continuous monitor. Requesting for a higher dosage of trulicity. Valentino Hue to flu vax. )   Subjective: Paul Lamb is a 37 y.o. male who presents for chronic ds management His concerns today include:  Patient with history of HTN, diastolic CHF with EF of 50-55%, HTN, OSA on CPAP, HL, morbid obesity, diabetes with neuropathy, depression.   Since last visit with me, he had distal radius and scaphoid fracture of the right wrist/hand.  He was seeing sports medicine.  DM: Results for orders placed or performed in visit on 09/23/22  POCT glucose (manual entry)  Result Value Ref Range   POC Glucose 137 (A) 70 - 99 mg/dl  POCT glycosylated hemoglobin (Hb A1C)  Result Value Ref Range   Hemoglobin A1C     HbA1c POC (<> result, manual entry)     HbA1c, POC (prediabetic range)     HbA1c, POC (controlled diabetic range) 8.4 (A) 0.0 - 7.0 %  Patient currently on metformin 1 g twice a day, Glucotrol 5 mg twice, Lantus 18 units daily, Farxiga 10 mg daily and Trulicity 1.5 mg once a week.  Did not get to take Trulicity yesterday as it malfunction and all the med/liquid shoot out.   BS readings: wants to change to Dexcom from [place].  Libre falls off easily and thinks he was getting inaccurate readings.  Last wore Libre sensor 1 wk ago.   Eating habits: "I have good days and bad days."  down 20 lbs since we last saw him 11/2021.   Exercise:  HYPERTENSION/CHF Currently taking: see medication list.  On Farxiga 10 mg, Norvasc 10 mg, Coreg 25 mg BID, Cozaar 100 mg, Hydralazine 100 mg TID, HCTZ 25 and lasix 20 mg Med Adherence: [x]  Yes    []  No Medication side effects: []  Yes   Adherence with salt restriction: [x]  Yes    []  No Home Monitoring?: []  Yes    [x]  No, needs new device Monitoring Frequency:  Home BP results range:  SOB? []  Yes    [x]  No Chest  Pain?: []  Yes    [x]  No Leg swelling?: []  Yes    [x]  No Headaches?: []    [x]  No Dizziness? []  Yes    [x]  No Comments:   OSA: using CPAP consistently and feels that he benefits from using it.  He wakes up feeling refreshed.   and tolerating Lipitor 40 mg   Patient Active Problem List   Diagnosis Date Noted   Diabetes mellitus type 2 in obese (HCC) 12/29/2017   Diastolic congestive heart failure (HCC) 11/28/2017   Proteinuria    Erectile dysfunction 05/19/2017   Depression 05/19/2017   Essential hypertension 01/12/2016   Sleep apnea 01/12/2016   Abnormal EKG    Hand pain, left 11/01/2015   Morbid obesity (HCC) 11/01/2015     Current Outpatient Medications on File Prior to Visit  Medication Sig Dispense Refill   aspirin 81 MG EC tablet Take 1 tablet (81 mg total) by mouth daily. 100 tablet 1   Insulin Pen Needle (BD PEN NEEDLE NANO U/F) 32G X 4 MM MISC USE TO INJECT VICTOZA DAILY 100 each 3   sildenafil (VIAGRA) 100 MG tablet TAKE 1 TABLET 1/2 TO 1 HOUR PRIOR TO INTERCOURSE AS NEEDED 15 tablet 1   No current facility-administered medications on file prior  to visit.    Allergies  Allergen Reactions   Imdur [Isosorbide Dinitrate] Other (See Comments)    Headaches    Penicillins Other (See Comments)    From childhood; reaction not known: Has patient had a PCN reaction causing immediate rash, facial/tongue/throat swelling, SOB or lightheadedness with hypotension: Unk Has patient had a PCN reaction causing severe rash involving mucus membranes or skin necrosis: Unk Has patient had a PCN reaction that required hospitalization: Unk Has patient had a PCN reaction occurring within the last 10 years: No If all of the above answers are "NO", then may proceed with Cephalosporin use.     Social History   Socioeconomic History   Marital status: Single    Spouse name: Not on file   Number of children: Not on file   Years of education: Not on file   Highest education  level: Not on file  Occupational History   Not on file  Tobacco Use   Smoking status: Never   Smokeless tobacco: Never  Vaping Use   Vaping Use: Never used  Substance and Sexual Activity   Alcohol use: Yes    Alcohol/week: 0.0 standard drinks of alcohol   Drug use: No   Sexual activity: Yes  Other Topics Concern   Not on file  Social History Narrative   Not on file   Social Determinants of Health   Financial Resource Strain: Not on file  Food Insecurity: Not on file  Transportation Needs: Not on file  Physical Activity: Inactive (09/10/2017)   Exercise Vital Sign    Days of Exercise per Week: 0 days    Minutes of Exercise per Session: 0 min  Stress: Stress Concern Present (09/10/2017)   [place]    Feeling of Stress : To some extent  Social Connections: Unknown (09/10/2017)   Social Connection and Isolation Panel [NHANES]    Frequency of Communication with Friends and Family: Patient refused    Frequency of Social Gatherings with Friends and Family: Patient refused    Attends Religious Services: Patient refused    Active Member of Clubs or Organizations: Patient refused    Attends Archivist Meetings: Patient refused    Marital Status: Patient refused  Intimate Partner Violence: Not At Risk (09/10/2017)   Humiliation, Afraid, Rape, and Kick questionnaire    Fear of Current or Ex-Partner: No    Emotionally Abused: No    Physically Abused: No    Sexually Abused: No    Family History  Problem Relation Age of Onset   Hypertension Other    Diabetes Mellitus II Other     Past Surgical History:  Procedure Laterality Date   NO PAST SURGERIES      ROS: Review of Systems Negative except as stated above  PHYSICAL EXAM: BP 116/76 (BP Location: Left Arm, Patient Position: Sitting, Cuff Size: Large)   Pulse 84   Temp 97.6 F (36.4 C) (Oral)   Ht 6' (1.829 m)   Wt (!) 391 lb (177.4 kg)    SpO2 96%   BMI 53.03 kg/m   Wt Readings from Last 3 Encounters:  09/23/22 (!) 391 lb (177.4 kg)  05/03/22 (!) 389 lb (176.4 kg)  11/30/21 (!) 411 lb 6.4 oz (186.6 kg)    Physical Exam General appearance - alert, well appearing, obese AAM and in no distress Mental status - normal mood, behavior, speech, dress, motor activity, and thought processes Neck - supple, no significant  adenopathy Chest - clear to auscultation, no wheezes, rales or rhonchi, symmetric air entry Heart - normal rate, regular rhythm, normal S1, S2, no murmurs, rubs, clicks or gallops Extremities - peripheral pulses normal, no pedal edema, no clubbing or cyanosis     Latest Ref Rng & Units 05/10/2021    3:50 PM 06/12/2020    4:50 PM 06/28/2019    3:15 PM  CMP  Glucose 65 - 99 mg/dL 282  72  87   BUN 6 - 20 mg/dL 9  16  15    Creatinine 0.76 - 1.27 mg/dL 0.99  1.14  1.07   Sodium 134 - 144 mmol/L 138  140  140   Potassium 3.5 - 5.2 mmol/L 4.0  4.0  3.7   Chloride 96 - 106 mmol/L 102  101  99   CO2 20 - 29 mmol/L 24  25  26    Calcium 8.7 - 10.2 mg/dL 9.2  10.3  9.6   Total Protein 6.0 - 8.5 g/dL 6.8  7.3    Total Bilirubin 0.0 - 1.2 mg/dL 1.1  0.9    Alkaline Phos 44 - 121 IU/L 110  99    AST 0 - 40 IU/L 11  14    ALT 0 - 44 IU/L 18  21     Lipid Panel     Component Value Date/Time   CHOL 149 05/10/2021 1550   TRIG 302 (H) 05/10/2021 1550   HDL 27 (L) 05/10/2021 1550   CHOLHDL 5.5 (H) 05/10/2021 1550   CHOLHDL 7.6 09/10/2017 0451   VLDL 29 09/10/2017 0451   LDLCALC 73 05/10/2021 1550    CBC    Component Value Date/Time   WBC 6.7 05/10/2021 1550   WBC 9.3 03/17/2019 1900   RBC 6.09 (H) 05/10/2021 1550   RBC 6.43 (H) 03/17/2019 1900   HGB 14.7 05/10/2021 1550   HCT 43.4 05/10/2021 1550   PLT 224 05/10/2021 1550   MCV 71 (L) 05/10/2021 1550   MCH 24.1 (L) 05/10/2021 1550   MCH 24.3 (L) 03/17/2019 1900   MCHC 33.9 05/10/2021 1550   MCHC 34.8 03/17/2019 1900   RDW 15.2 05/10/2021 1550    LYMPHSABS 1.9 11/01/2015 1043   MONOABS 0.6 11/01/2015 1043   EOSABS 0.1 11/01/2015 1043   BASOSABS 0.1 11/01/2015 1043    ASSESSMENT AND PLAN:  1. Type 2 diabetes mellitus with morbid obesity ([place]) Commended him on weight loss. A1c is not at goal. Increase Trulicity to 3 mg once a week. Continue glargine insulin 18 units daily, Farxiga, metformin 1 g twice a day and Glucotrol 5 mg twice a day. Prescription sent in for Dexcom device - POCT glucose (manual entry) - POCT glycosylated hemoglobin (Hb A1C) - CBC - Comprehensive metabolic panel - Lipid panel - Microalbumin / creatinine urine ratio - Continuous Blood Gluc Receiver ([place]) DEVI; UAD  Dispense: 1 each; Refill: 0 - Continuous Blood Gluc Sensor (DEXCOM G7 SENSOR) MISC; Change sensor Q 10 days  Dispense: 3 each; Refill: 6 - Dulaglutide (TRULICITY) 3 ZO/1.0RU SOPN; Inject 3 mg as directed once a week.  Dispense: 2 mL; Refill: 6 - dapagliflozin propanediol (FARXIGA) 10 MG TABS tablet; Take 1 tablet (10 mg total) by mouth daily.  Dispense: 90 tablet; Refill: 1 - glipiZIDE (GLUCOTROL) 5 MG tablet; Take 1 tablet (5 mg total) by mouth 2 (two) times daily.  Dispense: 180 tablet; Refill: 1 - metFORMIN (GLUCOPHAGE) 1000 MG tablet; Take 1 tablet (1,000 mg total) by  mouth 2 (two) times daily with a meal.  Dispense: 180 tablet; Refill: 1 - insulin glargine (LANTUS) 100 unit/mL SOPN; inject 18 units into the skin at bedtime  Dispense: 309 mL; Refill: 6  2. Hypertension associated with diabetes ([place]) Controlled.  Continue current medications listed above. - carvedilol (COREG) 25 MG tablet; Take 1 tablet (25 mg total) by mouth 2 (two) times daily with a meal.  Dispense: 180 tablet; Refill: 1 - hydrALAZINE (APRESOLINE) 100 MG tablet; Take 1 tablet (100 mg total) by mouth 3 (three) times daily.  Dispense: 270 tablet; Refill: 1 - hydrochlorothiazide (HYDRODIURIL) 25 MG tablet; Take 1 tablet (25 mg total) by mouth daily.  Dispense: 90  tablet; Refill: 1 - losartan (COZAAR) 100 MG tablet; Take 1 tablet (100 mg total) by mouth daily.  Dispense: 90 tablet; Refill: 1  3. Diastolic congestive heart failure, unspecified HF chronicity (HCC) Stable and compensated.  Continue carvedilol and furosemide. - furosemide (LASIX) 20 MG tablet; Take 1 tablet (20 mg total) by mouth daily.  Dispense: 90 tablet; Refill: 1  4. Hyperlipidemia associated with type 2 diabetes mellitus (HCC) Continue atorvastatin. - atorvastatin (LIPITOR) 40 MG tablet; Take 1 tablet (40 mg total) by mouth daily at 6 PM.  Dispense: 90 tablet; Refill: 1  5. Need for influenza vaccination Given today.    Patient was given the opportunity to ask questions.  Patient verbalized understanding of the plan and was able to repeat key elements of the plan.   This documentation was completed using Radio producer.  Any transcriptional errors are unintentional.  Orders Placed This Encounter  Procedures   Flu Vaccine QUAD 50mo+IM (Fluarix, Fluzone & Alfiuria Quad PF)   CBC   Comprehensive metabolic panel   Lipid panel   Microalbumin / creatinine urine ratio   POCT glucose (manual entry)   POCT glycosylated hemoglobin (Hb A1C)     Requested Prescriptions   Signed Prescriptions Disp Refills   Continuous Blood Gluc Receiver (DEXCOM G7 RECEIVER) DEVI 1 each 0    Sig: UAD   Continuous Blood Gluc Sensor (DEXCOM G7 SENSOR) MISC 3 each 6    Sig: Change sensor Q 10 days   Dulaglutide (TRULICITY) 3 TM/1.9QQ SOPN 2 mL 6    Sig: Inject 3 mg as directed once a week.   amLODipine (NORVASC) 10 MG tablet 90 tablet 1    Sig: Take 1 tablet (10 mg total) by mouth daily.   atorvastatin (LIPITOR) 40 MG tablet 90 tablet 1    Sig: Take 1 tablet (40 mg total) by mouth daily at 6 PM.   carvedilol (COREG) 25 MG tablet 180 tablet 1    Sig: Take 1 tablet (25 mg total) by mouth 2 (two) times daily with a meal.   dapagliflozin propanediol (FARXIGA) 10 MG TABS tablet  90 tablet 1    Sig: Take 1 tablet (10 mg total) by mouth daily.   furosemide (LASIX) 20 MG tablet 90 tablet 1    Sig: Take 1 tablet (20 mg total) by mouth daily.   glipiZIDE (GLUCOTROL) 5 MG tablet 180 tablet 1    Sig: Take 1 tablet (5 mg total) by mouth 2 (two) times daily.   hydrALAZINE (APRESOLINE) 100 MG tablet 270 tablet 1    Sig: Take 1 tablet (100 mg total) by mouth 3 (three) times daily.   hydrochlorothiazide (HYDRODIURIL) 25 MG tablet 90 tablet 1    Sig: Take 1 tablet (25 mg total) by mouth daily.  losartan (COZAAR) 100 MG tablet 90 tablet 1    Sig: Take 1 tablet (100 mg total) by mouth daily.   metFORMIN (GLUCOPHAGE) 1000 MG tablet 180 tablet 1    Sig: Take 1 tablet (1,000 mg total) by mouth 2 (two) times daily with a meal.    Return in about 4 months (around 01/22/2023).  Karle Plumber, MD, FACP

## 2022-09-23 NOTE — Patient Instructions (Signed)
Increase Trulicity to 3 mg once a week.  Please let me know if you develop any vomiting, abdominal pain or significant diarrhea with the increased dose.

## 2022-09-25 ENCOUNTER — Encounter: Payer: Self-pay | Admitting: Internal Medicine

## 2022-09-25 ENCOUNTER — Other Ambulatory Visit: Payer: Self-pay | Admitting: Internal Medicine

## 2022-09-25 DIAGNOSIS — R718 Other abnormality of red blood cells: Secondary | ICD-10-CM

## 2022-09-26 ENCOUNTER — Other Ambulatory Visit: Payer: Self-pay

## 2022-09-27 LAB — COMPREHENSIVE METABOLIC PANEL
ALT: 18 IU/L (ref 0–44)
AST: 16 IU/L (ref 0–40)
Albumin/Globulin Ratio: 1.7 (ref 1.2–2.2)
Albumin: 4.7 g/dL (ref 4.1–5.1)
Alkaline Phosphatase: 108 IU/L (ref 44–121)
BUN/Creatinine Ratio: 9 (ref 9–20)
BUN: 9 mg/dL (ref 6–20)
Bilirubin Total: 1.1 mg/dL (ref 0.0–1.2)
CO2: 22 mmol/L (ref 20–29)
Calcium: 10 mg/dL (ref 8.7–10.2)
Chloride: 102 mmol/L (ref 96–106)
Creatinine, Ser: 0.96 mg/dL (ref 0.76–1.27)
Globulin, Total: 2.7 g/dL (ref 1.5–4.5)
Glucose: 105 mg/dL — ABNORMAL HIGH (ref 70–99)
Potassium: 4.1 mmol/L (ref 3.5–5.2)
Sodium: 140 mmol/L (ref 134–144)
Total Protein: 7.4 g/dL (ref 6.0–8.5)
eGFR: 106 mL/min/{1.73_m2} (ref >59)

## 2022-09-27 LAB — CBC
Hematocrit: 45.4 % (ref 37.5–51.0)
Hemoglobin: 15.7 g/dL (ref 13.0–17.7)
MCH: 24.8 pg — ABNORMAL LOW (ref 26.6–33.0)
MCHC: 34.6 g/dL (ref 31.5–35.7)
MCV: 72 fL — ABNORMAL LOW (ref 79–97)
Platelets: 234 10*3/uL (ref 150–450)
RBC: 6.34 x10E6/uL — ABNORMAL HIGH (ref 4.14–5.80)
RDW: 17.1 % — ABNORMAL HIGH (ref 11.6–15.4)
WBC: 6.1 10*3/uL (ref 3.4–10.8)

## 2022-09-27 LAB — LIPID PANEL
Chol/HDL Ratio: 3.9 ratio (ref 0.0–5.0)
Cholesterol, Total: 120 mg/dL (ref 100–199)
HDL: 31 mg/dL — ABNORMAL LOW (ref >39)
LDL Chol Calc (NIH): 65 mg/dL (ref 0–99)
Triglycerides: 138 mg/dL (ref 0–149)
VLDL Cholesterol Cal: 24 mg/dL (ref 5–40)

## 2022-09-27 LAB — MICROALBUMIN / CREATININE URINE RATIO
Creatinine, Urine: 33.8 mg/dL
Microalb/Creat Ratio: 16 mg/g creat (ref 0–29)
Microalbumin, Urine: 5.3 ug/mL

## 2022-10-11 ENCOUNTER — Other Ambulatory Visit: Payer: Self-pay | Admitting: Internal Medicine

## 2022-10-11 DIAGNOSIS — E1169 Type 2 diabetes mellitus with other specified complication: Secondary | ICD-10-CM

## 2022-10-12 ENCOUNTER — Other Ambulatory Visit: Payer: Self-pay

## 2022-10-12 MED ORDER — TECHLITE PEN NEEDLES 32G X 4 MM MISC
3 refills | Status: DC
  Filled 2022-10-12: qty 100, 100d supply, fill #0
  Filled 2023-04-27 (×2): qty 100, 100d supply, fill #1
  Filled 2023-08-10: qty 100, 100d supply, fill #2

## 2022-10-28 ENCOUNTER — Other Ambulatory Visit: Payer: Self-pay

## 2022-10-31 ENCOUNTER — Other Ambulatory Visit: Payer: Self-pay

## 2022-10-31 ENCOUNTER — Encounter: Payer: Self-pay | Admitting: Cardiovascular Disease

## 2022-11-25 ENCOUNTER — Encounter: Payer: Self-pay | Admitting: Internal Medicine

## 2022-11-28 ENCOUNTER — Encounter: Payer: Self-pay | Admitting: Internal Medicine

## 2022-11-28 ENCOUNTER — Ambulatory Visit: Payer: Medicaid Other | Attending: Internal Medicine | Admitting: Internal Medicine

## 2022-11-28 ENCOUNTER — Other Ambulatory Visit: Payer: Self-pay

## 2022-11-28 DIAGNOSIS — I152 Hypertension secondary to endocrine disorders: Secondary | ICD-10-CM | POA: Diagnosis not present

## 2022-11-28 DIAGNOSIS — E1159 Type 2 diabetes mellitus with other circulatory complications: Secondary | ICD-10-CM

## 2022-11-28 DIAGNOSIS — E1169 Type 2 diabetes mellitus with other specified complication: Secondary | ICD-10-CM

## 2022-11-28 LAB — POCT GLYCOSYLATED HEMOGLOBIN (HGB A1C): HbA1c, POC (controlled diabetic range): 7.5 % — AB (ref 0.0–7.0)

## 2022-11-28 LAB — GLUCOSE, POCT (MANUAL RESULT ENTRY): POC Glucose: 138 mg/dl — AB (ref 70–99)

## 2022-11-28 MED ORDER — GLIPIZIDE 10 MG PO TABS
10.0000 mg | ORAL_TABLET | Freq: Two times a day (BID) | ORAL | 4 refills | Status: DC
Start: 2022-11-28 — End: 2023-10-27
  Filled 2022-11-28: qty 60, 30d supply, fill #0
  Filled 2023-01-27: qty 60, 30d supply, fill #1
  Filled 2023-04-27 (×2): qty 60, 30d supply, fill #2
  Filled 2023-08-10: qty 60, 30d supply, fill #3

## 2022-11-28 NOTE — Patient Instructions (Signed)
Hold the Lantus insulin for 1 to 2 weeks and see how your blood sugars do. Increase glipizide to 10 mg twice a day in the meantime.

## 2022-11-28 NOTE — Progress Notes (Signed)
Patient ID: Paul Lamb, male    DOB: 11/13/1985  MRN: [id number]  CC: Diabetes (DM f/u. /Patient requesting to stop insulin. )   Subjective: Paul Lamb is a 37 y.o. male who presents for f/u DM.  Last seen 09/2022 His concerns today include:  Patient with history of HTN, diastolic CHF with EF of 50-55%, HTN, OSA on CPAP, HL, morbid obesity, diabetes with neuropathy, depression.   DM:  Results for orders placed or performed in visit on 11/28/22  POCT glucose (manual entry)  Result Value Ref Range   POC Glucose 138 (A) 70 - 99 mg/dl  POCT glycosylated hemoglobin (Hb A1C)  Result Value Ref Range   Hemoglobin A1C     HbA1c POC (<> result, manual entry)     HbA1c, POC (prediabetic range)     HbA1c, POC (controlled diabetic range) 7.5 (A) 0.0 - 7.0 %   A1c on last visit 09/23/2022 was 8.4.  Prescribed Dexcom as freestyle Josephine Igolibre was not staying on. Dexcom not staying on upper arm either so he has placed it on abdomen yesterday Trulicity increased to 3 mg/week on last visit.  Continued on glargine 18 units daily, Farxiga 10 mg daily, metformin 1 g twice a day and Glucotrol 5 mg twice a day. -Eating BF and dinner, Pt wanting to come off insulin.   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  Will be required to have 3 months of blood sugar readings to be able to pass if he is on insulin.  He does not have 3 months worth of reading.  Attributes this to issues he was having with the freestyle libre and now also with the Dexcom not staying on.  He decided to try the Dexcom device on his abdomen.  Currently has less than 1 week worth of reading on the device with reported blood sugar in range 57% of the times.  Denies any low blood sugar episodes. Reports he has been doing a lot better with his eating habits and tries to get to the gym at least twice a week when he is on the road.  Weight is up 7 pounds since we last saw him in February. "I know what I need to do to  keep my numbers down." Feels he would benefit from seeing a therapist because "when I get stressed the first thing I do is eat."   HTN:  BP elev today.  Took meds already for today Confirms taking:  Norvasc 10 mg, Coreg 25 mg BID, Cozaar 100 mg, Hydralazine 100 mg TID, HCTZ 25 and lasix 20 mg  No CP/SOB/LE edema Patient Active Problem List   Diagnosis Date Noted   Diabetes mellitus type 2 in obese 12/29/2017   Diastolic congestive heart failure 11/28/2017   Proteinuria    Erectile dysfunction 05/19/2017   Depression 05/19/2017   Essential hypertension 01/12/2016   Sleep apnea 01/12/2016   Abnormal EKG    Hand pain, left 11/01/2015   Morbid obesity 11/01/2015     Current Outpatient Medications on File Prior to Visit  Medication Sig Dispense Refill   amLODipine (NORVASC) 10 MG tablet Take 1 tablet (10 mg total) by mouth daily. 90 tablet 1   aspirin 81 MG EC tablet Take 1 tablet (81 mg total) by mouth daily. 100 tablet 1   atorvastatin (LIPITOR) 40 MG tablet Take 1 tablet (40 mg total) by mouth daily at 6 PM. 90 tablet 1   carvedilol (COREG) 25  MG tablet Take 1 tablet (25 mg total) by mouth 2 (two) times daily with a meal. 180 tablet 1   Continuous Blood Gluc Receiver (DEXCOM G7 RECEIVER) DEVI UAD 1 each 0   Continuous Blood Gluc Sensor (DEXCOM G7 SENSOR) MISC Change sensor Q 10 days 3 each 6   dapagliflozin propanediol (FARXIGA) 10 MG TABS tablet Take 1 tablet (10 mg total) by mouth daily. 90 tablet 1   Dulaglutide (TRULICITY) 3 MG/0.5ML SOPN Inject 3 mg as directed once a week. 2 mL 6   furosemide (LASIX) 20 MG tablet Take 1 tablet (20 mg total) by mouth daily. 90 tablet 1   hydrALAZINE (APRESOLINE) 100 MG tablet Take 1 tablet (100 mg total) by mouth 3 (three) times daily. 270 tablet 1   hydrochlorothiazide (HYDRODIURIL) 25 MG tablet Take 1 tablet (25 mg total) by mouth daily. 90 tablet 1   insulin glargine (LANTUS) 100 unit/mL SOPN Inject 18 Units into the skin at bedtime. 15 mL 1    Insulin Pen Needle (TECHLITE PEN NEEDLES) 32G X 4 MM MISC Use to inject Lantus once daily. 100 each 3   losartan (COZAAR) 100 MG tablet Take 1 tablet (100 mg total) by mouth daily. 90 tablet 1   metFORMIN (GLUCOPHAGE) 1000 MG tablet Take 1 tablet (1,000 mg total) by mouth 2 (two) times daily with a meal. 180 tablet 1   sildenafil (VIAGRA) 100 MG tablet TAKE 1 TABLET 1/2 TO 1 HOUR PRIOR TO INTERCOURSE AS NEEDED 15 tablet 1   No current facility-administered medications on file prior to visit.    Allergies  Allergen Reactions   Imdur [Isosorbide Dinitrate] Other (See Comments)    Headaches    Penicillins Other (See Comments)    From childhood; reaction not known: Has patient had a PCN reaction causing immediate rash, facial/tongue/throat swelling, SOB or lightheadedness with hypotension: Unk Has patient had a PCN reaction causing severe rash involving mucus membranes or skin necrosis: Unk Has patient had a PCN reaction that required hospitalization: Unk Has patient had a PCN reaction occurring within the last 10 years: No If all of the above answers are "NO", then may proceed with Cephalosporin use.     Social History   Socioeconomic History   Marital status: Single    Spouse name: Not on file   Number of children: Not on file   Years of education: Not on file   Highest education level: Not on file  Occupational History   Not on file  Tobacco Use   Smoking status: Never   Smokeless tobacco: Never  Vaping Use   Vaping Use: Never used  Substance and Sexual Activity   Alcohol use: Yes    Alcohol/week: 0.0 standard drinks of alcohol   Drug use: No   Sexual activity: Yes  Other Topics Concern   Not on file  Social History Narrative   Not on file   Social Determinants of Health   Financial Resource Strain: Not on file  Food Insecurity: Not on file  Transportation Needs: Not on file  Physical Activity: Inactive (09/10/2017)   Exercise Vital Sign    Days of Exercise per  Week: 0 days    Minutes of Exercise per Session: 0 min  Stress: Stress Concern Present (09/10/2017)   [employer] of Occupational Health - Occupational Stress Questionnaire    Feeling of Stress : To some extent  Social Connections: Unknown (09/10/2017)   Social Connection and Isolation Panel [NHANES]    Frequency of  Communication with Friends and Family: Patient declined    Frequency of Social Gatherings with Friends and Family: Patient declined    Attends Religious Services: Patient declined    Active Member of Clubs or Organizations: Patient declined    Attends Banker Meetings: Patient declined    Marital Status: Patient declined  Intimate Partner Violence: Not At Risk (09/10/2017)   Humiliation, Afraid, Rape, and Kick questionnaire    Fear of Current or Ex-Partner: No    Emotionally Abused: No    Physically Abused: No    Sexually Abused: No    Family History  Problem Relation Age of Onset   Hypertension Other    Diabetes Mellitus II Other     Past Surgical History:  Procedure Laterality Date   NO PAST SURGERIES      ROS: Review of Systems Negative except as stated above  PHYSICAL EXAM: BP 130/85   Pulse 76   Temp 98.3 F (36.8 C) (Oral)   Ht 6' (1.829 m)   Wt (!) 398 lb (180.5 kg)   SpO2 96%   BMI 53.98 kg/m   Wt Readings from Last 3 Encounters:  11/28/22 (!) 398 lb (180.5 kg)  09/23/22 (!) 391 lb (177.4 kg)  05/03/22 (!) 389 lb (176.4 kg)    Physical Exam   General appearance - alert, well appearing, morbidly obese AAM and in no distress Mental status - normal mood, behavior, speech, dress, motor activity, and thought processes Chest - clear to auscultation, no wheezes, rales or rhonchi, symmetric air entry Heart - normal rate, regular rhythm, normal S1, S2, no murmurs, rubs, clicks or gallops Extremities - peripheral pulses normal, no pedal edema, no clubbing or cyanosis     Latest Ref Rng & Units 09/23/2022   10:30 AM 05/10/2021     3:50 PM 06/12/2020    4:50 PM  CMP  Glucose 70 - 99 mg/dL 749  355  72   BUN 6 - 20 mg/dL 9  9  16    Creatinine 0.76 - 1.27 mg/dL 2.17  4.71  5.95   Sodium 134 - 144 mmol/L 140  138  140   Potassium 3.5 - 5.2 mmol/L 4.1  4.0  4.0   Chloride 96 - 106 mmol/L 102  102  101   CO2 20 - 29 mmol/L 22  24  25    Calcium 8.7 - 10.2 mg/dL 39.6  9.2  72.8   Total Protein 6.0 - 8.5 g/dL 7.4  6.8  7.3   Total Bilirubin 0.0 - 1.2 mg/dL 1.1  1.1  0.9   Alkaline Phos 44 - 121 IU/L 108  110  99   AST 0 - 40 IU/L 16  11  14    ALT 0 - 44 IU/L 18  18  21     Lipid Panel     Component Value Date/Time   CHOL 120 09/23/2022 1030   TRIG 138 09/23/2022 1030   HDL 31 (L) 09/23/2022 1030   CHOLHDL 3.9 09/23/2022 1030   CHOLHDL 7.6 09/10/2017 0451   VLDL 29 09/10/2017 0451   LDLCALC 65 09/23/2022 1030    CBC    Component Value Date/Time   WBC 6.1 09/23/2022 1030   WBC 9.3 03/17/2019 1900   RBC 6.34 (H) 09/23/2022 1030   RBC 6.43 (H) 03/17/2019 1900   HGB 15.7 09/23/2022 1030   HCT 45.4 09/23/2022 1030   PLT 234 09/23/2022 1030   MCV 72 (L) 09/23/2022 1030   MCH 24.8 (  L) 09/23/2022 1030   MCH 24.3 (L) 03/17/2019 1900   MCHC 34.6 09/23/2022 1030   MCHC 34.8 03/17/2019 1900   RDW 17.1 (H) 09/23/2022 1030   LYMPHSABS 1.9 11/01/2015 1043   MONOABS 0.6 11/01/2015 1043   EOSABS 0.1 11/01/2015 1043   BASOSABS 0.1 11/01/2015 1043    ASSESSMENT AND PLAN: 1. Type 2 diabetes mellitus with morbid obesity A1c has improved and is closer to goal. Advised against stopping Lantus insulin.  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  Patient feels that this will not extend a grace period for him and can not afford to be out of work right now. -We agreed to have him stop the insulin for several weeks and  monitor his blood sugars.  If blood sugars start increasing, he will go back on the Lantus insulin.  In the meantime he will continue the Trulicity 3 mg a week, Farxiga 10 mg daily.  We will increase the glipizide to 10 mg twice a day.  Commended him on trying to eat healthy and encouraged him to continue to do so.  We discussed giving him printed information of behavioral health counselors in the area but I forgot to give it to him.  I will have my medical assistant send it to him in the mail. - POCT glucose (manual entry) - POCT glycosylated hemoglobin (Hb A1C) - glipiZIDE (GLUCOTROL) 10 MG tablet; Take 1 tablet (10 mg total) by mouth 2 (two) times daily.  Dispense: 60 tablet; Refill: 4  2. Hypertension associated with diabetes Not at goal but improved on repeat testing.  He will continue current medications listed above.    Patient was given the opportunity to ask questions.  Patient verbalized understanding of the plan and was able to repeat key elements of the plan.   This documentation was completed using Paediatric nurse.  Any transcriptional errors are unintentional.  Orders Placed This Encounter  Procedures   POCT glucose (manual entry)   POCT glycosylated hemoglobin (Hb A1C)     Requested Prescriptions   Signed Prescriptions Disp Refills   glipiZIDE (GLUCOTROL) 10 MG tablet 60 tablet 4    Sig: Take 1 tablet (10 mg total) by mouth 2 (two) times daily.    No follow-ups on file.  [name], MD, FACP

## 2022-12-09 ENCOUNTER — Telehealth: Payer: Self-pay | Admitting: Internal Medicine

## 2022-12-09 ENCOUNTER — Encounter: Payer: Self-pay | Admitting: Internal Medicine

## 2022-12-12 ENCOUNTER — Other Ambulatory Visit: Payer: Self-pay

## 2022-12-12 NOTE — Telephone Encounter (Signed)
Called & spoke to the patient. Verified name & DOB. Informed that letter is ready for pick-up. Patient expressed verbal understanding.

## 2022-12-13 ENCOUNTER — Encounter: Payer: Self-pay | Admitting: Cardiovascular Disease

## 2022-12-13 ENCOUNTER — Ambulatory Visit: Payer: Medicaid Other | Attending: Cardiovascular Disease | Admitting: Cardiovascular Disease

## 2022-12-13 VITALS — BP 106/76 | HR 91 | Ht 72.0 in | Wt >= 6400 oz

## 2022-12-13 DIAGNOSIS — G4733 Obstructive sleep apnea (adult) (pediatric): Secondary | ICD-10-CM

## 2022-12-13 DIAGNOSIS — I10 Essential (primary) hypertension: Secondary | ICD-10-CM

## 2022-12-13 DIAGNOSIS — I7121 Aneurysm of the ascending aorta, without rupture: Secondary | ICD-10-CM | POA: Insufficient documentation

## 2022-12-13 DIAGNOSIS — I5032 Chronic diastolic (congestive) heart failure: Secondary | ICD-10-CM

## 2022-12-13 DIAGNOSIS — E785 Hyperlipidemia, unspecified: Secondary | ICD-10-CM | POA: Insufficient documentation

## 2022-12-13 DIAGNOSIS — I517 Cardiomegaly: Secondary | ICD-10-CM

## 2022-12-13 DIAGNOSIS — E782 Mixed hyperlipidemia: Secondary | ICD-10-CM

## 2022-12-13 NOTE — Patient Instructions (Signed)
Medication Instructions:  Your physician recommends that you continue on your current medications as directed. Please refer to the Current Medication list given to you today.  *If you need a refill on your cardiac medications before your next appointment, please call your pharmacy*   Testing/Procedures: Your physician has requested that you have an echocardiogram. Echocardiography is a painless test that uses sound waves to create images of your heart. It provides your doctor with information about the size and shape of your heart and how well your heart's chambers and valves are working. This procedure takes approximately one hour. There are no restrictions for this procedure. Please do NOT wear cologne, perfume, aftershave, or lotions (deodorant is allowed). Please arrive 15 minutes prior to your appointment time. This will take place at [address]    Follow-Up: At [facility], you and your health needs are our priority.  As part of our continuing mission to provide you with exceptional heart care, we have created designated Provider Care Teams.  These Care Teams include your primary Cardiologist (physician) and Advanced Practice Providers (APPs -  Physician Assistants and Nurse Practitioners) who all work together to provide you with the care you need, when you need it.  We recommend signing up for the patient portal called "MyChart".  Sign up information is provided on this After Visit Summary.  MyChart is used to connect with patients for Virtual Visits (Telemedicine).  Patients are able to view lab/test results, encounter notes, upcoming appointments, etc.  Non-urgent messages can be sent to your provider as well.   To learn more about what you can do with MyChart, go to [email or web address].    Your next appointment:   12 month(s)  Provider:   Nanetta Batty, MD

## 2022-12-13 NOTE — Assessment & Plan Note (Signed)
History of morbid obesity with a BMI of 54.  I am going to refer him to the [facility] diet wellness center for physician assisted weight loss.  He may be a candidate for a GLP-1 agonist.

## 2022-12-13 NOTE — Assessment & Plan Note (Signed)
History of essential hypertension blood pressure measured today at 106/76.  He is on amlodipine, carvedilol, hydralazine, hydrochlorothiazide and losartan.

## 2022-12-13 NOTE — Assessment & Plan Note (Signed)
History of obstructive sleep apnea on CPAP. 

## 2022-12-13 NOTE — Assessment & Plan Note (Signed)
History of hyperlipidemia on statin therapy with lipid profile performed 09/23/2022 revealing total cholesterol 120, LDL 65 and HDL 31.

## 2022-12-13 NOTE — Assessment & Plan Note (Signed)
Recent 2D echo performed 01/25/2021 showed severe LVH.  This will be repeated.

## 2022-12-13 NOTE — Assessment & Plan Note (Signed)
Ascending thoracic aorta measured 40 mm by 2D echo 01/25/2021.  This will be repeated.

## 2022-12-13 NOTE — Progress Notes (Signed)
12/13/2022 Paul Lamb [facility]   05-05-1986  [id number]  Primary Physician Marcine Matar, MD Primary Cardiologist: Runell Gess MD Nicholes Calamity, [place]  HPI:  Paul Lamb is a 37 y.o. morbidly overweight married African-American male father of 4 children who works as a Agricultural consultant.  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  His risk factors include treated diabetes, hypertension and hyperlipidemia.  He does not smoke.  He does have obstructive sleep apnea on CPAP.  He denies chest pain or shortness of breath.  2D echo performed 01/25/2021 revealed normal LV systolic function with severe LVH and a small ascending thoracic aortic aneurysm measuring 40 mm.   Current Meds  Medication Sig   amLODipine (NORVASC) 10 MG tablet Take 1 tablet (10 mg total) by mouth daily.   aspirin 81 MG EC tablet Take 1 tablet (81 mg total) by mouth daily.   atorvastatin (LIPITOR) 40 MG tablet Take 1 tablet (40 mg total) by mouth daily at 6 PM.   carvedilol (COREG) 25 MG tablet Take 1 tablet (25 mg total) by mouth 2 (two) times daily with a meal.   Continuous Blood Gluc Receiver (DEXCOM G7 RECEIVER) DEVI UAD   Continuous Blood Gluc Sensor (DEXCOM G7 SENSOR) MISC Change sensor Q 10 days   dapagliflozin propanediol (FARXIGA) 10 MG TABS tablet Take 1 tablet (10 mg total) by mouth daily.   Dulaglutide (TRULICITY) 3 MG/0.5ML SOPN Inject 3 mg as directed once a week.   furosemide (LASIX) 20 MG tablet Take 1 tablet (20 mg total) by mouth daily.   glipiZIDE (GLUCOTROL) 10 MG tablet Take 1 tablet (10 mg total) by mouth 2 (two) times daily.   hydrALAZINE (APRESOLINE) 100 MG tablet Take 1 tablet (100 mg total) by mouth 3 (three) times daily.   hydrochlorothiazide (HYDRODIURIL) 25 MG tablet Take 1 tablet (25 mg total) by mouth daily.   Insulin Pen Needle (TECHLITE PEN NEEDLES) 32G X 4 MM MISC Use to inject Lantus once  daily.   losartan (COZAAR) 100 MG tablet Take 1 tablet (100 mg total) by mouth daily.   metFORMIN (GLUCOPHAGE) 1000 MG tablet Take 1 tablet (1,000 mg total) by mouth 2 (two) times daily with a meal.   sildenafil (VIAGRA) 100 MG tablet TAKE 1 TABLET 1/2 TO 1 HOUR PRIOR TO INTERCOURSE AS NEEDED     Allergies  Allergen Reactions   Imdur [Isosorbide Dinitrate] Other (See Comments)    Headaches    Penicillins Other (See Comments)    From childhood; reaction not known: Has patient had a PCN reaction causing immediate rash, facial/tongue/throat swelling, SOB or lightheadedness with hypotension: Unk Has patient had a PCN reaction causing severe rash involving mucus membranes or skin necrosis: Unk Has patient had a PCN reaction that required hospitalization: Unk Has patient had a PCN reaction occurring within the last 10 years: No If all of the above answers are "NO", then may proceed with Cephalosporin use.     Social History   Socioeconomic History   Marital status: Single    Spouse name: Not on file   Number of children: Not on file   Years of education: Not on file   Highest education level: Not on file  Occupational History   Not on file  Tobacco Use   Smoking status: Never   Smokeless tobacco: Never  Vaping Use  Vaping Use: Never used  Substance and Sexual Activity   Alcohol use: Yes    Alcohol/week: 0.0 standard drinks of alcohol   Drug use: No   Sexual activity: Yes  Other Topics Concern   Not on file  Social History Narrative   Not on file   Social Determinants of Health   Financial Resource Strain: Not on file  Food Insecurity: Not on file  Transportation Needs: Not on file  Physical Activity: Inactive (09/10/2017)   Exercise Vital Sign    Days of Exercise per Week: 0 days    Minutes of Exercise per Session: 0 min  Stress: Stress Concern Present (09/10/2017)   [employer] of Occupational Health - Occupational Stress Questionnaire    Feeling of Stress :  To some extent  Social Connections: Unknown (09/10/2017)   Social Connection and Isolation Panel [NHANES]    Frequency of Communication with Friends and Family: Patient declined    Frequency of Social Gatherings with Friends and Family: Patient declined    Attends Religious Services: Patient declined    Database administrator or Organizations: Patient declined    Attends Banker Meetings: Patient declined    Marital Status: Patient declined  Intimate Partner Violence: Not At Risk (09/10/2017)   Humiliation, Afraid, Rape, and Kick questionnaire    Fear of Current or Ex-Partner: No    Emotionally Abused: No    Physically Abused: No    Sexually Abused: No     Review of Systems: General: negative for chills, fever, night sweats or weight changes.  Cardiovascular: negative for chest pain, dyspnea on exertion, edema, orthopnea, palpitations, paroxysmal nocturnal dyspnea or shortness of breath Dermatological: negative for rash Respiratory: negative for cough or wheezing Urologic: negative for hematuria Abdominal: negative for nausea, vomiting, diarrhea, bright red blood per rectum, melena, or hematemesis Neurologic: negative for visual changes, syncope, or dizziness All other systems reviewed and are otherwise negative except as noted above.    Blood pressure 106/76, pulse 91, height 6' (1.829 m), weight (!) 401 lb 6.4 oz (182.1 kg), SpO2 94 %.  General appearance: alert and no distress Neck: no adenopathy, no carotid bruit, no JVD, supple, symmetrical, trachea midline, and thyroid not enlarged, symmetric, no tenderness/mass/nodules Lungs: clear to auscultation bilaterally Heart: regular rate and rhythm, S1, S2 normal, no murmur, click, rub or gallop Extremities: extremities normal, atraumatic, no cyanosis or edema Pulses: 2+ and symmetric Skin: Skin color, texture, turgor normal. No rashes or lesions Neurologic: Grossly normal  EKG send 91 with septal Q waves.  I  personally reviewed this EKG.  ASSESSMENT AND PLAN:   Sleep apnea History of obstructive sleep apnea on CPAP.  Morbid obesity History of morbid obesity with a BMI of 54.  I am going to refer him to the [facility] diet wellness center for physician assisted weight loss.  He may be a candidate for a GLP-1 agonist.  Essential hypertension History of essential hypertension blood pressure measured today at 106/76.  He is on amlodipine, carvedilol, hydralazine, hydrochlorothiazide and losartan.  Diastolic congestive heart failure History of diastolic dysfunction on oral diuretics.  Left ventricular hypertrophy Recent 2D echo performed 01/25/2021 showed severe LVH.  This will be repeated.  Thoracic ascending aortic aneurysm Ascending thoracic aorta measured 40 mm by 2D echo 01/25/2021.  This will be repeated.  Hyperlipidemia History of hyperlipidemia on statin therapy with lipid profile performed 09/23/2022 revealing total cholesterol 120, LDL 65 and HDL 31.     Runell Gess  MD Nicholes Calamity, [facility] 12/13/2022 11:46 AM

## 2022-12-13 NOTE — Assessment & Plan Note (Signed)
History of diastolic dysfunction on oral diuretics. 

## 2022-12-14 ENCOUNTER — Encounter: Payer: Self-pay | Admitting: Internal Medicine

## 2022-12-19 ENCOUNTER — Ambulatory Visit: Payer: Medicaid Other | Admitting: Sports Medicine

## 2022-12-20 ENCOUNTER — Ambulatory Visit: Payer: Medicaid Other | Admitting: Sports Medicine

## 2022-12-20 ENCOUNTER — Encounter: Payer: Self-pay | Admitting: Family Medicine

## 2022-12-21 ENCOUNTER — Encounter: Payer: Self-pay | Admitting: Family Medicine

## 2023-01-12 ENCOUNTER — Ambulatory Visit (HOSPITAL_COMMUNITY): Payer: Medicaid Other | Attending: Cardiology

## 2023-01-12 ENCOUNTER — Encounter: Payer: Self-pay | Admitting: Cardiovascular Disease

## 2023-01-12 DIAGNOSIS — I7121 Aneurysm of the ascending aorta, without rupture: Secondary | ICD-10-CM

## 2023-01-12 DIAGNOSIS — I517 Cardiomegaly: Secondary | ICD-10-CM | POA: Insufficient documentation

## 2023-01-12 DIAGNOSIS — G4733 Obstructive sleep apnea (adult) (pediatric): Secondary | ICD-10-CM | POA: Diagnosis not present

## 2023-01-12 DIAGNOSIS — I5032 Chronic diastolic (congestive) heart failure: Secondary | ICD-10-CM | POA: Insufficient documentation

## 2023-01-12 DIAGNOSIS — I10 Essential (primary) hypertension: Secondary | ICD-10-CM | POA: Diagnosis present

## 2023-01-12 LAB — ECHOCARDIOGRAM COMPLETE
Area-P 1/2: 6.32 cm2
S' Lateral: 3 cm

## 2023-01-12 MED ORDER — PERFLUTREN LIPID MICROSPHERE
1.0000 mL | INTRAVENOUS | Status: AC | PRN
Start: 2023-01-12 — End: 2023-01-12
  Administered 2023-01-12: 2 mL via INTRAVENOUS

## 2023-01-20 ENCOUNTER — Other Ambulatory Visit: Payer: Self-pay

## 2023-01-20 ENCOUNTER — Other Ambulatory Visit: Payer: Self-pay | Admitting: Internal Medicine

## 2023-01-20 MED ORDER — SILDENAFIL CITRATE 100 MG PO TABS
100.0000 mg | ORAL_TABLET | ORAL | 2 refills | Status: DC
  Filled 2023-01-20: qty 10, 10d supply, fill #0
  Filled 2023-01-27: qty 10, 30d supply, fill #0
  Filled 2023-04-27 (×2): qty 10, 30d supply, fill #1
  Filled 2023-08-10: qty 10, 30d supply, fill #2

## 2023-01-23 ENCOUNTER — Other Ambulatory Visit: Payer: Self-pay

## 2023-01-23 ENCOUNTER — Encounter: Payer: Medicaid Other | Admitting: Bariatrics

## 2023-01-24 ENCOUNTER — Other Ambulatory Visit: Payer: Self-pay

## 2023-01-25 ENCOUNTER — Other Ambulatory Visit: Payer: Self-pay

## 2023-01-26 ENCOUNTER — Other Ambulatory Visit: Payer: Self-pay

## 2023-01-27 ENCOUNTER — Other Ambulatory Visit: Payer: Self-pay

## 2023-01-28 ENCOUNTER — Ambulatory Visit (HOSPITAL_COMMUNITY)
Admission: EM | Admit: 2023-01-28 | Discharge: 2023-01-28 | Discharge status: Absent without leave | Payer: Medicaid Other

## 2023-01-28 ENCOUNTER — Other Ambulatory Visit: Payer: Self-pay

## 2023-01-28 ENCOUNTER — Emergency Department (HOSPITAL_COMMUNITY)
Admission: EM | Admit: 2023-01-28 | Discharge: 2023-01-28 | Disposition: A | Discharge status: Home / Self Care | Payer: Medicaid Other | Attending: Emergency Medicine | Admitting: Emergency Medicine

## 2023-01-28 ENCOUNTER — Emergency Department (HOSPITAL_COMMUNITY): Payer: Medicaid Other

## 2023-01-28 DIAGNOSIS — Z7984 Long term (current) use of oral hypoglycemic drugs: Secondary | ICD-10-CM | POA: Diagnosis not present

## 2023-01-28 DIAGNOSIS — Y92410 Unspecified street and highway as the place of occurrence of the external cause: Secondary | ICD-10-CM | POA: Insufficient documentation

## 2023-01-28 DIAGNOSIS — E119 Type 2 diabetes mellitus without complications: Secondary | ICD-10-CM | POA: Diagnosis not present

## 2023-01-28 DIAGNOSIS — Z79899 Other long term (current) drug therapy: Secondary | ICD-10-CM | POA: Insufficient documentation

## 2023-01-28 DIAGNOSIS — Z7982 Long term (current) use of aspirin: Secondary | ICD-10-CM | POA: Diagnosis not present

## 2023-01-28 DIAGNOSIS — R519 Headache, unspecified: Secondary | ICD-10-CM | POA: Diagnosis not present

## 2023-01-28 DIAGNOSIS — Z794 Long term (current) use of insulin: Secondary | ICD-10-CM | POA: Insufficient documentation

## 2023-01-28 DIAGNOSIS — R0789 Other chest pain: Secondary | ICD-10-CM | POA: Insufficient documentation

## 2023-01-28 DIAGNOSIS — I110 Hypertensive heart disease with heart failure: Secondary | ICD-10-CM | POA: Insufficient documentation

## 2023-01-28 DIAGNOSIS — I509 Heart failure, unspecified: Secondary | ICD-10-CM | POA: Diagnosis not present

## 2023-01-28 DIAGNOSIS — M25531 Pain in right wrist: Secondary | ICD-10-CM | POA: Diagnosis not present

## 2023-01-28 LAB — CBC WITH DIFFERENTIAL/PLATELET
Abs Immature Granulocytes: 0.01 10*3/uL (ref 0.00–0.07)
Basophils Absolute: 0.1 10*3/uL (ref 0.0–0.1)
Basophils Relative: 1 %
Eosinophils Absolute: 0.2 10*3/uL (ref 0.0–0.5)
Eosinophils Relative: 3 %
HCT: 45.2 % (ref 39.0–52.0)
Hemoglobin: 15.8 g/dL (ref 13.0–17.0)
Immature Granulocytes: 0 %
Lymphocytes Relative: 25 %
Lymphs Abs: 1.4 10*3/uL (ref 0.7–4.0)
MCH: 25.2 pg — ABNORMAL LOW (ref 26.0–34.0)
MCHC: 35 g/dL (ref 30.0–36.0)
MCV: 72 fL — ABNORMAL LOW (ref 80.0–100.0)
Monocytes Absolute: 0.5 10*3/uL (ref 0.1–1.0)
Monocytes Relative: 9 %
Neutro Abs: 3.6 10*3/uL (ref 1.7–7.7)
Neutrophils Relative %: 62 %
Platelets: 242 10*3/uL (ref 150–400)
RBC: 6.28 MIL/uL — ABNORMAL HIGH (ref 4.22–5.81)
RDW: 14.3 % (ref 11.5–15.5)
WBC: 5.7 10*3/uL (ref 4.0–10.5)
nRBC: 0 % (ref 0.0–0.2)

## 2023-01-28 LAB — COMPREHENSIVE METABOLIC PANEL
ALT: 31 U/L (ref 0–44)
AST: 23 U/L (ref 15–41)
Albumin: 4.2 g/dL (ref 3.5–5.0)
Alkaline Phosphatase: 80 U/L (ref 38–126)
Anion gap: 16 — ABNORMAL HIGH (ref 5–15)
BUN: 14 mg/dL (ref 6–20)
CO2: 22 mmol/L (ref 22–32)
Calcium: 9.9 mg/dL (ref 8.9–10.3)
Chloride: 102 mmol/L (ref 98–111)
Creatinine, Ser: 1.05 mg/dL (ref 0.61–1.24)
GFR, Estimated: >60 mL/min (ref >60)
Glucose, Bld: 130 mg/dL — ABNORMAL HIGH (ref 70–99)
Potassium: 3.6 mmol/L (ref 3.5–5.1)
Sodium: 140 mmol/L (ref 135–145)
Total Bilirubin: 1.7 mg/dL — ABNORMAL HIGH (ref 0.3–1.2)
Total Protein: 7.3 g/dL (ref 6.5–8.1)

## 2023-01-28 LAB — TROPONIN I (HIGH SENSITIVITY): Troponin I (High Sensitivity): 10 ng/L (ref <18)

## 2023-01-28 MED ORDER — KETOROLAC TROMETHAMINE 15 MG/ML IJ SOLN
15.0000 mg | Freq: Once | INTRAMUSCULAR | Status: AC
  Administered 2023-01-28: 15 mg via INTRAVENOUS
  Filled 2023-01-28: qty 1
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
## 2023-01-30 ENCOUNTER — Other Ambulatory Visit: Payer: Self-pay

## 2023-01-30 ENCOUNTER — Encounter: Payer: Self-pay | Admitting: Internal Medicine

## 2023-01-30 ENCOUNTER — Ambulatory Visit: Payer: Medicaid Other | Attending: Internal Medicine | Admitting: Internal Medicine

## 2023-01-30 DIAGNOSIS — I5030 Unspecified diastolic (congestive) heart failure: Secondary | ICD-10-CM | POA: Diagnosis not present

## 2023-01-30 DIAGNOSIS — E1169 Type 2 diabetes mellitus with other specified complication: Secondary | ICD-10-CM | POA: Diagnosis not present

## 2023-01-30 DIAGNOSIS — E1159 Type 2 diabetes mellitus with other circulatory complications: Secondary | ICD-10-CM | POA: Diagnosis not present

## 2023-01-30 DIAGNOSIS — S63501A Unspecified sprain of right wrist, initial encounter: Secondary | ICD-10-CM

## 2023-01-30 DIAGNOSIS — I152 Hypertension secondary to endocrine disorders: Secondary | ICD-10-CM

## 2023-01-30 DIAGNOSIS — Z7984 Long term (current) use of oral hypoglycemic drugs: Secondary | ICD-10-CM

## 2023-01-30 LAB — GLUCOSE, POCT (MANUAL RESULT ENTRY): POC Glucose: 162 mg/dl — AB (ref 70–99)

## 2023-01-30 NOTE — Patient Instructions (Signed)
Try to get fasting blood sugars between 90-130.  Ice your wrist intermittently over the next 2 to 3 days and then after that use heat.  If discomfort does not resolve, please let me know and we can refer you to orthopedics.
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
## 2023-02-02 ENCOUNTER — Telehealth: Payer: Self-pay | Admitting: Internal Medicine

## 2023-02-02 ENCOUNTER — Encounter: Payer: Self-pay | Admitting: Internal Medicine

## 2023-02-08 NOTE — Telephone Encounter (Signed)
Called & spoke to the patient. Verified name & DOB. Informed patient that a signature is needed in order to appeal on his behalf for the Farxiga medication. Patient stated that he is not in town this week but will be back next week and will stop by to sign paperwork.

## 2023-02-08 NOTE — Telephone Encounter (Signed)
Called & LVM for the patient to come in person to sign paperwork to do an appeal on his behalf.

## 2023-02-13 ENCOUNTER — Ambulatory Visit: Payer: Worker's Compensation | Admitting: Sports Medicine

## 2023-02-17 ENCOUNTER — Ambulatory Visit: Payer: Worker's Compensation | Admitting: Sports Medicine

## 2023-02-20 NOTE — Telephone Encounter (Signed)
Forms signed on 02/20/2023. Forms successfully faxed to [employer] on 02/20/2023.

## 2023-02-21 ENCOUNTER — Telehealth: Payer: Self-pay | Admitting: Internal Medicine

## 2023-02-21 NOTE — Telephone Encounter (Signed)
noted 

## 2023-02-21 NOTE — Telephone Encounter (Signed)
Sue Lush with the [employer] appeals team called in due to an appeal that Clarissa put in so the patient could get Farxiga 10mg . She states it has already been approved and the reference number is [id number]. Please assist further if any questions.

## 2023-02-27 ENCOUNTER — Other Ambulatory Visit: Payer: Self-pay

## 2023-02-27 NOTE — Telephone Encounter (Signed)
Called & spoke to the patient. Verified name & DOB. Informed that Paul Lamb was approved by insurance and will be ready at the pharmacy with a $4 co-pay. Patient expressed verbal understanding.

## 2023-03-01 ENCOUNTER — Encounter: Payer: Self-pay | Admitting: Internal Medicine

## 2023-03-01 ENCOUNTER — Other Ambulatory Visit: Payer: Self-pay | Admitting: Internal Medicine

## 2023-03-03 ENCOUNTER — Other Ambulatory Visit: Payer: Self-pay

## 2023-03-03 ENCOUNTER — Ambulatory Visit (INDEPENDENT_AMBULATORY_CARE_PROVIDER_SITE_OTHER): Payer: Medicaid Other | Admitting: Sports Medicine

## 2023-03-03 ENCOUNTER — Encounter: Payer: Self-pay | Admitting: Sports Medicine

## 2023-03-03 ENCOUNTER — Other Ambulatory Visit (INDEPENDENT_AMBULATORY_CARE_PROVIDER_SITE_OTHER): Payer: Medicaid Other

## 2023-03-03 DIAGNOSIS — S62024G Nondisplaced fracture of middle third of navicular [scaphoid] bone of right wrist, subsequent encounter for fracture with delayed healing: Secondary | ICD-10-CM | POA: Diagnosis not present

## 2023-03-03 DIAGNOSIS — S62021G Displaced fracture of middle third of navicular [scaphoid] bone of right wrist, subsequent encounter for fracture with delayed healing: Secondary | ICD-10-CM

## 2023-03-03 DIAGNOSIS — M25531 Pain in right wrist: Secondary | ICD-10-CM

## 2023-03-03 DIAGNOSIS — S52551D Other extraarticular fracture of lower end of right radius, subsequent encounter for closed fracture with routine healing: Secondary | ICD-10-CM
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
## 2023-03-13 ENCOUNTER — Other Ambulatory Visit: Payer: Medicaid Other

## 2023-03-16 ENCOUNTER — Telehealth: Payer: Medicaid Other | Admitting: Internal Medicine

## 2023-03-20 ENCOUNTER — Ambulatory Visit: Payer: Medicaid Other | Attending: Internal Medicine

## 2023-03-20 ENCOUNTER — Telehealth (HOSPITAL_BASED_OUTPATIENT_CLINIC_OR_DEPARTMENT_OTHER): Payer: Medicaid Other | Admitting: Internal Medicine

## 2023-03-20 ENCOUNTER — Other Ambulatory Visit: Payer: Self-pay

## 2023-03-20 ENCOUNTER — Other Ambulatory Visit: Payer: Medicaid Other

## 2023-03-20 DIAGNOSIS — E1169 Type 2 diabetes mellitus with other specified complication: Secondary | ICD-10-CM

## 2023-03-20 DIAGNOSIS — R718 Other abnormality of red blood cells: Secondary | ICD-10-CM

## 2023-03-20 DIAGNOSIS — E119 Type 2 diabetes mellitus without complications: Secondary | ICD-10-CM

## 2023-03-20 DIAGNOSIS — Z7985 Long-term (current) use of injectable non-insulin antidiabetic drugs: Secondary | ICD-10-CM

## 2023-03-20 LAB — HEMOGLOBIN A1C
Est. average glucose Bld gHb Est-mCnc: 200 mg/dL
Hgb A1c MFr Bld: 8.6 % — ABNORMAL HIGH (ref 4.8–5.6)

## 2023-03-20 LAB — HGB FRACTIONATION CASCADE

## 2023-03-20 LAB — IRON,TIBC AND FERRITIN PANEL

## 2023-03-20 MED ORDER — FREESTYLE LIBRE READER DEVI
1.0000 | Freq: Once | 0 refills | Status: AC
Start: 2023-03-20 — End: 2023-03-20

## 2023-03-20 MED ORDER — FREESTYLE LIBRE SENSOR SYSTEM MISC
12 refills | Status: DC
Start: 2023-03-20 — End: 2023-04-28

## 2023-03-20 NOTE — Progress Notes (Signed)
Patient ID: Paul Lamb, male   DOB: [birth year], 37 y.o.   MRN: [id number] Virtual Visit via Video Note  I connected with Paul Lamb on 03/20/2023 at 8:15 a.m by a video enabled telemedicine application and verified that I am speaking with the correct person using two identifiers.  Location: Patient: office at work Provider: Office   I discussed the limitations of evaluation and management by telemedicine and the availability of in person appointments. The patient expressed understanding and agreed to proceed.  History of Present Illness: Patient with history of HTN, diastolic CHF with EF 60-65% with G1DD, HTN, OSA on CPAP, HL, morbid obesity, diabetes with neuropathy, depression.  This is an urgent care visit.  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 Last A1c done in June was 7.5.  Lantus insulin was discontinued prior to that at patient's request.  He was continued on Trulicity 3 mg weekly, Farxiga 10 mg daily and Glucotrol 10 mg twice a day.  Patient was not able to get the Dexcom due to cost.  Requests that I send a prescription for this CGM Libre device instead as it is a little cheaper than the Dexcom. Still taking Trulicity 3 mg once a week, Farxiga 10 mg daily and Glucotrol 10 mg twice a day consistently. Checks blood sugars before breakfast and after dinner.  Before breakfast range is in the 140s and after dinner range is the same. Doing a better job with decrease portion sizes. Walks every morning for 1 mile sometimes more. No scale to weigh himself. I asked about the report from the place where he had his eye exam done.  He states he will go why they are and get a copy for our office so that I can update his health maintenance. Will be coming to our lab this morning to have his A1c done.   Outpatient Encounter Medications as of 03/20/2023  Medication Sig   amLODipine (NORVASC) 10 MG tablet Take 1 tablet (10 mg total) by mouth daily.   aspirin 81 MG EC tablet  Take 1 tablet (81 mg total) by mouth daily.   atorvastatin (LIPITOR) 40 MG tablet Take 1 tablet (40 mg total) by mouth daily at 6 PM.   carvedilol (COREG) 25 MG tablet Take 1 tablet (25 mg total) by mouth 2 (two) times daily with a meal.   Continuous Blood Gluc Receiver (DEXCOM G7 RECEIVER) DEVI UAD   Continuous Blood Gluc Sensor (DEXCOM G7 SENSOR) MISC Change sensor Q 10 days   dapagliflozin propanediol (FARXIGA) 10 MG TABS tablet Take 1 tablet (10 mg total) by mouth daily.   Dulaglutide (TRULICITY) 3 MG/0.5ML SOPN Inject 3 mg as directed once a week.   furosemide (LASIX) 20 MG tablet Take 1 tablet (20 mg total) by mouth daily.   glipiZIDE (GLUCOTROL) 10 MG tablet Take 1 tablet (10 mg total) by mouth 2 (two) times daily.   hydrALAZINE (APRESOLINE) 100 MG tablet Take 1 tablet (100 mg total) by mouth 3 (three) times daily.   hydrochlorothiazide (HYDRODIURIL) 25 MG tablet Take 1 tablet (25 mg total) by mouth daily.   Insulin Pen Needle (TECHLITE PEN NEEDLES) 32G X 4 MM MISC Use to inject Lantus once daily.   losartan (COZAAR) 100 MG tablet Take 1 tablet (100 mg total) by mouth daily.   metFORMIN (GLUCOPHAGE) 1000 MG tablet Take 1 tablet (1,000 mg total) by mouth 2 (two) times daily with a meal.   sildenafil (VIAGRA) 100 MG tablet Take 1  tablet (100 mg total) by mouth 30 minutes to 1 hour before intercourse as needed.   No facility-administered encounter medications on file as of 03/20/2023.      Observations/Objective: Young to middle-aged African-American male sitting down and appears in NAD.  Assessment and Plan: 1. Type 2 diabetes mellitus with morbid obesity (HCC) 2. Long-term (current) use of injectable non-insulin antidiabetic drugs 3. Diabetes mellitus treated with oral medication (HCC) -Advised patient that fasting morning blood sugars goal is 90-130.  He is a little above that.  We will see what his A1c looks like today and will get back to him with results and further  recommendations. He will continue current dose of Trulicity 3 mg once a week, Farxiga 10 mg daily and Glucotrol 10 mg twice a day.  Encouraged him to continue regular exercise and healthy eating habits. Prescription for the [facility] device sent to his pharmacy.   Follow Up Instructions: As previously scheduled.   I discussed the assessment and treatment plan with the patient. The patient was provided an opportunity to ask questions and all were answered. The patient agreed with the plan and demonstrated an understanding of the instructions.   The patient was advised to call back or seek an in-person evaluation if the symptoms worsen or if the condition fails to improve as anticipated.  I spent 8 minutes dedicated to the care of this patient on the date of this encounter to include previsit review of of chart, face-to-face time with patient and postvisit entering of orders.  This note has been created with Education officer, environmental. Any transcriptional errors are unintentional.  [name], MD

## 2023-03-29 ENCOUNTER — Encounter: Payer: Self-pay | Admitting: Internal Medicine

## 2023-03-29 DIAGNOSIS — D582 Other hemoglobinopathies: Secondary | ICD-10-CM | POA: Insufficient documentation

## 2023-03-31 ENCOUNTER — Other Ambulatory Visit: Payer: Medicaid Other

## 2023-04-10 ENCOUNTER — Other Ambulatory Visit: Payer: Medicaid Other

## 2023-04-11 ENCOUNTER — Ambulatory Visit: Payer: Medicaid Other | Admitting: Sports Medicine

## 2023-04-12 ENCOUNTER — Ambulatory Visit: Payer: Medicaid Other | Admitting: Sports Medicine

## 2023-04-14 ENCOUNTER — Ambulatory Visit
Admission: RE | Admit: 2023-04-14 | Discharge: 2023-04-14 | Disposition: A | Discharge status: Home / Self Care | Payer: Worker's Compensation | Source: Ambulatory Visit | Attending: Sports Medicine | Admitting: Sports Medicine

## 2023-04-14 DIAGNOSIS — S62024G Nondisplaced fracture of middle third of navicular [scaphoid] bone of right wrist, subsequent encounter for fracture with delayed healing: Secondary | ICD-10-CM

## 2023-04-27 ENCOUNTER — Encounter: Payer: Self-pay | Admitting: Internal Medicine

## 2023-04-27 ENCOUNTER — Other Ambulatory Visit: Payer: Self-pay | Admitting: Internal Medicine

## 2023-04-27 ENCOUNTER — Other Ambulatory Visit: Payer: Self-pay

## 2023-04-27 DIAGNOSIS — E1169 Type 2 diabetes mellitus with other specified complication: Secondary | ICD-10-CM

## 2023-04-27 DIAGNOSIS — E1159 Type 2 diabetes mellitus with other circulatory complications: Secondary | ICD-10-CM

## 2023-04-27 DIAGNOSIS — I5030 Unspecified diastolic (congestive) heart failure: Secondary | ICD-10-CM

## 2023-04-27 MED ORDER — ATORVASTATIN CALCIUM 40 MG PO TABS
40.0000 mg | ORAL_TABLET | Freq: Every day | ORAL | 1 refills | Status: DC
Start: 2023-04-27 — End: 2023-10-27
  Filled 2023-04-27: qty 90, 90d supply, fill #0
  Filled 2023-08-10: qty 90, 90d supply, fill #1

## 2023-04-27 MED ORDER — HYDROCHLOROTHIAZIDE 25 MG PO TABS
25.0000 mg | ORAL_TABLET | Freq: Every day | ORAL | 1 refills | Status: DC
  Filled 2023-04-27: qty 90, 90d supply, fill #0
  Filled 2023-08-10: qty 90, 90d supply, fill #1

## 2023-04-27 MED ORDER — DAPAGLIFLOZIN PROPANEDIOL 10 MG PO TABS
10.0000 mg | ORAL_TABLET | Freq: Every day | ORAL | 1 refills | Status: DC
Start: 2023-04-27 — End: 2023-10-27
  Filled 2023-04-27: qty 90, 90d supply, fill #0
  Filled 2023-08-10: qty 90, 90d supply, fill #1

## 2023-04-27 MED ORDER — CARVEDILOL 25 MG PO TABS
25.0000 mg | ORAL_TABLET | Freq: Two times a day (BID) | ORAL | 1 refills | Status: DC
Start: 2023-04-27 — End: 2023-10-27
  Filled 2023-04-27 (×2): qty 180, 90d supply, fill #0
  Filled 2023-08-10: qty 180, 90d supply, fill #1

## 2023-04-27 MED ORDER — INSULIN GLARGINE 100 UNITS/ML SOLOSTAR PEN
10.0000 [IU] | PEN_INJECTOR | Freq: Every day | SUBCUTANEOUS | 11 refills | Status: DC
  Filled 2023-04-27: qty 15, 140d supply, fill #0
  Filled 2023-09-01: qty 15, 140d supply, fill #1
  Filled 2023-11-09: qty 15, 140d supply, fill #2
  Filled 2023-11-14: qty 9, 90d supply, fill #2

## 2023-04-27 MED ORDER — FUROSEMIDE 20 MG PO TABS
20.0000 mg | ORAL_TABLET | Freq: Every day | ORAL | 1 refills | Status: DC
Start: 2023-04-27 — End: 2023-10-27
  Filled 2023-04-27: qty 90, 90d supply, fill #0
  Filled 2023-08-10: qty 90, 90d supply, fill #1

## 2023-04-27 MED ORDER — LOSARTAN POTASSIUM 100 MG PO TABS
100.0000 mg | ORAL_TABLET | Freq: Every day | ORAL | 1 refills | Status: DC
Start: 2023-04-27 — End: 2023-10-27
  Filled 2023-04-27: qty 90, 90d supply, fill #0
  Filled 2023-08-10: qty 90, 90d supply, fill #1

## 2023-04-27 MED ORDER — METFORMIN HCL 1000 MG PO TABS
1000.0000 mg | ORAL_TABLET | Freq: Two times a day (BID) | ORAL | 1 refills | Status: DC
Start: 2023-04-27 — End: 2023-10-27
  Filled 2023-04-27: qty 180, 90d supply, fill #0
  Filled 2023-08-10: qty 180, 90d supply, fill #1

## 2023-04-27 MED ORDER — HYDRALAZINE HCL 100 MG PO TABS
100.0000 mg | ORAL_TABLET | Freq: Three times a day (TID) | ORAL | 1 refills | Status: DC
Start: 2023-04-27 — End: 2023-10-27
  Filled 2023-04-27: qty 270, 90d supply, fill #0
  Filled 2023-08-10: qty 270, 90d supply, fill #1

## 2023-04-27 MED ORDER — AMLODIPINE BESYLATE 10 MG PO TABS
10.0000 mg | ORAL_TABLET | Freq: Every day | ORAL | 1 refills | Status: DC
  Filled 2023-04-27: qty 90, 90d supply, fill #0
  Filled 2023-08-10: qty 90, 90d supply, fill #1

## 2023-04-28 ENCOUNTER — Other Ambulatory Visit: Payer: Self-pay | Admitting: Internal Medicine

## 2023-04-28 ENCOUNTER — Ambulatory Visit (INDEPENDENT_AMBULATORY_CARE_PROVIDER_SITE_OTHER): Payer: Medicaid Other | Admitting: Sports Medicine

## 2023-04-28 ENCOUNTER — Other Ambulatory Visit: Payer: Self-pay

## 2023-04-28 ENCOUNTER — Other Ambulatory Visit (HOSPITAL_COMMUNITY): Payer: Self-pay

## 2023-04-28 ENCOUNTER — Encounter: Payer: Self-pay | Admitting: Sports Medicine

## 2023-04-28 DIAGNOSIS — S62024D Nondisplaced fracture of middle third of navicular [scaphoid] bone of right wrist, subsequent encounter for fracture with routine healing: Secondary | ICD-10-CM

## 2023-04-28 DIAGNOSIS — S52551D Other extraarticular fracture of lower end of right radius, subsequent encounter for closed fracture with routine healing: Secondary | ICD-10-CM | POA: Diagnosis not present

## 2023-04-28 DIAGNOSIS — M25531 Pain in right wrist: Secondary | ICD-10-CM

## 2023-04-28 DIAGNOSIS — E1169 Type 2 diabetes mellitus with other specified complication: Secondary | ICD-10-CM

## 2023-04-28 NOTE — Telephone Encounter (Signed)
Requested Prescriptions  Pending Prescriptions Disp Refills   Continuous Glucose Sensor (FREESTYLE LIBRE 2 SENSOR) MISC [Pharmacy Med Name: FREESTYLE LIBRE 2 SENSOR] 2 each 0    Sig: CHANGE SENSOR EVERY 2 WEEKS     Endocrinology: Diabetes - Testing Supplies Passed - 04/28/2023 10:08 AM      Passed - Valid encounter within last 12 months    Recent Outpatient Visits           1 month ago Type 2 diabetes mellitus with morbid obesity (HCC)   [place] [facility] [name] B, MD   2 months ago Type 2 diabetes mellitus with morbid obesity [facility])   [place] [name] B, MD   5 months ago Type 2 diabetes mellitus with morbid obesity [facility])   [facility] [name] B, MD   7 months ago Type 2 diabetes mellitus with morbid obesity [facility])   [place] [facility] [name] B, MD   1 year ago Type 2 diabetes mellitus with morbid obesity [facility])    [facility] Marcine Matar, MD       Future Appointments             In 1 month Laural Benes, Binnie Rail, MD [facility]
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
## 2023-04-29 ENCOUNTER — Emergency Department (HOSPITAL_COMMUNITY)
Admission: EM | Admit: 2023-04-29 | Discharge: 2023-04-29 | Disposition: A | Discharge status: Home / Self Care | Payer: Medicaid Other | Attending: Emergency Medicine | Admitting: Emergency Medicine

## 2023-04-29 ENCOUNTER — Other Ambulatory Visit: Payer: Self-pay

## 2023-04-29 ENCOUNTER — Encounter (HOSPITAL_COMMUNITY): Payer: Self-pay

## 2023-04-29 DIAGNOSIS — E1165 Type 2 diabetes mellitus with hyperglycemia: Secondary | ICD-10-CM | POA: Diagnosis not present

## 2023-04-29 DIAGNOSIS — U071 COVID-19: Secondary | ICD-10-CM | POA: Insufficient documentation

## 2023-04-29 DIAGNOSIS — R059 Cough, unspecified: Secondary | ICD-10-CM | POA: Diagnosis present

## 2023-04-29 DIAGNOSIS — I10 Essential (primary) hypertension: Secondary | ICD-10-CM | POA: Diagnosis not present

## 2023-04-29 DIAGNOSIS — Z794 Long term (current) use of insulin: Secondary | ICD-10-CM | POA: Diagnosis not present

## 2023-04-29 DIAGNOSIS — Z1152 Encounter for screening for COVID-19: Secondary | ICD-10-CM | POA: Diagnosis not present

## 2023-04-29 DIAGNOSIS — Z7982 Long term (current) use of aspirin: Secondary | ICD-10-CM | POA: Diagnosis not present

## 2023-04-29 DIAGNOSIS — R739 Hyperglycemia, unspecified: Secondary | ICD-10-CM

## 2023-04-29 LAB — CBC WITH DIFFERENTIAL/PLATELET
Abs Immature Granulocytes: 0.02 10*3/uL (ref 0.00–0.07)
Basophils Absolute: 0.1 10*3/uL (ref 0.0–0.1)
Basophils Relative: 1 %
Eosinophils Absolute: 0 10*3/uL (ref 0.0–0.5)
Eosinophils Relative: 1 %
HCT: 44 % (ref 39.0–52.0)
Hemoglobin: 15.2 g/dL (ref 13.0–17.0)
Immature Granulocytes: 0 %
Lymphocytes Relative: 28 %
Lymphs Abs: 2 10*3/uL (ref 0.7–4.0)
MCH: 24.3 pg — ABNORMAL LOW (ref 26.0–34.0)
MCHC: 34.5 g/dL (ref 30.0–36.0)
MCV: 70.3 fL — ABNORMAL LOW (ref 80.0–100.0)
Monocytes Absolute: 1.6 10*3/uL — ABNORMAL HIGH (ref 0.1–1.0)
Monocytes Relative: 22 %
Neutro Abs: 3.6 10*3/uL (ref 1.7–7.7)
Neutrophils Relative %: 48 %
Platelets: 198 10*3/uL (ref 150–400)
RBC: 6.26 MIL/uL — ABNORMAL HIGH (ref 4.22–5.81)
RDW: 14.3 % (ref 11.5–15.5)
WBC: 7.3 10*3/uL (ref 4.0–10.5)
nRBC: 0 % (ref 0.0–0.2)

## 2023-04-29 LAB — URINALYSIS, ROUTINE W REFLEX MICROSCOPIC
Bacteria, UA: NONE SEEN
Bilirubin Urine: NEGATIVE
Glucose, UA: >=500 mg/dL — AB
Hgb urine dipstick: NEGATIVE
Ketones, ur: NEGATIVE mg/dL
Leukocytes,Ua: NEGATIVE
Nitrite: NEGATIVE
Protein, ur: NEGATIVE mg/dL
Specific Gravity, Urine: 1.027 (ref 1.005–1.030)
pH: 5 (ref 5.0–8.0)

## 2023-04-29 LAB — I-STAT VENOUS BLOOD GAS, ED
Acid-Base Excess: 1 mmol/L (ref 0.0–2.0)
Bicarbonate: 24.9 mmol/L (ref 20.0–28.0)
Calcium, Ion: 1.14 mmol/L — ABNORMAL LOW (ref 1.15–1.40)
HCT: 44 % (ref 39.0–52.0)
Hemoglobin: 15 g/dL (ref 13.0–17.0)
O2 Saturation: 95 %
Potassium: 3.8 mmol/L (ref 3.5–5.1)
Sodium: 137 mmol/L (ref 135–145)
TCO2: 26 mmol/L (ref 22–32)
pCO2, Ven: 38.3 mmHg — ABNORMAL LOW (ref 44–60)
pH, Ven: 7.422 (ref 7.25–7.43)
pO2, Ven: 72 mmHg — ABNORMAL HIGH (ref 32–45)

## 2023-04-29 LAB — CBG MONITORING, ED
Glucose-Capillary: 313 mg/dL — ABNORMAL HIGH (ref 70–99)
Glucose-Capillary: 248 mg/dL — ABNORMAL HIGH (ref 70–99)
Glucose-Capillary: 251 mg/dL — ABNORMAL HIGH (ref 70–99)
Glucose-Capillary: 235 mg/dL — ABNORMAL HIGH (ref 70–99)

## 2023-04-29 LAB — BASIC METABOLIC PANEL
Anion gap: 9 (ref 5–15)
BUN: 17 mg/dL (ref 6–20)
CO2: 25 mmol/L (ref 22–32)
Calcium: 9 mg/dL (ref 8.9–10.3)
Chloride: 102 mmol/L (ref 98–111)
Creatinine, Ser: 1.4 mg/dL — ABNORMAL HIGH (ref 0.61–1.24)
GFR, Estimated: >60 mL/min (ref >60)
Glucose, Bld: 298 mg/dL — ABNORMAL HIGH (ref 70–99)
Potassium: 3.8 mmol/L (ref 3.5–5.1)
Sodium: 136 mmol/L (ref 135–145)

## 2023-04-29 LAB — BETA-HYDROXYBUTYRIC ACID: Beta-Hydroxybutyric Acid: 0.17 mmol/L (ref 0.05–0.27)

## 2023-04-29 LAB — SARS CORONAVIRUS 2 BY RT PCR: SARS Coronavirus 2 by RT PCR: POSITIVE — AB

## 2023-04-29 MED ORDER — INSULIN ASPART 100 UNIT/ML IJ SOLN
5.0000 [IU] | Freq: Once | INTRAMUSCULAR | Status: AC
  Administered 2023-04-29: 5 [IU] via INTRAVENOUS

## 2023-04-29 NOTE — ED Triage Notes (Addendum)
Pt arrived POV from home c/o a fever and a sore throat for 2 days. Pt also states he is a diabetic and has increased frequency of urination and his blood sugars have been consistently above 300 for several days. Pt had 400mg  of motrin around 730 am.   Pt stated he did take a COVID test yesterday at home and was positive.

## 2023-04-29 NOTE — Discharge Instructions (Addendum)
You tested positive for COVID today.  Please take Tylenol/fever for pain/fever.  You can also try DayQuil/NyQuil or TheraFlu for symptom relief. Please continue take your medications for diabetes as prescribed. I recommend close follow-up with PCP for reevaluation.  Please do not hesitate to return to emergency department if worrisome signs symptoms we discussed become apparent.

## 2023-04-29 NOTE — ED Provider Notes (Signed)
[place] EMERGENCY DEPARTMENT AT [facility] Provider Note   CSN: [id number] Arrival date & time: 04/29/23  1051     History {Add pertinent medical, surgical, social history, OB history to HPI:1} Chief Complaint  Patient presents with   Sore Throat   Fever   Hyperglycemia    Paul Lamb is a 37 y.o. male with a past medical history of type 2 diabetes, hypertension, OSA presents today for evaluation of flulike symptoms and high blood sugar.  Patient states that the last couple of days his sugar has been high, above 300s.  Endorses increased urinary frequency.  He has been compliant with his diabetic medications including insulins, metformin, Trulicity and [place].  He also reports fever, sore throat, cough, body aches in the last couple of days.  He tested positive for COVID yesterday.  He denies any nausea, vomiting, chest pain, shortness of breath, bowel changes, rash.   Sore Throat  Fever Hyperglycemia Associated symptoms: fever    Past Medical History:  Diagnosis Date   Acute combined systolic and diastolic CHF, NYHA class 3 (HCC) 11/18/2016   EF now 40-45% by echo   Acute diastolic (congestive) heart failure (HCC) 01/12/2016   Diabetes mellitus, new onset (HCC)    Hypertension    Morbid obesity (HCC)    OSA (obstructive sleep apnea)    Past Surgical History:  Procedure Laterality Date   NO PAST SURGERIES       Home Medications Prior to Admission medications   Medication Sig Start Date End Date Taking? Authorizing Provider  amLODipine (NORVASC) 10 MG tablet Take 1 tablet (10 mg total) by mouth daily. 04/27/23   Marcine Matar, MD  aspirin 81 MG EC tablet Take 1 tablet (81 mg total) by mouth daily. 12/12/17   Marcine Matar, MD  atorvastatin (LIPITOR) 40 MG tablet Take 1 tablet (40 mg total) by mouth daily at 6 PM. 04/27/23   Marcine Matar, MD  carvedilol (COREG) 25 MG tablet Take 1 tablet (25 mg total) by mouth 2 (two) times daily with a meal.  04/27/23   Marcine Matar, MD  Continuous Glucose Sensor (FREESTYLE LIBRE 2 SENSOR) MISC CHANGE SENSOR EVERY 2 WEEKS 04/28/23   Marcine Matar, MD  dapagliflozin propanediol (FARXIGA) 10 MG TABS tablet Take 1 tablet (10 mg total) by mouth daily. 04/27/23   Marcine Matar, MD  Dulaglutide (TRULICITY) 3 MG/0.5ML SOPN Inject 3 mg as directed once a week. 09/23/22   Marcine Matar, MD  furosemide (LASIX) 20 MG tablet Take 1 tablet (20 mg total) by mouth daily. 04/27/23   Marcine Matar, MD  glipiZIDE (GLUCOTROL) 10 MG tablet Take 1 tablet (10 mg total) by mouth 2 (two) times daily. 11/28/22   Marcine Matar, MD  hydrALAZINE (APRESOLINE) 100 MG tablet Take 1 tablet (100 mg total) by mouth 3 (three) times daily. 04/27/23   Marcine Matar, MD  hydrochlorothiazide (HYDRODIURIL) 25 MG tablet Take 1 tablet (25 mg total) by mouth daily. 04/27/23   Marcine Matar, MD  insulin glargine (LANTUS) 100 unit/mL SOPN Inject 10 Units into the skin at bedtime. 04/27/23   Marcine Matar, MD  Insulin Pen Needle (TECHLITE PEN NEEDLES) 32G X 4 MM MISC Use to inject Lantus once daily. 10/12/22   Marcine Matar, MD  losartan (COZAAR) 100 MG tablet Take 1 tablet (100 mg total) by mouth daily. 04/27/23   Marcine Matar, MD  metFORMIN (GLUCOPHAGE) 1000 MG  tablet Take 1 tablet (1,000 mg total) by mouth 2 (two) times daily with a meal. 04/27/23   Marcine Matar, MD  sildenafil (VIAGRA) 100 MG tablet Take 1 tablet (100 mg total) by mouth 30 minutes to 1 hour before intercourse as needed. 01/20/23   Marcine Matar, MD      Allergies    Imdur [isosorbide dinitrate] and Penicillins    Review of Systems   Review of Systems  Constitutional:  Positive for fever.    Physical Exam Updated Vital Signs BP 133/88 (BP Location: Right Arm)   Pulse 91   Temp 98.9 F (37.2 C) (Oral)   Resp 17   Ht 6' (1.829 m)   Wt (!) 182.3 kg   SpO2 96%   BMI 54.52 kg/m  Physical Exam Vitals and nursing note  reviewed.  Constitutional:      Appearance: Normal appearance.  HENT:     Head: Normocephalic and atraumatic.     Mouth/Throat:     Mouth: Mucous membranes are moist.     Comments: Posterior oropharyngeal erythema. Eyes:     General: No scleral icterus. Cardiovascular:     Rate and Rhythm: Normal rate and regular rhythm.     Pulses: Normal pulses.     Heart sounds: Normal heart sounds.  Pulmonary:     Effort: Pulmonary effort is normal.     Breath sounds: Normal breath sounds.  Abdominal:     General: Abdomen is flat.     Palpations: Abdomen is soft.     Tenderness: There is no abdominal tenderness.  Musculoskeletal:        General: No deformity.  Skin:    General: Skin is warm.     Findings: No rash.  Neurological:     General: No focal deficit present.     Mental Status: He is alert.  Psychiatric:        Mood and Affect: Mood normal.     ED Results / Procedures / Treatments   Labs (all labs ordered are listed, but only abnormal results are displayed) Labs Reviewed  CBG MONITORING, ED - Abnormal; Notable for the following components:      Result Value   Glucose-Capillary 313 (*)    All other components within normal limits    EKG None  Radiology No results found.  Procedures Procedures  {Document cardiac monitor, telemetry assessment procedure when appropriate:1}  Medications Ordered in ED Medications - No data to display  ED Course/ Medical Decision Making/ A&P   {   Click here for ABCD2, HEART and other calculatorsREFRESH Note before signing :1}                              Medical Decision Making  ***  {Document critical care time when appropriate:1} {Document review of labs and clinical decision tools ie heart score, Chads2Vasc2 etc:1}  {Document your independent review of radiology images, and any outside records:1} {Document your discussion with family members, caretakers, and with consultants:1} {Document social determinants of health  affecting pt's care:1} {Document your decision making why or why not admission, treatments were needed:1} Final Clinical Impression(s) / ED Diagnoses Final diagnoses:  None    Rx / DC Orders ED Discharge Orders     None

## 2023-05-01 ENCOUNTER — Other Ambulatory Visit (HOSPITAL_COMMUNITY): Payer: Self-pay

## 2023-05-03 ENCOUNTER — Other Ambulatory Visit: Payer: Self-pay

## 2023-05-04 ENCOUNTER — Other Ambulatory Visit: Payer: Self-pay

## 2023-05-05 ENCOUNTER — Other Ambulatory Visit: Payer: Self-pay

## 2023-05-26 ENCOUNTER — Other Ambulatory Visit: Payer: Self-pay | Admitting: Internal Medicine

## 2023-05-26 DIAGNOSIS — E1169 Type 2 diabetes mellitus with other specified complication: Secondary | ICD-10-CM

## 2023-06-02 ENCOUNTER — Encounter: Payer: Self-pay | Admitting: Internal Medicine

## 2023-06-02 NOTE — Telephone Encounter (Signed)
Appointment was canceled for this coming up Monday and rescheduled

## 2023-06-05 ENCOUNTER — Ambulatory Visit: Payer: Medicaid Other | Admitting: Internal Medicine

## 2023-07-05 ENCOUNTER — Other Ambulatory Visit: Payer: Self-pay

## 2023-07-17 ENCOUNTER — Ambulatory Visit: Payer: Medicaid Other | Admitting: Internal Medicine

## 2023-08-10 ENCOUNTER — Other Ambulatory Visit: Payer: Self-pay

## 2023-08-10 ENCOUNTER — Other Ambulatory Visit: Payer: Self-pay | Admitting: Internal Medicine

## 2023-08-10 DIAGNOSIS — E1169 Type 2 diabetes mellitus with other specified complication: Secondary | ICD-10-CM

## 2023-08-12 MED ORDER — FREESTYLE LIBRE 2 SENSOR MISC
11 refills | Status: AC
Start: 2023-08-12 — End: ?
  Filled 2023-08-12: qty 2, 30d supply, fill #0
  Filled 2023-08-14 (×3): qty 2, 28d supply, fill #0
  Filled 2023-09-01 – 2023-09-04 (×2): qty 2, 28d supply, fill #1

## 2023-08-14 ENCOUNTER — Ambulatory Visit: Payer: Medicaid Other | Admitting: Internal Medicine

## 2023-08-14 ENCOUNTER — Other Ambulatory Visit: Payer: Self-pay

## 2023-08-14 ENCOUNTER — Telehealth: Payer: Self-pay

## 2023-08-14 NOTE — Telephone Encounter (Signed)
Pharmacy Patient Advocate Encounter  Received notification from [facility] that Prior Authorization for FREESTYLE LIBRE 2 SENSOR has been  06 /23/2025   PA #/Case ID/Reference #: [id number]

## 2023-08-15 ENCOUNTER — Other Ambulatory Visit: Payer: Self-pay

## 2023-09-01 ENCOUNTER — Other Ambulatory Visit: Payer: Self-pay | Admitting: Internal Medicine

## 2023-09-01 ENCOUNTER — Other Ambulatory Visit: Payer: Self-pay

## 2023-09-03 MED ORDER — TRULICITY 3 MG/0.5ML ~~LOC~~ SOAJ
3.0000 mg | SUBCUTANEOUS | 0 refills | Status: DC
  Filled 2023-09-03: qty 2, 28d supply, fill #0

## 2023-09-04 ENCOUNTER — Other Ambulatory Visit: Payer: Self-pay

## 2023-09-08 ENCOUNTER — Other Ambulatory Visit: Payer: Self-pay

## 2023-09-15 ENCOUNTER — Ambulatory Visit: Payer: Medicaid Other | Admitting: Internal Medicine

## 2023-09-25 ENCOUNTER — Other Ambulatory Visit: Payer: Self-pay | Admitting: Internal Medicine

## 2023-09-25 ENCOUNTER — Other Ambulatory Visit: Payer: Self-pay

## 2023-09-25 ENCOUNTER — Encounter: Payer: Self-pay | Admitting: Internal Medicine

## 2023-09-25 DIAGNOSIS — E1169 Type 2 diabetes mellitus with other specified complication: Secondary | ICD-10-CM

## 2023-09-25 MED ORDER — DEXCOM G7 RECEIVER DEVI
0 refills | Status: AC
  Filled 2023-09-25: qty 1, 365d supply, fill #0

## 2023-09-25 MED ORDER — DEXCOM G7 SENSOR MISC
6 refills | Status: DC
Start: 2023-09-25 — End: 2024-05-02
  Filled 2023-09-25: qty 3, 30d supply, fill #0
  Filled 2023-10-30: qty 3, 30d supply, fill #1
  Filled 2023-11-23: qty 3, 30d supply, fill #2
  Filled 2023-12-28: qty 3, 30d supply, fill #3
  Filled 2024-02-03 – 2024-02-05 (×2): qty 3, 30d supply, fill #4
  Filled 2024-02-24 – 2024-02-28 (×2): qty 3, 30d supply, fill #5
  Filled 2024-04-04: qty 3, 30d supply, fill #6

## 2023-09-26 ENCOUNTER — Other Ambulatory Visit: Payer: Self-pay

## 2023-10-02 ENCOUNTER — Other Ambulatory Visit: Payer: Self-pay

## 2023-10-03 ENCOUNTER — Other Ambulatory Visit: Payer: Self-pay

## 2023-10-06 ENCOUNTER — Other Ambulatory Visit: Payer: Self-pay

## 2023-10-27 ENCOUNTER — Encounter: Payer: Self-pay | Admitting: Internal Medicine

## 2023-10-27 ENCOUNTER — Encounter: Payer: Self-pay | Admitting: Pharmacist

## 2023-10-27 ENCOUNTER — Ambulatory Visit (HOSPITAL_BASED_OUTPATIENT_CLINIC_OR_DEPARTMENT_OTHER): Admitting: Pharmacist

## 2023-10-27 ENCOUNTER — Other Ambulatory Visit: Payer: Self-pay

## 2023-10-27 ENCOUNTER — Ambulatory Visit: Payer: Medicaid Other | Attending: Internal Medicine | Admitting: Internal Medicine

## 2023-10-27 DIAGNOSIS — I10 Essential (primary) hypertension: Secondary | ICD-10-CM

## 2023-10-27 DIAGNOSIS — Z6841 Body Mass Index (BMI) 40.0 and over, adult: Secondary | ICD-10-CM

## 2023-10-27 DIAGNOSIS — Z23 Encounter for immunization: Secondary | ICD-10-CM

## 2023-10-27 DIAGNOSIS — E785 Hyperlipidemia, unspecified: Secondary | ICD-10-CM

## 2023-10-27 DIAGNOSIS — E1169 Type 2 diabetes mellitus with other specified complication: Secondary | ICD-10-CM

## 2023-10-27 DIAGNOSIS — Z794 Long term (current) use of insulin: Secondary | ICD-10-CM | POA: Diagnosis not present

## 2023-10-27 DIAGNOSIS — Z7985 Long-term (current) use of injectable non-insulin antidiabetic drugs: Secondary | ICD-10-CM | POA: Diagnosis not present

## 2023-10-27 DIAGNOSIS — E1159 Type 2 diabetes mellitus with other circulatory complications: Secondary | ICD-10-CM

## 2023-10-27 DIAGNOSIS — E669 Obesity, unspecified: Secondary | ICD-10-CM

## 2023-10-27 DIAGNOSIS — E119 Type 2 diabetes mellitus without complications: Secondary | ICD-10-CM

## 2023-10-27 DIAGNOSIS — R079 Chest pain, unspecified: Secondary | ICD-10-CM

## 2023-10-27 DIAGNOSIS — Z7984 Long term (current) use of oral hypoglycemic drugs: Secondary | ICD-10-CM

## 2023-10-27 DIAGNOSIS — Z7189 Other specified counseling: Secondary | ICD-10-CM

## 2023-10-27 DIAGNOSIS — E66813 Obesity, class 3: Secondary | ICD-10-CM

## 2023-10-27 DIAGNOSIS — I5030 Unspecified diastolic (congestive) heart failure: Secondary | ICD-10-CM

## 2023-10-27 LAB — POCT GLYCOSYLATED HEMOGLOBIN (HGB A1C): HbA1c, POC (controlled diabetic range): 8.3 % — AB (ref 0.0–7.0)

## 2023-10-27 LAB — GLUCOSE, POCT (MANUAL RESULT ENTRY): POC Glucose: 115 mg/dL — AB (ref 70–99)

## 2023-10-27 MED ORDER — DAPAGLIFLOZIN PROPANEDIOL 10 MG PO TABS
10.0000 mg | ORAL_TABLET | Freq: Every day | ORAL | 1 refills | Status: DC
Start: 2023-10-27 — End: 2024-05-11
  Filled 2023-10-27 – 2023-11-09 (×2): qty 90, 90d supply, fill #0
  Filled 2024-02-03 – 2024-02-06 (×2): qty 90, 90d supply, fill #1

## 2023-10-27 MED ORDER — HYDROCHLOROTHIAZIDE 25 MG PO TABS
25.0000 mg | ORAL_TABLET | Freq: Every day | ORAL | 1 refills | Status: DC
Start: 2023-10-27 — End: 2024-03-21
  Filled 2023-10-27 – 2023-11-09 (×3): qty 90, 90d supply, fill #0
  Filled 2024-02-08: qty 90, 90d supply, fill #1

## 2023-10-27 MED ORDER — SILDENAFIL CITRATE 100 MG PO TABS
100.0000 mg | ORAL_TABLET | ORAL | 2 refills | Status: DC
  Filled 2023-10-27: qty 10, 30d supply, fill #0
  Filled 2023-11-23: qty 10, 30d supply, fill #1
  Filled 2024-02-03: qty 10, 30d supply, fill #2

## 2023-10-27 MED ORDER — HYDRALAZINE HCL 100 MG PO TABS
100.0000 mg | ORAL_TABLET | Freq: Three times a day (TID) | ORAL | 1 refills | Status: AC
  Filled 2023-10-27 – 2023-11-09 (×2): qty 270, 90d supply, fill #0
  Filled 2024-05-31: qty 270, 90d supply, fill #1

## 2023-10-27 MED ORDER — GLIPIZIDE 10 MG PO TABS
10.0000 mg | ORAL_TABLET | Freq: Two times a day (BID) | ORAL | 4 refills | Status: DC
  Filled 2023-10-27: qty 60, 30d supply, fill #0
  Filled 2023-11-23: qty 60, 30d supply, fill #1
  Filled 2024-02-03 – 2024-02-05 (×2): qty 60, 30d supply, fill #2
  Filled 2024-03-16: qty 60, 30d supply, fill #3

## 2023-10-27 MED ORDER — SEMAGLUTIDE(0.25 OR 0.5MG/DOS) 2 MG/3ML ~~LOC~~ SOPN
0.5000 mg | PEN_INJECTOR | SUBCUTANEOUS | 0 refills | Status: DC
  Filled 2023-10-27: qty 3, 28d supply, fill #0

## 2023-10-27 MED ORDER — FUROSEMIDE 20 MG PO TABS
20.0000 mg | ORAL_TABLET | Freq: Every day | ORAL | 1 refills | Status: DC
Start: 2023-10-27 — End: 2024-03-21
  Filled 2023-10-27 – 2023-11-09 (×2): qty 90, 90d supply, fill #0
  Filled 2024-02-08: qty 90, 90d supply, fill #1

## 2023-10-27 MED ORDER — CARVEDILOL 25 MG PO TABS
25.0000 mg | ORAL_TABLET | Freq: Two times a day (BID) | ORAL | 1 refills | Status: DC
  Filled 2023-10-27 – 2023-11-09 (×2): qty 180, 90d supply, fill #0
  Filled 2024-05-02: qty 180, 90d supply, fill #1

## 2023-10-27 MED ORDER — LOSARTAN POTASSIUM 100 MG PO TABS
100.0000 mg | ORAL_TABLET | Freq: Every day | ORAL | 1 refills | Status: DC
  Filled 2023-10-27 – 2023-11-09 (×2): qty 90, 90d supply, fill #0
  Filled 2024-02-03 – 2024-02-05 (×2): qty 90, 90d supply, fill #1

## 2023-10-27 MED ORDER — ATORVASTATIN CALCIUM 40 MG PO TABS
40.0000 mg | ORAL_TABLET | Freq: Every day | ORAL | 1 refills | Status: DC
  Filled 2023-10-27 – 2023-11-09 (×3): qty 90, 90d supply, fill #0
  Filled 2024-02-03 – 2024-02-05 (×2): qty 90, 90d supply, fill #1

## 2023-10-27 MED ORDER — METFORMIN HCL 1000 MG PO TABS
1000.0000 mg | ORAL_TABLET | Freq: Two times a day (BID) | ORAL | 1 refills | Status: DC
  Filled 2023-10-27 – 2023-11-09 (×2): qty 180, 90d supply, fill #0
  Filled 2024-05-11: qty 180, 90d supply, fill #1

## 2023-10-27 MED ORDER — OMEPRAZOLE 20 MG PO CPDR
20.0000 mg | DELAYED_RELEASE_CAPSULE | Freq: Every day | ORAL | 1 refills | Status: DC
  Filled 2023-10-27: qty 90, 90d supply, fill #0
  Filled 2024-02-03 – 2024-02-05 (×2): qty 90, 90d supply, fill #1

## 2023-10-27 NOTE — Progress Notes (Signed)
 Patient ID: Paul Lamb, male    DOB: Nov 10, 1985  MRN: [id number]  CC: Diabetes (DM & HTN f/u. Med refill. Paul Lamb trulicty dosage Paul Lamb X1 week/Yes to flu vax)   Subjective: Paul Lamb is a 38 y.o. male who presents for chronic ds management. His concerns today include:  Patient with history of HTN, diastolic CHF with EF 60-65% with G1DD, HTN, OSA on CPAP, HL, morbid obesity, diabetes with neuropathy, depression.   DM: Results for orders placed or performed in visit on 10/27/23  POCT glycosylated hemoglobin (Hb A1C)   Collection Time: 10/27/23  9:56 AM  Result Value Ref Range   Hemoglobin A1C     HbA1c POC (<> result, manual entry)     HbA1c, POC (prediabetic range)     HbA1c, POC (controlled diabetic range) 8.3 (A) 0.0 - 7.0 %  POCT glucose (manual entry)   Collection Time: 10/27/23  9:56 AM  Result Value Ref Range   POC Glucose 115 (A) 70 - 99 mg/dl  Restarted Lantus insulin since last visit due to elev BS. Currently on Lantus 18 units daily, Trulicity 3 mg Q wk, Farxiga 10 mg and Glucotrol 10 mg BID -has Dexcom.  Over the past 30 days, time in range has been 79% of the times, high 19% of the times and very high 1% of the times.  Low was less than 1%. -Request higher dose Trulicity or change to Ozempic if insurance covers. Did not cover in past  Tries to walk at truck stops except if cold -had DM eye exam 10/05/2022 [facility].  Will send pic on Mychart  Diastolic CHF/HTN: Confirms taking:  Norvasc 10 mg, Coreg 25 mg BID, Cozaar 100 mg, Hydralazine 100 mg TID, HCTZ 25 and lasix 20 mg.  Took meds aready for the morning.  Limits salt. No SOB/LE edema.    C/o heart burn over the past 1-2 wks - burning in center of chest that can occur even when he has not eaten anything.  Episodes last 5-10 mins. No radiation.  No fhx of HD.  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   Left  ventricular hypertrophy 12/13/2022   Thoracic ascending aortic aneurysm (HCC) 12/13/2022   Hyperlipidemia 12/13/2022   Type 2 diabetes mellitus with obesity (HCC) 12/29/2017   Diastolic congestive heart failure (HCC) 11/28/2017   Proteinuria    Erectile dysfunction 05/19/2017   Depression 05/19/2017   Essential hypertension 01/12/2016   Sleep apnea 01/12/2016   Abnormal EKG    Hand pain, left 11/01/2015   Morbid obesity (HCC) 11/01/2015     Current Outpatient Medications on File Prior to Visit  Medication Sig Dispense Refill   amLODipine (NORVASC) 10 MG tablet Take 1 tablet (10 mg total) by mouth daily. 90 tablet 1   aspirin 81 MG EC tablet Take 1 tablet (81 mg total) by mouth daily. 100 tablet 1   Continuous Glucose Receiver (DEXCOM G7 RECEIVER) DEVI Use as directed to check blood sugars.   E11.69, E66.01 1 each 0   Continuous Glucose Sensor (DEXCOM G7 SENSOR) MISC Change every 10 days.  E11.69, E66.01 3 each 6   Continuous Glucose Sensor (FREESTYLE LIBRE 2 SENSOR) MISC CHANGE SENSOR EVERY 2 WEEKS 2 each 11   insulin glargine (LANTUS) 100 unit/mL SOPN Inject 10 Units into the skin at bedtime. 15 mL 11   Insulin Pen Needle (TECHLITE PEN  NEEDLES) 32G X 4 MM MISC Use to inject Lantus once daily. 100 each 3   No current facility-administered medications on file prior to visit.    Allergies  Allergen Reactions   Imdur [Isosorbide Dinitrate] Other (See Comments)    Headaches    Penicillins Other (See Comments)    From childhood; reaction not known: Has patient had a PCN reaction causing immediate rash, facial/tongue/throat swelling, SOB or lightheadedness with hypotension: Unk Has patient had a PCN reaction causing severe rash involving mucus membranes or skin necrosis: Unk Has patient had a PCN reaction that required hospitalization: Unk Has patient had a PCN reaction occurring within the last 10 years: No If all of the above answers are "NO", then may proceed with Cephalosporin  use.     Social History   Socioeconomic History   Marital status: Single    Spouse name: Not on file   Number of children: Not on file   Years of education: Not on file   Highest education level: Not on file  Occupational History   Not on file  Tobacco Use   Smoking status: Never   Smokeless tobacco: Never  Vaping Use   Vaping status: Never Used  Substance and Sexual Activity   Alcohol use: Yes    Alcohol/week: 0.0 standard drinks of alcohol   Drug use: No   Sexual activity: Yes  Other Topics Concern   Not on file  Social History Narrative   Not on file   Social Drivers of Health   Financial Resource Strain: Low Risk  (10/27/2023)   Overall Financial Resource Strain (CARDIA)    Difficulty of Paying Living Expenses: Not very hard  Food Insecurity: No Food Insecurity (10/27/2023)   Hunger Vital Sign    Worried About Running Out of Food in the Last Year: Never true    Ran Out of Food in the Last Year: Never true  Transportation Needs: No Transportation Needs (10/27/2023)   PRAPARE - Administrator, Civil Service (Medical): No    Lack of Transportation (Non-Medical): No  Physical Activity: Sufficiently Active (10/27/2023)   Exercise Vital Sign    Days of Exercise per Week: 5 days    Minutes of Exercise per Session: 30 min  Stress: Stress Concern Present (10/27/2023)   [employer] of Occupational Health - Occupational Stress Questionnaire    Feeling of Stress : To some extent  Social Connections: Socially Isolated (10/27/2023)   Social Connection and Isolation Panel [NHANES]    Frequency of Communication with Friends and Family: Three times a week    Frequency of Social Gatherings with Friends and Family: Once a week    Attends Religious Services: Never    Database administrator or Organizations: No    Attends Banker Meetings: Never    Marital Status: Separated  Intimate Partner Violence: Not At Risk (10/27/2023)   Humiliation, Afraid, Rape, and  Kick questionnaire    Fear of Current or Ex-Partner: No    Emotionally Abused: No    Physically Abused: No    Sexually Abused: No    Family History  Problem Relation Age of Onset   Hypertension Other    Diabetes Mellitus II Other     Past Surgical History:  Procedure Laterality Date   NO PAST SURGERIES      ROS: Review of Systems Negative except as stated above  PHYSICAL EXAM: BP 100/65 (BP Location: Left Arm, Patient Position: Sitting, Cuff Size: Large)  Pulse 75   Temp 97.9 F (36.6 C) (Oral)   Ht 6' (1.829 m)   Wt (!) 390 lb (176.9 kg)   SpO2 96%   BMI 52.89 kg/m   Wt Readings from Last 3 Encounters:  10/27/23 (!) 390 lb (176.9 kg)  04/29/23 (!) 402 lb (182.3 kg)  01/30/23 (!) 404 lb (183.3 kg)    Physical Exam   General appearance - alert, well appearing, and in no distress Mental status - normal mood, behavior, speech, dress, motor activity, and thought processes Neck - supple, no significant adenopathy Chest - clear to auscultation, no wheezes, rales or rhonchi, symmetric air entry Heart - normal rate, regular rhythm, normal S1, S2, no murmurs, rubs, clicks or gallops Extremities - peripheral pulses normal, no pedal edema, no clubbing or cyanosis     Latest Ref Rng & Units 04/29/2023   12:16 PM 04/29/2023   12:10 PM 01/28/2023    1:00 PM  CMP  Glucose 70 - 99 mg/dL  841  324   BUN 6 - 20 mg/dL  17  14   Creatinine 4.01 - 1.24 mg/dL  0.27  2.53   Sodium 664 - 145 mmol/L 137  136  140   Potassium 3.5 - 5.1 mmol/L 3.8  3.8  3.6   Chloride 98 - 111 mmol/L  102  102   CO2 22 - 32 mmol/L  25  22   Calcium 8.9 - 10.3 mg/dL  9.0  9.9   Total Protein 6.5 - 8.1 g/dL   7.3   Total Bilirubin 0.3 - 1.2 mg/dL   1.7   Alkaline Phos 38 - 126 U/L   80   AST 15 - 41 U/L   23   ALT 0 - 44 U/L   31    Lipid Panel     Component Value Date/Time   CHOL 120 09/23/2022 1030   TRIG 138 09/23/2022 1030   HDL 31 (L) 09/23/2022 1030   CHOLHDL 3.9 09/23/2022 1030    CHOLHDL 7.6 09/10/2017 0451   VLDL 29 09/10/2017 0451   LDLCALC 65 09/23/2022 1030    CBC    Component Value Date/Time   WBC 7.3 04/29/2023 1210   RBC 6.26 (H) 04/29/2023 1210   HGB 15.0 04/29/2023 1216   HGB 15.7 09/23/2022 1030   HCT 44.0 04/29/2023 1216   HCT 45.4 09/23/2022 1030   PLT 198 04/29/2023 1210   PLT 234 09/23/2022 1030   MCV 70.3 (L) 04/29/2023 1210   MCV 72 (L) 09/23/2022 1030   MCH 24.3 (L) 04/29/2023 1210   MCHC 34.5 04/29/2023 1210   RDW 14.3 04/29/2023 1210   RDW 17.1 (H) 09/23/2022 1030   LYMPHSABS 2.0 04/29/2023 1210   MONOABS 1.6 (H) 04/29/2023 1210   EOSABS 0.0 04/29/2023 1210   BASOSABS 0.1 04/29/2023 1210    ASSESSMENT AND PLAN: 1. Type 2 diabetes mellitus with morbid obesity (HCC) (Primary) A1c not at goal based on A1c. Continue Farxiga, metformin and glipizide.  Looks like his insurance may now [employer].  We will have him stop Trulicity and changed to Ozempic instead hopefully to achieve better diabetes control and weight loss.  Will start with Ozempic 0.5 mg once a week.  After the third week, if he is tolerating the medicine, he can let me know via MyChart so that we can increase the dose to 1 mg.  Will have him follow-up with the clinical pharmacist in several weeks. Advised to let me know  if he develops any vomiting, abdominal pain, severe diarrhea/constipation on Ozempic. Met with clinical pharmacist today to be taught administration of Ozempic. - POCT glycosylated hemoglobin (Hb A1C) - POCT glucose (manual entry) - dapagliflozin propanediol (FARXIGA) 10 MG TABS tablet; Take 1 tablet (10 mg total) by mouth daily.  Dispense: 90 tablet; Refill: 1 - glipiZIDE (GLUCOTROL) 10 MG tablet; Take 1 tablet (10 mg total) by mouth 2 (two) times daily.  Dispense: 60 tablet; Refill: 4 - metFORMIN (GLUCOPHAGE) 1000 MG tablet; Take 1 tablet (1,000 mg total) by mouth 2 (two) times daily with a meal.  Dispense: 180 tablet; Refill: 1 - Microalbumin /  creatinine urine ratio  2. Hyperlipidemia associated with type 2 diabetes mellitus (HCC) Continue atorvastatin - atorvastatin (LIPITOR) 40 MG tablet; Take 1 tablet (40 mg total) by mouth daily at 6 PM.  Dispense: 90 tablet; Refill: 1  3. Hypertension associated with diabetes (HCC) At goal.  Continue medicines listed above. - carvedilol (COREG) 25 MG tablet; Take 1 tablet (25 mg total) by mouth 2 (two) times daily with a meal.  Dispense: 180 tablet; Refill: 1 - hydrALAZINE (APRESOLINE) 100 MG tablet; Take 1 tablet (100 mg total) by mouth 3 (three) times daily.  Dispense: 270 tablet; Refill: 1 - hydrochlorothiazide (HYDRODIURIL) 25 MG tablet; Take 1 tablet (25 mg total) by mouth daily.  Dispense: 90 tablet; Refill: 1 - losartan (COZAAR) 100 MG tablet; Take 1 tablet (100 mg total) by mouth daily.  Dispense: 90 tablet; Refill: 1  4. Diastolic congestive heart failure, unspecified HF chronicity (HCC) Stable and compensated.  Continue Farxiga, furosemide - furosemide (LASIX) 20 MG tablet; Take 1 tablet (20 mg total) by mouth daily.  Dispense: 90 tablet; Refill: 1  5. Chest pain in adult Differential diagnosis include acid reflux versus angina.  We will start him on omeprazole.GERD precautions discussed.  Advised to avoid certain foods like spicy foods, tomato-based foods, juices and excessive caffeine.  Advised to eat his last meal at least 2 to 3 hours before laying down at nights and to sleep with his head slightly elevated.   -We will also get him back to his cardiologist Dr. Antoine Poche to evaluate from a cardiac standpoint as patient does have risk factors for heart disease. - Ambulatory referral to Cardiology  6. Need for influenza vaccination - Flu vaccine trivalent PF, 6mos and older(Flulaval,Afluria,Fluarix,Fluzone)    Patient was given the opportunity to ask questions.  Patient verbalized understanding of the plan and was able to repeat key elements of the plan.   This documentation was  completed using Paediatric nurse.  Any transcriptional errors are unintentional.  Orders Placed This Encounter  Procedures   Flu vaccine trivalent PF, 6mos and older(Flulaval,Afluria,Fluarix,Fluzone)   Microalbumin / creatinine urine ratio   Ambulatory referral to Cardiology   POCT glycosylated hemoglobin (Hb A1C)   POCT glucose (manual entry)     Requested Prescriptions   Signed Prescriptions Disp Refills   atorvastatin (LIPITOR) 40 MG tablet 90 tablet 1    Sig: Take 1 tablet (40 mg total) by mouth daily at 6 PM.   carvedilol (COREG) 25 MG tablet 180 tablet 1    Sig: Take 1 tablet (25 mg total) by mouth 2 (two) times daily with a meal.   dapagliflozin propanediol (FARXIGA) 10 MG TABS tablet 90 tablet 1    Sig: Take 1 tablet (10 mg total) by mouth daily.   furosemide (LASIX) 20 MG tablet 90 tablet 1    Sig: Take  1 tablet (20 mg total) by mouth daily.   glipiZIDE (GLUCOTROL) 10 MG tablet 60 tablet 4    Sig: Take 1 tablet (10 mg total) by mouth 2 (two) times daily.   hydrALAZINE (APRESOLINE) 100 MG tablet 270 tablet 1    Sig: Take 1 tablet (100 mg total) by mouth 3 (three) times daily.   hydrochlorothiazide (HYDRODIURIL) 25 MG tablet 90 tablet 1    Sig: Take 1 tablet (25 mg total) by mouth daily.   losartan (COZAAR) 100 MG tablet 90 tablet 1    Sig: Take 1 tablet (100 mg total) by mouth daily.   metFORMIN (GLUCOPHAGE) 1000 MG tablet 180 tablet 1    Sig: Take 1 tablet (1,000 mg total) by mouth 2 (two) times daily with a meal.   sildenafil (VIAGRA) 100 MG tablet 10 tablet 2    Sig: Take 1 tablet (100 mg total) by mouth 30 minutes to 1 hour before intercourse as needed.   Semaglutide,0.25 or 0.5MG /DOS, 2 MG/3ML SOPN 3 mL 0    Sig: Inject 0.5 mg into the skin once a week.   omeprazole (PRILOSEC) 20 MG capsule 90 capsule 1    Sig: Take 1 capsule (20 mg total) by mouth daily.    Return in about 4 months (around 02/26/2024) for 4 weeks with clinical pharmacist for  DM.  Paul Blue, MD, FACP

## 2023-10-27 NOTE — Progress Notes (Signed)
 Patient was educated on the use of the Ozempic pen. Reviewed necessary supplies and operation of the pen. Also reviewed goal blood glucose levels. Patient was able to demonstrate use. All questions and concerns were addressed.  Follow-up: 4-6 weeks Time spent counseling: 15 minutes   Butch Penny, PharmD, [place], CPP Clinical Pharmacist [facility] [phone]

## 2023-10-27 NOTE — Patient Instructions (Addendum)
 Stop Trulicity. Start Ozempic 0.5 mg once a week.  After being on this dose for 3 weeks, if you are tolerating it, please send me a MyChart message to let me know so that we can increase the dose to the 1 mg. Please let me know if you experience any vomiting, abdominal pain, severe diarrhea or constipation while on Ozempic.  I will have you follow-up with our clinical pharmacist in 1 month to see how you are doing with your blood sugars and to further titrate the medicine.  GERD precautions discussed.  Advised to avoid certain foods like spicy foods, tomato-based foods, juices and excessive caffeine.  Advised to eat his last meal at least 2 to 3 hours before laying down at nights and to sleep with his head slightly elevated.

## 2023-10-29 ENCOUNTER — Other Ambulatory Visit: Payer: Self-pay | Admitting: Internal Medicine

## 2023-10-29 MED ORDER — TRULICITY 3 MG/0.5ML ~~LOC~~ SOAJ
3.0000 mg | SUBCUTANEOUS | 0 refills | Status: DC
  Filled 2023-10-29 – 2023-10-30 (×3): qty 2, 28d supply, fill #0

## 2023-10-30 ENCOUNTER — Encounter: Payer: Self-pay | Admitting: Internal Medicine

## 2023-10-30 ENCOUNTER — Other Ambulatory Visit: Payer: Self-pay

## 2023-10-30 ENCOUNTER — Other Ambulatory Visit (HOSPITAL_COMMUNITY): Payer: Self-pay

## 2023-10-30 ENCOUNTER — Telehealth: Payer: Self-pay

## 2023-10-30 LAB — MICROALBUMIN / CREATININE URINE RATIO
Creatinine, Urine: 85.9 mg/dL
Microalb/Creat Ratio: <3 mg/g{creat} (ref 0–29)
Microalbumin, Urine: <3 ug/mL

## 2023-10-30 NOTE — Telephone Encounter (Signed)
 Pharmacy Patient Advocate Encounter   Received notification from CoverMyMeds that prior authorization for [facility] is required/requested.   Insurance verification completed.   The patient is insured through [employer] .   Per test claim: PA required; PA submitted to above mentioned insurance via CoverMyMeds Key/confirmation #/EOC [id number] Status is pending

## 2023-10-31 ENCOUNTER — Telehealth: Payer: Self-pay

## 2023-10-31 ENCOUNTER — Emergency Department (HOSPITAL_COMMUNITY)
Admission: EM | Admit: 2023-10-31 | Discharge: 2023-11-01 | Disposition: A | Discharge status: Home / Self Care | Attending: Emergency Medicine | Admitting: Emergency Medicine

## 2023-10-31 ENCOUNTER — Emergency Department (HOSPITAL_COMMUNITY)

## 2023-10-31 ENCOUNTER — Other Ambulatory Visit: Payer: Self-pay

## 2023-10-31 ENCOUNTER — Encounter (HOSPITAL_COMMUNITY): Payer: Self-pay | Admitting: Emergency Medicine

## 2023-10-31 DIAGNOSIS — R109 Unspecified abdominal pain: Secondary | ICD-10-CM | POA: Diagnosis present

## 2023-10-31 DIAGNOSIS — I110 Hypertensive heart disease with heart failure: Secondary | ICD-10-CM | POA: Insufficient documentation

## 2023-10-31 DIAGNOSIS — R197 Diarrhea, unspecified: Secondary | ICD-10-CM | POA: Diagnosis not present

## 2023-10-31 DIAGNOSIS — Z7984 Long term (current) use of oral hypoglycemic drugs: Secondary | ICD-10-CM | POA: Insufficient documentation

## 2023-10-31 DIAGNOSIS — Z79899 Other long term (current) drug therapy: Secondary | ICD-10-CM | POA: Insufficient documentation

## 2023-10-31 DIAGNOSIS — Z794 Long term (current) use of insulin: Secondary | ICD-10-CM | POA: Diagnosis not present

## 2023-10-31 DIAGNOSIS — Z7982 Long term (current) use of aspirin: Secondary | ICD-10-CM | POA: Insufficient documentation

## 2023-10-31 DIAGNOSIS — E119 Type 2 diabetes mellitus without complications: Secondary | ICD-10-CM | POA: Diagnosis not present

## 2023-10-31 DIAGNOSIS — I509 Heart failure, unspecified: Secondary | ICD-10-CM | POA: Diagnosis not present

## 2023-10-31 LAB — CBC WITH DIFFERENTIAL/PLATELET
Abs Immature Granulocytes: 0.03 10*3/uL (ref 0.00–0.07)
Basophils Absolute: 0 10*3/uL (ref 0.0–0.1)
Basophils Relative: 0 %
Eosinophils Absolute: 0 10*3/uL (ref 0.0–0.5)
Eosinophils Relative: 0 %
HCT: 45.3 % (ref 39.0–52.0)
Hemoglobin: 16 g/dL (ref 13.0–17.0)
Immature Granulocytes: 0 %
Lymphocytes Relative: 13 %
Lymphs Abs: 1.1 10*3/uL (ref 0.7–4.0)
MCH: 24.8 pg — ABNORMAL LOW (ref 26.0–34.0)
MCHC: 35.3 g/dL (ref 30.0–36.0)
MCV: 70.3 fL — ABNORMAL LOW (ref 80.0–100.0)
Monocytes Absolute: 0.7 10*3/uL (ref 0.1–1.0)
Monocytes Relative: 9 %
Neutro Abs: 6.6 10*3/uL (ref 1.7–7.7)
Neutrophils Relative %: 78 %
Platelets: 225 10*3/uL (ref 150–400)
RBC: 6.44 MIL/uL — ABNORMAL HIGH (ref 4.22–5.81)
RDW: 15.4 % (ref 11.5–15.5)
WBC: 8.5 10*3/uL (ref 4.0–10.5)
nRBC: 0 % (ref 0.0–0.2)

## 2023-10-31 LAB — COMPREHENSIVE METABOLIC PANEL
ALT: 21 U/L (ref 0–44)
AST: 14 U/L — ABNORMAL LOW (ref 15–41)
Albumin: 3.8 g/dL (ref 3.5–5.0)
Alkaline Phosphatase: 65 U/L (ref 38–126)
Anion gap: 14 (ref 5–15)
BUN: 18 mg/dL (ref 6–20)
CO2: 18 mmol/L — ABNORMAL LOW (ref 22–32)
Calcium: 8.9 mg/dL (ref 8.9–10.3)
Chloride: 103 mmol/L (ref 98–111)
Creatinine, Ser: 1.47 mg/dL — ABNORMAL HIGH (ref 0.61–1.24)
GFR, Estimated: >60 mL/min (ref >60)
Glucose, Bld: 112 mg/dL — ABNORMAL HIGH (ref 70–99)
Potassium: 3.5 mmol/L (ref 3.5–5.1)
Sodium: 135 mmol/L (ref 135–145)
Total Bilirubin: 2.7 mg/dL — ABNORMAL HIGH (ref 0.0–1.2)
Total Protein: 7 g/dL (ref 6.5–8.1)

## 2023-10-31 LAB — URINALYSIS, W/ REFLEX TO CULTURE (INFECTION SUSPECTED)
Bacteria, UA: NONE SEEN
Bilirubin Urine: NEGATIVE
Glucose, UA: >=500 mg/dL — AB
Hgb urine dipstick: NEGATIVE
Ketones, ur: NEGATIVE mg/dL
Leukocytes,Ua: NEGATIVE
Nitrite: NEGATIVE
Protein, ur: NEGATIVE mg/dL
Specific Gravity, Urine: 1.024 (ref 1.005–1.030)
pH: 5 (ref 5.0–8.0)

## 2023-10-31 LAB — I-STAT CHEM 8, ED
BUN: 18 mg/dL (ref 6–20)
Calcium, Ion: 1.03 mmol/L — ABNORMAL LOW (ref 1.15–1.40)
Chloride: 102 mmol/L (ref 98–111)
Creatinine, Ser: 1.4 mg/dL — ABNORMAL HIGH (ref 0.61–1.24)
Glucose, Bld: 109 mg/dL — ABNORMAL HIGH (ref 70–99)
HCT: 44 % (ref 39.0–52.0)
Hemoglobin: 15 g/dL (ref 13.0–17.0)
Potassium: 3.4 mmol/L — ABNORMAL LOW (ref 3.5–5.1)
Sodium: 137 mmol/L (ref 135–145)
TCO2: 23 mmol/L (ref 22–32)

## 2023-10-31 LAB — LIPASE, BLOOD: Lipase: 26 U/L (ref 11–51)

## 2023-10-31 MED ORDER — IOHEXOL 350 MG/ML SOLN
75.0000 mL | Freq: Once | INTRAVENOUS | Status: AC | PRN
  Administered 2023-10-31: 75 mL via INTRAVENOUS

## 2023-10-31 NOTE — Telephone Encounter (Signed)
 Pharmacy Patient Advocate Encounter  Received notification from [facility] that Prior Authorization for [facility] has been APPROVED from 10/30/2023 to 10/29/2024   Patient picked up medication from pharmacy 10/30/2023

## 2023-10-31 NOTE — ED Triage Notes (Signed)
 Patient reports abdominal pain and diarrhea since yesterday. Denies nausea + vomiting. Family members with similar symptoms after eating bojangles yesterday - unsure if related. Patient also reports starting ozempic yesterday. Patient also reports lower back pain and dizziness.

## 2023-10-31 NOTE — ED Provider Triage Note (Signed)
 Emergency Medicine Provider Triage Evaluation Note  Paul Lamb , a 38 y.o. male  was evaluated in triage.  Pt complains of abdominal pain.  Review of Systems  Positive:  Negative:   Physical Exam  BP (!) 146/92   Pulse 97   Temp 99.9 F (37.7 C)   Resp 20   Ht 6' (1.829 m)   Wt (!) 176.9 kg   SpO2 96%   BMI 52.89 kg/m  Gen:   Awake, no distress   Resp:  Normal effort  MSK:   Moves extremities without difficulty  Other:    Medical Decision Making  Medically screening exam initiated at 8:54 PM.  Appropriate orders placed.  Paul Lamb was informed that the remainder of the evaluation will be completed by another provider, this initial triage assessment does not replace that evaluation, and the importance of remaining in the ED until their evaluation is complete.  Generalized abdominal pain and diarrhea since yesterday. Family members also with diarrhea - could be from the [employer]. But patient also with Ozempic injection yesterday which may also be adding to symptoms.    Dorthy Cooler, [place] 10/31/23 2055

## 2023-11-01 ENCOUNTER — Other Ambulatory Visit: Payer: Self-pay

## 2023-11-01 MED ORDER — LOPERAMIDE HCL 2 MG PO CAPS
2.0000 mg | ORAL_CAPSULE | Freq: Four times a day (QID) | ORAL | 0 refills | Status: DC | PRN
  Filled 2023-11-01: qty 12, 3d supply, fill #0

## 2023-11-01 MED ORDER — DICYCLOMINE HCL 20 MG PO TABS
20.0000 mg | ORAL_TABLET | Freq: Two times a day (BID) | ORAL | 0 refills | Status: DC
  Filled 2023-11-01: qty 20, 10d supply, fill #0

## 2023-11-01 MED ORDER — ONDANSETRON 4 MG PO TBDP
4.0000 mg | ORAL_TABLET | Freq: Three times a day (TID) | ORAL | 0 refills | Status: DC | PRN
  Filled 2023-11-01: qty 10, 4d supply, fill #0

## 2023-11-01 NOTE — Discharge Instructions (Addendum)
 Labs and CT today were reassuring. Take the prescribed medication as directed.  Make sure to stay well hydrated.  Bland diet for now, advance as tolerated. Follow-up with your doctor if ongoing issues. Return to the ED for new or worsening symptoms.

## 2023-11-01 NOTE — Telephone Encounter (Signed)
 Patient was approved for Ozempic and has picked up the medicine from the pharmacy.  Trulicity removed from his medication list.

## 2023-11-01 NOTE — ED Provider Notes (Signed)
 [place] EMERGENCY DEPARTMENT AT [facility] Provider Note   CSN: [id number] Arrival date & time: 10/31/23  2019     History  Chief Complaint  Patient presents with   Abdominal Pain    Paul Lamb is a 38 y.o. male.  The history is provided by the patient and medical records.  Abdominal Pain Associated symptoms: diarrhea    38 year old male with history of depression, sleep apnea, CHF, hypertension, diabetes, obesity, presenting to the ED for abdominal pain and diarrhea.  Began Monday evening after eating takeout from [employer].  States he felt like the chicken may have been "overdone".  After eating whole family seem to come down with diarrhea.  He was recently started on Ozempic so was concerned may be related to that as well.  He has not had any fever or chills.  No vomiting.  Diarrhea does seem to have slowed.  He denies any bloody stools.  Home Medications Prior to Admission medications   Medication Sig Start Date End Date Taking? Authorizing Provider  amLODipine (NORVASC) 10 MG tablet Take 1 tablet (10 mg total) by mouth daily. 04/27/23   Marcine Matar, MD  aspirin 81 MG EC tablet Take 1 tablet (81 mg total) by mouth daily. 12/12/17   Marcine Matar, MD  atorvastatin (LIPITOR) 40 MG tablet Take 1 tablet (40 mg total) by mouth daily at 6 PM. 10/27/23   Marcine Matar, MD  carvedilol (COREG) 25 MG tablet Take 1 tablet (25 mg total) by mouth 2 (two) times daily with a meal. 10/27/23   Marcine Matar, MD  Continuous Glucose Receiver (DEXCOM G7 RECEIVER) DEVI Use as directed to check blood sugars.   E11.69, E66.01 09/25/23   Marcine Matar, MD  Continuous Glucose Sensor (DEXCOM G7 SENSOR) MISC Change every 10 days.  E11.69, E66.01 09/25/23   Marcine Matar, MD  Continuous Glucose Sensor (FREESTYLE LIBRE 2 SENSOR) MISC CHANGE SENSOR EVERY 2 WEEKS 08/12/23   Marcine Matar, MD  dapagliflozin propanediol (FARXIGA) 10 MG TABS tablet Take 1 tablet (10 mg  total) by mouth daily. 10/27/23   Marcine Matar, MD  Dulaglutide (TRULICITY) 3 MG/0.5ML SOAJ Inject 3 mg as directed once a week. Stop this medication when you get Ozempic 10/29/23   Marcine Matar, MD  furosemide (LASIX) 20 MG tablet Take 1 tablet (20 mg total) by mouth daily. 10/27/23   Marcine Matar, MD  glipiZIDE (GLUCOTROL) 10 MG tablet Take 1 tablet (10 mg total) by mouth 2 (two) times daily. 10/27/23   Marcine Matar, MD  hydrALAZINE (APRESOLINE) 100 MG tablet Take 1 tablet (100 mg total) by mouth 3 (three) times daily. 10/27/23   Marcine Matar, MD  hydrochlorothiazide (HYDRODIURIL) 25 MG tablet Take 1 tablet (25 mg total) by mouth daily. 10/27/23   Marcine Matar, MD  insulin glargine (LANTUS) 100 unit/mL SOPN Inject 10 Units into the skin at bedtime. 04/27/23   Marcine Matar, MD  Insulin Pen Needle (TECHLITE PEN NEEDLES) 32G X 4 MM MISC Use to inject Lantus once daily. 10/12/22   Marcine Matar, MD  losartan (COZAAR) 100 MG tablet Take 1 tablet (100 mg total) by mouth daily. 10/27/23   Marcine Matar, MD  metFORMIN (GLUCOPHAGE) 1000 MG tablet Take 1 tablet (1,000 mg total) by mouth 2 (two) times daily with a meal. 10/27/23   Marcine Matar, MD  omeprazole (PRILOSEC) 20 MG capsule Take 1 capsule (20  mg total) by mouth daily. 10/27/23   Marcine Matar, MD  Semaglutide,0.25 or 0.5MG /DOS, 2 MG/3ML SOPN Inject 0.5 mg into the skin once a week. 10/27/23   Marcine Matar, MD  sildenafil (VIAGRA) 100 MG tablet Take 1 tablet (100 mg total) by mouth 30 minutes to 1 hour before intercourse as needed. 10/27/23   Marcine Matar, MD      Allergies    Imdur [isosorbide dinitrate] and Penicillins    Review of Systems   Review of Systems  Gastrointestinal:  Positive for abdominal pain and diarrhea.  All other systems reviewed and are negative.   Physical Exam Updated Vital Signs BP 116/78   Pulse 85   Temp 98.8 F (37.1 C)   Resp 20   Ht 6' (1.829 m)   Wt (!)  176.9 kg   SpO2 95%   BMI 52.89 kg/m   Physical Exam Vitals and nursing note reviewed.  Constitutional:      Appearance: He is well-developed. He is obese.  HENT:     Head: Normocephalic and atraumatic.  Eyes:     Conjunctiva/sclera: Conjunctivae normal.     Pupils: Pupils are equal, round, and reactive to light.  Cardiovascular:     Rate and Rhythm: Normal rate and regular rhythm.     Heart sounds: Normal heart sounds.  Pulmonary:     Effort: Pulmonary effort is normal.     Breath sounds: Normal breath sounds.  Abdominal:     General: Bowel sounds are normal.     Palpations: Abdomen is soft.     Tenderness: There is no abdominal tenderness. There is no guarding or rebound.     Comments: Soft, nontender  Musculoskeletal:        General: Normal range of motion.     Cervical back: Normal range of motion.  Skin:    General: Skin is warm and dry.  Neurological:     Mental Status: He is alert and oriented to person, place, and time.     ED Results / Procedures / Treatments   Labs (all labs ordered are listed, but only abnormal results are displayed) Labs Reviewed  COMPREHENSIVE METABOLIC PANEL - Abnormal; Notable for the following components:      Result Value   CO2 18 (*)    Glucose, Bld 112 (*)    Creatinine, Ser 1.47 (*)    AST 14 (*)    Total Bilirubin 2.7 (*)    All other components within normal limits  CBC WITH DIFFERENTIAL/PLATELET - Abnormal; Notable for the following components:   RBC 6.44 (*)    MCV 70.3 (*)    MCH 24.8 (*)    All other components within normal limits  URINALYSIS, W/ REFLEX TO CULTURE (INFECTION SUSPECTED) - Abnormal; Notable for the following components:   Glucose, UA >=500 (*)    All other components within normal limits  I-STAT CHEM 8, ED - Abnormal; Notable for the following components:   Potassium 3.4 (*)    Creatinine, Ser 1.40 (*)    Glucose, Bld 109 (*)    Calcium, Ion 1.03 (*)    All other components within normal limits   LIPASE, BLOOD    EKG None  Radiology CT ABDOMEN PELVIS W CONTRAST Result Date: 10/31/2023 CLINICAL DATA:  Abdominal pain, acute, nonlocalized EXAM: CT ABDOMEN AND PELVIS WITH CONTRAST TECHNIQUE: Multidetector CT imaging of the abdomen and pelvis was performed using the standard protocol following bolus administration of intravenous contrast. RADIATION  DOSE REDUCTION: This exam was performed according to the departmental dose-optimization program which includes automated exposure control, adjustment of the mA and/or kV according to patient size and/or use of iterative reconstruction technique. CONTRAST:  75mL OMNIPAQUE IOHEXOL 350 MG/ML SOLN COMPARISON:  CT abdomen pelvis 10/05/2018 FINDINGS: Lower chest: No acute abnormality. Hepatobiliary: No focal liver abnormality. No gallstones, gallbladder wall thickening, or pericholecystic fluid. No biliary dilatation. Pancreas: No focal lesion. Normal pancreatic contour. No surrounding inflammatory changes. No main pancreatic ductal dilatation. Spleen: Normal in size without focal abnormality.  Splenule noted. Adrenals/Urinary Tract: No adrenal nodule bilaterally. Bilateral kidneys enhance symmetrically. No hydronephrosis. No hydroureter. The urinary bladder is unremarkable. Stomach/Bowel: Stomach is within normal limits. No evidence of bowel wall thickening or dilatation. Colonic diverticulosis. Appendix appears normal. Vascular/Lymphatic: No abdominal aorta or iliac aneurysm. No abdominal, pelvic, or inguinal lymphadenopathy. Reproductive: Prostate is unremarkable. Other: Perihepatic fat necrosis with associated calcification in a patient with prior omental infarction. No intraperitoneal free fluid. No intraperitoneal free gas. No organized fluid collection. Musculoskeletal: No abdominal wall hernia or abnormality. No suspicious lytic or blastic osseous lesions. No acute displaced fracture. IMPRESSION: 1. No acute intra-abdominal or intrapelvic abnormality. 2.  Colonic diverticulosis with no acute diverticulitis. Electronically Signed   By: Tish Frederickson M.D.   On: 10/31/2023 23:54    Procedures Procedures    Medications Ordered in ED Medications  iohexol (OMNIPAQUE) 350 MG/ML injection 75 mL (75 mLs Intravenous Contrast Given 10/31/23 2159)    ED Course/ Medical Decision Making/ A&P                                 Medical Decision Making Amount and/or Complexity of Data Reviewed Labs: ordered. Radiology: ordered and independent interpretation performed. ECG/medicine tests: ordered and independent interpretation performed.  Risk Prescription drug management.   38 year old male presenting to the ED with abdominal discomfort and diarrhea.  Whole family currently experiencing symptoms after eating [employer] Monday evening.  Denies any bloody stools or vomiting.    He is afebrile and nontoxic in appearance here.  Abdomen is soft and nontender, no peritoneal signs.  Labs are reassuring without leukocytosis.  Bicarb mildly low at 18 but maintains normal anion gap.  Glucose 112.  Not consistent with DKA, suspect likely from GI losses.  CT without acute findings, diverticulosis without diverticulitis.  No diarrhea in the past few hours.  Stable for discharge.  Prescriptions sent in for symptomatic care, encouraged good oral hydration, bland diet and advance as tolerated.  Can follow-up with PCP.  Return here for new concerns.  Final Clinical Impression(s) / ED Diagnoses Final diagnoses:  Abdominal discomfort  Diarrhea, unspecified type    Rx / DC Orders ED Discharge Orders          Ordered    dicyclomine (BENTYL) 20 MG tablet  2 times daily        11/01/23 0330    loperamide (IMODIUM) 2 MG capsule  4 times daily PRN        11/01/23 0330    ondansetron (ZOFRAN-ODT) 4 MG disintegrating tablet  Every 8 hours PRN        11/01/23 0330              Garlon Hatchet, PA-C 11/01/23 1610    Glynn Octave, MD 11/01/23 [phone]

## 2023-11-01 NOTE — Addendum Note (Signed)
 Addended by: [name] B on: 11/01/2023 11:22 AM   Modules accepted: Orders

## 2023-11-01 NOTE — ED Notes (Signed)
 AVS provided by edp was reviewed with pt. Pt verbalized understanding with no additional questions at this time. Pt confirmed pharmacy. Pt going home with family in lobby.

## 2023-11-02 ENCOUNTER — Other Ambulatory Visit: Payer: Self-pay

## 2023-11-08 ENCOUNTER — Other Ambulatory Visit: Payer: Self-pay

## 2023-11-09 ENCOUNTER — Other Ambulatory Visit: Payer: Self-pay

## 2023-11-09 ENCOUNTER — Other Ambulatory Visit: Payer: Self-pay | Admitting: Internal Medicine

## 2023-11-09 DIAGNOSIS — E1169 Type 2 diabetes mellitus with other specified complication: Secondary | ICD-10-CM

## 2023-11-09 MED ORDER — INSUPEN PEN NEEDLES 32G X 4 MM MISC
1.0000 | Freq: Every day | 3 refills | Status: AC
Start: 2023-11-09 — End: ?
  Filled 2023-11-09 – 2023-11-23 (×2): qty 100, 100d supply, fill #0
  Filled 2024-02-24: qty 100, 100d supply, fill #1
  Filled 2024-06-11: qty 100, 100d supply, fill #2

## 2023-11-09 MED ORDER — AMLODIPINE BESYLATE 10 MG PO TABS
10.0000 mg | ORAL_TABLET | Freq: Every day | ORAL | 1 refills | Status: DC
Start: 2023-11-09 — End: 2024-03-21
  Filled 2023-11-09: qty 90, 90d supply, fill #0
  Filled 2024-02-03 – 2024-02-05 (×2): qty 90, 90d supply, fill #1

## 2023-11-14 ENCOUNTER — Encounter: Payer: Self-pay | Admitting: Internal Medicine

## 2023-11-15 ENCOUNTER — Other Ambulatory Visit: Payer: Self-pay

## 2023-11-15 ENCOUNTER — Encounter: Payer: Self-pay | Admitting: Internal Medicine

## 2023-11-15 ENCOUNTER — Other Ambulatory Visit: Payer: Self-pay | Admitting: Internal Medicine

## 2023-11-15 MED ORDER — INSULIN GLARGINE 100 UNITS/ML SOLOSTAR PEN
18.0000 [IU] | PEN_INJECTOR | Freq: Every day | SUBCUTANEOUS | 11 refills | Status: AC
  Filled 2023-11-15 – 2023-12-02 (×4): qty 15, 83d supply, fill #0
  Filled 2024-02-24: qty 15, 83d supply, fill #1
  Filled 2024-05-20: qty 15, 83d supply, fill #2
  Filled 2024-08-15: qty 15, 83d supply, fill #3

## 2023-11-15 MED ORDER — SEMAGLUTIDE (1 MG/DOSE) 4 MG/3ML ~~LOC~~ SOPN
1.0000 mg | PEN_INJECTOR | SUBCUTANEOUS | 1 refills | Status: DC
  Filled 2023-11-15 – 2023-11-23 (×2): qty 3, 28d supply, fill #0
  Filled 2023-12-22: qty 3, 28d supply, fill #1

## 2023-11-21 ENCOUNTER — Other Ambulatory Visit: Payer: Self-pay

## 2023-11-23 ENCOUNTER — Other Ambulatory Visit: Payer: Self-pay

## 2023-11-24 ENCOUNTER — Other Ambulatory Visit: Payer: Self-pay

## 2023-11-27 ENCOUNTER — Encounter: Payer: Self-pay | Admitting: Cardiovascular Disease

## 2023-11-27 ENCOUNTER — Ambulatory Visit: Attending: Cardiovascular Disease | Admitting: Cardiovascular Disease

## 2023-11-27 VITALS — BP 130/94 | HR 86 | Ht 72.0 in | Wt 389.0 lb

## 2023-11-27 DIAGNOSIS — G4733 Obstructive sleep apnea (adult) (pediatric): Secondary | ICD-10-CM

## 2023-11-27 DIAGNOSIS — I10 Essential (primary) hypertension: Secondary | ICD-10-CM

## 2023-11-27 DIAGNOSIS — I7121 Aneurysm of the ascending aorta, without rupture: Secondary | ICD-10-CM

## 2023-11-27 DIAGNOSIS — I517 Cardiomegaly: Secondary | ICD-10-CM

## 2023-11-27 DIAGNOSIS — I5032 Chronic diastolic (congestive) heart failure: Secondary | ICD-10-CM

## 2023-11-27 NOTE — Assessment & Plan Note (Signed)
 Morbid obesity with a BMI of 53.  He was restarted on Ozempic a few weeks ago by his PCP.

## 2023-11-27 NOTE — Assessment & Plan Note (Signed)
 History of essential hypertension with blood pressure measured today at 100/65.  He is on amlodipine, carvedilol, hydralazine, losartan and hydrochlorothiazide.

## 2023-11-27 NOTE — Patient Instructions (Addendum)
 Medication Instructions:  Your physician recommends that you continue on your current medications as directed. Please refer to the Current Medication list given to you today.  *If you need a refill on your cardiac medications before your next appointment, please call your pharmacy*  Testing/Procedures: Your physician has requested that you have an echocardiogram. Echocardiography is a painless test that uses sound waves to create images of your heart. It provides your doctor with information about the size and shape of your heart and how well your heart's chambers and valves are working. This procedure takes approximately one hour. There are no restrictions for this procedure. Please do NOT wear cologne, perfume, aftershave, or lotions (deodorant is allowed). Please arrive 15 minutes prior to your appointment time. **To do in May**  Please note: We ask at that you not bring children with you during ultrasound (echo/ vascular) testing. Due to room size and safety concerns, children are not allowed in the ultrasound rooms during exams. Our front office staff cannot provide observation of children in our lobby area while testing is being conducted. An adult accompanying a patient to their appointment will only be allowed in the ultrasound room at the discretion of the ultrasound technician under special circumstances. We apologize for any inconvenience.   Follow-Up: At [facility], you and your health needs are our priority.  As part of our continuing mission to provide you with exceptional heart care, our providers are all part of one team.  This team includes your primary Cardiologist (physician) and Advanced Practice Providers or APPs (Physician Assistants and Nurse Practitioners) who all work together to provide you with the care you need, when you need it.  Your next appointment:   12 month(s)  Provider:   Nanetta Batty, MD     We recommend signing up for the patient portal called  "MyChart".  Sign up information is provided on this After Visit Summary.  MyChart is used to connect with patients for Virtual Visits (Telemedicine).  Patients are able to view lab/test results, encounter notes, upcoming appointments, etc.  Non-urgent messages can be sent to your provider as well.   To learn more about what you can do with MyChart, go to [email or web address].   Other Instructions       1st Floor: - Lobby - Registration  - Pharmacy  - Lab - Cafe  2nd Floor: - PV Lab - Diagnostic Testing (echo, CT, nuclear med)  3rd Floor: - Vacant  4th Floor: - TCTS (cardiothoracic surgery) - AFib Clinic - Structural Heart Clinic - Vascular Surgery  - Vascular Ultrasound  5th Floor: - HeartCare Cardiology (general and EP) - Clinical Pharmacy for coumadin, hypertension, lipid, weight-loss medications, and med management appointments    Valet parking services will be available as well.

## 2023-11-27 NOTE — Assessment & Plan Note (Signed)
History of obstructive sleep apnea on CPAP which he benefits from 

## 2023-11-27 NOTE — Assessment & Plan Note (Signed)
 History of small ACE thoracic aortic aneurysm in the past by 2D echo most recently checked 01/12/2023 revealing max dimension of 38 mm.  This will not need to be rechecked again.

## 2023-11-27 NOTE — Assessment & Plan Note (Signed)
 History of severe LVH which is concentric by 2D echo 01/12/2023 most likely related to his hypertension.

## 2023-11-27 NOTE — Assessment & Plan Note (Signed)
 History of hyperlipidemia on statin therapy with lipid profile performed 09/23/2022 revealing total cholesterol 120, LDL 65 HDL 31.

## 2023-11-27 NOTE — Assessment & Plan Note (Signed)
 History of chronic diastolic heart failure by 2D echo performed 01/12/2023 with grade 1 diastolic dysfunction on oral diuretics.

## 2023-11-27 NOTE — Progress Notes (Signed)
 11/27/2023 Paul Lamb [facility]   August 09, 1986  [id number]  Primary Physician Marcine Matar, MD Primary Cardiologist: Runell Gess MD Paul Lamb, [place]  HPI:  Paul Lamb is a 38 y.o.   morbidly overweight married African-American male father of 4 children who works as a Agricultural consultant.  I last saw him in the office/23/24.  His risk factors include treated diabetes, hypertension and hyperlipidemia.  He does not smoke.  He does have obstructive sleep apnea on CPAP.  He denies chest pain or shortness of breath.  2D echo performed 01/25/2021 revealed normal LV systolic function with severe LVH and a small ascending thoracic aortic aneurysm measuring 40 mm.  Since I saw him a year ago he continues to do well.  He is lost 11 pounds.  He was recently started on Ozempic by his PCP.  He denies chest pain or shortness of breath.  2D echo performed 01/12/2023 revealed normal LV systolic function since I saw him a year ago he continues to do well.  He is lost 11 pounds.  He was recently started on Ozempic by his PCP.  He denies chest pain or shortness of breath, severe concentric LVH with grade 1 diastolic dysfunction and an ascending aorta measuring 38 mm.   Current Meds  Medication Sig   amLODipine (NORVASC) 10 MG tablet Take 1 tablet (10 mg total) by mouth daily.   aspirin 81 MG EC tablet Take 1 tablet (81 mg total) by mouth daily.   atorvastatin (LIPITOR) 40 MG tablet Take 1 tablet (40 mg total) by mouth daily at 6 PM.   carvedilol (COREG) 25 MG tablet Take 1 tablet (25 mg total) by mouth 2 (two) times daily with a meal.   Continuous Glucose Receiver (DEXCOM G7 RECEIVER) DEVI Use as directed to check blood sugars.   E11.69, E66.01   Continuous Glucose Sensor (DEXCOM G7 SENSOR) MISC Change every 10 days.  E11.69, E66.01   Continuous Glucose Sensor (FREESTYLE LIBRE 2 SENSOR) MISC CHANGE SENSOR EVERY 2 WEEKS   dapagliflozin propanediol (FARXIGA) 10 MG TABS tablet Take 1 tablet  (10 mg total) by mouth daily.   furosemide (LASIX) 20 MG tablet Take 1 tablet (20 mg total) by mouth daily.   glipiZIDE (GLUCOTROL) 10 MG tablet Take 1 tablet (10 mg total) by mouth 2 (two) times daily.   hydrALAZINE (APRESOLINE) 100 MG tablet Take 1 tablet (100 mg total) by mouth 3 (three) times daily.   hydrochlorothiazide (HYDRODIURIL) 25 MG tablet Take 1 tablet (25 mg total) by mouth daily.   insulin glargine (LANTUS) 100 unit/mL SOPN Inject 18 Units into the skin at bedtime.   Insulin Pen Needle (INSUPEN PEN NEEDLES) 32G X 4 MM MISC Inject 1 Pen into the skin daily with lantus.   losartan (COZAAR) 100 MG tablet Take 1 tablet (100 mg total) by mouth daily.   metFORMIN (GLUCOPHAGE) 1000 MG tablet Take 1 tablet (1,000 mg total) by mouth 2 (two) times daily with a meal.   omeprazole (PRILOSEC) 20 MG capsule Take 1 capsule (20 mg total) by mouth daily.   Semaglutide, 1 MG/DOSE, 4 MG/3ML SOPN Inject 1 mg as directed once a week.   sildenafil (VIAGRA) 100 MG tablet Take 1 tablet (100 mg total) by mouth 30 minutes to 1 hour before intercourse as needed.     Allergies  Allergen Reactions   Imdur [Isosorbide Dinitrate] Other (See Comments)    Headaches    Isosorbide Nitrate  headaches   Penicillins Other (See Comments)    From childhood; reaction not known: Has patient had a PCN reaction causing immediate rash, facial/tongue/throat swelling, SOB or lightheadedness with hypotension: Unk Has patient had a PCN reaction causing severe rash involving mucus membranes or skin necrosis: Unk Has patient had a PCN reaction that required hospitalization: Unk Has patient had a PCN reaction occurring within the last 10 years: No If all of the above answers are "NO", then may proceed with Cephalosporin use.     Social History   Socioeconomic History   Marital status: Single    Spouse name: Not on file   Number of children: Not on file   Years of education: Not on file   Highest education level:  Not on file  Occupational History   Not on file  Tobacco Use   Smoking status: Never   Smokeless tobacco: Never  Vaping Use   Vaping status: Never Used  Substance and Sexual Activity   Alcohol use: Yes    Alcohol/week: 0.0 standard drinks of alcohol   Drug use: No   Sexual activity: Yes  Other Topics Concern   Not on file  Social History Narrative   Not on file   Social Drivers of Health   Financial Resource Strain: Low Risk  (10/27/2023)   Overall Financial Resource Strain (CARDIA)    Difficulty of Paying Living Expenses: Not very hard  Food Insecurity: No Food Insecurity (10/27/2023)   Hunger Vital Sign    Worried About Running Out of Food in the Last Year: Never true    Ran Out of Food in the Last Year: Never true  Transportation Needs: No Transportation Needs (10/27/2023)   PRAPARE - Administrator, Civil Service (Medical): No    Lack of Transportation (Non-Medical): No  Physical Activity: Sufficiently Active (10/27/2023)   Exercise Vital Sign    Days of Exercise per Week: 5 days    Minutes of Exercise per Session: 30 min  Stress: Stress Concern Present (10/27/2023)   [employer] of Occupational Health - Occupational Stress Questionnaire    Feeling of Stress : To some extent  Social Connections: Socially Isolated (10/27/2023)   Social Connection and Isolation Panel [NHANES]    Frequency of Communication with Friends and Family: Three times a week    Frequency of Social Gatherings with Friends and Family: Once a week    Attends Religious Services: Never    Database administrator or Organizations: No    Attends Banker Meetings: Never    Marital Status: Separated  Intimate Partner Violence: Not At Risk (10/27/2023)   Humiliation, Afraid, Rape, and Kick questionnaire    Fear of Current or Ex-Partner: No    Emotionally Abused: No    Physically Abused: No    Sexually Abused: No     Review of Systems: General: negative for chills, fever, night  sweats or weight changes.  Cardiovascular: negative for chest pain, dyspnea on exertion, edema, orthopnea, palpitations, paroxysmal nocturnal dyspnea or shortness of breath Dermatological: negative for rash Respiratory: negative for cough or wheezing Urologic: negative for hematuria Abdominal: negative for nausea, vomiting, diarrhea, bright red blood per rectum, melena, or hematemesis Neurologic: negative for visual changes, syncope, or dizziness All other systems reviewed and are otherwise negative except as noted above.    Blood pressure (!) 130/94, pulse 86, height 6' (1.829 m), weight (!) 389 lb (176.4 kg), SpO2 93%.  General appearance: alert and no distress Neck:  no adenopathy, no carotid bruit, no JVD, supple, symmetrical, trachea midline, and thyroid not enlarged, symmetric, no tenderness/mass/nodules Lungs: clear to auscultation bilaterally Heart: regular rate and rhythm, S1, S2 normal, no murmur, click, rub or gallop Extremities: extremities normal, atraumatic, no cyanosis or edema Pulses: 2+ and symmetric Skin: Skin color, texture, turgor normal. No rashes or lesions Neurologic: Grossly normal  EKG EKG Interpretation Date/Time:  Monday November 27 2023 15:03:08 EDT Ventricular Rate:  80 PR Interval:  170 QRS Duration:  96 QT Interval:  372 QTC Calculation: 429 R Axis:   -6  Text Interpretation: Normal sinus rhythm Normal ECG When compared with ECG of 28-Jan-2023 12:49, No significant change was found Confirmed by Nanetta Batty [phone]) on 11/27/2023 3:04:18 PM    ASSESSMENT AND PLAN:   Sleep apnea History of obstructive sleep apnea on CPAP which he benefits from.  Morbid obesity (HCC) Morbid obesity with a BMI of 53.  He was restarted on Ozempic a few weeks ago by his PCP.  Essential hypertension History of essential hypertension with blood pressure measured today at 100/65.  He is on amlodipine, carvedilol, hydralazine, losartan and hydrochlorothiazide.  Diastolic  congestive heart failure (HCC) History of chronic diastolic heart failure by 2D echo performed 01/12/2023 with grade 1 diastolic dysfunction on oral diuretics.  Left ventricular hypertrophy History of severe LVH which is concentric by 2D echo 01/12/2023 most likely related to his hypertension.  Thoracic ascending aortic aneurysm (HCC) History of small ACE thoracic aortic aneurysm in the past by 2D echo most recently checked 01/12/2023 revealing max dimension of 38 mm.  This will not need to be rechecked again.  Hyperlipidemia History of hyperlipidemia on statin therapy with lipid profile performed 09/23/2022 revealing total cholesterol 120, LDL 65 HDL 31.     Runell Gess MD [facility], [facility] 11/27/2023 3:13 PM

## 2023-12-01 NOTE — Progress Notes (Deleted)
 S:     No chief complaint on file.  38 y.o. male who presents for diabetes evaluation, education, and management. Patient arrives in *** good spirits and presents without *** any assistance. ***Patient is accompanied by ***.   Patient was referred and last seen by Primary Care Provider, Dr. Lincoln Renshaw, on 10/27/23. At last appointment he was transitioned from Trulicity to Ozempic for further weight loss and glycemic control. Also started on omeprazole for GERD. Went to ED on 10/31/23 for abdominal pain and diarrhea after eating [employer] (whole family had symptoms). Patient sent a MyChart message on 11/14/23 updating on Ozempic tolerability - and was instructed to increase to 1 mg dose.  PMH is significant for HTN, HFpEF, OSA on CPAP, T2DM, BMI > 50, HLD.  At last visit, ***.   Patient reports Diabetes was diagnosed in ***.   Family/Social History: ***  Current diabetes medications include: Lantus 18 units daily, Ozempic 1 mg weekly, Farxiga 10 mg daily, glipizide 10 mg BID Current hypertension medications include: amlodipine 10 mg daily, carvedilol 25 mg BID, losartan 100 mg daily, hydralazine 100 mg TID< hydrochlorothiazide 25 mg daily, Lasix 20 mg daily Current hyperlipidemia medications include: atorvastatin 40 mg daily  Patient reports adherence to taking all medications as prescribed.  *** Patient denies adherence with medications, reports missing *** medications *** times per week, on average.  Do you feel that your medications are working for you? {YES NO:22349} Have you been experiencing any side effects to the medications prescribed? {YES NO:22349} Do you have any problems obtaining medications due to transportation or finances? {YES [id number] Insurance coverage: ***  Patient {Actions; denies-reports:120008} hypoglycemic events.  Reported home fasting blood sugars: ***  Reported 2 hour post-meal/random blood sugars: ***.  Patient {Actions; denies-reports:120008} nocturia  (nighttime urination).  Patient {Actions; denies-reports:120008} neuropathy (nerve pain). Patient {Actions; denies-reports:120008} visual changes. Patient {Actions; denies-reports:120008} self foot exams.   Patient reported dietary habits: Eats *** meals/day Breakfast: *** Lunch: *** Dinner: *** Snacks: *** Drinks: ***  Within the past 12 months, did you worry whether your food would run out before you got money to buy more? {YES NO:22349} Within the past 12 months, did the food you bought run out, and you didn't have money to get more? 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  UACR 10/27/23: <3 mg/g  Lipid Panel     Component Value Date/Time   CHOL 120 09/23/2022 1030   TRIG 138 09/23/2022 1030   HDL 31 (L) 09/23/2022 1030   CHOLHDL 3.9 09/23/2022 1030   CHOLHDL 7.6 09/10/2017 0451   VLDL 29 09/10/2017 0451   LDLCALC 65 09/23/2022 1030    Clinical Atherosclerotic Cardiovascular Disease (ASCVD): {YES/NO:21197} The ASCVD Risk score (Arnett DK, et al., 2019) failed to calculate for the following reasons:   The 2019 ASCVD risk score is only valid for ages 54 to 35   Patient is participating in  a Managed Medicaid Plan:  No - [employer]  A/P: Diabetes longstanding *** currently ***. Patient is *** able to verbalize appropriate hypoglycemia management plan. Medication adherence appears ***. Control is suboptimal due to ***. -{Meds  adjust:18428} basal insulin *** Lantus/Basaglar/Semglee (insulin glargine) *** Tresiba (insulin degludec) from *** units to *** units daily in the morning. Patient will continue to titrate 1 unit every *** days if fasting blood sugar > 100mg /dl until fasting blood sugars reach goal or next visit.  -{Meds adjust:18428} rapid insulin *** Novolog (insulin aspart) *** Humalog (insulin lispro) from *** to ***.  -{Meds adjust:18428} GLP-1 *** Trulicity (dulaglutide) *** Ozempic (semaglutide) *** Mounjaro (tirzepatide) from *** mg to *** mg .  -{Meds adjust:18428} SGLT2-I *** Farxiga (dapagliflozin) *** Jardiance (empagliflozin) 10 mg. Counseled on sick day rules. -{Meds adjust:18428} metformin ***.  -Patient educated on purpose, proper use, and potential adverse effects of ***.  -Extensively discussed pathophysiology of diabetes, recommended lifestyle interventions, dietary effects on blood sugar control.  -Counseled on s/sx of and management of hypoglycemia.  -Next A1c anticipated ***.   ASCVD risk - primary ***secondary prevention in patient with diabetes. Last LDL is *** not at goal of <57 *** mg/dL. ASCVD risk factors include *** and 10-year ASCVD risk score of ***. {Desc; low/moderate/high:110033} intensity statin indicated.  -{Meds adjust:18428} ***statin *** mg.   Hypertension longstanding *** currently ***. Blood pressure goal of <130/80 *** mmHg. Medication adherence ***. Blood pressure control is suboptimal due to ***. -{Meds adjust:18428} *** mg.  Written patient instructions provided. Patient verbalized understanding of treatment plan.  Total time in face to face counseling *** minutes.    Follow-up:  Pharmacist *** PCP clinic visit in *** Patient seen with ***

## 2023-12-04 ENCOUNTER — Other Ambulatory Visit: Payer: Self-pay

## 2023-12-04 ENCOUNTER — Ambulatory Visit: Admitting: Pharmacist

## 2023-12-25 ENCOUNTER — Other Ambulatory Visit: Payer: Self-pay

## 2023-12-29 ENCOUNTER — Other Ambulatory Visit: Payer: Self-pay

## 2024-01-11 ENCOUNTER — Encounter: Payer: Self-pay | Admitting: Internal Medicine

## 2024-01-11 ENCOUNTER — Other Ambulatory Visit: Payer: Self-pay

## 2024-01-11 ENCOUNTER — Other Ambulatory Visit: Payer: Self-pay | Admitting: Internal Medicine

## 2024-01-11 MED ORDER — OZEMPIC (1 MG/DOSE) 4 MG/3ML ~~LOC~~ SOPN
1.0000 mg | PEN_INJECTOR | SUBCUTANEOUS | 1 refills | Status: DC
  Filled 2024-01-11: qty 3, 28d supply, fill #0

## 2024-01-12 ENCOUNTER — Ambulatory Visit (HOSPITAL_COMMUNITY)
Admission: RE | Admit: 2024-01-12 | Discharge: 2024-01-12 | Disposition: A | Discharge status: Home / Self Care | Source: Ambulatory Visit | Attending: Cardiovascular Disease | Admitting: Cardiovascular Disease

## 2024-01-12 DIAGNOSIS — I7121 Aneurysm of the ascending aorta, without rupture: Secondary | ICD-10-CM | POA: Diagnosis present

## 2024-01-12 DIAGNOSIS — I517 Cardiomegaly: Secondary | ICD-10-CM | POA: Diagnosis not present

## 2024-01-12 DIAGNOSIS — I5032 Chronic diastolic (congestive) heart failure: Secondary | ICD-10-CM | POA: Diagnosis not present

## 2024-01-12 DIAGNOSIS — I10 Essential (primary) hypertension: Secondary | ICD-10-CM | POA: Diagnosis present

## 2024-01-12 DIAGNOSIS — G4733 Obstructive sleep apnea (adult) (pediatric): Secondary | ICD-10-CM | POA: Diagnosis present

## 2024-01-12 LAB — ECHOCARDIOGRAM COMPLETE
AR max vel: 3.27 cm2
AV Area VTI: 2.94 cm2
AV Area mean vel: 3.17 cm2
AV Mean grad: 2 mmHg
AV Peak grad: 3.5 mmHg
Ao pk vel: 0.94 m/s
Area-P 1/2: 3.27 cm2
MV M vel: 1.37 m/s
MV Peak grad: 7.5 mmHg
S' Lateral: 2.99 cm

## 2024-01-12 MED ORDER — PERFLUTREN LIPID MICROSPHERE
1.0000 mL | INTRAVENOUS | Status: AC | PRN
  Administered 2024-01-12: 3 mL via INTRAVENOUS

## 2024-01-13 ENCOUNTER — Ambulatory Visit: Payer: Self-pay | Admitting: Cardiology

## 2024-01-13 NOTE — Progress Notes (Signed)
 Essentially normal echocardiogram.

## 2024-01-15 ENCOUNTER — Other Ambulatory Visit: Payer: Self-pay | Admitting: Internal Medicine

## 2024-01-15 MED ORDER — SEMAGLUTIDE (2 MG/DOSE) 8 MG/3ML ~~LOC~~ SOPN
2.0000 mg | PEN_INJECTOR | SUBCUTANEOUS | 2 refills | Status: DC
Start: 2024-01-15 — End: 2024-03-21
  Filled 2024-01-15: qty 3, 28d supply, fill #0
  Filled 2024-02-08: qty 3, 28d supply, fill #1
  Filled 2024-03-04: qty 3, 28d supply, fill #2

## 2024-01-16 ENCOUNTER — Other Ambulatory Visit: Payer: Self-pay

## 2024-01-18 ENCOUNTER — Other Ambulatory Visit: Payer: Self-pay

## 2024-02-05 ENCOUNTER — Other Ambulatory Visit: Payer: Self-pay

## 2024-02-06 ENCOUNTER — Other Ambulatory Visit: Payer: Self-pay

## 2024-02-07 ENCOUNTER — Other Ambulatory Visit: Payer: Self-pay

## 2024-02-08 ENCOUNTER — Other Ambulatory Visit: Payer: Self-pay

## 2024-02-09 ENCOUNTER — Other Ambulatory Visit: Payer: Self-pay

## 2024-02-26 ENCOUNTER — Ambulatory Visit: Admitting: Internal Medicine

## 2024-02-26 ENCOUNTER — Other Ambulatory Visit: Payer: Self-pay

## 2024-02-28 ENCOUNTER — Other Ambulatory Visit: Payer: Self-pay

## 2024-03-05 ENCOUNTER — Other Ambulatory Visit: Payer: Self-pay

## 2024-03-13 ENCOUNTER — Other Ambulatory Visit: Payer: Self-pay

## 2024-03-16 ENCOUNTER — Other Ambulatory Visit: Payer: Self-pay | Admitting: Internal Medicine

## 2024-03-18 ENCOUNTER — Other Ambulatory Visit: Payer: Self-pay

## 2024-03-18 MED ORDER — SILDENAFIL CITRATE 100 MG PO TABS
100.0000 mg | ORAL_TABLET | ORAL | 2 refills | Status: AC
  Filled 2024-03-18: qty 10, 30d supply, fill #0
  Filled 2024-05-11: qty 10, 30d supply, fill #1
  Filled 2024-07-18: qty 10, 30d supply, fill #2

## 2024-03-19 ENCOUNTER — Other Ambulatory Visit: Payer: Self-pay

## 2024-03-20 ENCOUNTER — Telehealth: Payer: Self-pay | Admitting: Internal Medicine

## 2024-03-20 ENCOUNTER — Other Ambulatory Visit: Payer: Self-pay

## 2024-03-20 NOTE — Telephone Encounter (Signed)
 Confirmed appt for 7*/31

## 2024-03-21 ENCOUNTER — Encounter: Payer: Self-pay | Admitting: Internal Medicine

## 2024-03-21 ENCOUNTER — Ambulatory Visit: Attending: Internal Medicine | Admitting: Internal Medicine

## 2024-03-21 ENCOUNTER — Other Ambulatory Visit: Payer: Self-pay

## 2024-03-21 DIAGNOSIS — E1169 Type 2 diabetes mellitus with other specified complication: Secondary | ICD-10-CM

## 2024-03-21 DIAGNOSIS — E785 Hyperlipidemia, unspecified: Secondary | ICD-10-CM | POA: Diagnosis not present

## 2024-03-21 DIAGNOSIS — Z7984 Long term (current) use of oral hypoglycemic drugs: Secondary | ICD-10-CM

## 2024-03-21 DIAGNOSIS — I5030 Unspecified diastolic (congestive) heart failure: Secondary | ICD-10-CM

## 2024-03-21 DIAGNOSIS — Z7985 Long-term (current) use of injectable non-insulin antidiabetic drugs: Secondary | ICD-10-CM | POA: Diagnosis not present

## 2024-03-21 DIAGNOSIS — Z794 Long term (current) use of insulin: Secondary | ICD-10-CM

## 2024-03-21 DIAGNOSIS — Z6841 Body Mass Index (BMI) 40.0 and over, adult: Secondary | ICD-10-CM

## 2024-03-21 DIAGNOSIS — I152 Hypertension secondary to endocrine disorders: Secondary | ICD-10-CM

## 2024-03-21 DIAGNOSIS — E1159 Type 2 diabetes mellitus with other circulatory complications: Secondary | ICD-10-CM

## 2024-03-21 DIAGNOSIS — E119 Type 2 diabetes mellitus without complications: Secondary | ICD-10-CM

## 2024-03-21 LAB — POCT GLYCOSYLATED HEMOGLOBIN (HGB A1C): HbA1c, POC (controlled diabetic range): 6.2 % (ref 0.0–7.0)

## 2024-03-21 LAB — GLUCOSE, POCT (MANUAL RESULT ENTRY): POC Glucose: 95 mg/dL (ref 70–99)

## 2024-03-21 MED ORDER — SEMAGLUTIDE (2 MG/DOSE) 8 MG/3ML ~~LOC~~ SOPN
2.0000 mg | PEN_INJECTOR | SUBCUTANEOUS | 2 refills | Status: DC
  Filled 2024-03-21 – 2024-04-04 (×2): qty 3, 28d supply, fill #0
  Filled 2024-05-02: qty 3, 28d supply, fill #1
  Filled 2024-05-31: qty 3, 28d supply, fill #2

## 2024-03-21 MED ORDER — GLIPIZIDE 10 MG PO TABS
10.0000 mg | ORAL_TABLET | Freq: Two times a day (BID) | ORAL | 1 refills | Status: AC
  Filled 2024-03-21 – 2024-05-02 (×3): qty 180, 90d supply, fill #0
  Filled 2024-08-15: qty 180, 90d supply, fill #1

## 2024-03-21 MED ORDER — AMLODIPINE BESYLATE 10 MG PO TABS
10.0000 mg | ORAL_TABLET | Freq: Every day | ORAL | 1 refills | Status: AC
  Filled 2024-03-21 – 2024-05-11 (×2): qty 90, 90d supply, fill #0
  Filled 2024-08-15: qty 90, 90d supply, fill #1

## 2024-03-21 MED ORDER — ATORVASTATIN CALCIUM 40 MG PO TABS
40.0000 mg | ORAL_TABLET | Freq: Every day | ORAL | 1 refills | Status: AC
  Filled 2024-03-21 – 2024-05-11 (×2): qty 90, 90d supply, fill #0
  Filled 2024-08-15: qty 90, 90d supply, fill #1

## 2024-03-21 MED ORDER — HYDROCHLOROTHIAZIDE 25 MG PO TABS
25.0000 mg | ORAL_TABLET | Freq: Every day | ORAL | 1 refills | Status: AC
  Filled 2024-03-21 – 2024-05-11 (×2): qty 90, 90d supply, fill #0
  Filled 2024-08-15: qty 90, 90d supply, fill #1

## 2024-03-21 MED ORDER — LOSARTAN POTASSIUM 100 MG PO TABS
100.0000 mg | ORAL_TABLET | Freq: Every day | ORAL | 1 refills | Status: AC
  Filled 2024-03-21 – 2024-05-11 (×2): qty 90, 90d supply, fill #0
  Filled 2024-08-15: qty 90, 90d supply, fill #1

## 2024-03-21 MED ORDER — FUROSEMIDE 20 MG PO TABS
20.0000 mg | ORAL_TABLET | Freq: Every day | ORAL | 1 refills | Status: AC
  Filled 2024-03-21 – 2024-05-11 (×2): qty 90, 90d supply, fill #0
  Filled 2024-08-15: qty 90, 90d supply, fill #1

## 2024-03-21 NOTE — Patient Instructions (Signed)
 Keep a manual glucometer with you in your truck.  Keep a pack of glucose tablets in your truck.

## 2024-03-21 NOTE — Progress Notes (Signed)
 Patient ID: Paul Lamb, male    DOB: 26-Jan-1986  MRN: [id number]  CC: Diabetes (DM f/u. Med refills. /No questions / concerns)   Subjective: Paul Lamb is a 38 y.o. male who presents for chronic ds management. His concerns today include:  Patient with history of HTN, diastolic CHF with EF 60-65% with G1DD, HTN, OSA on CPAP, HL, morbid obesity, diabetes type 2, depression.   DIABETES TYPE 2 Last A1C:   Results for orders placed or performed in visit on 03/21/24  POCT glucose (manual entry)   Collection Time: 03/21/24  3:55 PM  Result Value Ref Range   POC Glucose 95 70 - 99 mg/dl  POCT glycosylated hemoglobin (Hb A1C)   Collection Time: 03/21/24  4:40 PM  Result Value Ref Range   Hemoglobin A1C     HbA1c POC (<> result, manual entry)     HbA1c, POC (prediabetic range)     HbA1c, POC (controlled diabetic range) 6.2 0.0 - 7.0 %  Compliant with taking Ozempic  2 mg once a week, Lantus  insulin  18 units daily, Farxiga  10 mg daily and glipizide  10 mg twice a day.  Tolerating Ozempic  without any significant side effects.  He is doing well with eating habits.  He tries to get into the gym at least 3 days a week. - CMG shows for the past 2 weeks time in range 92% with 7% high and less than 1% low.  For the past 1 month: TIR 93%, high 6% and low less than 1%.  90 days: DIM 90%, high 10% and low less than 1%.  He reports 1 episode of blood sugar dropping to 55 where he was woken up by his continuous glucose monitor.  He does not keep a manual glucometer with him to double check blood sugars when he is woken from sleep. - Reports having had his eye exam done 10/06/2023 at [facility] on [address]. 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  He is a Agricultural consultant.  HTN/CHFpEF: Confirms taking:  Norvasc  10 mg, Coreg  25 mg BID, Cozaar  100 mg, Hydralazine  100 mg TID, HCTZ 25 and lasix  20 mg.  Took meds  aready for the morning.  Limits salt. No SOB/LE edema.    HL: Taking and tolerating atorvastatin  40 mg daily  Discussed the use of AI scribe software for clinical note transcription with the patient, who gave verbal consent to proceed.  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   Left ventricular hypertrophy 12/13/2022   Thoracic ascending aortic aneurysm (HCC) 12/13/2022   Hyperlipidemia 12/13/2022   Type 2 diabetes mellitus with obesity (HCC) 12/29/2017   Diastolic congestive heart failure (HCC) 11/28/2017   Proteinuria    Erectile dysfunction 05/19/2017   Depression 05/19/2017   Essential hypertension 01/12/2016   Sleep apnea 01/12/2016   Abnormal EKG    Hand pain, left 11/01/2015   Morbid obesity (HCC) 11/01/2015     Current Outpatient Medications on File Prior to Visit  Medication Sig Dispense Refill   aspirin  81 MG EC tablet Take 1 tablet (81 mg total) by mouth daily. 100 tablet 1   carvedilol  (COREG ) 25 MG tablet Take 1 tablet (25 mg total) by mouth 2 (two) times daily with a meal. 180 tablet 1   Continuous Glucose Receiver (DEXCOM G7 RECEIVER) DEVI Use as directed to check blood sugars.  E11.69, E66.01 1 each 0   Continuous Glucose Sensor (DEXCOM G7 SENSOR) MISC Change every 10 days.  E11.69, E66.01 3 each 6   Continuous Glucose Sensor (FREESTYLE LIBRE 2 SENSOR) MISC CHANGE SENSOR EVERY 2 WEEKS 2 each 11   dapagliflozin  propanediol (FARXIGA ) 10 MG TABS tablet Take 1 tablet (10 mg total) by mouth daily. 90 tablet 1   hydrALAZINE  (APRESOLINE ) 100 MG tablet Take 1 tablet (100 mg total) by mouth 3 (three) times daily. 270 tablet 1   insulin  glargine (LANTUS ) 100 unit/mL SOPN Inject 18 Units into the skin at bedtime. 15 mL 11   Insulin  Pen Needle (INSUPEN PEN NEEDLES) 32G X 4 MM MISC Inject 1 Pen into the skin daily with lantus . 100 each 3   metFORMIN  (GLUCOPHAGE ) 1000 MG tablet Take 1 tablet (1,000 mg total) by mouth 2 (two)  times daily with a meal. 180 tablet 1   omeprazole  (PRILOSEC) 20 MG capsule Take 1 capsule (20 mg total) by mouth daily. 90 capsule 1   sildenafil  (VIAGRA ) 100 MG tablet Take 1 tablet (100 mg total) by mouth 30 minutes to 1 hour before intercourse as needed. 10 tablet 2   No current facility-administered medications on file prior to visit.    Allergies  Allergen Reactions   Imdur  [Isosorbide  Dinitrate] Other (See Comments)    Headaches    Isosorbide  Nitrate     headaches   Penicillins Other (See Comments)    From childhood; reaction not known: Has patient had a PCN reaction causing immediate rash, facial/tongue/throat swelling, SOB or lightheadedness with hypotension: Unk Has patient had a PCN reaction causing severe rash involving mucus membranes or skin necrosis: Unk Has patient had a PCN reaction that required hospitalization: Unk Has patient had a PCN reaction occurring within the last 10 years: No If all of the above answers are NO, then may proceed with Cephalosporin use.     Social History   Socioeconomic History   Marital status: Single    Spouse name: Not on file   Number of children: Not on file   Years of education: Not on file   Highest education level: Not on file  Occupational History   Not on file  Tobacco Use   Smoking status: Never   Smokeless tobacco: Never  Vaping Use   Vaping status: Never Used  Substance and Sexual Activity   Alcohol use: Yes    Alcohol/week: 0.0 standard drinks of alcohol   Drug use: No   Sexual activity: Yes  Other Topics Concern   Not on file  Social History Narrative   Not on file   Social Drivers of Health   Financial Resource Strain: Low Risk  (10/27/2023)   Overall Financial Resource Strain (CARDIA)    Difficulty of Paying Living Expenses: Not very hard  Food Insecurity: No Food Insecurity (10/27/2023)   Hunger Vital Sign    Worried About Running Out of Food in the Last Year: Never true    Ran Out of Food in the Last  Year: Never true  Transportation Needs: No Transportation Needs (10/27/2023)   PRAPARE - Administrator, Civil Service (Medical): No    Lack of Transportation (Non-Medical): No  Physical Activity: Sufficiently Active (10/27/2023)   Exercise Vital Sign    Days of Exercise per Week: 5 days    Minutes of Exercise per Session: 30 min  Stress: Stress Concern Present (10/27/2023)   [employer] of Occupational Health - Occupational Stress Questionnaire  Feeling of Stress : To some extent  Social Connections: Socially Isolated (10/27/2023)   Social Connection and Isolation Panel    Frequency of Communication with Friends and Family: Three times a week    Frequency of Social Gatherings with Friends and Family: Once a week    Attends Religious Services: Never    Database administrator or Organizations: No    Attends Banker Meetings: Never    Marital Status: Separated  Intimate Partner Violence: Not At Risk (10/27/2023)   Humiliation, Afraid, Rape, and Kick questionnaire    Fear of Current or Ex-Partner: No    Emotionally Abused: No    Physically Abused: No    Sexually Abused: No    Family History  Problem Relation Age of Onset   Hypertension Other    Diabetes Mellitus II Other     Past Surgical History:  Procedure Laterality Date   NO PAST SURGERIES      ROS: Review of Systems Negative except as stated above  PHYSICAL EXAM: BP 114/74 (BP Location: Left Arm, Patient Position: Sitting, Cuff Size: Large)   Pulse 89   Temp 98.3 F (36.8 C) (Oral)   Ht 6' (1.829 m)   Wt (!) 388 lb (176 kg)   SpO2 95%   BMI 52.62 kg/m   Wt Readings from Last 3 Encounters:  03/21/24 (!) 388 lb (176 kg)  11/27/23 (!) 389 lb (176.4 kg)  10/31/23 (!) 390 lb (176.9 kg)    Physical Exam   General appearance - alert, well appearing, obese middle age AAM and in no distress Mental status - normal mood, behavior, speech, dress, motor activity, and thought processes Neck -  supple, no significant adenopathy Chest - clear to auscultation, no wheezes, rales or rhonchi, symmetric air entry Heart - normal rate, regular rhythm, normal S1, S2, no murmurs, rubs, clicks or gallops Extremities - peripheral pulses normal, no pedal edema, no clubbing or cyanosis Diabetic Foot Exam - Simple   Simple Foot Form Diabetic Foot exam was performed with the following findings: Yes 03/21/2024  6:28 PM  Visual Inspection See comments: Yes Sensation Testing Intact to touch and monofilament testing bilaterally: Yes Pulse Check Posterior Tibialis and Dorsalis pulse intact bilaterally: Yes Comments Small callus buildup on the medial aspect of both big toes.        Latest Ref Rng & Units 10/31/2023    9:14 PM 10/31/2023    9:05 PM 04/29/2023   12:16 PM  CMP  Glucose 70 - 99 mg/dL 890  887    BUN 6 - 20 mg/dL 18  18    Creatinine 9.38 - 1.24 mg/dL 8.59  8.52    Sodium 864 - 145 mmol/L 137  135  137   Potassium 3.5 - 5.1 mmol/L 3.4  3.5  3.8   Chloride 98 - 111 mmol/L 102  103    CO2 22 - 32 mmol/L  18    Calcium  8.9 - 10.3 mg/dL  8.9    Total Protein 6.5 - 8.1 g/dL  7.0    Total Bilirubin 0.0 - 1.2 mg/dL  2.7    Alkaline Phos 38 - 126 U/L  65    AST 15 - 41 U/L  14    ALT 0 - 44 U/L  21     Lipid Panel     Component Value Date/Time   CHOL 120 09/23/2022 1030   TRIG 138 09/23/2022 1030   HDL 31 (L) 09/23/2022 1030  CHOLHDL 3.9 09/23/2022 1030   CHOLHDL 7.6 09/10/2017 0451   VLDL 29 09/10/2017 0451   LDLCALC 65 09/23/2022 1030    CBC    Component Value Date/Time   WBC 8.5 10/31/2023 2105   RBC 6.44 (H) 10/31/2023 2105   HGB 15.0 10/31/2023 2114   HGB 15.7 09/23/2022 1030   HCT 44.0 10/31/2023 2114   HCT 45.4 09/23/2022 1030   PLT 225 10/31/2023 2105   PLT 234 09/23/2022 1030   MCV 70.3 (L) 10/31/2023 2105   MCV 72 (L) 09/23/2022 1030   MCH 24.8 (L) 10/31/2023 2105   MCHC 35.3 10/31/2023 2105   RDW 15.4 10/31/2023 2105   RDW 17.1 (H) 09/23/2022 1030    LYMPHSABS 1.1 10/31/2023 2105   MONOABS 0.7 10/31/2023 2105   EOSABS 0.0 10/31/2023 2105   BASOSABS 0.0 10/31/2023 2105    ASSESSMENT AND PLAN: 1. Type 2 diabetes mellitus associated with morbid obesity (HCC) (Primary) At goal.  He is tolerating Ozempic  but has not had any significant weight loss.  He will continue Ozempic  2 mg once a week, glipizide  10 mg twice a day, metformin  1 g twice a day and Farxiga  10 mg daily.  Encouraged him to continue healthy eating habits and trying to move as much as he can. Advised to keep a manual glucometer with him in his truck Patient to get documentation from the ophthalmologist at [facility] where he had his eye exam done so that we can update his health maintenance and I can complete his form - POCT glycosylated hemoglobin (Hb A1C) - POCT glucose (manual entry) - glipiZIDE  (GLUCOTROL ) 10 MG tablet; Take 1 tablet (10 mg total) by mouth 2 (two) times daily.  Dispense: 180 tablet; Refill: 1 - Semaglutide , 2 MG/DOSE, 8 MG/3ML SOPN; Inject 2 mg as directed once a week.  Dispense: 3 mL; Refill: 2  2. Insulin  long-term use (HCC) 3. Diabetes mellitus treated with oral medication (HCC) 4. Long-term (current) use of injectable non-insulin  antidiabetic drugs See #1 above  5. Hyperlipidemia associated with type 2 diabetes mellitus (HCC) - atorvastatin  (LIPITOR ) 40 MG tablet; Take 1 tablet (40 mg total) by mouth daily at 6 PM.  Dispense: 90 tablet; Refill: 1  6. Hypertension associated with diabetes (HCC) At goal.  Continue Cozaar  25 mg twice a day, hydralazine  100 mg 3 times a day, HCTZ 25 mg daily, Cozaar  100 mg daily - hydrochlorothiazide  (HYDRODIURIL ) 25 MG tablet; Take 1 tablet (25 mg total) by mouth daily.  Dispense: 90 tablet; Refill: 1 - losartan  (COZAAR ) 100 MG tablet; Take 1 tablet (100 mg total) by mouth daily.  Dispense: 90 tablet; Refill: 1  7. Diastolic congestive heart failure, unspecified HF chronicity (HCC) Stable and compensated. Continue  Farxiga , furosemide   - furosemide  (LASIX ) 20 MG tablet; Take 1 tablet (20 mg total) by mouth daily.  Dispense: 90 tablet; Refill: 1   Patient was given the opportunity to ask questions.  Patient verbalized understanding of the plan and was able to repeat key elements of the plan.   This documentation was completed using Paediatric nurse.  Any transcriptional errors are unintentional.  Orders Placed This Encounter  Procedures   POCT glycosylated hemoglobin (Hb A1C)   POCT glucose (manual entry)     Requested Prescriptions   Signed Prescriptions Disp Refills   amLODipine  (NORVASC ) 10 MG tablet 90 tablet 1    Sig: Take 1 tablet (10 mg total) by mouth daily.   atorvastatin  (LIPITOR ) 40 MG tablet 90 tablet  1    Sig: Take 1 tablet (40 mg total) by mouth daily at 6 PM.   furosemide  (LASIX ) 20 MG tablet 90 tablet 1    Sig: Take 1 tablet (20 mg total) by mouth daily.   glipiZIDE  (GLUCOTROL ) 10 MG tablet 180 tablet 1    Sig: Take 1 tablet (10 mg total) by mouth 2 (two) times daily.   hydrochlorothiazide  (HYDRODIURIL ) 25 MG tablet 90 tablet 1    Sig: Take 1 tablet (25 mg total) by mouth daily.   losartan  (COZAAR ) 100 MG tablet 90 tablet 1    Sig: Take 1 tablet (100 mg total) by mouth daily.   Semaglutide , 2 MG/DOSE, 8 MG/3ML SOPN 3 mL 2    Sig: Inject 2 mg as directed once a week.    Return in about 4 months (around 07/21/2024).  Barnie Louder, MD, FACP

## 2024-03-22 NOTE — Telephone Encounter (Signed)
 I printed off paperwork  placed it in your basket

## 2024-04-04 ENCOUNTER — Other Ambulatory Visit: Payer: Self-pay

## 2024-04-05 ENCOUNTER — Other Ambulatory Visit: Payer: Self-pay

## 2024-04-19 ENCOUNTER — Other Ambulatory Visit: Payer: Self-pay

## 2024-04-24 ENCOUNTER — Telehealth: Payer: Self-pay

## 2024-04-24 NOTE — Telephone Encounter (Signed)
 Called & spoke to the patient. 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 No further assistance needed at this time.

## 2024-04-30 ENCOUNTER — Other Ambulatory Visit: Payer: Self-pay

## 2024-05-01 ENCOUNTER — Other Ambulatory Visit: Payer: Self-pay

## 2024-05-02 ENCOUNTER — Other Ambulatory Visit: Payer: Self-pay | Admitting: Internal Medicine

## 2024-05-02 ENCOUNTER — Other Ambulatory Visit: Payer: Self-pay

## 2024-05-02 DIAGNOSIS — E1169 Type 2 diabetes mellitus with other specified complication: Secondary | ICD-10-CM

## 2024-05-02 MED ORDER — OMEPRAZOLE 20 MG PO CPDR
20.0000 mg | DELAYED_RELEASE_CAPSULE | Freq: Every day | ORAL | 1 refills | Status: AC
  Filled 2024-05-02: qty 90, 90d supply, fill #0
  Filled 2024-08-15: qty 90, 90d supply, fill #1

## 2024-05-02 MED ORDER — DEXCOM G7 SENSOR MISC
6 refills | Status: AC
  Filled 2024-05-02: qty 3, 30d supply, fill #0
  Filled 2024-06-11: qty 3, 30d supply, fill #1
  Filled 2024-07-18: qty 3, 30d supply, fill #2
  Filled 2024-09-05 – 2024-09-09 (×3): qty 3, 30d supply, fill #3

## 2024-05-06 ENCOUNTER — Other Ambulatory Visit: Payer: Self-pay

## 2024-05-11 ENCOUNTER — Other Ambulatory Visit: Payer: Self-pay | Admitting: Internal Medicine

## 2024-05-13 ENCOUNTER — Other Ambulatory Visit: Payer: Self-pay

## 2024-05-13 MED ORDER — DAPAGLIFLOZIN PROPANEDIOL 10 MG PO TABS
10.0000 mg | ORAL_TABLET | Freq: Every day | ORAL | 1 refills | Status: AC
  Filled 2024-05-13: qty 90, 90d supply, fill #0
  Filled 2024-08-15: qty 90, 90d supply, fill #1

## 2024-05-17 ENCOUNTER — Other Ambulatory Visit: Payer: Self-pay

## 2024-05-20 ENCOUNTER — Other Ambulatory Visit: Payer: Self-pay

## 2024-06-06 ENCOUNTER — Other Ambulatory Visit: Payer: Self-pay

## 2024-06-11 ENCOUNTER — Other Ambulatory Visit: Payer: Self-pay

## 2024-06-13 ENCOUNTER — Other Ambulatory Visit: Payer: Self-pay

## 2024-06-14 ENCOUNTER — Other Ambulatory Visit: Payer: Self-pay

## 2024-07-18 ENCOUNTER — Other Ambulatory Visit: Payer: Self-pay | Admitting: Internal Medicine

## 2024-07-18 DIAGNOSIS — E1169 Type 2 diabetes mellitus with other specified complication: Secondary | ICD-10-CM

## 2024-07-19 ENCOUNTER — Other Ambulatory Visit: Payer: Self-pay

## 2024-07-21 MED ORDER — OZEMPIC (2 MG/DOSE) 8 MG/3ML ~~LOC~~ SOPN
2.0000 mg | PEN_INJECTOR | SUBCUTANEOUS | 0 refills | Status: DC
  Filled 2024-07-21 – 2024-07-22 (×2): qty 3, 28d supply, fill #0

## 2024-07-22 ENCOUNTER — Other Ambulatory Visit: Payer: Self-pay

## 2024-07-26 ENCOUNTER — Ambulatory Visit: Admitting: Internal Medicine

## 2024-07-30 ENCOUNTER — Other Ambulatory Visit: Payer: Self-pay

## 2024-08-15 ENCOUNTER — Other Ambulatory Visit: Payer: Self-pay | Admitting: Internal Medicine

## 2024-08-15 DIAGNOSIS — E1169 Type 2 diabetes mellitus with other specified complication: Secondary | ICD-10-CM

## 2024-08-15 DIAGNOSIS — E1159 Type 2 diabetes mellitus with other circulatory complications: Secondary | ICD-10-CM

## 2024-08-16 ENCOUNTER — Other Ambulatory Visit: Payer: Self-pay

## 2024-08-17 MED ORDER — METFORMIN HCL 1000 MG PO TABS
1000.0000 mg | ORAL_TABLET | Freq: Two times a day (BID) | ORAL | 0 refills | Status: AC
  Filled 2024-08-17: qty 60, 30d supply, fill #0

## 2024-08-17 MED ORDER — CARVEDILOL 25 MG PO TABS
25.0000 mg | ORAL_TABLET | Freq: Two times a day (BID) | ORAL | 0 refills | Status: AC
  Filled 2024-08-17: qty 90, 45d supply, fill #0

## 2024-08-17 MED ORDER — OZEMPIC (2 MG/DOSE) 8 MG/3ML ~~LOC~~ SOPN
2.0000 mg | PEN_INJECTOR | SUBCUTANEOUS | 0 refills | Status: AC
  Filled 2024-08-17 – 2024-09-09 (×4): qty 3, 28d supply, fill #0

## 2024-08-18 ENCOUNTER — Other Ambulatory Visit: Payer: Self-pay

## 2024-08-19 ENCOUNTER — Other Ambulatory Visit: Payer: Self-pay

## 2024-08-28 ENCOUNTER — Other Ambulatory Visit: Payer: Self-pay

## 2024-08-28 ENCOUNTER — Other Ambulatory Visit: Payer: Self-pay | Admitting: Medical Genetics

## 2024-08-28 ENCOUNTER — Encounter: Payer: Self-pay | Admitting: Internal Medicine

## 2024-09-01 ENCOUNTER — Other Ambulatory Visit: Payer: Self-pay | Admitting: Internal Medicine

## 2024-09-01 DIAGNOSIS — G4733 Obstructive sleep apnea (adult) (pediatric): Secondary | ICD-10-CM

## 2024-09-02 ENCOUNTER — Ambulatory Visit: Admitting: Internal Medicine

## 2024-09-02 ENCOUNTER — Other Ambulatory Visit: Payer: Self-pay

## 2024-09-05 ENCOUNTER — Other Ambulatory Visit: Payer: Self-pay

## 2024-09-05 ENCOUNTER — Other Ambulatory Visit (HOSPITAL_COMMUNITY): Payer: Self-pay

## 2024-09-09 ENCOUNTER — Other Ambulatory Visit: Payer: Self-pay

## 2024-09-09 ENCOUNTER — Other Ambulatory Visit (HOSPITAL_COMMUNITY): Payer: Self-pay

## 2024-09-15 ENCOUNTER — Other Ambulatory Visit (HOSPITAL_COMMUNITY): Payer: Self-pay

## 2024-10-04 ENCOUNTER — Ambulatory Visit: Admitting: Internal Medicine
